# Patient Record
Sex: Male | Born: 1955 | Race: Black or African American | Hispanic: No | Marital: Single | Smoking: Former smoker
Health system: Southern US, Community
[De-identification: ages and names within clinical notes are randomized; demographics above are authoritative.]

## PROBLEM LIST (undated history)

## (undated) DIAGNOSIS — M549 Dorsalgia, unspecified: Secondary | ICD-10-CM

## (undated) DIAGNOSIS — E119 Type 2 diabetes mellitus without complications: Secondary | ICD-10-CM

## (undated) DIAGNOSIS — J189 Pneumonia, unspecified organism: Secondary | ICD-10-CM

## (undated) DIAGNOSIS — Z860100 Personal history of colon polyps, unspecified: Secondary | ICD-10-CM

## (undated) DIAGNOSIS — F319 Bipolar disorder, unspecified: Secondary | ICD-10-CM

## (undated) DIAGNOSIS — K759 Inflammatory liver disease, unspecified: Secondary | ICD-10-CM

## (undated) DIAGNOSIS — E1140 Type 2 diabetes mellitus with diabetic neuropathy, unspecified: Secondary | ICD-10-CM

## (undated) DIAGNOSIS — F329 Major depressive disorder, single episode, unspecified: Secondary | ICD-10-CM

## (undated) DIAGNOSIS — I739 Peripheral vascular disease, unspecified: Secondary | ICD-10-CM

## (undated) DIAGNOSIS — M6282 Rhabdomyolysis: Secondary | ICD-10-CM

## (undated) DIAGNOSIS — R51 Headache: Secondary | ICD-10-CM

## (undated) DIAGNOSIS — M4800 Spinal stenosis, site unspecified: Secondary | ICD-10-CM

## (undated) DIAGNOSIS — N400 Enlarged prostate without lower urinary tract symptoms: Secondary | ICD-10-CM

## (undated) DIAGNOSIS — G8929 Other chronic pain: Secondary | ICD-10-CM

## (undated) DIAGNOSIS — F209 Schizophrenia, unspecified: Secondary | ICD-10-CM

## (undated) DIAGNOSIS — I82409 Acute embolism and thrombosis of unspecified deep veins of unspecified lower extremity: Secondary | ICD-10-CM

## (undated) DIAGNOSIS — F32A Depression, unspecified: Secondary | ICD-10-CM

## (undated) DIAGNOSIS — F419 Anxiety disorder, unspecified: Secondary | ICD-10-CM

## (undated) DIAGNOSIS — R519 Headache, unspecified: Secondary | ICD-10-CM

## (undated) HISTORY — DX: Type 2 diabetes mellitus without complications: E11.9

## (undated) HISTORY — PX: HERNIA REPAIR: SHX51

## (undated) HISTORY — PX: OTHER SURGICAL HISTORY: SHX169

## (undated) HISTORY — PX: COLONOSCOPY: SHX174

## (undated) HISTORY — PX: CARPAL TUNNEL RELEASE: SHX101

## (undated) HISTORY — PX: SHOULDER SURGERY: SHX246

## (undated) HISTORY — DX: Peripheral vascular disease, unspecified: I73.9

## (undated) HISTORY — DX: Acute embolism and thrombosis of unspecified deep veins of unspecified lower extremity: I82.409

## (undated) HISTORY — PX: APPENDECTOMY: SHX54

---

## 2000-02-26 ENCOUNTER — Inpatient Hospital Stay (HOSPITAL_COMMUNITY): Admission: EM | Admit: 2000-02-26 | Discharge: 2000-03-02 | Payer: Self-pay | Admitting: *Deleted

## 2000-05-27 ENCOUNTER — Encounter: Payer: Self-pay | Admitting: Emergency Medicine

## 2000-05-27 ENCOUNTER — Emergency Department (HOSPITAL_COMMUNITY): Admission: EM | Admit: 2000-05-27 | Discharge: 2000-05-27 | Payer: Self-pay | Admitting: Emergency Medicine

## 2000-05-28 ENCOUNTER — Encounter (INDEPENDENT_AMBULATORY_CARE_PROVIDER_SITE_OTHER): Payer: Self-pay | Admitting: *Deleted

## 2000-05-28 ENCOUNTER — Inpatient Hospital Stay (HOSPITAL_COMMUNITY): Admission: EM | Admit: 2000-05-28 | Discharge: 2000-06-04 | Payer: Self-pay | Admitting: Emergency Medicine

## 2000-06-01 ENCOUNTER — Encounter: Payer: Self-pay | Admitting: General Surgery

## 2000-06-02 ENCOUNTER — Encounter: Payer: Self-pay | Admitting: Surgery

## 2005-01-14 ENCOUNTER — Emergency Department (HOSPITAL_COMMUNITY): Admission: EM | Admit: 2005-01-14 | Discharge: 2005-01-14 | Payer: Self-pay | Admitting: Emergency Medicine

## 2005-02-03 ENCOUNTER — Ambulatory Visit: Payer: Self-pay | Admitting: Psychiatry

## 2005-02-03 ENCOUNTER — Inpatient Hospital Stay (HOSPITAL_COMMUNITY): Admission: AD | Admit: 2005-02-03 | Discharge: 2005-02-08 | Payer: Self-pay | Admitting: Psychiatry

## 2005-02-05 ENCOUNTER — Encounter: Payer: Self-pay | Admitting: Psychiatry

## 2005-02-07 ENCOUNTER — Emergency Department (HOSPITAL_COMMUNITY): Admission: EM | Admit: 2005-02-07 | Discharge: 2005-02-07 | Payer: Self-pay | Admitting: Emergency Medicine

## 2005-06-29 ENCOUNTER — Emergency Department (HOSPITAL_COMMUNITY): Admission: EM | Admit: 2005-06-29 | Discharge: 2005-06-29 | Payer: Self-pay | Admitting: Emergency Medicine

## 2005-12-26 ENCOUNTER — Ambulatory Visit: Payer: Self-pay | Admitting: Psychiatry

## 2005-12-26 ENCOUNTER — Inpatient Hospital Stay (HOSPITAL_COMMUNITY): Admission: RE | Admit: 2005-12-26 | Discharge: 2005-12-30 | Payer: Self-pay | Admitting: Psychiatry

## 2006-04-29 ENCOUNTER — Emergency Department (HOSPITAL_COMMUNITY): Admission: EM | Admit: 2006-04-29 | Discharge: 2006-04-29 | Payer: Self-pay | Admitting: Emergency Medicine

## 2006-07-22 ENCOUNTER — Ambulatory Visit: Payer: Self-pay | Admitting: *Deleted

## 2006-07-22 ENCOUNTER — Inpatient Hospital Stay (HOSPITAL_COMMUNITY): Admission: AD | Admit: 2006-07-22 | Discharge: 2006-07-26 | Payer: Self-pay | Admitting: Psychiatry

## 2006-09-21 ENCOUNTER — Emergency Department (HOSPITAL_COMMUNITY): Admission: EM | Admit: 2006-09-21 | Discharge: 2006-09-21 | Payer: Self-pay | Admitting: Emergency Medicine

## 2006-12-02 ENCOUNTER — Emergency Department (HOSPITAL_COMMUNITY): Admission: EM | Admit: 2006-12-02 | Discharge: 2006-12-02 | Payer: Self-pay | Admitting: Family Medicine

## 2006-12-07 ENCOUNTER — Emergency Department (HOSPITAL_COMMUNITY): Admission: EM | Admit: 2006-12-07 | Discharge: 2006-12-07 | Payer: Self-pay | Admitting: Family Medicine

## 2007-04-14 ENCOUNTER — Emergency Department (HOSPITAL_COMMUNITY): Admission: EM | Admit: 2007-04-14 | Discharge: 2007-04-14 | Payer: Self-pay | Admitting: Family Medicine

## 2007-07-19 ENCOUNTER — Emergency Department (HOSPITAL_COMMUNITY): Admission: EM | Admit: 2007-07-19 | Discharge: 2007-07-19 | Payer: Self-pay | Admitting: Emergency Medicine

## 2007-08-02 ENCOUNTER — Ambulatory Visit (HOSPITAL_COMMUNITY): Admission: RE | Admit: 2007-08-02 | Discharge: 2007-08-02 | Payer: Self-pay | Admitting: Family Medicine

## 2007-08-08 ENCOUNTER — Encounter: Admission: RE | Admit: 2007-08-08 | Discharge: 2007-09-05 | Payer: Self-pay | Admitting: Family Medicine

## 2007-08-08 ENCOUNTER — Encounter: Admission: RE | Admit: 2007-08-08 | Discharge: 2007-11-06 | Payer: Self-pay | Admitting: Family Medicine

## 2007-08-23 ENCOUNTER — Ambulatory Visit (HOSPITAL_COMMUNITY): Admission: RE | Admit: 2007-08-23 | Discharge: 2007-08-23 | Payer: Self-pay | Admitting: Family Medicine

## 2007-09-08 ENCOUNTER — Ambulatory Visit: Payer: Self-pay | Admitting: Psychiatry

## 2007-09-08 ENCOUNTER — Inpatient Hospital Stay (HOSPITAL_COMMUNITY): Admission: AD | Admit: 2007-09-08 | Discharge: 2007-09-13 | Payer: Self-pay | Admitting: Psychiatry

## 2007-10-05 ENCOUNTER — Encounter (INDEPENDENT_AMBULATORY_CARE_PROVIDER_SITE_OTHER): Payer: Self-pay | Admitting: Orthopedic Surgery

## 2007-10-05 ENCOUNTER — Inpatient Hospital Stay (HOSPITAL_COMMUNITY): Admission: RE | Admit: 2007-10-05 | Discharge: 2007-10-13 | Payer: Self-pay | Admitting: Orthopedic Surgery

## 2007-10-12 ENCOUNTER — Encounter (INDEPENDENT_AMBULATORY_CARE_PROVIDER_SITE_OTHER): Payer: Self-pay | Admitting: *Deleted

## 2007-10-12 ENCOUNTER — Ambulatory Visit: Payer: Self-pay | Admitting: Cardiology

## 2007-10-12 ENCOUNTER — Ambulatory Visit: Payer: Self-pay | Admitting: Vascular Surgery

## 2007-10-14 ENCOUNTER — Inpatient Hospital Stay (HOSPITAL_COMMUNITY): Admission: EM | Admit: 2007-10-14 | Discharge: 2007-10-20 | Payer: Self-pay | Admitting: Emergency Medicine

## 2007-10-15 ENCOUNTER — Ambulatory Visit: Payer: Self-pay | Admitting: Internal Medicine

## 2007-11-03 ENCOUNTER — Encounter: Payer: Self-pay | Admitting: Infectious Diseases

## 2007-11-08 ENCOUNTER — Ambulatory Visit: Payer: Self-pay | Admitting: Infectious Diseases

## 2007-11-08 DIAGNOSIS — L02419 Cutaneous abscess of limb, unspecified: Secondary | ICD-10-CM

## 2007-11-08 DIAGNOSIS — L03119 Cellulitis of unspecified part of limb: Secondary | ICD-10-CM | POA: Insufficient documentation

## 2007-11-08 LAB — CONVERTED CEMR LAB
AST: 36 units/L (ref 0–37)
Albumin: 4.3 g/dL (ref 3.5–5.2)
Basophils Relative: 1 % (ref 0–1)
CRP: 0.2 mg/dL (ref <0.6)
Calcium: 9.6 mg/dL (ref 8.4–10.5)
Eosinophils Absolute: 0.3 10*3/uL (ref 0.2–0.7)
MCHC: 32.9 g/dL (ref 30.0–36.0)
MCV: 93 fL (ref 78.0–100.0)
Neutrophils Relative %: 43 % (ref 43–77)
Platelets: 189 10*3/uL (ref 150–400)
Sed Rate: 25 mm/hr — ABNORMAL HIGH (ref 0–16)
Sodium: 142 meq/L (ref 135–145)
Total Bilirubin: 0.4 mg/dL (ref 0.3–1.2)
WBC: 7 10*3/uL (ref 4.0–10.5)

## 2007-11-12 ENCOUNTER — Ambulatory Visit (HOSPITAL_COMMUNITY): Admission: RE | Admit: 2007-11-12 | Discharge: 2007-11-12 | Payer: Self-pay | Admitting: Infectious Diseases

## 2007-11-14 ENCOUNTER — Telehealth: Payer: Self-pay

## 2007-11-15 ENCOUNTER — Inpatient Hospital Stay (HOSPITAL_COMMUNITY): Admission: EM | Admit: 2007-11-15 | Discharge: 2007-11-23 | Payer: Self-pay | Admitting: Emergency Medicine

## 2007-11-23 ENCOUNTER — Encounter: Payer: Self-pay | Admitting: Infectious Diseases

## 2007-12-01 HISTORY — PX: CERVICAL SPINE SURGERY: SHX589

## 2007-12-13 ENCOUNTER — Ambulatory Visit: Payer: Self-pay | Admitting: Infectious Diseases

## 2007-12-13 LAB — CONVERTED CEMR LAB
Basophils Relative: 1 % (ref 0–1)
CRP: 0.2 mg/dL (ref <0.6)
Hemoglobin: 14.3 g/dL (ref 13.0–17.0)
Lymphocytes Relative: 45 % (ref 12–46)
MCHC: 33.5 g/dL (ref 30.0–36.0)
Monocytes Absolute: 0.7 10*3/uL (ref 0.1–1.0)
Monocytes Relative: 10 % (ref 3–12)
Neutro Abs: 2.7 10*3/uL (ref 1.7–7.7)
Neutrophils Relative %: 39 % — ABNORMAL LOW (ref 43–77)
RBC: 4.69 M/uL (ref 4.22–5.81)
WBC: 7 10*3/uL (ref 4.0–10.5)

## 2008-01-18 ENCOUNTER — Ambulatory Visit: Payer: Self-pay | Admitting: Infectious Diseases

## 2008-02-20 ENCOUNTER — Telehealth: Payer: Self-pay | Admitting: Infectious Diseases

## 2008-02-21 ENCOUNTER — Emergency Department (HOSPITAL_COMMUNITY): Admission: EM | Admit: 2008-02-21 | Discharge: 2008-02-21 | Payer: Self-pay | Admitting: Emergency Medicine

## 2008-03-21 ENCOUNTER — Telehealth: Payer: Self-pay | Admitting: Infectious Diseases

## 2008-03-28 ENCOUNTER — Ambulatory Visit (HOSPITAL_COMMUNITY): Admission: RE | Admit: 2008-03-28 | Discharge: 2008-03-28 | Payer: Self-pay | Admitting: Infectious Diseases

## 2008-03-29 ENCOUNTER — Telehealth: Payer: Self-pay | Admitting: Infectious Diseases

## 2008-04-12 ENCOUNTER — Encounter: Payer: Self-pay | Admitting: Infectious Diseases

## 2009-03-16 ENCOUNTER — Ambulatory Visit (HOSPITAL_COMMUNITY): Admission: RE | Admit: 2009-03-16 | Discharge: 2009-03-16 | Payer: Self-pay | Admitting: Family Medicine

## 2009-04-15 ENCOUNTER — Inpatient Hospital Stay (HOSPITAL_COMMUNITY): Admission: RE | Admit: 2009-04-15 | Discharge: 2009-04-16 | Payer: Self-pay | Admitting: Neurosurgery

## 2009-05-27 ENCOUNTER — Emergency Department (HOSPITAL_COMMUNITY): Admission: EM | Admit: 2009-05-27 | Discharge: 2009-05-27 | Payer: Self-pay | Admitting: Family Medicine

## 2009-05-30 ENCOUNTER — Other Ambulatory Visit: Payer: Self-pay | Admitting: Emergency Medicine

## 2009-05-31 ENCOUNTER — Inpatient Hospital Stay (HOSPITAL_COMMUNITY): Admission: AD | Admit: 2009-05-31 | Discharge: 2009-06-07 | Payer: Self-pay | Admitting: Psychiatry

## 2009-05-31 ENCOUNTER — Ambulatory Visit: Payer: Self-pay | Admitting: Psychiatry

## 2009-08-19 ENCOUNTER — Encounter: Admission: RE | Admit: 2009-08-19 | Discharge: 2009-08-19 | Payer: Self-pay | Admitting: Orthopedic Surgery

## 2009-09-18 ENCOUNTER — Inpatient Hospital Stay (HOSPITAL_COMMUNITY): Admission: RE | Admit: 2009-09-18 | Discharge: 2009-10-01 | Payer: Self-pay | Admitting: Orthopedic Surgery

## 2009-09-26 ENCOUNTER — Encounter: Payer: Self-pay | Admitting: Orthopedic Surgery

## 2010-04-21 ENCOUNTER — Emergency Department (HOSPITAL_COMMUNITY): Admission: EM | Admit: 2010-04-21 | Discharge: 2010-04-21 | Payer: Self-pay | Admitting: Emergency Medicine

## 2010-12-21 ENCOUNTER — Encounter: Payer: Self-pay | Admitting: Infectious Diseases

## 2010-12-21 ENCOUNTER — Encounter: Payer: Self-pay | Admitting: Psychiatry

## 2011-02-16 LAB — POCT CARDIAC MARKERS
CKMB, poc: <1 ng/mL — ABNORMAL LOW (ref 1.0–8.0)
Myoglobin, poc: 50.5 ng/mL (ref 12–200)
Troponin i, poc: <0.05 ng/mL (ref 0.00–0.09)

## 2011-02-16 LAB — POCT I-STAT, CHEM 8
BUN: 11 mg/dL (ref 6–23)
Creatinine, Ser: 1 mg/dL (ref 0.4–1.5)
Glucose, Bld: 113 mg/dL — ABNORMAL HIGH (ref 70–99)
Potassium: 4.2 mEq/L (ref 3.5–5.1)
Sodium: 139 mEq/L (ref 135–145)

## 2011-02-16 LAB — DIFFERENTIAL
Lymphocytes Relative: 49 % — ABNORMAL HIGH (ref 12–46)
Lymphs Abs: 3.1 10*3/uL (ref 0.7–4.0)
Monocytes Relative: 8 % (ref 3–12)
Neutrophils Relative %: 38 % — ABNORMAL LOW (ref 43–77)

## 2011-02-16 LAB — CBC
HCT: 39.6 % (ref 39.0–52.0)
MCV: 98.4 fL (ref 78.0–100.0)
Platelets: 171 10*3/uL (ref 150–400)
RBC: 4.02 MIL/uL — ABNORMAL LOW (ref 4.22–5.81)
WBC: 6.2 10*3/uL (ref 4.0–10.5)

## 2011-02-24 ENCOUNTER — Other Ambulatory Visit: Payer: Self-pay | Admitting: Orthopedic Surgery

## 2011-02-24 DIAGNOSIS — M545 Low back pain: Secondary | ICD-10-CM

## 2011-02-27 ENCOUNTER — Ambulatory Visit
Admission: RE | Admit: 2011-02-27 | Discharge: 2011-02-27 | Disposition: A | Discharge status: Home / Self Care | Payer: Medicare Other | Source: Ambulatory Visit | Attending: Orthopedic Surgery | Admitting: Orthopedic Surgery

## 2011-02-27 DIAGNOSIS — M545 Low back pain: Secondary | ICD-10-CM

## 2011-03-04 LAB — CBC
Hemoglobin: 9.6 g/dL — ABNORMAL LOW (ref 13.0–17.0)
RBC: 2.78 MIL/uL — ABNORMAL LOW (ref 4.22–5.81)
WBC: 7.6 10*3/uL (ref 4.0–10.5)

## 2011-03-05 LAB — URINALYSIS, ROUTINE W REFLEX MICROSCOPIC
Glucose, UA: NEGATIVE mg/dL
Ketones, ur: NEGATIVE mg/dL
Nitrite: NEGATIVE
Specific Gravity, Urine: 1.02 (ref 1.005–1.030)
pH: 5.5 (ref 5.0–8.0)

## 2011-03-05 LAB — CBC
HCT: 30.2 % — ABNORMAL LOW (ref 39.0–52.0)
HCT: 30.7 % — ABNORMAL LOW (ref 39.0–52.0)
Hemoglobin: 10.4 g/dL — ABNORMAL LOW (ref 13.0–17.0)
Hemoglobin: 10.7 g/dL — ABNORMAL LOW (ref 13.0–17.0)
Hemoglobin: 11.3 g/dL — ABNORMAL LOW (ref 13.0–17.0)
MCHC: 34.4 g/dL (ref 30.0–36.0)
MCHC: 34.5 g/dL (ref 30.0–36.0)
MCHC: 35.1 g/dL (ref 30.0–36.0)
MCV: 100 fL (ref 78.0–100.0)
MCV: 102.5 fL — ABNORMAL HIGH (ref 78.0–100.0)
MCV: 102.7 fL — ABNORMAL HIGH (ref 78.0–100.0)
Platelets: 146 10*3/uL — ABNORMAL LOW (ref 150–400)
Platelets: 181 10*3/uL (ref 150–400)
RBC: 3.07 MIL/uL — ABNORMAL LOW (ref 4.22–5.81)
RBC: 3.13 MIL/uL — ABNORMAL LOW (ref 4.22–5.81)
RBC: 3.36 MIL/uL — ABNORMAL LOW (ref 4.22–5.81)
RBC: 4.02 MIL/uL — ABNORMAL LOW (ref 4.22–5.81)
RDW: 13 % (ref 11.5–15.5)
WBC: 10.8 10*3/uL — ABNORMAL HIGH (ref 4.0–10.5)
WBC: 6 10*3/uL (ref 4.0–10.5)
WBC: 6.7 10*3/uL (ref 4.0–10.5)
WBC: 8.5 10*3/uL (ref 4.0–10.5)

## 2011-03-05 LAB — COMPREHENSIVE METABOLIC PANEL
ALT: 87 U/L — ABNORMAL HIGH (ref 0–53)
AST: 43 U/L — ABNORMAL HIGH (ref 0–37)
AST: 83 U/L — ABNORMAL HIGH (ref 0–37)
Albumin: 3.6 g/dL (ref 3.5–5.2)
BUN: 7 mg/dL (ref 6–23)
CO2: 28 mEq/L (ref 19–32)
CO2: 32 mEq/L (ref 19–32)
Calcium: 9.3 mg/dL (ref 8.4–10.5)
Chloride: 104 mEq/L (ref 96–112)
Chloride: 99 mEq/L (ref 96–112)
Creatinine, Ser: 0.84 mg/dL (ref 0.4–1.5)
GFR calc Af Amer: >60 mL/min (ref >60)
GFR calc Af Amer: >60 mL/min (ref >60)
GFR calc non Af Amer: >60 mL/min (ref >60)
GFR calc non Af Amer: >60 mL/min (ref >60)
Glucose, Bld: 118 mg/dL — ABNORMAL HIGH (ref 70–99)
Sodium: 139 mEq/L (ref 135–145)
Total Bilirubin: 0.7 mg/dL (ref 0.3–1.2)

## 2011-03-05 LAB — BASIC METABOLIC PANEL
BUN: 3 mg/dL — ABNORMAL LOW (ref 6–23)
CO2: 30 mEq/L (ref 19–32)
Calcium: 8 mg/dL — ABNORMAL LOW (ref 8.4–10.5)
Chloride: 104 mEq/L (ref 96–112)
Creatinine, Ser: 0.76 mg/dL (ref 0.4–1.5)
Creatinine, Ser: 0.8 mg/dL (ref 0.4–1.5)
GFR calc Af Amer: >60 mL/min (ref >60)
GFR calc Af Amer: >60 mL/min (ref >60)
GFR calc non Af Amer: >60 mL/min (ref >60)
Sodium: 139 mEq/L (ref 135–145)

## 2011-03-05 LAB — WOUND CULTURE

## 2011-03-05 LAB — DIFFERENTIAL
Basophils Absolute: 0 10*3/uL (ref 0.0–0.1)
Eosinophils Absolute: 0.2 10*3/uL (ref 0.0–0.7)
Eosinophils Relative: 3 % (ref 0–5)
Eosinophils Relative: 3 % (ref 0–5)
Lymphocytes Relative: 31 % (ref 12–46)
Lymphs Abs: 2.3 10*3/uL (ref 0.7–4.0)
Monocytes Absolute: 0.5 10*3/uL (ref 0.1–1.0)
Neutrophils Relative %: 49 % (ref 43–77)

## 2011-03-05 LAB — PROTIME-INR
Prothrombin Time: 12.6 seconds (ref 11.6–15.2)
Prothrombin Time: 13.9 seconds (ref 11.6–15.2)

## 2011-03-05 LAB — URINALYSIS, MICROSCOPIC ONLY
Glucose, UA: NEGATIVE mg/dL
Hgb urine dipstick: NEGATIVE
Ketones, ur: 15 mg/dL — AB
Leukocytes, UA: NEGATIVE
Protein, ur: NEGATIVE mg/dL

## 2011-03-05 LAB — TYPE AND SCREEN
ABO/RH(D): B POS
Antibody Screen: NEGATIVE

## 2011-03-05 LAB — ANAEROBIC CULTURE

## 2011-03-05 LAB — VITAMIN D 25 HYDROXY (VIT D DEFICIENCY, FRACTURES): Vit D, 25-Hydroxy: 21 ng/mL — ABNORMAL LOW (ref 30–89)

## 2011-03-08 LAB — RAPID URINE DRUG SCREEN, HOSP PERFORMED
Amphetamines: NOT DETECTED
Barbiturates: NOT DETECTED
Benzodiazepines: POSITIVE — AB
Opiates: POSITIVE — AB
Tetrahydrocannabinol: NOT DETECTED

## 2011-03-08 LAB — BASIC METABOLIC PANEL
CO2: 30 mEq/L (ref 19–32)
Calcium: 9.1 mg/dL (ref 8.4–10.5)
Creatinine, Ser: 0.82 mg/dL (ref 0.4–1.5)
GFR calc non Af Amer: >60 mL/min (ref >60)
Glucose, Bld: 119 mg/dL — ABNORMAL HIGH (ref 70–99)

## 2011-03-08 LAB — GLUCOSE, CAPILLARY
Glucose-Capillary: 121 mg/dL — ABNORMAL HIGH (ref 70–99)
Glucose-Capillary: 123 mg/dL — ABNORMAL HIGH (ref 70–99)
Glucose-Capillary: 155 mg/dL — ABNORMAL HIGH (ref 70–99)
Glucose-Capillary: 182 mg/dL — ABNORMAL HIGH (ref 70–99)
Glucose-Capillary: 194 mg/dL — ABNORMAL HIGH (ref 70–99)
Glucose-Capillary: 197 mg/dL — ABNORMAL HIGH (ref 70–99)

## 2011-03-08 LAB — CBC
MCHC: 34.4 g/dL (ref 30.0–36.0)
Platelets: 202 10*3/uL (ref 150–400)
RDW: 11.9 % (ref 11.5–15.5)

## 2011-03-08 LAB — DIFFERENTIAL
Basophils Absolute: 0 10*3/uL (ref 0.0–0.1)
Basophils Relative: 1 % (ref 0–1)
Lymphocytes Relative: 48 % — ABNORMAL HIGH (ref 12–46)
Neutro Abs: 2.1 10*3/uL (ref 1.7–7.7)
Neutrophils Relative %: 35 % — ABNORMAL LOW (ref 43–77)

## 2011-03-10 LAB — COMPREHENSIVE METABOLIC PANEL
ALT: 70 U/L — ABNORMAL HIGH (ref 0–53)
Alkaline Phosphatase: 92 U/L (ref 39–117)
CO2: 27 mEq/L (ref 19–32)
Chloride: 105 mEq/L (ref 96–112)
GFR calc non Af Amer: >60 mL/min (ref >60)
Glucose, Bld: 103 mg/dL — ABNORMAL HIGH (ref 70–99)
Potassium: 3.8 mEq/L (ref 3.5–5.1)
Sodium: 140 mEq/L (ref 135–145)
Total Bilirubin: 0.8 mg/dL (ref 0.3–1.2)

## 2011-03-10 LAB — CBC
Hemoglobin: 14.7 g/dL (ref 13.0–17.0)
MCV: 100.1 fL — ABNORMAL HIGH (ref 78.0–100.0)
RBC: 4.17 MIL/uL — ABNORMAL LOW (ref 4.22–5.81)
WBC: 6.3 10*3/uL (ref 4.0–10.5)

## 2011-04-14 NOTE — Op Note (Signed)
NAMEDUC, CROCKET NO.:  [id number]   MEDICAL RECORD NO.:  [id number]          PATIENT TYPE:  INP   LOCATION:  3537                         FACILITY:  [facility]   PHYSICIAN:  Cristi Loron, M.D.DATE OF BIRTH:  1956/07/04   DATE OF PROCEDURE:  04/15/2009  DATE OF DISCHARGE:  04/16/2009                               OPERATIVE REPORT   BRIEF HISTORY:  The patient is a 55 year old black male who suffered  from neck, right shoulder, and arm pain consistent with a right C5-C6  radiculopathy.  He failed medical management and worked up with a  cervical MRI which demonstrated, he had herniated disk/spondylolisthesis  at C4-5, and C5-6.  I discussed various treatment options with the  patient including surgery.  He has weighed the risks, benefits, and  alternatives of the surgery and decided to proceed with the C4-5 and C5-  6 anterior cervical diskectomy and fusion with plating.   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   POSTOPERATIVE DIAGNOSES:  C4-5 and C5-6 spondylosis,s stenosis, cervical  radiculopathy/ bilateral cervicalgia.   PROCEDURE:  C4-5 and C5-6 extensive anterior cervical  discectomy/decompression; C4-5 and C5-6 anterior interbody arthrodesis  with local morcellized autograft bone and Actifuse bone graft extender;  insertion of C4-5 and C5-6 interbody prosthesis (Novel PEEK interbody  prosthesis); C4, C6 anterior cervical plating with Codman SLIM-LOC  titanium plate and screws.   SURGEON:  Cristi Loron, MD.   ASSISTANT:  Stefani Dama, MD.   ANESTHESIA:  General endotracheal.   ESTIMATED BLOOD LOSS:  100 mL.   SPECIMENS:  None.   DRAINS:  None.   COMPLICATIONS:  None.   DESCRIPTION OF PROCEDURE:  The patient was brought to the operating room  by the Anesthesia team.  General endotracheal anesthesia was induced.  The patient  remained in supine position.  A roll  was placed under his  shoulder placing his neck in slight extension.  The anterior cervical  region was then prepared with Betadine scrub and Betadine solution, and  sterile drapes were applied.  I then injected the area to be incised  with Marcaine with epinephrine solution.  I  used a scalpel to make a  transverse incision in the patient's left anterior neck.  I used the  Metzenbaum scissors to divide the platysma muscle and then to dissect  medial to sternocleidomastoid muscle, jugular vein, and carotid artery.  I carefully dissected down towards the anterior cervical spine,  identifying the esophagus and retracting medially.  I then used a  Kittner swabs to clear the soft tissue from the anterior cervical spine  and inserted a bent spinal needle into the exposed intervertebral disk  space.  We obtained intraoperative radiograph to confirm our location.   We then used electrocautery to detach the medial border of the longus  colli muscle bilaterally from C4-5, C5-6 intervertebral disk spaces.  We  then inserted the Caspar self-retaining retractor underneath the longus  colli muscle bilaterally at C4-5, C5-6 to provide exposure.  We began  decompression at C4-5.  We incised  the C4-5 intervertebral disk with a  15-blade scalpel.  We performed a partial intervertebral diskectomy with  pituitary forceps.  We then inserted distraction screws at C4, C5, and  distracted the interspace.  We then used the high-speed drill to  decorticate the vertebral endplates at C4-5 through drill away the  remainder of C4-5 intervertebral disk drill away some posterior  spondylosis and to thin out the posterior longitudinal ligament.  We  incised the thinned out ligament with arachnoid knife and removed it  with Kerrison punch undercutting the vertebral endplates and  decompressing the thecal sac.  We then performed right foraminotomies  about the bilateral C5 nerve root, completing the decompression at  this  level.   We then repeated this procedure in an analogous fashion at C5-6  decompressing at the and C5-6 thecal sac and bilateral C6 nerve roots.   Now, we completed the decompression.  We now turned our attention to the  arthrodesis.  We used trial spacers and determined to use a 6-mm medium  PEEK interbody prosthesis.  We prefilled these prosthesis with a  combination of local morselized autograft bone and Actifuse bone graft  extender.  We then inserted the prostheses distracted interspaces at C4-  5 and C5-6.  There was a good snug fit of the prosthesis at both levels.   We now turned our attention to the anterior spinal instrumentation.  We  used a high-speed drill to remove some better spondylosis from the  vertebral endplates C4-5, C5-6, so that the plate will lay down flat.  We selected an appropriate length of Codman SLIM-LOC anterior cervical  plate, we  laid it along the anterior aspect of the vertebral bodies  from C4-5.  We then drilled two 14-mm holes at C4, C5, and C6.  e  secured the plate to their vertebral bodies by placing two14-mm self-  tapping screws at C4-5, C5-6.  We then obtained an intraoperative  radiograph, which demonstrated good position of the upper plate screws  and interbody prosthesis. We then secured the screws and plate by  locking the cam.   We then obtained hemostasis using bipolar electrocautery.  We irrigated  the wound out with bacitracin solution.  We removed the retractors.  We  then inspected the esophagus for any damage; there was none apparent.  We then reapproximate the patient's platysma muscle with interrupted 3-0  Vicryl suture, subcutaneous tissue with interrupted 3-0 Vicryl suture,  and the skin with Steri-Strips and Benzoin.  The wound was then coated  with bacitracin ointment and sterile dressing was applied.  The drapes  were removed and the patient was subsequently extubated by the  anesthesia team and transported to  postanesthesia care unit in stable  condition.  All sponge, instrument, and needle counts were correct at  the end of the case.      Cristi Loron, M.D.  Electronically Signed     JDJ/MEDQ  D:  04/15/2009  T:  04/16/2009  Job:  [id number]

## 2011-04-14 NOTE — Op Note (Signed)
NAMEHILARIO, [name] NO.:  [id number]   MEDICAL RECORD NO.:  [id number]          PATIENT TYPE:  INP   LOCATION:  5020                         FACILITY:  [facility]   PHYSICIAN:  Nelda Severe, MD      DATE OF BIRTH:  08-09-1956   DATE OF PROCEDURE:  11/15/2007  DATE OF DISCHARGE:                               OPERATIVE REPORT   SURGEON:  Nelda Severe, M.D.   ASSISTANT:  OR staff.   PREOPERATIVE DIAGNOSIS:  Possible wound abscess, right posterior iliac  crest graft wound.   POSTOPERATIVE DIAGNOSIS:  Wound seroma, right posterior iliac crest  wound - infection unlikely.   The patient was placed under general endotracheal anesthesia.  No  antibiotics were given until intraoperative cultures were taken.  The  patient was positioned prone on the [place] frame.  Care was taken in  positioning the upper extremity so as to avoid hyperabduction and  flexion of the shoulders and so as to avoid hyperflexion of the elbows.  The upper extremity was padded with foam from axilla to hands  bilaterally.  The thighs, knees, shins, and ankles were supported on  pillows.  The previous bone graft harvest site incision was marked with  a skin marker.  The skin was prepped with Betadine.  Drapes were applied  in square fashion and secured with Ioban.   The previous skin incision was excised elliptically into the  subcutaneous tissue.  Dissection was carried into the subcutaneous layer  until the posterior iliac crest was identified.  I then with a finger  created a defect at the attachment of the gluteal fascia to the iliac  crest, and serous looking fluid escaped.  About 5-7 mL of this fluid was  sucked up with 10 mL syringe and is being submitted for a white cell  count.   We further detached the gluteal fascia and suctioned out the contents of  the wound which looked very innocuous indeed.  There was a small amount  of fibrinous exudate.  The bony surface was curetted.  A small  amount of  tissue was submitted for histology, probably not quick section.  We also  sent culture swabs, aerobic and anaerobic, from the depths of the wound  as well as a small amount of tissue was placed in two other culture  viles for aerobic and anaerobic culture.  Subsequent to taking the  cultures, the patient was started on vancomycin and Zosyn per the  request of Dr. Orvan Falconer, infectious disease consultant seeing the  patient.   We then irrigated 3 liters of normal saline using pulsatile lavage  through the wound.  A 15-gauge Blake drain was placed subfascially and  brought out through the skin proximally and secured with a 2-0 nylon  suture in basket weave fashion.  The gluteal fascia was then reapposed  to the iliac crest using two figure-of-eight #1 Vicryl sutures.  The  subcutaneous tissue was closed using a combination of interrupted and  continuous 2-0 Vicryl sutures.  The skin was closed with staples.  A  bacitracin ointment dressing was then  applied and secured with tape.  There were no  intraoperative complications.  The sponge and needle counts were  correct.  Blood loss was estimated at about 100 mL.   The patient was awakened and brought to the recovery room where he is  awake and moving all extremities.      Nelda Severe, MD  Electronically Signed     MT/MEDQ  D:  11/17/2007  T:  11/18/2007  Job:  [id number]

## 2011-04-14 NOTE — Consult Note (Signed)
NAMEJERIKO, [name] NO.:  [id number]   MEDICAL RECORD NO.:  [id number]          PATIENT TYPE:  INP   LOCATION:  5028                         FACILITY:  [facility]   PHYSICIAN:  Richard Sell, MD DATE OF BIRTH:  [birth year]   DATE OF CONSULTATION:  10/14/2007  DATE OF DISCHARGE:                                 CONSULTATION   REFERRING PHYSICIAN:  Nelda Severe, MD   REASON FOR CONSULTATION:  Thigh abscesses post lumbar fusion procedure  on October 05, 2007.   HISTORY OF PRESENT ILLNESS:  This is a pleasant 55 year old African  American male with a history of medication induced diabetes as well as  bipolar disorder who underwent posterior lumbar fusion with laminectomy  and iliac crest bone graft by Dr. Nelda Owen on October 05, 2007.  He  apparently did relatively well postoperatively but did have fevers and a  mild leucocytosis which was attributed to a possible pneumonia and  treated with vancomycin and moxifloxacin and then discharged on p.o.  moxifloxacin.  He was just discharged yesterday but reports that after  he went home, he started to have increasing pain in his right thigh.  Got to the point where this morning he could not get out of bed due to  the pain and weakness.  He also reports fevers last night, although he  did not take his temperatures.  He denies any shaking, chills or night  sweats.  He denies any chest pain, shortness of breath or cough.  Prior  to surgery, he says he was in his usual state of health and was able to  walk relatively well.  His initial need for this surgery was due to a  fall in August 2008.   Patient was seen in the emergency room and had a CT of his thigh which  showed some abscesses.  He then had an MRI which showed multiple small  abscesses.  He was not given any antibiotics since admission but was  discharged yesterday on moxifloxacin, which he presumably was still  taking.  He denies ever having any other  infection such as this.   PAST MEDICAL HISTORY:  1. Drug induced diabetes, which is relatively well controlled with the      most recent hemoglobin A1c of 6.5.  2. Bipolar.  3. Status post appendectomy.  4. Status post surgical fixation of his right shoulder following a      football injury many years ago.   SOCIAL HISTORY:  Patient lives with is wife.  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  He has two children.   FAMILY HISTORY:  Noncontributory.   MEDICATION AS AN OUTPATIENT:  He was discharged yesterday on Norco,  Robaxin, Avelox, Glucotrol, Lasix.   ALLERGIES:  THE PATIENT IS ALLERGIC TO PENICILLIN, SAYS HE GETS A RASH  FROM IT, BUT DOES NOT HAVE ANAPHYLAXIS.   PHYSICAL EXAMINATION:  VITAL SIGNS:  Patient is afebrile at this point  with a temperature of 97.8.  reviewing his data from his last  hospitalization, he was last febrile on November  11 at 100.2.  Prior to  that, he had temperatures up to 101.9 on November 7, 8 and 9.  Currently  his blood pressure is 111/63, heart rate of 103 to 95, respirations of  20, saturating 98% on room air.  GENERAL:  He is a well appearing African American gentleman in no acute  distress.  He does seem to be resting on his left side so as to not put  pressure on his right hip.  HEENT:  His pupils are equal, round and reactive to light and  accommodation.  Extraocular movements are intact.  Sclerae are  anicteric.  Oropharynx clear.  There is no thrush noted.  NECK:  Supple.  HEART:  Regular.  LUNGS:  Clear to auscultation bilaterally.  ABDOMEN:  Soft, nontender, nondistended, no hepatosplenomegaly.  EXTREMITIES:  He does have some induration and swelling over his right  lateral thigh.  There is some warmth but no spreading erythema.  It is  tender to deep palpation and tender with range of motion.  He has on his  right buttock approximately a 5-cm incision which is draining some  serosanguineous slightly yellowish fluid, which was  swabbed.  He has a  well approximated, well healing incision in his midline back.  He as 1+  edema on his right lower extremity.  MUSCULOSKELETAL:  His joints are without effusion, warmth or tenderness.   DATA:  Patient had a white count of 10.8, hemoglobin 9.8, platelets 423.  His A1c is 6.5.  His BUN is 7.  Creatinine 0.82.  LFTs are relatively  within normal limits with a slight increase in his AST and ALT.  Blood  culture done November 7 and November 8 are negative during culture.  November 5 are negative.   He had imaging done of his right thigh with a CT scan done in the  emergency room last night that revealed an abscess in the right thigh.  This was approximately 2.5 x 1.3 x 5.9 cm in the right gluteus medius  muscle.  There was no evidence of a DVT on this.  He then had an MRI  done, the report of which is not available but which apparently showed  multiple small abscesses tracking along the thigh.   ASSESSMENT:  This is a pleasant 55 year old gentleman with a past  medical history of drug induced diabetes from Seroquel as well as  bipolar disorder who has surgery November 5 on his lumbar spine with a  harvest from his right iliac crest for bone grafting.  He did relatively  well postoperatively, although he did have high fevers to 101.9 and was  treated with vancomycin and Avelox.  He was discharged yesterday on  Avelox and now presents with right thigh pain and his found to have  multiple small abscesses.  There is also some drainage from his right  iliac crest bone harvest site.  I think this is most likely a contiguous  infection, although the MRI does not necessarily confirm that there may  be some tracking along fascial planes.  I have swabbed his incision site  and sent it for culture.  He is going now for VIR ultrasound guided  aspirate of one of the abscesses.   My recommendations would be:  1. Send aspirate for anaerobic and aerobic culture.  2. Start him  empirically on vancomycin and imipenem as he has a      penicillin allergy which is a rash.  Based on followup of the  culture, we can narrow antibiotics.  3. He will likely need repeat imaging at some point after a period of      antibiotics in order to demonstrate resolution of these abscesses.  4. If the patient is febrile, I would recommend blood cultures of him      as well as UA and urine culture and chest x-ray, although he is not      febrile at this time.   Thank you for the consult.  We would be glad to follow along with you.  He will be seen by Dr. Orvan Falconer over the weekend.      Richard Sell, MD  Electronically Signed     DPF/MEDQ  D:  10/14/2007  T:  10/15/2007  Job:  [id number]

## 2011-04-14 NOTE — Consult Note (Signed)
NAMEJENNINGS, [name] NO.:  [id number]   MEDICAL RECORD NO.:  [id number]          PATIENT TYPE:  INP   LOCATION:  5020                         FACILITY:  [facility]   PHYSICIAN:  Jamison Neighbor, M.D.  DATE OF BIRTH:  08-Oct-1956   DATE OF CONSULTATION:  11/20/2007  DATE OF DISCHARGE:                                 CONSULTATION   REASON FOR CONSULTATION:  Bladder outlet symptoms.   HISTORY:  This 55 year old black male was admitted to the hospital on  November 15, 2007, for the treatment of a wound abscess involving a  right posterior iliac crest graft wound.  The patient was taken to the  operating room where it turned out that this was most likely a seroma.  The patient has been in the hospital receiving wound care including  antibiotic therapy.  The patient notes that he has been having some  problems with frequency of urination and requested urologic  consultation.   The patient notes that recently he has developed some problems with  initiation of urination.  He says he has a hard time getting his urinary  stream to start.  He does have some hesitancy as well as some double  voiding.  He has decrease in the force of his stream and some dribbling.  There is nocturia.  There is no dysuria, hematuria, or documented  infections.   The patient's past urologic history is unremarkable.  He has not had  previous urologic evaluation.  He says prior to this point he really did  not have a lot of urologic problems.  He feels that this did come on him  somewhat quickly, but says that he certainly does agree that it is  possible that this may have been building for a little bit longer than  he realized.   PAST MEDICAL HISTORY:  Remarkable for bipolar disorder, diabetes, and  hepatitis C.   MEDICATIONS:  1. Depakote ER 500 mg.  2. Glipizide XL 10 mg.  3. Dilaudid.  4. Norco for postoperative pain.  5. Zosyn.  6. He also has received Vancomycin as recommendation per  infectious      disease.   SOCIAL HISTORY:  Pertinent for tobacco use.   FAMILY HISTORY:   REVIEW OF SYSTEMS:  Noncontributory.   PHYSICAL EXAMINATION:  GENERAL:  He is a well-developed, well-nourished,  male in no acute distress.  He is alert and oriented x3.  HEENT:  Normocephalic and atraumatic.  Cranial nerves II-XII grossly  intact.  LUNGS:  Respirations are unlabored.  ABDOMEN:  Soft and nontender with no palpable masses, rebound, or  guarding.  He has a small umbilical hernia which is a little bit tender  and I did recommend he consider seeing general surgery to have that  fixed.  GENITOURINARY:  Normal testicles bilaterally with no hydrocele,  somatocele, varicocele, hernia, or adenopathy.  Penis is free of any  lesions.  Meatus is normal.  EXTREMITIES:  No cyanosis, clubbing, or edema.  NEUROLOGY:  Appears to be grossly intact.  The patient does have an  abscessed area on the  right hip that is healing and is currently  dressed.  I did not take the dressing down.  RECTAL:  2+ prostate without evidence of active prostatitis.   I am going to start him on Flomax 0.4 mg p.o. q.h.s.  I told him it will  take a week or two for this to start to work.  He will need evaluation  with cystoscopy and urodynamics in my office and we can see him back in  follow-up about two weeks post discharge.      Jamison Neighbor, M.D.  Electronically Signed     RJE/MEDQ  D:  11/20/2007  T:  11/21/2007  Job:  [id number]

## 2011-04-14 NOTE — Op Note (Signed)
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   PROCEDURES:  1. Transforaminal lumbar interbody fusion (L4-5)  2. Posterolateral fusion L4-5.  3. Pedicle screw instrumentation L4-5.  4. Insertion of interbody cage L4-5.  5. Harvest posterior iliac crest graft right side.   SURGEON:  Nelda Severe, MD.   ASSISTANT:  Lianne Cure, P.A.C.   DESCRIPTION OF PROCEDURE:  Patient was placed under general endotracheal  anesthesia.  Foley catheter was inserted in the bladder.  Electrodes for  neurologic monitoring were attached to the upper and lower extremities  on the scalp.  Patient was positioned prone on a Jackson frame.  Care  was taken to avoid hyperabduction of the shoulders and hyperflexion of  the shoulders as well as hyperflexion of the elbows.  The upper  extremities were padded with foam from axillae to hands.  The hips were  relatively extended and the thighs, knee, shins and feet supported on  pillows.  Hair was clipped from the lumbar area.  The lumbar area was  prepped with DuraPrep and draped in rectangular fashion.  The drapes  were secured with Ioban.  Time out was performed to positively identify  the patient and verify the procedure as outlined above.  Midline  incision was made into the dermis.  The subcutaneous tissue and  paraspinal fascia was injected with a mixture of 25% Marcaine plain,  with 1% lidocaine with epinephrine.  Dissection was carried down with  cutting current onto the spinous processes.   Paraspinal muscles were  mobilized bilaterally.  Cross table lateral radiograph was taken with a  Kocher on the L4 spinous process.  This conformed to our impression of  the level at which we were.  Exposure was laterally to the tips of the  transverse process at L4 and L5.   Pedicle holes were created bilaterally at L4 and L5 as follows:  A piece  of the base of the superior articular process was removed with a Leksell  rongeur, the pedicle was perforated with an awl, a hole was made through  the pedicle into the vertebral body with a 3.5 mm drill bit, the hole  was carefully probed with a ball-tip probe to make sure that it was  circumferentially intact, the hole was sounded for depth and the depth  recorded, the hole was injected with Floseal and a radiopaque marker  placed.  Subsequently, we ordered a radiograph and then turned our  attention to harvesting a bone graft in the right posterior iliac crest.   An incision was made just lateral to the right posterior iliac crest.  It was carried down to the gluteal fascia.  The gluteal fascia was  detached from the outer table of the ileum and the muscle elevated off  the outer table of the ileum.  A Taylor retractor was placed.  A  moderate quantity of bone graft was harvested using acetabular reamer as  well as gouges.  Raw bleeding bone was then smeared with Gelfoam to  control bleeding.  The gluteal fascia was then reattached to the lumbar  fascia and iliac crest using three #1 Vicryl sutures in figure-of-eight  fashion.   By this time, the radiograph had been performed and the image was  displayed.  It showed satisfactory position of the radiopaque markers.  We then placed the screws bilaterally at L4 and L5.  Screws were all  stimulated and distal EMG activity recorded, in every instance at a safe  level, meaning that there is unlikely contact between a screw thread and  nerve root.   Prior to placing the screws on either side,  the transverse processes  were decorticated and cancellous packed posterolaterally.   Subsequent to the screw placement, we then performed a right-sided  superior and inferior facetectomy at L4-5 and removed ligamentum flavum  to expose the dura and descending L5 nerve root.  The exiting L4 nerve  root was protected with a cottonoid patty and subsequently a D'Errico  retractor and the proximal portion of the L5 nerve root retracted  medially with a Love nerve root retractor.  This is being done on the  right side.  The disk was fenestrated and then a series of disk scrapers  inserted to loosen up and deliver degenerate nucleus.  This was  delivered with pituitary rongeurs.  Subsequently we put a 12 mm  intradiskal distractor in place and attached provisionally a working  rod between L4 and 5 to hold the interspace distracted.  Subsequently,  it was further distracted using an external distractor between the  spinous processes of L4 and 5.   Thorough curettage of the disk space was carried out.  When I felt the  disk space had been adequately prepared, we packed cancellous bone graft  anteriorly.  We then loaded a TLIF implant, 12 mm in thickness, with  bone graft and inserted it without difficulty transversely in the disk  space.  Distraction was then let off.   I then prepared the lamina at L4 and L5 on the left side for bone  grafting and resected the articular surfaces of the L4-5 facet joint on  the left side.  Bone graft was then packed into this area including  locally harvested graft from the right-sided laminectomy and  facetectomy.  Bone graft that was left over was then packed  posterolaterally on the right side at L4-5.   Cross table lateral radiographs showed satisfactory position of  implants.  The construct was compressed on either side and the set  screws tightened and eventually torqued.  We checked to make sure that  the rod was protruding satisfactorily beyond the  coupling at each level,  that there was adequate purchase of the rod.   We then placed a 15-gauge Blake drain subfascially and brought it out  through the skin to the right side.  This Blake drain was secured with a  2-0 nylon suture in basket-weave fashion.  The lumbar fascia was then  closed using continuous #1 Vicryl suture.  We then placed a one inch  Hemovac drain from the central lumbar wound,  out through the bone graft  harvest wound to the right side through the skin proximally.  This drain  was also secured with a 2-0 nylon suture in basket-weave fashion.  The  subcutaneous layer of both wounds was closed using interrupted inverted  2-0 Vicryl suture.  The skin of both wounds was closed using continuous  subcuticular 3-0 undyed Vicryl suture.  The skin edges were then  reinforced with Steri-Strips.  A nonadherent dressing was then applied  to both wounds.   The patient was then transferred to his bed and awakened and transferred  to the recovery room.   In the recovery room, he was able to actively dorsiflex and plantar flex  both feet and ankles.   There were no intraoperative complications.  Sponge and needle counts  were correct.  Blood loss estimated at 250 mL.   Not mentioned above, is that toward the end of the procedure we  attempted to inject intrathecally at L3-4 and at L4-5 preservative-free  morphine, but at neither level was I able to get efflux of spinal fluid,  so the injection was not performed and the medication was wasted and I  believe this was recorded by the nurses.      Nelda Severe, MD  Electronically Signed     MT/MEDQ  D:  10/05/2007  T:  10/05/2007  Job:  [id number]

## 2011-04-14 NOTE — H&P (Signed)
NAMERICKI, CLACK NO.:  [id number]   MEDICAL RECORD NO.:  [id number]          PATIENT TYPE:  IPS   LOCATION:  0407                          [facility]ACILITY[facility]  [facility]   PHYSICIAN:  Anselm Jungling, MD  DATE OF BIRTH:  [birth year]   DATE OF ADMISSION:  09/08/2007  DATE OF DISCHARGE:                       PSYCHIATRIC ADMISSION ASSESSMENT   IDENTIFYING INFORMATION:  This is a 55 year old African-American male  who is single.  This is a voluntary admission.   HISTORY OF PRESENT ILLNESS:  This patient was referred by [facility] where he presented complaining of some increase in  auditory hallucinations over the course of about one week, having some  command hallucinations along with some increasing irritability and  worsening of mood.  His sleep was decreased to 3-4 hours at a time which  is unlike his usual six hours per night.  e had also been experiencing  some paranoia, guarding, beginning to feel suspicious about people.  Two  days prior to admission, he had met with his psychiatrist, Dr. Hortencia Pilar,  who he reports was discontinuing his Abilify and going to taper down his  Depakote in favor of using Seroquel as a sole medication therapy.  He  also reports some increased difficulties since July with some chronic  low back pain after stepping into a hole and wrenching his right knee  and lower back.  Today, he is alert, oriented, pleasant and cooperative  and having some low, almost inaudible, hallucinations today with no  commands.   PAST PSYCHIATRIC HISTORY:  The patient is followed at [facility] by Dr. Homero Fellers and [facility].  He has a history  of schizoaffective disorder, bipolar-type, and history of cocaine  dependence and he reports now abstinent from cocaine less than a year  but cannot specify the number of months.  This is his third admission to  [facility].  Prior admissions here under  the  service of Dr. Aleatha Borer February 03, 2005 to February 08, 2005.  He also  has a history of prior admissions to [facility].  Does have a  history of mania in the distant past.  Reports he has not had a manic  episode in more than two years at this point.  He endorses drinking some  alcohol, last drank one beer two weeks ago.  Medications in the past  include Abilify, Depakote, Benadryl, Lithobid, Risperdal and Cogentin in  managing his schizoaffective disorder.   SOCIAL HISTORY:  Single African-American male in no distress.  Currently  living alone in a house that his mother left him.  He is satisfied with  his living situation.  He is alone but does have a girlfriend in a  stable relationship.  He has Medicaid and Medicare as resources to pay  for care.  He is unemployed and on disability.   PAST MEDICAL HISTORY:  The patient is newly transferred on the Medicaid  program to the services of Dr. Fleet Contras at the [facility] here in  [place].  The patient has not  yet seen him.  Medical problems include  a history of hepatitis C with elevated liver function tests and low back  pain with some radiation to his posterior thighs and down the right leg  following an injury in July of 2008.  Past medical history includes an  episode of kidney stones in the past, some chronic back pain.   MEDICATIONS:  Depakote ER 500 mg in the morning, 1000 mg at h.s.,  Abilify dose unclear and Seroquel 200 mg tablet, 1/2 daily and 1-2 at  bedtime to start with Abilify being discontinued.   ALLERGIES:  PENICILLIN.   REVIEW OF SYSTEMS:  This is a well-nourished, generally healthy-  appearing gentleman who is in a wheelchair this morning complaining of  some dizziness after receiving a 200 mg dose of Seroquel this morning.  CONSTITUTION:  No fever, no chills.  Reports his weight is stable.  Appetite is good.  Sleep is changed as previously noted.  ENDOCRINE:  The patient reports no changes  in skin or hair.  No problems with  temperature intolerance.  RESPIRATORY:  No shortness of breath.  No  wheeze.  No post nocturnal dyspnea and no shortness of breath on  exertion.  GI:  No acid reflux.  No dyspepsia.  Bowels are regular  without laxatives.  He has had some episodes of intermittent loss of  stool control since the back injury and his orthopedist is aware of  this.  GENITOURINARY:  No nocturia.  Has had some loss of urinary  continence with aggravation of the back pain.  This is intermittent.  MUSCULOSKELETAL:  No body aches.  NEURO:  No blackouts.  No difficulty  with balance.  Denies any muscle tension.  No akathisia.  No symptoms of  EPS.   PHYSICAL EXAMINATION:  VITAL SIGNS:  Height 6 feet 1 inch tall, weight  203 pounds, temperature 98.2, pulse 80, respirations 18, blood pressure  146/76.  O2 sat at 97%.  He is a smoker, smoking about one pack per day.  GENERAL:  Well-nourished, well-developed with fair complexion.  HEAD:  Normocephalic and atraumatic.  He is wearing a full beard.  Hygiene is good.  Dress is appropriate.  HEENT:  No thyromegaly.  No lymphadenopathy.  NECK:  Supple.  No carotid bruits heard.  CHEST:  Clear to auscultation.  BREASTS:  Exam deferred.  CARDIOVASCULAR:  S1-S2 is heard.  No clicks, murmurs, gallops or extra  sounds, synchronous with radial pulse.  ABDOMEN:  Soft, nontender, nondistended.  Bowel sounds in all four  quadrants.  PELVIC:  Deferred.  GENITOURINARY:  Deferred.  EXTREMITIES:  Pulses are 2+.  No edema.  SKIN:  Intact.  NEUROLOGIC:  Cranial nerves 2-12 are intact.  Palate rise is  symmetrical.  Motor and sensory are intact.  Romberg is deferred and no  focal findings.   LABORATORY DATA:  CBC revealed WBC 6.8, hemoglobin 13.7, hematocrit  40.9, platelets 163,000.  Chemistries all within normal limits.  BUN 13,  creatinine 1.44, random glucose 153.  Liver enzymes are elevated with  SGOT 94, SGPT 137, alkaline phosphatase 74  and total bilirubin is 0.8,  magnesium 2.2.  Urinalysis within normal limits.  Urine drug screen  pending.  His TSH is 2.224 and valproate 40.5.   MENTAL STATUS EXAM:  Fully alert male, pleasant and cooperative, good  eye contact.  Articulate speech, deliberate.  Registration is good.  Answers are thought out.  Mood is euthymic.  Thought process logical,  coherent.  Does not appear to be internally stimulated today although he  does complain of hearing some low hallucinations and having some optical  illusions, looking at the carpeting.  Denies suicidal thoughts today.  Cognition is completely preserved.  Comprehension and speech expression  are intact.  Memory is intact.  Impulse control and judgment are within  normal limits.   DIAGNOSES:  AXIS I:  Schizoaffective disorder, bipolar-type.  Cocaine  abuse by history.  AXIS II:  Deferred.  AXIS III:  Hepatitis C and low back pain.  AXIS IV:  Deferred.  AXIS V:  Current 30; past year 30.   PLAN:  To voluntarily admit the patient.  We have put him in our  intensive care unit because of his recent hallucinations.  Our goal is  to alleviate his suicidal thought and control his hallucinations and  psychotic symptoms.  We will continue with Dr. Hortencia Pilar with a plan of  decreasing his Depakote to 1000 mg p.o. q.h.s. and have started him on  200 mg of Seroquel at night and the 100 mg q.6h. p.r.n. during the day.  His urine drug screen is pending.  He is going to contact Dr. Eula Listen, his orthopedist, about his  pain control medications for his low back pain.  He has had no more  episodes at this point of urinary or bowel incontinence but we will  monitor him.   ESTIMATED LENGTH OF STAY:  Five days.      Margaret A. Lorin Picket, N.P.      Anselm Jungling, MD  Electronically Signed    MAS/MEDQ  D:  09/09/2007  T:  09/09/2007  Job:  [phone]

## 2011-04-14 NOTE — Discharge Summary (Signed)
NAMENAMEER, SUMMER NO.:  [id number]   MEDICAL RECORD NO.:  [id number]          PATIENT TYPE:  INP   LOCATION:  5020                         FACILITY:  [facility]   PHYSICIAN:  Nelda Severe, MD      DATE OF BIRTH:  08/23/1956   DATE OF ADMISSION:  11/15/2007  DATE OF DISCHARGE:  11/23/2007                               DISCHARGE SUMMARY   FINAL DIAGNOSIS:  Resolved infection right posterior iliac crest bone  graft harvest.   DISCHARGE INSTRUCTIONS:  Continue to avoid bending and lifting, may  shower, medication as instructed.   DISCHARGE MEDICATIONS:  1. Depakote 500 mg daily.  2. Glucotrol 10 mg daily.  3. Robaxin 500 mg one to two q.6h. p.r.n.  4. Hydrocodone/acetaminophen (10/325) one q.4h. p.r.n., maximum six      per day.  5. Doxycycline 100 mg p.o. b.i.d. for 3 weeks.  6. Flomax 0.8 mg q.h.s. x1 month.   FOLLOWUP APPOINTMENTS:  1. Dr. Logan Bores, urology, 2 weeks.  2. Infectious disease clinic 2 weeks.  3. Dr. Nelda Severe 5-7 days.   COURSE IN HOSPITAL:  The patient was admitted into the hospital because  of a fluid collection in a posterior iliac crest wound harvest site  associated with severe pain.  He was seen in consultation by the  infectious disease service.  He had a workup including MRI scan which  showed the above-mentioned collection.  His CRP was 0.3 and his  erythrocyte sedimentation rate was 25.  He was taken to the operating  room on November 17, 2007, where a very bland-looking collection of  fluid was drained.  The wound was irrigated and debrided as necessary.  Cultures were subsequently negative, but apparently were not plated out  for many hours after they were gathered.  Wound fluid which was  aspirated clotted before it was sent to the lab.  The wound was closed  over suction drainage which was discontinued after about 3 days.  He  developed obstructive urinary symptoms and was seen in consultation by  Dr. Logan Bores, urologist.  He  was placed on Flomax and Urecholine.  He is to  see Dr. Logan Bores in the office in approximately 2 weeks.   At the time of discharge his incision is healing well.  Staples are in  situ.  There is no swelling, drainage, erythema.  He is ambulatory.  His  pain level was better.   He will be seen by me in the next 5-7 days in the office.  Other  followup appointments are as indicated above.  He will continue to  observe his no bending/lifting rules for his lumbar fusion which in and  of itself has not been problematic.      Nelda Severe, MD  Electronically Signed     MT/MEDQ  D:  11/23/2007  T:  11/23/2007  Job:  [id number]

## 2011-04-14 NOTE — Discharge Summary (Signed)
[name], [name] NO.:  [id number]   MEDICAL RECORD NO.:  [id number]          PATIENT TYPE:  IPS   LOCATION:  0407                          [facility]ACILITY:  [facility]   PHYSICIAN:  Jasmine Pang, M.D. DATE OF BIRTH:  05/02/1956   DATE OF ADMISSION:  05/31/2009  DATE OF DISCHARGE:  06/07/2009                               DISCHARGE SUMMARY   IDENTIFICATION:  This is a 55 year old divorced African American male  who was admitted on a voluntary basis on May 31, 2009.   HISTORY OF PRESENT ILLNESS:  The patient presented to the emergency  department.  He stated he had been hearing voices since Friday.  He was  upset upon assessment as he had been in the ER awaiting ACT team  evaluation since 9 a.m.  He states that the voices have been getting  worse during the assessment.  He could barely hear above the voices he  says.  He stated that he was fearful, they will command in nature as  they have in the past and someone might get hurt.  [name] was with  [place] back in October 2008, he was treated at this time for this same sort  of situation.  He was having increasing irritability, feeling paranoid  with auditory hallucinations.  He was stabilized and discharged at that  time on Depakote ER 1000 mg h.s. with and Seroquel 200 mg.  He has been  a long-term client of the [facility] and is distressed to Dr.  Geraldo Docker retirement.  For further admission information, see  psychiatric admission assessment.  Initially, an axis I diagnosis of  schizoaffective disorder was given, and on axis III, he has active  bronchitis.  He is still under treatment.   PHYSICAL FINDINGS:  There were no acute physical or medical problems  noted.   ADMISSION LABORATORY:  UDS was positive for opiates and benzodiazepines.   HOSPITAL COURSE:  Upon admission, the patient was started on ibuprofen  800 mg p.o. t.i.d., Tylenol with Codeine Elixir 3 mL/10 g q.4 h. p.r.n.,  erythromycin 250 mg p.o.  q.i.d., Percocet 08/324 mg p.o. 1 q.4 h p.r.n.  He was also started on trazodone 100 mg p.o. q.h.s., and he was given a  cervical neck brace due to his disc repair in his neck.  On June 01, 2009, Tylenol with Codeine was discontinued since the patient was on  Percocet.  He was also started on Robitussin DM 10 mL q.6 hours, p.r.n.  cough, and benzonatate 100 mg p.o. t.i.d. p.r.n. cough.  In individual  sessions, the patient was initially complaining of voices that are  almost louder than the counselors.  He stated that at this point the  voices usually become command type, and he was concerned that someone  will get hurt if the voices get worse.  On June 01, 2009, he was started  on Depakote ER 500 mg p.o. b.i.d. and Seroquel 200 mg p.o. q.h.s.  Also,  the trazodone was increased to 150 mg p.o. q.h.s.  As his  hospitalization progressed, the patient was depressed  and anxious on  and off.  He continued to have auditory hallucinations, but they are  decreased.  He was hearing background mumbo jumbo.  He also stated he  was paranoid of others at times, he was isolative and was reserved.  He  stayed in his room.  There was a positive thought.  On June 05, 1009, he  was less depressed, less anxious.  He discussed the conflict with the  peer the night before and was upset about this.  He continued be quiet  reserved and isolative, but began to talk about wanting to go home soon.  On June 07, 2009, mental status had improved markedly from admission  status.  Sleep was good, appetite was good.  Mood was less depressed,  less anxious.  Affect was consistent with mood.  There was no suicidal  or homicidal ideation.  No thoughts of self-injurious behavior.  No  auditory or visual hallucinations.  No paranoia or delusions.  Thoughts  were logical and goal-directed.  Thought content, no predominant theme.  Cognitive was grossly intact.  Insight good and judgment good.  Impulse  control good.  It was felt  the patient was safe for discharge today.   HOSPITAL COURSE:  Axis I:  Schizoaffective disorder, depressed mood.  Axis II:  None.  Axis III:  Recent bronchitis.  Axis IV:  Moderate (mostly related to primary support issues.  Also,  burden of psychiatric illness).  Axis V:  Global assessment of functioning was 50 upon discharge.  GAF  was 35 upon admission.  GAF highest past year was 60 was 55-60.   DISCHARGE PLAN:  There was no specific activity level or dietary  restrictions.   POSTHOSPITAL CARE PLANS:  The patient will go to [facility] treatment on July 11, 2009, at 2 o'clock.   DISCHARGE MEDICATIONS:  The patient is to finish his erythromycin  prescription, and was given a prescription for this.  Other discharge  medications include;  1. Depakote ER 500 mg twice a day.  2. Seroquel 200 mg at bedtime.  3. Trazodone 150 mg at bedtime.  4. He is also to resume Percocet as directed by his family physician.  5. Celebrex as directed.  6. Ibuprofen as directed.      Jasmine Pang, M.D.  Electronically Signed     BHS/MEDQ  D:  06/07/2009  T:  06/08/2009  Job:  [id number]

## 2011-04-14 NOTE — Discharge Summary (Signed)
NAMEDEYTON, ELLENBECKER NO.:  [id number]   MEDICAL RECORD NO.:  [id number]          PATIENT TYPE:  INP   LOCATION:  5028                         FACILITY:  [facility]   PHYSICIAN:  Nelda Severe, MD      DATE OF BIRTH:  Aug 13, 1956   DATE OF ADMISSION:  10/14/2007  DATE OF DISCHARGE:                               DISCHARGE SUMMARY   REASON FOR ADMISSION:  Thigh pain, decreased mobility.   OTHER ADMITTING DIAGNOSIS:  Status post lumbar fusion six days prior to  this admission.  Lumbar fusion was done on October 05, 2007.   HISTORY:  This gentleman came into the emergency room on October 14, 2007 secondary to decreased mobility, pain and swelling in his right hip  and thigh.  He had done well with his posterior lumbar fusion, was  discharged home on October 13, 2007.  He had been on vancomycin and  moxifloxacin while in the hospital and discharged home on p.o.  moxifloxacin.  A CT scan was done in the emergency room and showed  abscesses in the thigh.  To better delineate these, we had an MRI with  contrast showing multiple small abscesses not tracking towards the  posterior surgery sites or the posterior hip bone graft site.  He denies  having any other infections recently.  Once admitted, we did have an  infectious disease consultation by Dr. Stann Mainland. Sampson Goon; the  consultation was done on October 14, 2007.  Recommendations were to  send for exploration by radiology under ultrasound guidance and to get  anaerobic and aerobic cultures to better delineate the source of the  abscesses and started him empirically on vancomycin and imipenem; HE  DOES HAVE A PENICILLIN ALLERGY, and then we will follow and change based  on cultures and sensitivities.  We also had primary care physician  hospitalist follow secondary to onset of diabetes to help control blood  sugar levels while he is here and make adjustments to his medications as  needed.  We also placed him on a PCA  for pain control.  On October 14, 2007, the MRI was reviewed by Dr. Nelda Severe and he is in agreement  with consulting infectious disease, Dr. Sampson Goon, on plan.  During  this stay, the cultures were negative.  They did do a hip swab from the  small drainage from the posterior hip graft site; showed Staphylococcus,  but abscess cultures were negative for growth during his stay.  He was  continued on the IV antibiotics for five days more from October 15, 2007.  Patient did have several episodes of infiltration of the IV, had  to be restarted five times total.  On October 19, 2007, patient's IV  infiltrated at 10:30 at night.  They called Lianne Cure, PA-C, on  call and it was discussed that the IV can stay out at this point.  We  are going to send him home in the morning on October 20, 2007 on p.o.  antibiotics.  During his stay, his white count was 10.2.  He was  afebrile.  Vital signs were stable.  Blood sugar levels fluctuated.  Medications were changed.  We were advised to start him on metformin as  well as the Glucotrol.  His thigh swelling decreased significantly.  The  compartments were soft distally.  There was no edema.  His calves were  nontender to palpation.  He is ambulating with a straight cane with  physical therapy guidance while he is here.   FINAL DIAGNOSES:  1. Right thigh abscesses, organisms not revealed and no growth from      culture and sensitivity.  He is afebrile.  White count is normal.      His CRP is 45 and his ESR is 90 as of October 15 2007.  2. Lumbar fusion L4-L5 posterior with iliac crest bone graft secondary      to lumbar spondylosis.   PLAN:  We are going to discharge him home with a straight cane, want him  to walk for exercise.  We are going to place him on p.o. antibiotics to  include levofloxacin; he is going to take that once a day, 750 mg for 14  days; clindamycin 450 mg once every six hours for 14 days.  We are also  sending  him home on metformin 750 p.o. two times a day with meals and  Glucotrol 10 mg two times a day with meals, and we want him to follow up  with infectious disease in one week, we want him to follow up with Dr.  Nelda Severe in two weeks, and follow up with his primary care to  discuss his diabetes and follow up with his psychiatrist to discuss his  bipolar medications.  He is going to continue on all his regular  medicines that he was taking upon discharge to include Seroquel and  Depakote.  We also gave him Norco 10/325 one to two q.4 for pain control  and Robaxin 500 mg one to two q.6 p.r.n. for muscle spasms.      Lianne Cure, P.A.      Nelda Severe, MD  Electronically Signed    MC/MEDQ  D:  10/20/2007  T:  10/20/2007  Job:  [phone] [id number]

## 2011-04-14 NOTE — Consult Note (Signed)
[name], ALSOP NO.:  [id number]   MEDICAL RECORD NO.:  [id number]          PATIENT TYPE:  INP   LOCATION:  5041                         FACILITY:  [facility]   PHYSICIAN:  Herbie Saxon, MDDATE OF BIRTH:  07-08-1956   DATE OF CONSULTATION:  DATE OF DISCHARGE:                                 CONSULTATION   REASON FOR CONSULTATION:  Medical management of diabetes mellitus.   PRESENTING COMPLAINTS:  Excessive thirst, excessive urination, weight  gain x2 months, fever 1 day duration.   HISTORY OF PRESENT ILLNESS:  This is a 55 year old African-American male  who is status post lumbar laminectomy and was noticed to be having  elevated blood sugar ranging between 180-190.  Patient also complains of  longstanding polyuria, polydipsia, weight gain.  He denies any blurring  of vision or numbness in the extremities.  He was initially diagnosed  with diabetes about 9 years ago, followed by a doctor in [place].  He used Glucotrol for a couple of months but stopped on his volition.  He has not been on insulin and has not been checking his glucose at  home.  Patient denies any chest pain, shortness of breath, denies any  diarrhea.  Currently, he is constipated.  He has not moved his bowel in  2 days.  He is status post surgery.  He denies any hematochezia or  melena stool.  He denies any palpitations, dyspnea on exertion,  shortness of breath or cough, no headache, fever.  Patient also  complains of malaise and weakness.   PAST MEDICAL HISTORY:  Bipolar disorder, right leg DVT more than 10  years ago, diabetes 9 years.   PAST SURGICAL HISTORY:  Recent laminectomy, appendectomy.   FAMILY HISTORY:  Noncontributory.   SOCIAL HISTORY:  He is single.  He has 2 children, a boy and a girl.   MEDICATIONS:  1. Seroquel 200 mg daily.  2. Trazodone 1 tablet q.h.s.   ALLERGIES:  PENICILLIN.   REVIEW OF SYSTEMS:  Twelve systems reviewed positive as above.   PHYSICAL EXAMINATION:  GENERAL:  He is a middle-aged man in no acute  distress.  VITAL SIGNS:  Temperature 101, pulse 116, respiratory rate 20, blood  pressure 130/80.  SPO:  Not jaundiced.  No cyanosis or clubbing.  HEENT:  Atraumatic, normocephalic.  Mucous membranes moist.  CHEST:  Clinically clear.  CARDIOVASCULAR:  Heart sounds 1 and 2 tachycardiac.  ABDOMEN:  Distended, not tender.  Bowel sounds are hypoactive.  No  organomegaly palpable.  NEUROLOGIC:  Oriented x3.  EXTREMITIES:  Peripheral pulses present.  No pedal edema.   LABORATORY DATA:  The available labs show a sodium is 133, potassium  3.9, chloride 97, bicarbonate 29, glucose 180, BUN 5, creatinine 1.0.  WBC 12.1, hematocrit 32, platelet count 128.  INR is 1.0.   Lumbar spine x-ray shows status post placement of screws and spacers at  L4-L5.  Chest x-ray September 29, 2007 shows scar in left lower lobe.  No  acute disease.  No nodules.  EKG of September 29, 2007 shows normal sinus  rhythm at 82 per minute.   ASSESSMENT:  Uncontrolled diabetes, currently underlying wound  urosepsis, sinus tachycardia, anemia, hyponatremia.   RECOMMENDATIONS:  Counseled patient on the need to adhere to an 1,800  coronary ADA diet plus 200 sliding scale insulin protocol a.c. and h.s.  Lantus insulin 20 units subcu q.h.s.  Started on Glucotrol 2.5 mg daily.  Recheck his lipid profile.  Blood culture.  His chest x-ray should be  repeated.  Start patient on Avelox 100 mg p.o. daily.  Continue with his  previous home medications.  Check the basic metabolic panel in the  morning.   Thanks for this consult.  Will follow with you.      Herbie Saxon, MD  Electronically Signed     MIO/MEDQ  D:  10/07/2007  T:  10/08/2007  Job:  [id number]

## 2011-04-14 NOTE — H&P (Signed)
[name], [name] NO.:  [id number]   MEDICAL RECORD NO.:  [id number]            PATIENT TYPE:   LOCATION:                                 FACILITY:   PHYSICIAN:  Lianne Cure, P.A.  DATE OF BIRTH:  17-Oct-1956   DATE OF ADMISSION:  DATE OF DISCHARGE:                              HISTORY & PHYSICAL   HISTORY OF PRESENT ILLNESS:  He is a 55 year old gentleman that comes in  with a history of back pain.  He fell back in August when he was walking  across the bridge.  The water had washed out the base of the bridge.  He  dipped down about 2 feet and fell backwards and hurt his back.  He did  not have problems with his back prior to this fall.  He goes from the  lower lumbar to the posterior aspect of both legs, staying mostly above  the knees.   PAST MEDICAL HISTORY:  Includes bipolar.   MEDICATIONS:  He is on Depakote daily and Abilify daily.   ALLERGIES:  HE HAS AN ALLERGY TO PENICILLIN.   PAST SURGICAL HISTORY:  He has a surgical history of right shoulder  surgery, hernia repair and appendectomy and a surgery for blood clots.   SOCIAL HISTORY:  He does not drink.  He smokes about a 1/3 of a pack a  day of cigarettes.  Mother is age 74 and healthy.  He is single.  He is  unable to work.   REVIEW OF SYSTEMS:  On review of systems, he reports no weight loss, no  weight gain.  He has seen a primary care recently.  No fever, no chills.  No night sweats.  No chest pain.  No shortness of breath.  No wheezing.  No chronic cough.  No abdominal pain, nausea, vomiting or diarrhea.  No  opening of the skin.  No easily bruising.  He does have sleep  disturbance.  No bowel or bladder incontinence. No headaches.  No  tremors, no changes in vision or speech.  His Oswestry disability index  is 72%.  He has difficulty with sitting for more than an hour.  He can  walk about 100 yards or less.  He at times uses a cane straight cane for  ambulating.  He gets about 6 hours  or less of sleep at night.   PHYSICAL EXAMINATION:  VITAL SIGNS:  On physical examination today,  temperature is 98.7, pulse 100, respirations 18, blood pressure 148/85.  GENERAL:  He is normocephalic in appearance and a very pleasant  gentleman.  HEENT:  Pupils are equal, round, reactive to light.  CHEST:  Clear to auscultation.  No wheezes noted.  NECK:  Supple.  He has decreased neck extension and about 75% flexion  and rotation right and left.  HEART:  Regular rate and rhythm.  No murmur noted.  ABDOMEN:  Soft, nontender to palpation.  Positive bowel sounds.  EXTREMITIES:  Sensation to light touch of bilateral lower extremities is  intact and equal.  Knee jerks and ankle jerks are 1  out of 4 and  symmetric.  He has normal gait.  He can heel walk and toe walk.  Straight leg raises about 70 degrees bilaterally with increased lumbar  pain.  He has a negative femoral nerve stretch.  Trunk range of motion  70 degrees forward increases lumbar pain with arising and extension  about 20 degrees it increases lumbar pain as well.  Strength is 5 out of  5.  No weaknesses noted on lower extremities.  Sensation of upper  extremities is intact and equal.  Biceps, brachial radialis and triceps  reflexes are 1 out of 4 and symmetric.  He has full range of motion of  upper extremities and no weaknesses noted.   X-rays were reviewed.  Show degenerative disk disease at L4-5.  MRI  reveals central disk herniation L4-5.   PLAN:  Posterior lumbar fusion with laminectomy and iliac crest bone  graft by Dr. Nelda Severe at L4-5.      Lianne Cure, P.A.     MC/MEDQ  D:  09/28/2007  T:  09/29/2007  Job:  [id number]

## 2011-04-14 NOTE — H&P (Signed)
[name] NO.:  [id number]   MEDICAL RECORD NO.:  [id number]          PATIENT TYPE:  IPS   LOCATION:  0407                          FACILITY:  [facility]   PHYSICIAN:  Richard Jungling, MD  DATE OF BIRTH:  04-Sep-1956   DATE OF ADMISSION:  06/01/2009  DATE OF DISCHARGE:                       PSYCHIATRIC ADMISSION ASSESSMENT   HISTORY:  This is a voluntary admission to the services of Dr. Geralyn Flash.  This is a 55 year old divorced African American male.  He  presented to the emergency department stating that he had been hearing  voices since Friday.  He was upset upon assessment as he had been in the  ER awaiting ACT Team evaluation since 9 a.m.  He states that the voices  have been getting worse during the assessment, that he could barely hear  above the voices.  He stated that he was fearful they will become  command in nature as they have in the past and someone might get hurt.   PAST PSYCHIATRIC HISTORY:  [name] was last with [place] back in October  2008.  He was treated at that time for the same sort of situation.  He  was having increasing irritability, feeling paranoid and auditory  hallucinations.  He was stabilized and discharged at that time on  Depakote ER 1,000 at h.s. and Seroquel 200.  He has been a long-term  client of the [facility] and is distressed at Dr. Geraldo Docker  retirement.   SOCIAL HISTORY:  He went to the 12th grade.  He has been married and  divorced once.  He has a 39 year old son.  His daughter is now 74.  He  is disabled because of his schizoaffective disorder.  He lives alone in  a house and receives SSDI.   FAMILY HISTORY:  He denies.   ALCOHOL/DRUG HISTORY:  He denies.  His UDS was positive for opiates and  benzodiazepines.  One report stated that he was being admitted for  detox, but in fact he was being treated for pain and bronchitis.  He  does smoke cigarettes.  He smokes 1/2 to 1 pack per day.   CURRENT  MEDICAL [name]:  [facility].   MEDICAL PROBLEMS:  He reports that he has a diagnosis of hepatitis C,  bronchitis, diabetes.   MEDICATIONS:  He reported that his drugs are at [facility].  1. He takes Celebrex 20 mg b.i.d.  2. Ibuprofen 800 mg t.i.d.  3. Tylenol with codeine elixir 5-10 mL p.o. q.4 h. p.r.n.  4. Erythromycin 250 mg p.o. q.i.d.  5. Depakote ER 500 mg p.o. b.i.d.  6. Trazodone 150 mg at h.s.  7. Percocet 10/325 one p.o. q.4 h. p.r.n.   ALLERGIES:  PENICILLINS.   PHYSICAL EXAMINATION:  GENERAL:  As already stated, his UDS was positive  for opiates and benzodiazepines.  His alcohol was less than 5.  VITAL SIGNS:  Temperature 97.3 to 98.1, pulse ranged from 70-88,  respirations 18-22, blood pressure 120/77 to 132/75.   MENTAL STATUS EXAM:  He is unusually well groomed, dressed and appears  to  be adequately nourished.  His speech was not pressured.  It was  normal rate, rhythm and tone.  His mood is somewhat irritable.  He has  not had his Depakote since he was admitted.  Thought processes are  clear, rationale and goal oriented.  He reports suicidal ideation on and  off.  He states he hears a noise like 70 people in a restaurant that he  cannot hear.  He does not appear to be responding to internal stimuli.  He does not have any plan to hurt himself.  He has stated that he needs  to stay here for 4-5 days.  Concentration and memory are intact.  Judgment and insight are intact.  Intelligence is average.   DIAGNOSES:  AXIS I:  Schizoaffective disorder.  AXIS II:  Deferred.  AXIS III:  He has active bronchitis.  He is still under treatment.  AXIS IV:  They said he has moderate stressors mostly related to primary  support issues.  AXIS V:  35.   PLAN:  The plan is to adjust his medications.  Toward that end, we will  restart some Seroquel that was beneficial to him in the past.  He has  well established care in the community as well as place to return to.  He can  probably be discharged in 3-5 days.      [name], P.A.-C.      Richard Jungling, MD  Electronically Signed    MD/MEDQ  D:  06/01/2009  T:  06/01/2009  Job:  [id number]

## 2011-04-14 NOTE — Discharge Summary (Signed)
[name], [name] NO.:  [id number]   MEDICAL RECORD NO.:  [id number]          PATIENT TYPE:  INP   LOCATION:  5041                         FACILITY:  [facility]   PHYSICIAN:  Nelda Severe, MD      DATE OF BIRTH:  1956/10/01   DATE OF ADMISSION:  10/05/2007  DATE OF DISCHARGE:  10/13/2007                               DISCHARGE SUMMARY   REASON FOR ADMISSION:  Lumbar pain status post accident.  He fell  crossing a bridge.  Had back pain that has become chronic.   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   HOSPITAL COURSE:  [name] was admitted on October 05, 2007 for  surgery by Dr. Nelda Severe.  A transforaminal lumbar interbody fusion  at L4-5, posterolateral fusion L4-5, pedicle screws and interbody cage  with harvest of iliac crest graft on the right.  Postoperatively he was  admitted to [facility] room [phone].  Postoperative day 1 the patient was  stable.  Hemoglobin 11.3, white count was 12.1, glucose was 191,  urinalysis was ordered postoperatively secondary to discoloration of  urine output during surgery.  Drains were placed and intact.  Output  total 130 mL.  Temperature maximum was 101.2.  In the morning it was  100.3.  Dressing was clean and dry.  Lower extremities were  neurovascularly motor intact.  Calves were soft and nontender.  Postoperative day 2 temperature max was 101.6, vital signs were stable,  hemoglobin was 10.9, white count was 12.9 and glucose was 180.  At this  point we decided to call a medical consult for work up of diabetes.  He  had not yet passed flatus.  He has probable small ileus.  Physical  therapy started working with the patient.  The dressing was clean and  dry.  The calves were soft and nontender.  Consult was by Dr. Jonna Munro, work up for diabetes, who started him on insulin, ordered a  hemoglobin A1c and the medical doctor also ordered a chest x-ray and  blood cultures x3  secondary to 2 days of fever.  Started on the insulin  protocol a.c. and h.s. and started on Glucotrol 2.5 mg daily and ordered  metabolic panel to be done in the morning.  Postoperative day 3 the  patient was up with therapy, walking with a rolling walker.  We did  order a rolling walker and a 3 in 1 toilet for home use.  His glucose  was ranging 213 to 239.  Temperature on 10/08/2007 was 101.7 and pulse  was 129.  Chest x-ray reported as bilateral atelectasis versus  pneumonia.  The patient was started on vancomycin IV antibiotics once  daily as well as Rocephin.  Orthopedically no new complaints.  His  hemoglobin was 10.6, white count 12.9.  He was febrile in the morning  100.6.  Still no flatus was passed.  Postoperative day 4 the patient was  feeling better.  He complains of some right hip pain over the greater  trochanter and pain with motion at the hip flexor  and a chest x-ray was  reordered showing atelectasis versus pneumonia still.  Continued with IV  antibiotics per medical doctor.  Continued on the Lantus sliding scale.  Dulcolax suppository was given.  Blood cultures are negative at this  point, still pending final.  A urinalysis was reordered.  This is  negative for UTI.  Chest is clear to auscultation.  Postoperative day 5  on October 10, 2007 the patient is walking with a walker independently  in the room.  His hemoglobin is 8.8, white count is 9.2, glucose 211.  Vital signs were stable.  Temperature max was 101.8.  Currently at 98.7.  Blood cultures negative.  Urine cultures negative.  Chest x-ray  continued to show some atelectasis versus pneumonia.  Calves are soft  and nontender.  Active range of motion of the lower extremities is  intact.  Still complains of slight discomfort right hip.  Once he starts  to walk he states his hip feels better.  On November 11, medical doctor  is researching pulse of 126.  He ordered an EKG 12-lead, another chest x-  ray was ordered.   Cultures are still negative.  He also ordered Doppler  studies secondary to swelling that was noted at 12:40 p.m. in bilateral  lower extremities.  The IV antibiotics were changed to p.o. antibiotics,  Avelox 400 mg once daily.  Ileus improved.  Advance diet.  The 12-lead  EKG was normal.  Chest x-ray appears to be clearing showing improvement  in decreased atelectasis.  Doppler study was negative for lower  extremity DVT.  Today, October 13, 2007, the medical doctor has given  [place] clearance to discharge him home.  He did have some drainage from the  hip incision, serosanguineous.  A dressing was placed over the hip last  night.  I removed the dressing today.  No active drainage is present.  No erythema or warmth.  The incision appears to be closed.  I did place  a clean, dry Mepilex dressing over the hip area.  The central lumbar  incisions are completely dry and clean.  Calves are soft and nontender.  There is no apparent edema today.  No fever.  Temperature is 98.7.  White count 8.7, hemoglobin 9.1, glucose 167.  Chest is clear to  auscultation.  Abdomen:  Positive bowel sounds, soft and nontender.   PLAN:  We are going to discharge [name] home.  He has a rolling  walker.  Advised him walking for exercise.  No bending, stooping,  squatting or lifting.  I gave him a 3 in 1 for home use.  Told him he  may shower when he gets home and then place a clean, dry dressing over  the hip wound until there is no drainage whatsoever.  We are going to  have him followup in our office in 2 weeks.  He has an appointment with  the medical doctor.  In the meantime I wrote out prescriptions that were  advised per the medical doctor here for Avelox 400 mg p.o. daily x4 more  days, Lasix 40 mg p.o. daily x2 more days and Glucotrol 10 mg once daily  a count of 30 with 3 refills until he gets with his medical doctor for  management of his diabetes.  He is going to continue his regular  medicines,  Depakote and Seroquel.  I gave him prescriptions for Norco  10/325 mg count of 120 with a refill as well as Robaxin 500 mg  1 to 2  every 6 hours p.r.n. for muscle spasms count of 60 with refill.  We are  going to have him follow up in 2 weeks in the office.  If he has trouble  or concerns prior to this he can call at any time.  I did tell him that  if his hip gets worse he is to call us.   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   DISPOSITION:  Stable.      Lianne Cure, P.A.      Nelda Severe, MD  Electronically Signed    MC/MEDQ  D:  10/13/2007  T:  10/13/2007  Job:  [id number]

## 2011-04-17 NOTE — Discharge Summary (Signed)
[name], [name] NO.:  [id number]   MEDICAL RECORD NO.:  [id number]          PATIENT TYPE:  IPS   LOCATION:  0400                          FACILITY:  [facility]   PHYSICIAN:  Geoffery Lyons, M.D.      DATE OF BIRTH:  December 16, 1955   DATE OF ADMISSION:  12/26/2005  DATE OF DISCHARGE:  12/30/2005                                 DISCHARGE SUMMARY   CHIEF COMPLAINT AND PRESENT ILLNESS:  This was the first admission to [facility] for this 55 year old divorced male voluntarily  admitted.  Admitted increased mood swings, violent behavior for several  days, decreased sleep, racing thoughts, being edgy, irritable.  He was  brought to the emergency room due to increased mood swings and homicidal  ideation.  He was positive for cocaine upon admission.  He was endorsing  paranoia and agitation.  Endorsed compliance with medication but still not  feeling calm.   PAST PSYCHIATRIC HISTORY:  First time at [facility].  Three times at  [facility] in 2006.  Being seen at the [facility] by Dr. Hortencia Pilar and  Harriett Sine __________.   ALCOHOL/DRUG HISTORY:  Cocaine, claims one time in 1-1/2 years.   MEDICAL HISTORY:  HCV.   MEDICATIONS:  Abilify and Depakote.   PHYSICAL EXAMINATION:  Performed and failed to show any acute findings.   LABORATORY DATA:  Blood chemistry with sodium 135, potassium 4.0, glucose  120.  Hemoglobin 18.0.  Drug screen positive for cocaine.   MENTAL STATUS EXAM:  Alert, cooperative male.  Anxious affect.  Disheveled  but somewhat restless.  Speech clear, normal rate, tempo and production.  Mood anxiety.  Affect anxiety, fidgety, restless.  Thought processes  coherent.  No evidence of delusions.  No active suicidal or homicidal  ideation.  No hallucinations.  Cognition was well-preserved.   ADMISSION DIAGNOSES:  AXIS I:  Rule out schizoaffective disorder.  Cocaine  abuse.  AXIS II:  No diagnosis.  AXIS III:  HCV.  AXIS IV:   Moderate.  AXIS V:  GAF upon admission 32; highest GAF in the last year 65.   HOSPITAL COURSE:  He was admitted.  He was started in individual and group  psychotherapy.  His Abilify and his Depakote were adjusted.  On December 28, 2005, he was endorsing that the voices have been decreasing.  Sleep was his  main problem.  Endorsed that the Abilify was agreeing with him.  Continued  to endorse difficulty with sleep, tired in the morning.  The voices were not  still present.  We addressed the insomnia with trazodone, up to 200 mg per  day.  By December 30, 2005, he had slept better.  He was more restful.  Felt  that the medications were working for him.  Endorsing no active  hallucinations.  Mood was euthymic.  He was willing and motivated to pursue  further outpatient treatment and continue to use his medications.  As he was  in full contact with reality with no suicidal or homicidal ideation, no  active psychosis, we discharged to outpatient  follow-up.   DISCHARGE DIAGNOSES:  AXIS I:  Schizoaffective disorder.  Cocaine abuse.  AXIS II:  No diagnosis.  AXIS III:  HCV.  AXIS IV:  Moderate.  AXIS V:  GAF upon discharge 50.   DISCHARGE MEDICATIONS:  1.  Depakote ER 500 mg, 1 in the morning and 2 at night.  2.  Abilify 10 mg, 2 in the morning.  3.  Trazodone 100 mg, 2 at night.  4.  Ambien 10 mg, 1 at night for sleep.   FOLLOW UP:  Dr. Lang Snow at [facility].      Geoffery Lyons, M.D.  Electronically Signed     IL/MEDQ  D:  01/11/2006  T:  01/11/2006  Job:  [id number]
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
## 2011-04-17 NOTE — Discharge Summary (Signed)
[place]  Patient:    [name], [name]                       MRN: [id number] Adm. Date:  [id number] Disc. Date: [id number] Attending:  Glenna Fellows Tappan                           Discharge Summary  DISCHARGE DIAGNOSIS:  Probable cecal diverticulitis.  OPERATIONS AND PROCEDURES:  Laparoscopic appendectomy on May 29, 2000.  HISTORY OF PRESENT ILLNESS:  Mr. Tubby is a 55 year old black male who three to four days ago developed a gradual onset of midabdominal pain which soon localized to the right lower quadrant and became constant and more severe.  He has had nausea without vomiting.  No GU symptoms.  He has no history of any chronic GI symptoms or abdominal pain.  PAST MEDICAL HISTORY:  He has had right shoulder surgery and right inguinal hernia repair.  He has a history of a "blood clot" in his leg and was on Coumadin briefly.  CURRENT MEDICATIONS:  None.  ALLERGIES:  He is allergic to PENICILLIN.  PHYSICAL EXAMINATION:  The temperature was 100.2 degrees, pulse 100, respirations 18, and blood pressure 123/64.  ABDOMEN:  Pertinent findings were limited to the abdomen which revealed localized localized tenderness in the right lower quadrant with guarding.  LABORATORY DATA:  The urinalysis was clear.  The white count was 10.5 and hemoglobin 13.8.  Three-way abdominal x-ray was negative.  HOSPITAL COURSE:  The patient was felt to be clinically highly suspicious for appendicitis and laparoscopic appendectomy was recommended and accepted.  At the time of laparoscopy, his appendix was grossly normal, but there was a localized area in the wall to the cecum that was slightly bulging and reddened and thickened with inflammatory changes consistent with a cecal diverticulitis.  He underwent appendectomy.  Postoperatively he was treated with IV antibiotics for presumed cecal diverticulitis.  He had some ileus over the first couple of days  with some distention.  He did have moderate right lower quadrant tenderness.  He was treated with IV Cipro and Flagyl.  By June 01, 2000, he was having some flatus.  A follow-up CBC showed normal white count.  Pathology revealed mild periappendicitis.  Abdominal x-rays were obtained, which revealed ileus.  On June 02, 2000, there was some persistent right lower quadrant tenderness noted and CT scan of the abdomen and pelvis was obtained, which was negative.  By June 03, 2000, he was passing flatus and was hungry and his diet was advanced.  On June 04, 2000, he was much improved. The abdomen at this time was essentially nontender.  His antibiotics were discontinued.  DISPOSITION:  He was discharged home.  FOLLOW-UP:  Follow-up is to be in my office in one to two weeks. DD:  06/23/00 TD:  06/25/00 Job: [id number]

## 2011-04-17 NOTE — Op Note (Signed)
[place]  Patient:    [name], [name]                       MRN: [id number] Proc. Date: 05/29/00 Adm. Date:  [id number] Attending:  Glenna Fellows Tappan                           Operative Report  PREOPERATIVE DIAGNOSIS:  Acute appendicitis.  POSTOPERATIVE DIAGNOSIS:  Inflamed cecum.  SURGICAL PROCEDURE:  Laparoscopic appendectomy.  SURGEON:  Lorne Skeens. Hoxworth, M.D.  ANESTHESIA:  General.  BRIEF HISTORY:  Donatello Kleve is a 55 year old, black male who presents with a 3-4 day history of gradually worsening right lower quadrant pain and nausea. Examination has revealed point tenderness with guarding and local peritoneal signs in the right lower quadrant.  Laparoscopic appendectomy for probable acute appendicitis has been recommended and accepted.  INFORMED CONSENT:  The nature of the procedure, its indications, the risks of bleeding, infection, and possible negative appendectomy were discussed and understood preoperatively.  He is now brought to the operating room for this procedure.  DESCRIPTION OF OPERATION:  The patient was brought to the operating room and placed in the supine position on the operating table.  General endotracheal anesthesia was induced.  The abdomen was sterilely prepped and draped.  The ______ were place.  Broad spectrum antibiotics had been given intravenously. The abdomen was sterilely prepped and draped.  Local anesthesia was used to infiltrate the trocar sites.  A 1 cm incision was made at the umbilicus, and dissection was carried down the midline fascia, which was sharply incised for 1 cm, and the peritoneum was entered under direct vision.  An Hasson trocar was placed through a mattress suture of #0 Vicryl.  Under direct vision, a 5 mm trocar was placed in the right upper quadrant, and a 12 mm trocar in the left lower quadrant.  The cecum and appendix were exposed.  The appendix was lying free in the  anterior position.  There was a small amount of cloudy fluid in the right gutter.  The appendix grossly did not appear inflamed, although it might have been slightly thickened.  There were, however, some inflammatory changes involving the wall of the cecum, and the cecum seemed moderately thickened.  There were erythematous changes in the wall, which were limited to the cecum.  The remainder of the colon, the terminal ileum, and the remainder of the small bowel appeared entirely normal.  I proceeded with the appendectomy.  The base of the appendix was not involved in this process.  The appendix was elevated, and the mesoappendix was dissected free from the appendix at its base.  The appendix was divided with a firing of the 3.5 mm EndoGIA stapler, and the mesoappendix was then divided with two firings of the vascular stapler.  There was no bleeding, and the staple lines appeared intact.  The appendix was placed in an EndoCatch bag and removed through the umbilicus.  The right lower quadrant was irrigated and inspected for hemostasis, which was complete.  Trocars were removed under direct vision. All CO2 was evacuated from the peritoneal cavity.  The purse-string suture was secured at the umbilicus.  A fascial suture was placed in the left lower quadrant incision.  The skin incisions were closed with interrupted subcuticular 4-0 Vicryl and Steri-Strips.  Sponge, needle, and instrument counts were correct.  Dry, sterile dressings were applied, and the  patient was taken to the recovery room in good condition. DD:  05/29/00 TD:  05/29/00 Job: [id number]

## 2011-04-17 NOTE — Discharge Summary (Signed)
[name], CHERUBINI NO.:  [id number]   MEDICAL RECORD NO.:  [id number]          PATIENT TYPE:  IPS   LOCATION:  0407                          FACILITY:  [facility]   PHYSICIAN:  Anselm Jungling, MD  DATE OF BIRTH:  1956-08-23   DATE OF ADMISSION:  09/08/2007  DATE OF DISCHARGE:  09/13/2007                               DISCHARGE SUMMARY   IDENTIFYING DATA:   REASON FOR ADMISSION:  This was an inpatient psychiatric admission for  Waylan, a 55 year old single African American male, a patient of Dr.  Hortencia Pilar at [facility].  He was admitted due to  increasing irritability, worsening sleep problems, auditory  hallucinations, and feeling paranoid.  His most recent medication  regimen had included Depakote, and Abilify.  He had been started on  Seroquel two days prior to admission.  Please refer to the admission  note for further details pertaining to the symptoms, circumstances, and  history that led to his hospitalization.  He was given an initial Axis I  diagnosis of schizoaffective disorder NOS.   MEDICAL AND LABORATORY:  The patient was medically and physically  assessed by the psychiatric nurse practitioner.  He claimed having a  pinched nerve in the bottom of my spine.  But otherwise was in good  health.  He was medically and physically assessed by the psychiatric  nurse practitioner.  There were no acute medical issues.   HOSPITAL COURSE:  The patient was admitted to the adult inpatient  psychiatric service.  He presented as a well-nourished, well-developed  male who was alert, fully oriented, pleasant and cooperative.  Although  he complained of the above-referenced symptoms, he did not appear to be  psychotic outwardly, and made no delusional statements.  His thought  processes and speech appeared to be rational.  He denied suicidal  ideation.  He verbalized a strong desire for help.   The patient was continued on a regimen of Depakote and  Seroquel.  He was  not really amenable to continuing Abilify.  He did well on this  medication regimen.  He was intermittently quite irritable with staff  during the first 2 days of his hospital stay, but became quite pleasant  as his stay progressed, and even apologetic about his previous  outbursts.   On the sixth hospital day the patient appeared appropriate for discharge  and he agreed to the following aftercare plan.   AFTERCARE:  The patient was to follow-up at the [name] with an  appointment with their psychiatrist on September 15, 2007.   DISCHARGE MEDICATIONS:  Depakote ER 1000 mg q.h.s., and Seroquel 200 mg  q.h.s.   DISCHARGE DIAGNOSES:  AXIS I: Schizoaffective disorder, not otherwise  specified.  AXIS II: Deferred.  AXIS III: No acute or chronic illnesses.  AXIS IV: Stressors severe.  AXIS V: Global Assessment of Functioning on discharge 55.      Anselm Jungling, MD  Electronically Signed     SPB/MEDQ  D:  09/16/2007  T:  09/18/2007  Job:  [id number]

## 2011-04-17 NOTE — Discharge Summary (Signed)
[name], [name] NO.:  [id number]   MEDICAL RECORD NO.:  [id number]          PATIENT TYPE:  IPS   LOCATION:  0404                          FACILITY:  [facility]   PHYSICIAN:  Jasmine Pang, M.D. DATE OF BIRTH:  06-Jul-1956   DATE OF ADMISSION:  07/22/2006  DATE OF DISCHARGE:  07/26/2006                                 DISCHARGE SUMMARY   IDENTIFYING INFORMATION:  The patient is a 55 year old single African-  American male who was admitted on a voluntary basis on July 22, 2006.   HISTORY OF PRESENT ILLNESS:  The patient has had a history of psychosis,  positive auditory hallucinations.  He hears mumbling and has also heard  voices that there was something under his skin.  He tried to cut something  out of his left knee.  He has had no sleep in the past five days.  He stated  I will not provide a piss sample.  He states medicines are not controlling  his anger.  He has been threatening and holding a knife to his friend.  He  states he has been very difficult for his family to live with.   PAST PSYCHIATRIC HISTORY:  This is the first [facility] admission for this patient.  The patient sees Dr. Hortencia Pilar at [facility].  For  further admission information, see psychiatric admission assessment.   PHYSICAL EXAMINATION:  The patient was a well-nourished, well-developed,  African-American male who had no acute medical problems.  He had a healed  scar on his left knee.   LABORATORY DATA:  He refused lab drawing.   HOSPITAL COURSE:  Upon admission, the patient was begun on Ambien 10 mg p.o.  q.h.s. p.r.n., trazodone 100 mg p.o. q.h.s., Depakote ER 500 mg p.o. b.i.d.,  Risperdal M-Tab 0.5 mg p.o. q.6h. p.r.n., Ativan 2 p.o. or IM q.6h. p.r.n.  anxiety or agitation.  On July 23, 2006, Depakote ER was increased to 500  mg q.a.m. and 1000 mg q.h.s., Risperdal 1 mg at 2100.  P.r.n. Risperdal was  continued.  He was also given Geodon 20 mg IM x1 to be  used only for  emergency sedation.  The patient tolerated these medications well with no  significant side effects.  There is a Depakote level pending but had not  returned at the time of this discharge.   The patient stated that he was here after he got confused on his Depakote  dose and forgot to get a level.  His mood worsened and got agitated.  He  threatened to kill others, was aggressive.  He had auditory hallucinations  with commands to kill himself.  He had lost a lot of sleep lately.  On  July 24, 2006 and July 25, 2006, the patient had improved markedly.  He  was pleasant, relaxed, well-organized.  He denied suicidal or homicidal  ideation.  He felt the Depakote had helped.  On July 26, 2006, the  patient's mental status had improved.  Mood was less depressed and anxious  and irritable.  Affect wider range.  No suicidal  or homicidal ideation.  No  auditory or visual hallucinations.  No delusions or paranoia.  Thoughts were  logical and goal directed.  Thought content with no predominant theme.  Cognitive was grossly within normal limits.   DISCHARGE DIAGNOSES:  AXIS I:  Schizoaffective disorder, bipolar-type.  AXIS II:  None.  AXIS III:  Hepatitis C.  AXIS IV:  Severe (psychosocial problems, primary support group, medical  problems).  AXIS V:  GAF upon discharge 45; GAF upon admission 30; GAF highest past year  60.   ACTIVITY/DIET:  There were no specific activity level or dietary  restrictions.   DISCHARGE MEDICATIONS:  1. Depakote ER 500 mg q.a.m. and 1000 mg q.h.s.  2. Risperdal 1 mg at 9 p.m.  3. Trazodone 100 mg p.o. q.h.s. at bedtime as needed.   POST-HOSPITAL CARE PLANS:  The patient will return to the [facility] to see Dr. Lang Snow on July 29, 2006 at 10 a.m.      Jasmine Pang, M.D.  Electronically Signed     BHS/MEDQ  D:  07/26/2006  T:  07/26/2006  Job:  [id number]

## 2011-05-31 HISTORY — PX: BACK SURGERY: SHX140

## 2011-06-01 ENCOUNTER — Encounter (HOSPITAL_COMMUNITY)
Admission: RE | Admit: 2011-06-01 | Discharge: 2011-06-01 | Disposition: A | Discharge status: Home / Self Care | Payer: Medicare Other | Source: Ambulatory Visit | Attending: Orthopedic Surgery | Admitting: Orthopedic Surgery

## 2011-06-01 ENCOUNTER — Other Ambulatory Visit (HOSPITAL_COMMUNITY): Payer: Self-pay | Admitting: Orthopedic Surgery

## 2011-06-01 ENCOUNTER — Ambulatory Visit (HOSPITAL_COMMUNITY)
Admission: RE | Admit: 2011-06-01 | Discharge: 2011-06-01 | Disposition: A | Discharge status: Home / Self Care | Payer: Medicare Other | Source: Ambulatory Visit | Attending: Orthopedic Surgery | Admitting: Orthopedic Surgery

## 2011-06-01 DIAGNOSIS — Z01810 Encounter for preprocedural cardiovascular examination: Secondary | ICD-10-CM | POA: Insufficient documentation

## 2011-06-01 DIAGNOSIS — Z01818 Encounter for other preprocedural examination: Secondary | ICD-10-CM | POA: Insufficient documentation

## 2011-06-01 DIAGNOSIS — Z01812 Encounter for preprocedural laboratory examination: Secondary | ICD-10-CM | POA: Insufficient documentation

## 2011-06-01 DIAGNOSIS — I517 Cardiomegaly: Secondary | ICD-10-CM | POA: Insufficient documentation

## 2011-06-01 LAB — URINALYSIS, ROUTINE W REFLEX MICROSCOPIC
Bilirubin Urine: NEGATIVE
Glucose, UA: NEGATIVE mg/dL
Ketones, ur: NEGATIVE mg/dL
Leukocytes, UA: NEGATIVE
Nitrite: NEGATIVE
Protein, ur: NEGATIVE mg/dL

## 2011-06-01 LAB — COMPREHENSIVE METABOLIC PANEL
AST: 46 U/L — ABNORMAL HIGH (ref 0–37)
BUN: 13 mg/dL (ref 6–23)
CO2: 29 mEq/L (ref 19–32)
Calcium: 9.3 mg/dL (ref 8.4–10.5)
Creatinine, Ser: 1.07 mg/dL (ref 0.50–1.35)
GFR calc non Af Amer: >60 mL/min (ref >60)
Total Bilirubin: 0.4 mg/dL (ref 0.3–1.2)

## 2011-06-01 LAB — SURGICAL PCR SCREEN
MRSA, PCR: NEGATIVE
Staphylococcus aureus: NEGATIVE

## 2011-06-01 LAB — DIFFERENTIAL
Basophils Relative: 1 % (ref 0–1)
Eosinophils Absolute: 0.4 10*3/uL (ref 0.0–0.7)
Eosinophils Relative: 5 % (ref 0–5)
Neutrophils Relative %: 35 % — ABNORMAL LOW (ref 43–77)

## 2011-06-01 LAB — CBC
Platelets: 179 10*3/uL (ref 150–400)
RBC: 4.29 MIL/uL (ref 4.22–5.81)
RDW: 11.4 % — ABNORMAL LOW (ref 11.5–15.5)
WBC: 7.4 10*3/uL (ref 4.0–10.5)

## 2011-06-01 LAB — APTT: aPTT: 33 seconds (ref 24–37)

## 2011-06-01 LAB — PROTIME-INR: Prothrombin Time: 13.5 seconds (ref 11.6–15.2)

## 2011-06-09 ENCOUNTER — Inpatient Hospital Stay (HOSPITAL_COMMUNITY)
Admission: RE | Admit: 2011-06-09 | Discharge: 2011-06-13 | DRG: 497 | Disposition: A | Discharge status: Home / Self Care | Payer: Medicare Other | Source: Ambulatory Visit | Attending: Orthopedic Surgery | Admitting: Orthopedic Surgery

## 2011-06-09 ENCOUNTER — Inpatient Hospital Stay (HOSPITAL_COMMUNITY): Payer: Medicare Other

## 2011-06-09 DIAGNOSIS — F209 Schizophrenia, unspecified: Secondary | ICD-10-CM | POA: Diagnosis present

## 2011-06-09 DIAGNOSIS — Z981 Arthrodesis status: Secondary | ICD-10-CM

## 2011-06-09 DIAGNOSIS — T8489XA Other specified complication of internal orthopedic prosthetic devices, implants and grafts, initial encounter: Principal | ICD-10-CM | POA: Diagnosis present

## 2011-06-09 DIAGNOSIS — Z880 Allergy status to penicillin: Secondary | ICD-10-CM

## 2011-06-09 DIAGNOSIS — J4489 Other specified chronic obstructive pulmonary disease: Secondary | ICD-10-CM | POA: Diagnosis present

## 2011-06-09 DIAGNOSIS — F17200 Nicotine dependence, unspecified, uncomplicated: Secondary | ICD-10-CM | POA: Diagnosis present

## 2011-06-09 DIAGNOSIS — F319 Bipolar disorder, unspecified: Secondary | ICD-10-CM | POA: Diagnosis present

## 2011-06-09 DIAGNOSIS — Z01810 Encounter for preprocedural cardiovascular examination: Secondary | ICD-10-CM

## 2011-06-09 DIAGNOSIS — K219 Gastro-esophageal reflux disease without esophagitis: Secondary | ICD-10-CM | POA: Diagnosis present

## 2011-06-09 DIAGNOSIS — Z01812 Encounter for preprocedural laboratory examination: Secondary | ICD-10-CM

## 2011-06-09 DIAGNOSIS — J449 Chronic obstructive pulmonary disease, unspecified: Secondary | ICD-10-CM | POA: Diagnosis present

## 2011-06-09 DIAGNOSIS — Y92009 Unspecified place in unspecified non-institutional (private) residence as the place of occurrence of the external cause: Secondary | ICD-10-CM

## 2011-06-09 DIAGNOSIS — Y831 Surgical operation with implant of artificial internal device as the cause of abnormal reaction of the patient, or of later complication, without mention of misadventure at the time of the procedure: Secondary | ICD-10-CM | POA: Diagnosis present

## 2011-06-09 LAB — TYPE AND SCREEN: ABO/RH(D): B POS

## 2011-07-02 NOTE — Op Note (Signed)
NAMEZAVIAN, SLOWEY NO.:  [id number]  MEDICAL RECORD NO.:  [id number]  LOCATION:  5030                         FACILITY:  [facility]  PHYSICIAN:  Nelda Severe, MD      DATE OF BIRTH:  1956-02-26  DATE OF PROCEDURE:  06/09/2011 DATE OF DISCHARGE:                              OPERATIVE REPORT   SURGEON:  Nelda Severe, MD  ASSISTANT:  OR staff.  PREOPERATIVE DIAGNOSIS:  Status post L3-L5 fusion with retained pedicle screws and rods.  POSTOPERATIVE DIAGNOSIS:  Status post L3-L5 fusion with retained pedicle screws and rods.  OPERATIVE PROCEDURE:  Removal of pedicle screws and rods from the lumbar spine.  INDICATIONS FOR SURGERY:  Pain relieved by peri-implant local anesthetic injection.  OPERATIVE PROCEDURE:  The patient was placed under general endotracheal anesthesia.  Vancomycin was infused intravenously.  A Foley catheter was placed in the bladder.  Sequential compression devices were placed on both lower extremities.  He was positioned prone on a Jackson frame. Care was taken to position the upper extremities so as to avoid hyperflexion and abduction of the shoulders and so as to avoid hyperflexion of the elbows.  The upper extremities were padded with foam from hands to axilla.  Thighs, knees, shins, and ankles were supported on pillows.  The thoracolumbar area was prepped with DuraPrep and draped in rectangular fashion.  The drapes were secured with Ioban.  A time-out was then held during which the usual parameters were discussed/confirmed.  A cross-table lateral radiograph was taken with an 18-gauge spinal needle placed in the proximal and midportion of the incision and when the image returned, it appeared that this was at the level of the middle screw, that is the L4 screw.  The incision was centered on that level. A portion of the previous scar was excised in elliptical fashion after injecting subcutaneously with 1% lidocaine with  epinephrine.  Dissection was carried down in the midline and taking great care to not dissect deeply enough to penetrate the dura in the portion of the spine where laminectomy had been produced.  We then mobilized paraspinal muscles and scar bilaterally to reveal the screws on both sides which were exposed from L3 proximally to L5 distally.  The screws were [place] spine screws and we had the appropriate instrumentation for their removal.  The facet screws were removed and the rods removed.  We then backed out each screw separately and they appeared to be tight.  Each hole was injected with FloSeal.  We then irrigated the wound thoroughly with antibiotic solution.  A slab of Gelfoam was placed on either side over the area where the screws had been implanted to try to prevent further bleeding into the wound.  A 15- gauge Blake drain was placed subfascially and brought through the skin to the right side where it was secured with a 2-0 nylon suture.  The paraspinal muscle scar was then reapposed using #1 Vicryl in interrupted fashion, simple and vertical mattress sutures.  The subcutaneous layer was closed using inverted Vicryl sutures.  The skin was closed using simple 2-0 nylon sutures in interrupted fashion.  Nonadherent dressing with antibiotic ointment was applied and  secured with OpSite.  The patient was then transferred to his bed, awakened, and brought to the recovery room where he is now at the time of dictation.  There were no intraoperative complications.  The sponge and needle counts were correct.  The explanted implants were counted and there were 6 facet screws, 6 pedicle screws, and 2 rods which accounted for all of the material which had been implanted.  In the recovery room, the patient is awake and in satisfactory condition.  Blood loss was estimated at approximately 200 mL.     Nelda Severe, MD     MT/MEDQ  D:  06/09/2011  T:  06/09/2011  Job:   [id number]  Electronically Signed by Nelda Severe MD on 07/02/2011 02:52:22 PM

## 2011-07-02 NOTE — Discharge Summary (Signed)
  [name], [name] NO.:  [id number]  MEDICAL RECORD NO.:  [id number]  LOCATION:  5030                         FACILITY:  [facility]  PHYSICIAN:  Nelda Severe, MD      DATE OF BIRTH:  1956/10/30  DATE OF ADMISSION:  06/09/2011 DATE OF DISCHARGE:  06/13/2011                              DISCHARGE SUMMARY   FINAL DIAGNOSIS:  Status post L3-L5 fusion with retained pedicle screws and rods.  This man was admitted for removal of segmental fixation of lumbar spine, deemed to be contributing to his ongoing back pain.  The day of admission he was taken to the operating room where the segmental fixation, 6 screws and 2 rods were removed without event. Postoperatively, he had no complications, although he had to be retained in the hospital for the fourth postop day for adequate control of pain. He ambulated without difficulty.  At the present time, his wound is dry without evidence of inflammation etc.  His pain control is adequate, on oral analgesics.  He will be followed in the office in approximately 6 days' time for inspection of his wound and possible removal of sutures.  He is being given a prescription for Norco 10 one to two q.4 h. p.r.n. maximum 8 per day, 100 tablets and Flexeril 75 mg, 1 tablet every 4 hours p.r.n. for muscle spasm.  CONDITION ON DISCHARGE:  Wound stable, ambulatory.  Followup - as above.  In addition to the prescriptions for Flexeril and Norco he will continue on his Seroquel and trazodone with which he is chronically medicated and will acquire over-the-counter 25 mg Benadryl capsules in order to help with itching associated with his medications.     Nelda Severe, MD     MT/MEDQ  D:  06/13/2011  T:  06/13/2011  Job:  [id number]  Electronically Signed by Nelda Severe MD on 07/02/2011 02:52:19 PM

## 2011-08-24 LAB — POCT I-STAT, CHEM 8
Creatinine, Ser: 1
HCT: 42
Hemoglobin: 14.3
Potassium: 3.7
Sodium: 139
TCO2: 27

## 2011-08-24 LAB — CBC
HCT: 40.2
MCHC: 35
MCV: 98.6
Platelets: 200
RDW: 12.7
WBC: 7.3

## 2011-08-24 LAB — DIFFERENTIAL
Basophils Absolute: 0.1
Basophils Relative: 1
Eosinophils Absolute: 0.2
Eosinophils Relative: 3
Lymphs Abs: 3.2
Neutrophils Relative %: 43

## 2011-09-02 ENCOUNTER — Other Ambulatory Visit: Payer: Self-pay | Admitting: Orthopedic Surgery

## 2011-09-02 DIAGNOSIS — M545 Low back pain, unspecified: Secondary | ICD-10-CM

## 2011-09-02 DIAGNOSIS — M79604 Pain in right leg: Secondary | ICD-10-CM

## 2011-09-04 LAB — URINALYSIS, ROUTINE W REFLEX MICROSCOPIC
Bilirubin Urine: NEGATIVE
Nitrite: NEGATIVE
Specific Gravity, Urine: 1.021
pH: 5.5

## 2011-09-04 LAB — CULTURE, BLOOD (ROUTINE X 2): Culture: NO GROWTH

## 2011-09-04 LAB — BODY FLUID CELL COUNT WITH DIFFERENTIAL
Lymphs, Fluid: 23
Neutrophil Count, Fluid: 73 — ABNORMAL HIGH

## 2011-09-04 LAB — COMPREHENSIVE METABOLIC PANEL
Albumin: 3.8
CO2: 25
Calcium: 9.7
GFR calc Af Amer: >60
Glucose, Bld: 113 — ABNORMAL HIGH
Potassium: 3.7
Sodium: 140
Total Bilirubin: 0.9
Total Protein: 8.3

## 2011-09-04 LAB — CBC
Hemoglobin: 12.6 — ABNORMAL LOW
Hemoglobin: 14.1
MCHC: 34.6
RBC: 4.03 — ABNORMAL LOW
RBC: 4.51

## 2011-09-04 LAB — CULTURE, ROUTINE-ABSCESS

## 2011-09-04 LAB — DIFFERENTIAL
Basophils Absolute: 0.1
Lymphocytes Relative: 48 — ABNORMAL HIGH
Monocytes Absolute: 0.5
Neutro Abs: 2.5
Neutrophils Relative %: 39 — ABNORMAL LOW

## 2011-09-04 LAB — BASIC METABOLIC PANEL
Calcium: 9.4
GFR calc Af Amer: >60
GFR calc non Af Amer: >60
Sodium: 141

## 2011-09-04 LAB — ANAEROBIC CULTURE

## 2011-09-04 LAB — C-REACTIVE PROTEIN: CRP: 0.3 — ABNORMAL LOW (ref <0.6)

## 2011-09-04 LAB — SEDIMENTATION RATE: Sed Rate: 25 — ABNORMAL HIGH

## 2011-09-04 LAB — VANCOMYCIN, TROUGH: Vancomycin Tr: 13

## 2011-09-04 LAB — PSA: PSA: 0.45

## 2011-09-08 LAB — BASIC METABOLIC PANEL
BUN: 6
BUN: 7
BUN: 9
BUN: 9
CO2: 28
CO2: 28
CO2: 28
CO2: 29
CO2: 30
CO2: 31
Calcium: 8.9
Calcium: 8.4
Calcium: 8.5
Calcium: 8.7
Calcium: 8.8
Chloride: 100
Chloride: 95 — ABNORMAL LOW
Chloride: 95 — ABNORMAL LOW
Chloride: 97
Chloride: 98
Creatinine, Ser: 0.83
Creatinine, Ser: 0.82
Creatinine, Ser: 0.9
Creatinine, Ser: 1
GFR calc Af Amer: >60
GFR calc Af Amer: >60
GFR calc Af Amer: >60
GFR calc Af Amer: >60
GFR calc non Af Amer: >60
GFR calc non Af Amer: >60
GFR calc non Af Amer: >60
GFR calc non Af Amer: >60
Glucose, Bld: 173 — ABNORMAL HIGH
Glucose, Bld: 180 — ABNORMAL HIGH
Glucose, Bld: 180 — ABNORMAL HIGH
Glucose, Bld: 191 — ABNORMAL HIGH
Potassium: 3.9
Potassium: 3.1 — ABNORMAL LOW
Potassium: 3.5
Potassium: 4
Sodium: 133 — ABNORMAL LOW
Sodium: 134 — ABNORMAL LOW
Sodium: 135
Sodium: 136
Sodium: 138

## 2011-09-08 LAB — CULTURE, ROUTINE-ABSCESS: Culture: NO GROWTH

## 2011-09-08 LAB — COMPREHENSIVE METABOLIC PANEL
ALT: 37
AST: 40 — ABNORMAL HIGH
Albumin: 2.3 — ABNORMAL LOW
Albumin: 2.3 — ABNORMAL LOW
Alkaline Phosphatase: 45
Alkaline Phosphatase: 46
BUN: 7
BUN: 7
CO2: 27
Calcium: 8.7
Chloride: 103
Chloride: 104
Creatinine, Ser: 0.87
GFR calc Af Amer: >60
GFR calc non Af Amer: >60
GFR calc non Af Amer: >60
Glucose, Bld: 160 — ABNORMAL HIGH
Glucose, Bld: 183 — ABNORMAL HIGH
Potassium: 4.3
Potassium: 4.3
Sodium: 139
Total Bilirubin: 0.5
Total Bilirubin: 0.6
Total Protein: 6.3
Total Protein: 6.4

## 2011-09-08 LAB — CBC
HCT: 25.7 — ABNORMAL LOW
HCT: 25.8 — ABNORMAL LOW
HCT: 26.2 — ABNORMAL LOW
HCT: 26.9 — ABNORMAL LOW
HCT: 29.3 — ABNORMAL LOW
HCT: 30.3 — ABNORMAL LOW
Hemoglobin: 9.8 — ABNORMAL LOW
Hemoglobin: 10.6 — ABNORMAL LOW
Hemoglobin: 11.3 — ABNORMAL LOW
Hemoglobin: 9.8 — ABNORMAL LOW
MCHC: 33
MCHC: 33.4
MCHC: 34.6
MCHC: 34.6
MCHC: 34.9
MCHC: 35
MCV: 93.6
MCV: 93.1
MCV: 93.6
MCV: 93.7
MCV: 94.6
MCV: 96
MCV: 96.2
MCV: 96.6
Platelets: 503 — ABNORMAL HIGH
Platelets: 120 — ABNORMAL LOW
Platelets: 129 — ABNORMAL LOW
Platelets: 165
Platelets: 188
Platelets: 305
Platelets: 423 — ABNORMAL HIGH
RBC: 2.68 — ABNORMAL LOW
RBC: 2.73 — ABNORMAL LOW
RBC: 2.76 — ABNORMAL LOW
RBC: 3.1 — ABNORMAL LOW
RBC: 3.39 — ABNORMAL LOW
RDW: 13.8
RDW: 14.6
RDW: 11.4 — ABNORMAL LOW
RDW: 11.6
RDW: 11.7
RDW: 13.6
RDW: 14.1
WBC: 8.2
WBC: 10.4
WBC: 10.8 — ABNORMAL HIGH
WBC: 8.7
WBC: 9.1
WBC: 9.2

## 2011-09-08 LAB — GRAM STAIN

## 2011-09-08 LAB — PROTIME-INR
INR: 1
Prothrombin Time: 13.7

## 2011-09-08 LAB — CULTURE, BLOOD (ROUTINE X 2)
Culture: NO GROWTH
Culture: NO GROWTH
Culture: NO GROWTH
Culture: NO GROWTH

## 2011-09-08 LAB — URINE CULTURE
Colony Count: NO GROWTH
Culture: NO GROWTH

## 2011-09-08 LAB — DIFFERENTIAL
Basophils Absolute: 0.2 — ABNORMAL HIGH
Basophils Absolute: 0.1
Basophils Relative: 2 — ABNORMAL HIGH
Basophils Relative: 1
Eosinophils Absolute: 0.3
Eosinophils Absolute: 0.3
Eosinophils Relative: 3
Eosinophils Relative: 3
Lymphocytes Relative: 30
Lymphs Abs: 3.2
Monocytes Absolute: 1.2 — ABNORMAL HIGH
Monocytes Absolute: 1.4 — ABNORMAL HIGH
Monocytes Relative: 13 — ABNORMAL HIGH
Neutro Abs: 5.3
Neutro Abs: 5.8
Neutrophils Relative %: 54

## 2011-09-08 LAB — ANAEROBIC CULTURE

## 2011-09-08 LAB — BASIC METABOLIC PANEL WITH GFR
BUN: 4 — ABNORMAL LOW
Chloride: 102
Creatinine, Ser: 0.79
Glucose, Bld: 202 — ABNORMAL HIGH
Potassium: 4.2

## 2011-09-08 LAB — URINALYSIS, ROUTINE W REFLEX MICROSCOPIC
Bilirubin Urine: NEGATIVE
Bilirubin Urine: NEGATIVE
Glucose, UA: NEGATIVE
Hgb urine dipstick: NEGATIVE
Ketones, ur: 15 — AB
Protein, ur: NEGATIVE
Protein, ur: NEGATIVE
Urobilinogen, UA: 0.2
Urobilinogen, UA: 1

## 2011-09-08 LAB — WOUND CULTURE

## 2011-09-08 LAB — APTT: aPTT: 32

## 2011-09-08 LAB — B-NATRIURETIC PEPTIDE (CONVERTED LAB): Pro B Natriuretic peptide (BNP): <30

## 2011-09-08 LAB — CROSSMATCH: ABO/RH(D): B POS

## 2011-09-08 LAB — SEDIMENTATION RATE: Sed Rate: 90 — ABNORMAL HIGH

## 2011-09-08 LAB — C-REACTIVE PROTEIN: CRP: 4.5 — ABNORMAL HIGH (ref <0.6)

## 2011-09-09 LAB — COMPREHENSIVE METABOLIC PANEL
ALT: 71 — ABNORMAL HIGH
AST: 47 — ABNORMAL HIGH
Alkaline Phosphatase: 72
CO2: 26
Calcium: 9.6
Chloride: 103
GFR calc Af Amer: >60
GFR calc non Af Amer: >60
Glucose, Bld: 137 — ABNORMAL HIGH
Potassium: 3.9
Sodium: 136

## 2011-09-09 LAB — CBC
Hemoglobin: 14.3
MCHC: 34.6
RBC: 4.35
WBC: 9.7

## 2011-09-09 LAB — DIFFERENTIAL
Basophils Relative: 1
Eosinophils Absolute: 0.5
Eosinophils Relative: 5
Lymphs Abs: 4 — ABNORMAL HIGH
Neutrophils Relative %: 44

## 2011-09-09 LAB — PROTIME-INR: Prothrombin Time: 13

## 2011-09-09 LAB — TYPE AND SCREEN: Antibody Screen: NEGATIVE

## 2011-09-10 ENCOUNTER — Ambulatory Visit
Admission: RE | Admit: 2011-09-10 | Discharge: 2011-09-10 | Disposition: A | Discharge status: Home / Self Care | Payer: Medicare Other | Source: Ambulatory Visit | Attending: Orthopedic Surgery | Admitting: Orthopedic Surgery

## 2011-09-10 DIAGNOSIS — M545 Low back pain: Secondary | ICD-10-CM

## 2011-09-10 DIAGNOSIS — M79604 Pain in right leg: Secondary | ICD-10-CM

## 2011-09-10 LAB — COMPREHENSIVE METABOLIC PANEL
ALT: 137 — ABNORMAL HIGH
AST: 94 — ABNORMAL HIGH
CO2: 25
Chloride: 101
GFR calc Af Amer: >60
GFR calc non Af Amer: 52 — ABNORMAL LOW
Glucose, Bld: 153 — ABNORMAL HIGH
Sodium: 135
Total Bilirubin: 0.8

## 2011-09-10 LAB — CBC
Hemoglobin: 13.7
MCV: 96.1
RBC: 4.26
WBC: 6.8

## 2011-09-10 LAB — DRUGS OF ABUSE SCREEN W/O ALC, ROUTINE URINE
Barbiturate Quant, Ur: NEGATIVE
Benzodiazepines.: NEGATIVE
Cocaine Metabolites: NEGATIVE
Methadone: NEGATIVE
Phencyclidine (PCP): NEGATIVE

## 2011-09-10 LAB — URINALYSIS, ROUTINE W REFLEX MICROSCOPIC
Bilirubin Urine: NEGATIVE
Glucose, UA: NEGATIVE
Hgb urine dipstick: NEGATIVE
Ketones, ur: NEGATIVE
Protein, ur: NEGATIVE

## 2011-09-10 MED ORDER — DIAZEPAM 5 MG PO TABS
10.0000 mg | ORAL_TABLET | Freq: Once | ORAL | Status: AC
  Administered 2011-09-10: 10 mg via ORAL

## 2011-09-10 MED ORDER — OXYCODONE-ACETAMINOPHEN 5-325 MG PO TABS
2.0000 | ORAL_TABLET | Freq: Once | ORAL | Status: AC
  Administered 2011-09-10: 2 via ORAL

## 2011-09-10 MED ORDER — IOHEXOL 180 MG/ML  SOLN
15.0000 mL | Freq: Once | INTRAMUSCULAR | Status: AC | PRN
  Administered 2011-09-10: 15 mL via INTRATHECAL

## 2011-10-16 ENCOUNTER — Encounter (HOSPITAL_COMMUNITY): Payer: Self-pay

## 2011-10-16 ENCOUNTER — Encounter (HOSPITAL_COMMUNITY)
Admission: RE | Admit: 2011-10-16 | Discharge: 2011-10-16 | Disposition: A | Discharge status: Home / Self Care | Payer: Medicare Other | Source: Ambulatory Visit | Attending: Orthopedic Surgery | Admitting: Orthopedic Surgery

## 2011-10-16 HISTORY — DX: Other chronic pain: G89.29

## 2011-10-16 HISTORY — DX: Personal history of colonic polyps: Z86.010

## 2011-10-16 HISTORY — DX: Dorsalgia, unspecified: M54.9

## 2011-10-16 HISTORY — DX: Pneumonia, unspecified organism: J18.9

## 2011-10-16 HISTORY — DX: Personal history of colon polyps, unspecified: Z86.0100

## 2011-10-16 LAB — TYPE AND SCREEN
ABO/RH(D): B POS
Antibody Screen: NEGATIVE

## 2011-10-16 LAB — COMPREHENSIVE METABOLIC PANEL
ALT: 69 U/L — ABNORMAL HIGH (ref 0–53)
AST: 40 U/L — ABNORMAL HIGH (ref 0–37)
CO2: 28 mEq/L (ref 19–32)
Calcium: 9.2 mg/dL (ref 8.4–10.5)
Creatinine, Ser: 0.81 mg/dL (ref 0.50–1.35)
GFR calc Af Amer: >90 mL/min (ref >90)
GFR calc non Af Amer: >90 mL/min (ref >90)
Sodium: 136 mEq/L (ref 135–145)
Total Protein: 7.7 g/dL (ref 6.0–8.3)

## 2011-10-16 LAB — DIFFERENTIAL
Basophils Absolute: 0.1 10*3/uL (ref 0.0–0.1)
Eosinophils Relative: 4 % (ref 0–5)
Lymphocytes Relative: 46 % (ref 12–46)
Lymphs Abs: 3.5 10*3/uL (ref 0.7–4.0)
Neutro Abs: 3.2 10*3/uL (ref 1.7–7.7)

## 2011-10-16 LAB — URINALYSIS, ROUTINE W REFLEX MICROSCOPIC
Leukocytes, UA: NEGATIVE
Nitrite: NEGATIVE
Protein, ur: NEGATIVE mg/dL
Specific Gravity, Urine: 1.011 (ref 1.005–1.030)
Urobilinogen, UA: 1 mg/dL (ref 0.0–1.0)

## 2011-10-16 LAB — CBC
MCH: 33.6 pg (ref 26.0–34.0)
MCHC: 36.1 g/dL — ABNORMAL HIGH (ref 30.0–36.0)
MCV: 93 fL (ref 78.0–100.0)
Platelets: 161 10*3/uL (ref 150–400)

## 2011-10-16 LAB — PROTIME-INR: Prothrombin Time: 13.9 seconds (ref 11.6–15.2)

## 2011-10-16 NOTE — Pre-Procedure Instructions (Signed)
20 [name]  10/16/2011   Your procedure is scheduled ZO:XWRU,EAV 20 @ 0730 Report to [facility] at 0530 AM.  Call this number if you have problems the morning of surgery: [phone]   Remember:   Do not eat food:After Midnight.  Do not drink clear liquids: 4 Hours before arrival.May have soda,tea,black coffee,apple/grape juice,broth,or water until 1:30am  Take these medicines the morning of surgery with A SIP OF WATER:    Do not wear jewelry, make-up or nail polish.  Do not wear lotions, powders, or perfumes. You may wear deodorant.  Do not shave 48 hours prior to surgery.  Do not bring valuables to the hospital.  Contacts, dentures or bridgework may not be worn into surgery.  Leave suitcase in the car. After surgery it may be brought to your room.  For patients admitted to the hospital, checkout time is 11:00 AM the day of discharge.   Patients discharged the day of surgery will not be allowed to drive home.  Name and phone number of your driver:   Special Instructions: CHG Shower Use Special Wash: 1/2 bottle night before surgery and 1/2 bottle morning of surgery.   Please read over the following fact sheets that you were given: Pain Booklet, Coughing and Deep Breathing, Blood Transfusion Information, MRSA Information and Surgical Site Infection Prevention

## 2011-10-16 NOTE — H&P (Signed)
[name] PATIENT: [name] DATE OF BIRTH: Apr 14, 1956 DATE: 10/16/2011 10:00 AM  VISIT TYPE: Pre Op Visit DATE OF INJURY:    Patient was seen in the PA clinic today.   History of Present Illness: This 55 year old  male presents with: 1.  lumbar spine pain  Patient comes into the office for a H&P for L2-3 &L3-4 PSF on 10-20-11. Past Surgical History: Procedure/Surgery Side Year  shoulder    Appendectomy    Cholecystectomy    Hernia repair  2009  Lumbar fusion with ins  2008  removal of hardware  2012    Allergies: Reviewed, no changes. Allergen/Ingredient Brand Reaction  PENICILLINS    HYDROCODONE  Itching   Social History   The patient is right-handed.    MARITAL STATUS / FAMILY / SOCIAL SUPPORT: Currently S.    TOBACCO: Smoking status: Current every day smoker.  ALCOHOL: There is no history of alcohol use.   Review of Systems System Negative Positive    Constitutional Fatigue, fever and night sweats.       HEENT Vision loss.       Respiratory Cough and dyspnea.       Cardiovascular Chest pain, cyanosis and irregular heartbeat/palpitations.       Gastrointestinal Constipation, diarrhea and vomiting.       Genitourinary Dysuria and hematuria.       Metabolic / Endocrine Cold intolerance and heat intolerance.       Neuro / Psychiatric Difficulty walking, dizziness and headache.  Anxiety and depression.       Dermatologic Rash.   Itching skin.      Musculoskeletal Except as noted in hpi and chief complaint.       Hematology Bruising and easy bleeding.       Immunology Environmental allergies and food allergies.       Vital Signs:  Time BP mm/Hg Pulse/min Resp/min Temp F Ht ft Ht in Wt lb BMI kg/m2 BSA m2  10:02 AM 110/75 102   6.0 1.00 209.00 27.57    Measured By: Time   10:02 AM Smiley Houseman   Physical Exam General General Appearance: age appropriate Lower Extremity  Right Left Hip Flex: 5/5 5/5 Knee  Ext: 5/5 5/5 Dorsiflex: 5/5 5/5 Plantarflex: 5/5 5/5 EHL: 5/5 5/5 Sensory Sensation was tested at L1-S1 and all findings are normal. Tests  Right Left SLR (seated): positive negative Femoral Stretch: positive negative Constitutional:   Patient appears well nourished, well developed. No apparent distress. Eyes:   Pupils are equal and reactive to light.  Conjunctiva and lids are normal. Respiratory:   Lungs clear to auscultation bilaterally.  No wheezes, rails or rhonchi. Cardiovascular:   Regular rate and rhythm.  No murmurs and no extra sounds.  No edema or varicosities noted. Abdomen:   Soft and non tender, normal bowel sounds. Lumbar Spine Evaluation Palpation: Max tenderness: L2-3, L3-4 Psychiatric:   Alert and orientated to time, place and person.  Normal mood and affect.  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 Davonna Belling.  The L3-4 level is not fused, which had not been my perception prior tothe implant removal.  The L2-3 level is spondylotic.  The L5-S1 facets are not terribly arthritic.  The posterior element hypertrophy mentioned by Dr. Benard Rink in his report is the result of boney fusion involving the L5  lamina.   At this point I think, especially in view of his L3 consistent symptoms that we have no choice but to offer him another operation to repair the pseudoarthrosis.  He wants to go forward and we will estend the fusion proximally to L2.  He should be booked for revision laminectomy/L2-3 and L4-5 fusion. He will be admitted to [facility] for 3-4 days for post-op care.  No bending, stoopping, or lifting.  Walk for exercise.  He will be placed on Norco 10mg  1-2 q4h PRN and Robaxin 500mg  1-2 q6, and he will continue his regular home medications.  He will f/u in the office 4  weeks.    Medications prescribed or renewed this visit: Brand Dose Sig Desc  NORCO 5 mg-325 mg take 1 tablet by ORAL route  every 12 hours as needed for pain, max 2/day    Provider:  Mosetta Pigeon PA-C Encounter submitted for review by Mosetta Pigeon PA-C on 10/16/2011 10:17 AM. .  Document generated by:  Willa Rough. [place], PA-C  10/16/2011 [address]    [place], [place] [address]             Phone: [phone]     Fax: [phone]

## 2011-10-19 MED ORDER — VANCOMYCIN HCL IN DEXTROSE 1-5 GM/200ML-% IV SOLN
1000.0000 mg | Freq: Two times a day (BID) | INTRAVENOUS | Status: DC
  Administered 2011-10-20: 1000 mg via INTRAVENOUS
  Filled 2011-10-19: qty 200

## 2011-10-20 ENCOUNTER — Encounter (HOSPITAL_COMMUNITY): Payer: Self-pay | Admitting: Anesthesiology

## 2011-10-20 ENCOUNTER — Inpatient Hospital Stay (HOSPITAL_COMMUNITY): Payer: Medicare Other

## 2011-10-20 ENCOUNTER — Encounter (HOSPITAL_COMMUNITY)
Admission: RE | Disposition: A | Discharge status: Home / Self Care | Payer: Self-pay | Source: Ambulatory Visit | Attending: Orthopedic Surgery

## 2011-10-20 ENCOUNTER — Inpatient Hospital Stay (HOSPITAL_COMMUNITY): Payer: Medicare Other | Admitting: Anesthesiology

## 2011-10-20 ENCOUNTER — Inpatient Hospital Stay (HOSPITAL_COMMUNITY)
Admission: RE | Admit: 2011-10-20 | Discharge: 2011-10-24 | DRG: 460 | Disposition: A | Discharge status: Home / Self Care | Payer: Medicare Other | Source: Ambulatory Visit | Attending: Orthopedic Surgery | Admitting: Orthopedic Surgery

## 2011-10-20 ENCOUNTER — Encounter (HOSPITAL_COMMUNITY): Payer: Self-pay | Admitting: *Deleted

## 2011-10-20 ENCOUNTER — Encounter (HOSPITAL_COMMUNITY): Payer: Self-pay

## 2011-10-20 DIAGNOSIS — Y831 Surgical operation with implant of artificial internal device as the cause of abnormal reaction of the patient, or of later complication, without mention of misadventure at the time of the procedure: Secondary | ICD-10-CM | POA: Diagnosis present

## 2011-10-20 DIAGNOSIS — L270 Generalized skin eruption due to drugs and medicaments taken internally: Secondary | ICD-10-CM | POA: Diagnosis not present

## 2011-10-20 DIAGNOSIS — M47817 Spondylosis without myelopathy or radiculopathy, lumbosacral region: Secondary | ICD-10-CM | POA: Diagnosis present

## 2011-10-20 DIAGNOSIS — Y92009 Unspecified place in unspecified non-institutional (private) residence as the place of occurrence of the external cause: Secondary | ICD-10-CM

## 2011-10-20 DIAGNOSIS — T40605A Adverse effect of unspecified narcotics, initial encounter: Secondary | ICD-10-CM | POA: Diagnosis present

## 2011-10-20 DIAGNOSIS — Z880 Allergy status to penicillin: Secondary | ICD-10-CM

## 2011-10-20 DIAGNOSIS — Y9210 Unspecified residential institution as the place of occurrence of the external cause: Secondary | ICD-10-CM | POA: Diagnosis present

## 2011-10-20 DIAGNOSIS — K5900 Constipation, unspecified: Secondary | ICD-10-CM | POA: Diagnosis not present

## 2011-10-20 DIAGNOSIS — F17200 Nicotine dependence, unspecified, uncomplicated: Secondary | ICD-10-CM | POA: Diagnosis present

## 2011-10-20 DIAGNOSIS — F319 Bipolar disorder, unspecified: Secondary | ICD-10-CM | POA: Diagnosis present

## 2011-10-20 DIAGNOSIS — Z01812 Encounter for preprocedural laboratory examination: Secondary | ICD-10-CM

## 2011-10-20 DIAGNOSIS — L2989 Other pruritus: Secondary | ICD-10-CM | POA: Diagnosis not present

## 2011-10-20 DIAGNOSIS — L298 Other pruritus: Secondary | ICD-10-CM | POA: Diagnosis not present

## 2011-10-20 DIAGNOSIS — T84498A Other mechanical complication of other internal orthopedic devices, implants and grafts, initial encounter: Principal | ICD-10-CM | POA: Diagnosis present

## 2011-10-20 SURGERY — POSTERIOR LUMBAR FUSION 2 LEVEL
Anesthesia: General | Site: Spine Lumbar | Wound class: Clean

## 2011-10-20 MED ORDER — PROPOFOL 10 MG/ML IV EMUL
INTRAVENOUS | Status: DC | PRN
  Administered 2011-10-20: 130 mg via INTRAVENOUS

## 2011-10-20 MED ORDER — HYDROMORPHONE HCL PF 1 MG/ML IJ SOLN
0.2500 mg | INTRAMUSCULAR | Status: DC | PRN
  Administered 2011-10-20 (×2): 0.5 mg via INTRAVENOUS

## 2011-10-20 MED ORDER — GELATIN ABSORBABLE 100 EX MISC
CUTANEOUS | Status: DC | PRN
  Administered 2011-10-20: 09:00:00 via TOPICAL

## 2011-10-20 MED ORDER — VANCOMYCIN HCL 1000 MG IV SOLR
2000.0000 mg | INTRAVENOUS | Status: DC
  Filled 2011-10-20: qty 2000

## 2011-10-20 MED ORDER — POTASSIUM CHLORIDE IN NACL 20-0.9 MEQ/L-% IV SOLN
INTRAVENOUS | Status: DC
  Filled 2011-10-20 (×5): qty 1000

## 2011-10-20 MED ORDER — METHOCARBAMOL 500 MG PO TABS
500.0000 mg | ORAL_TABLET | Freq: Four times a day (QID) | ORAL | Status: DC | PRN
  Filled 2011-10-20: qty 1

## 2011-10-20 MED ORDER — NALOXONE HCL 0.4 MG/ML IJ SOLN: 0.4000 mg | INTRAMUSCULAR | Status: DC | PRN

## 2011-10-20 MED ORDER — SODIUM CHLORIDE 0.9 % IV SOLN
INTRAVENOUS | Status: DC | PRN
  Administered 2011-10-20: 11:00:00 via INTRAVENOUS

## 2011-10-20 MED ORDER — MIDAZOLAM HCL 5 MG/5ML IJ SOLN
INTRAMUSCULAR | Status: DC | PRN
  Administered 2011-10-20 (×2): 2 mg via INTRAVENOUS

## 2011-10-20 MED ORDER — BUPIVACAINE LIPOSOME 1.3 % IJ SUSP
INTRAMUSCULAR | Status: DC | PRN
  Administered 2011-10-20: 20 mL

## 2011-10-20 MED ORDER — VANCOMYCIN HCL IN DEXTROSE 1-5 GM/200ML-% IV SOLN
1000.0000 mg | Freq: Once | INTRAVENOUS | Status: AC
  Administered 2011-10-20: 1000 mg via INTRAVENOUS
  Filled 2011-10-20: qty 200

## 2011-10-20 MED ORDER — HYDROCODONE-ACETAMINOPHEN 10-325 MG PO TABS
1.0000 | ORAL_TABLET | ORAL | Status: DC | PRN
  Filled 2011-10-20: qty 2

## 2011-10-20 MED ORDER — ONDANSETRON HCL 4 MG/2ML IJ SOLN: 4.0000 mg | Freq: Once | INTRAMUSCULAR | Status: DC | PRN

## 2011-10-20 MED ORDER — MEPERIDINE HCL 25 MG/ML IJ SOLN: 6.2500 mg | INTRAMUSCULAR | Status: DC | PRN

## 2011-10-20 MED ORDER — HYDROMORPHONE 0.3 MG/ML IV SOLN
INTRAVENOUS | Status: DC
  Administered 2011-10-20: 7.5 mg via INTRAVENOUS
  Administered 2011-10-20: 1.8 mg via INTRAVENOUS
  Administered 2011-10-21: 0.6 mg via INTRAVENOUS
  Filled 2011-10-20: qty 25

## 2011-10-20 MED ORDER — HEMOSTATIC AGENTS (NO CHARGE) OPTIME
TOPICAL | Status: DC | PRN
  Administered 2011-10-20 (×2): 1 via TOPICAL

## 2011-10-20 MED ORDER — BUPIVACAINE HCL (PF) 0.25 % IJ SOLN
INTRAMUSCULAR | Status: DC | PRN
  Administered 2011-10-20: 10 mL

## 2011-10-20 MED ORDER — METOCLOPRAMIDE HCL 10 MG PO TABS: 5.0000 mg | ORAL_TABLET | Freq: Three times a day (TID) | ORAL | Status: DC | PRN

## 2011-10-20 MED ORDER — QUETIAPINE FUMARATE 200 MG PO TABS
400.0000 mg | ORAL_TABLET | Freq: Every day | ORAL | Status: DC
  Administered 2011-10-20 – 2011-10-21 (×2): 400 mg via ORAL
  Administered 2011-10-22 – 2011-10-23 (×2): 600 mg via ORAL
  Filled 2011-10-20 (×5): qty 3
  Filled 2011-10-20: qty 2
  Filled 2011-10-20: qty 3

## 2011-10-20 MED ORDER — VANCOMYCIN HCL 1000 MG IV SOLR
INTRAVENOUS | Status: DC | PRN
  Administered 2011-10-20: 1000 mg via TOPICAL

## 2011-10-20 MED ORDER — NEOSTIGMINE METHYLSULFATE 1 MG/ML IJ SOLN
INTRAMUSCULAR | Status: DC | PRN
  Administered 2011-10-20: 3 mg via INTRAVENOUS

## 2011-10-20 MED ORDER — TRAZODONE HCL 150 MG PO TABS
300.0000 mg | ORAL_TABLET | Freq: Every day | ORAL | Status: DC
  Administered 2011-10-20: 300 mg via ORAL
  Administered 2011-10-21: 450 mg via ORAL
  Administered 2011-10-22: 300 mg via ORAL
  Administered 2011-10-23: 450 mg via ORAL
  Filled 2011-10-20 (×6): qty 3

## 2011-10-20 MED ORDER — ONDANSETRON HCL 4 MG/2ML IJ SOLN
4.0000 mg | Freq: Four times a day (QID) | INTRAMUSCULAR | Status: DC | PRN
Start: 2011-10-20 — End: 2011-10-20

## 2011-10-20 MED ORDER — DIPHENHYDRAMINE HCL 12.5 MG/5ML PO ELIX
12.5000 mg | ORAL_SOLUTION | ORAL | Status: DC | PRN
  Filled 2011-10-20: qty 10

## 2011-10-20 MED ORDER — SODIUM CHLORIDE 0.9 % IV SOLN: INTRAVENOUS | Status: DC

## 2011-10-20 MED ORDER — ONDANSETRON HCL 4 MG/2ML IJ SOLN: 4.0000 mg | Freq: Four times a day (QID) | INTRAMUSCULAR | Status: DC | PRN

## 2011-10-20 MED ORDER — INFLUENZA VIRUS VACC SPLIT PF IM SUSP
0.5000 mL | INTRAMUSCULAR | Status: DC
  Filled 2011-10-20: qty 0.5

## 2011-10-20 MED ORDER — LIDOCAINE-EPINEPHRINE (PF) 1 %-1:200000 IJ SOLN
INTRAMUSCULAR | Status: DC | PRN
  Administered 2011-10-20: 10 mL

## 2011-10-20 MED ORDER — PROPOFOL 10 MG/ML IV EMUL
INTRAVENOUS | Status: DC | PRN
  Administered 2011-10-20: 75 ug/kg/min via INTRAVENOUS

## 2011-10-20 MED ORDER — ONDANSETRON HCL 4 MG PO TABS: 4.0000 mg | ORAL_TABLET | Freq: Four times a day (QID) | ORAL | Status: DC | PRN

## 2011-10-20 MED ORDER — SODIUM CHLORIDE 0.9 % IJ SOLN: 9.0000 mL | INTRAMUSCULAR | Status: DC | PRN

## 2011-10-20 MED ORDER — ROCURONIUM BROMIDE 100 MG/10ML IV SOLN
INTRAVENOUS | Status: DC | PRN
  Administered 2011-10-20: 50 mg via INTRAVENOUS
  Administered 2011-10-20: 30 mg via INTRAVENOUS

## 2011-10-20 MED ORDER — FENTANYL CITRATE 0.05 MG/ML IJ SOLN
INTRAMUSCULAR | Status: DC | PRN
  Administered 2011-10-20 (×2): 50 ug via INTRAVENOUS
  Administered 2011-10-20: 100 ug via INTRAVENOUS
  Administered 2011-10-20: 150 ug via INTRAVENOUS
  Administered 2011-10-20 (×3): 100 ug via INTRAVENOUS

## 2011-10-20 MED ORDER — BACITRACIN-NEOMYCIN-POLYMYXIN 400-5-5000 EX OINT
TOPICAL_OINTMENT | CUTANEOUS | Status: DC | PRN
  Administered 2011-10-20: 1 via TOPICAL

## 2011-10-20 MED ORDER — SODIUM CHLORIDE 0.9 % IR SOLN
Status: DC | PRN
  Administered 2011-10-20: 1000 mL

## 2011-10-20 MED ORDER — SODIUM CHLORIDE 0.9 % IR SOLN
Status: DC | PRN
  Administered 2011-10-20: 09:00:00

## 2011-10-20 MED ORDER — DIPHENHYDRAMINE HCL 50 MG/ML IJ SOLN
12.5000 mg | Freq: Four times a day (QID) | INTRAMUSCULAR | Status: DC | PRN
  Administered 2011-10-20: 12.5 mg via INTRAVENOUS
  Administered 2011-10-21: 25 mg via INTRAVENOUS
  Filled 2011-10-20 (×3): qty 1

## 2011-10-20 MED ORDER — BUPIVACAINE LIPOSOME 1.3 % IJ SUSP
20.0000 mL | INTRAMUSCULAR | Status: DC
  Filled 2011-10-20: qty 20

## 2011-10-20 MED ORDER — SENNOSIDES-DOCUSATE SODIUM 8.6-50 MG PO TABS
1.0000 | ORAL_TABLET | Freq: Every evening | ORAL | Status: DC | PRN
  Administered 2011-10-23: 1 via ORAL
  Filled 2011-10-20: qty 1

## 2011-10-20 MED ORDER — LACTATED RINGERS IV SOLN
INTRAVENOUS | Status: DC | PRN
  Administered 2011-10-20 (×3): via INTRAVENOUS

## 2011-10-20 MED ORDER — SODIUM CHLORIDE 0.9 % IV SOLN
10.0000 mg | INTRAVENOUS | Status: DC | PRN
  Administered 2011-10-20: 25 ug/min via INTRAVENOUS

## 2011-10-20 MED ORDER — GLYCOPYRROLATE 0.2 MG/ML IJ SOLN
INTRAMUSCULAR | Status: DC | PRN
  Administered 2011-10-20: .4 mg via INTRAVENOUS

## 2011-10-20 MED ORDER — DEXTROSE 5 % IV SOLN
500.0000 mg | Freq: Four times a day (QID) | INTRAVENOUS | Status: DC | PRN
  Administered 2011-10-20: 500 mg via INTRAVENOUS
  Filled 2011-10-20: qty 5

## 2011-10-20 MED ORDER — ONDANSETRON HCL 4 MG/2ML IJ SOLN
INTRAMUSCULAR | Status: DC | PRN
  Administered 2011-10-20 (×2): 4 mg via INTRAVENOUS

## 2011-10-20 MED ORDER — SODIUM CHLORIDE 0.9 % IJ SOLN
INTRAMUSCULAR | Status: DC | PRN
  Administered 2011-10-20: 20 mL via INTRAVENOUS

## 2011-10-20 MED ORDER — PHENYLEPHRINE HCL 10 MG/ML IJ SOLN: INTRAMUSCULAR | Status: DC | PRN

## 2011-10-20 MED ORDER — METOCLOPRAMIDE HCL 5 MG/ML IJ SOLN
5.0000 mg | Freq: Three times a day (TID) | INTRAMUSCULAR | Status: DC | PRN
  Filled 2011-10-20: qty 2

## 2011-10-20 MED ORDER — DROPERIDOL 2.5 MG/ML IJ SOLN
INTRAMUSCULAR | Status: DC | PRN
  Administered 2011-10-20: 0.625 mg via INTRAVENOUS

## 2011-10-20 MED ORDER — DIPHENHYDRAMINE HCL 12.5 MG/5ML PO ELIX
12.5000 mg | ORAL_SOLUTION | Freq: Four times a day (QID) | ORAL | Status: DC | PRN
  Filled 2011-10-20: qty 5

## 2011-10-20 SURGICAL SUPPLY — 86 items
APL SKNCLS STERI-STRIP NONHPOA (GAUZE/BANDAGES/DRESSINGS)
BAG DECANTER FOR FLEXI CONT (MISCELLANEOUS) ×2 IMPLANT
BENZOIN TINCTURE PRP APPL 2/3 (GAUZE/BANDAGES/DRESSINGS) ×2 IMPLANT
BLADE SURG ROTATE 9660 (MISCELLANEOUS) IMPLANT
BUR MATCHSTICK NEURO 3.0 LAGG (BURR) ×2 IMPLANT
CARTRIDGE OIL MAESTRO DRILL (MISCELLANEOUS) ×1 IMPLANT
CLOTH BEACON ORANGE TIMEOUT ST (SAFETY) ×2 IMPLANT
CORDS BIPOLAR (ELECTRODE) ×2 IMPLANT
COVER SURGICAL LIGHT HANDLE (MISCELLANEOUS) ×2 IMPLANT
DIFFUSER DRILL AIR PNEUMATIC (MISCELLANEOUS) ×2 IMPLANT
DRAIN CHANNEL 15F RND FF W/TCR (WOUND CARE) ×2 IMPLANT
DRAIN TLS ROUND 10FR (DRAIN) IMPLANT
DRAPE C-ARM 42X72 X-RAY (DRAPES) ×1 IMPLANT
DRAPE PROXIMA HALF (DRAPES) ×8 IMPLANT
DRAPE SURG 17X23 STRL (DRAPES) ×6 IMPLANT
DRAPE TABLE COVER HEAVY DUTY (DRAPES) ×1 IMPLANT
DRSG OPSITE 11X17.75 LRG (GAUZE/BANDAGES/DRESSINGS) ×1 IMPLANT
DRSG OPSITE 6X11 MED (GAUZE/BANDAGES/DRESSINGS) ×2 IMPLANT
DRSG PAD ABDOMINAL 8X10 ST (GAUZE/BANDAGES/DRESSINGS) ×1 IMPLANT
DURAPREP 26ML APPLICATOR (WOUND CARE) ×2 IMPLANT
ELECT BLADE 4.0 EZ CLEAN MEGAD (MISCELLANEOUS) ×2
ELECT CAUTERY BLADE 6.4 (BLADE) ×2 IMPLANT
ELECT REM PT RETURN 9FT ADLT (ELECTROSURGICAL) ×2
ELECTRODE BLDE 4.0 EZ CLN MEGD (MISCELLANEOUS) ×1 IMPLANT
ELECTRODE REM PT RTRN 9FT ADLT (ELECTROSURGICAL) ×1 IMPLANT
EVACUATOR 1/8 PVC DRAIN (DRAIN) IMPLANT
EVACUATOR SILICONE 100CC (DRAIN) ×2 IMPLANT
FILTER STRAW FLUID ASPIR (MISCELLANEOUS) IMPLANT
GAUZE SPONGE 4X4 16PLY XRAY LF (GAUZE/BANDAGES/DRESSINGS) ×4 IMPLANT
GLOVE BIOGEL PI IND STRL 7.5 (GLOVE) ×1 IMPLANT
GLOVE BIOGEL PI IND STRL 9 (GLOVE) ×1 IMPLANT
GLOVE BIOGEL PI INDICATOR 7.5 (GLOVE) ×3
GLOVE BIOGEL PI INDICATOR 9 (GLOVE) ×2
GLOVE SS BIOGEL STRL SZ 6.5 (GLOVE) IMPLANT
GLOVE SS BIOGEL STRL SZ 7 (GLOVE) ×1 IMPLANT
GLOVE SS BIOGEL STRL SZ 8.5 (GLOVE) ×1 IMPLANT
GLOVE SUPERSENSE BIOGEL SZ 6.5 (GLOVE) ×3
GLOVE SUPERSENSE BIOGEL SZ 7 (GLOVE) ×3
GLOVE SUPERSENSE BIOGEL SZ 8.5 (GLOVE) ×3
GOWN PREVENTION PLUS XLARGE (GOWN DISPOSABLE) ×3 IMPLANT
GOWN STRL NON-REIN LRG LVL3 (GOWN DISPOSABLE) ×7 IMPLANT
IV CATH 14GX2 1/4 (CATHETERS) IMPLANT
KIT BASIN OR (CUSTOM PROCEDURE TRAY) ×2 IMPLANT
KIT INFUSE MEDIUM (Orthopedic Implant) ×1 IMPLANT
KIT POSITION SURG JACKSON T1 (MISCELLANEOUS) ×2 IMPLANT
KIT ROOM TURNOVER OR (KITS) ×2 IMPLANT
NDL 18GX1X1/2 (RX/OR ONLY) (NEEDLE) IMPLANT
NDL SPNL 22GX3.5 QUINCKE BK (NEEDLE) ×1 IMPLANT
NEEDLE 18GX1X1/2 (RX/OR ONLY) (NEEDLE) ×2 IMPLANT
NEEDLE 27GAX1X1/2 (NEEDLE) ×2 IMPLANT
NEEDLE SPNL 22GX3.5 QUINCKE BK (NEEDLE) ×2 IMPLANT
NS IRRIG 1000ML POUR BTL (IV SOLUTION) ×2 IMPLANT
OIL CARTRIDGE MAESTRO DRILL (MISCELLANEOUS) ×2
PACK LAMINECTOMY ORTHO (CUSTOM PROCEDURE TRAY) ×2 IMPLANT
PACK UNIVERSAL I (CUSTOM PROCEDURE TRAY) ×2 IMPLANT
PAD ARMBOARD 7.5X6 YLW CONV (MISCELLANEOUS) ×2 IMPLANT
PATTIES SURGICAL .5 X1 (DISPOSABLE) ×2 IMPLANT
PATTIES SURGICAL .75X.75 (GAUZE/BANDAGES/DRESSINGS) IMPLANT
PATTIES SURGICAL 1X1 (DISPOSABLE) IMPLANT
SPONGE GAUZE 4X4 12PLY (GAUZE/BANDAGES/DRESSINGS) ×2 IMPLANT
SPONGE NEURO XRAY DETECT 1X3 (DISPOSABLE) IMPLANT
SPONGE SURGIFOAM ABS GEL 100 (HEMOSTASIS) ×1 IMPLANT
STRIP CLOSURE SKIN 1/2X4 (GAUZE/BANDAGES/DRESSINGS) ×2 IMPLANT
SURGIFLO TRUKIT (HEMOSTASIS) ×4 IMPLANT
SUT ETHILON 2 0 FS 18 (SUTURE) ×4 IMPLANT
SUT VIC AB 0 CT1 18XCR BRD 8 (SUTURE) IMPLANT
SUT VIC AB 0 CT1 8-18 (SUTURE) ×4
SUT VIC AB 1 CT1 18XCR BRD 8 (SUTURE) ×5 IMPLANT
SUT VIC AB 1 CT1 8-18 (SUTURE) ×10
SUT VIC AB 1 CTX 18 (SUTURE) ×2 IMPLANT
SUT VIC AB 1 CTX 36 (SUTURE) ×6
SUT VIC AB 1 CTX36XBRD ANBCTR (SUTURE) ×3 IMPLANT
SUT VIC AB 2-0 CT1 18 (SUTURE) ×2 IMPLANT
SUT VIC AB 2-0 CT1 27 (SUTURE) ×4
SUT VIC AB 2-0 CT1 TAPERPNT 27 (SUTURE) ×2 IMPLANT
SUT VIC AB 3-0 X1 27 (SUTURE) ×4 IMPLANT
SYR 3ML LL SCALE MARK (SYRINGE) IMPLANT
SYR BULB IRRIGATION 50ML (SYRINGE) ×2 IMPLANT
SYR CONTROL 10ML LL (SYRINGE) ×2 IMPLANT
SYRINGE 10CC LL (SYRINGE) ×2 IMPLANT
SYSTEM CHEST DRAIN TLS 7FR (DRAIN) IMPLANT
TOWEL OR 17X24 6PK STRL BLUE (TOWEL DISPOSABLE) ×2 IMPLANT
TOWEL OR 17X26 10 PK STRL BLUE (TOWEL DISPOSABLE) ×2 IMPLANT
TRAY FOLEY CATH 14FR (SET/KITS/TRAYS/PACK) ×2 IMPLANT
TUBE SUCT ARGYLE STRL (TUBING) ×2 IMPLANT
WATER STERILE IRR 1000ML POUR (IV SOLUTION) ×4 IMPLANT

## 2011-10-20 NOTE — Anesthesia Postprocedure Evaluation (Signed)
  Anesthesia Post-op Note  Patient: [name]  Procedure(s) Performed:  POSTERIOR LUMBAR FUSION 2 LEVEL - L2-3/L3-4 Posterior Spinal Fusion With K2M Screws, Rods,and Iliac Crest Bone Graft   Patient Location: PACU  Anesthesia Type: General  Level of Consciousness: awake  Airway and Oxygen Therapy: Patient connected to nasal cannula oxygen  Post-op Pain: none  Post-op Assessment: Post-op Vital signs reviewed  Post-op Vital Signs: stable  Complications: No apparent anesthesia complications

## 2011-10-20 NOTE — Anesthesia Procedure Notes (Addendum)
Procedure Name: Intubation Date/Time: 10/20/2011 7:49 AM Performed by: Wray Kearns A Pre-anesthesia Checklist: Patient identified, Timeout performed, Emergency Drugs available, Suction available and Patient being monitored Patient Re-evaluated:Patient Re-evaluated prior to inductionOxygen Delivery Method: Circle System Utilized Preoxygenation: Pre-oxygenation with 100% oxygen Intubation Type: IV induction Ventilation: Mask ventilation without difficulty Laryngoscope Size: Miller and 3 Grade View: Grade II Tube type: Oral Tube size: 7.5 mm Number of attempts: 1 Airway Equipment and Method: stylet Placement Confirmation: ETT inserted through vocal cords under direct vision,  positive ETCO2,  CO2 detector and breath sounds checked- equal and bilateral Secured at: 23 cm Tube secured with: Tape Dental Injury: Teeth and Oropharynx as per pre-operative assessment    Performed by: Elisabeth Most

## 2011-10-20 NOTE — Progress Notes (Signed)
Seen in PAC, conscious, moves both feet to command.

## 2011-10-20 NOTE — Anesthesia Preprocedure Evaluation (Signed)
Anesthesia Evaluation    Reviewed: Allergy & Precautions, H&P , Patient's Chart, lab work & pertinent test results  Airway       Dental   Pulmonary          Cardiovascular     Neuro/Psych    GI/Hepatic   Endo/Other    Renal/GU      Musculoskeletal   Abdominal   Peds  Hematology   Anesthesia Other Findings   Reproductive/Obstetrics                           Anesthesia Physical Anesthesia Plan  ASA: II  Anesthesia Plan: General   Post-op Pain Management:    Induction:   Airway Management Planned:   Additional Equipment:   Intra-op Plan:   Post-operative Plan:   Informed Consent: I have reviewed the patients History and Physical, chart, labs and discussed the procedure including the risks, benefits and alternatives for the proposed anesthesia with the patient or authorized representative who has indicated his/her understanding and acceptance.     Plan Discussed with: CRNA and Surgeon  Anesthesia Plan Comments:         Anesthesia Quick Evaluation

## 2011-10-20 NOTE — Plan of Care (Signed)
Problem: Consults Goal: Diagnosis - Spinal Surgery Thoraco/Lumbar Spine Fusion  Problem: Phase I Progression Outcomes Goal: PT/OT consults requested Outcome: Completed/Met Date Met:  10/20/11 PT ordered

## 2011-10-20 NOTE — Interval H&P Note (Signed)
History and Physical Interval Note:   10/20/2011   7:28 AM   [name]  has presented today for surgery, with the diagnosis of Arthrodesis and Spondylosis   The various methods of treatment have been discussed with the patient and family. After consideration of risks, benefits and other options for treatment, the patient has consented to  Procedure(s): POSTERIOR LUMBAR FUSION 2 LEVEL as a surgical intervention .  The patients' history has been reviewed, patient examined, no change in status, stable for surgery.  I have reviewed the patients' chart and labs.  Questions were answered to the patient's satisfaction.     Charlsie Quest  MD

## 2011-10-20 NOTE — Preoperative (Signed)
Beta Blockers   Reason not to administer Beta Blockers:Not Applicable 

## 2011-10-20 NOTE — Transfer of Care (Signed)
Immediate Anesthesia Transfer of Care Note  Patient: [name]  Procedure(s) Performed:  POSTERIOR LUMBAR FUSION 2 LEVEL - L2-3/L3-4 Posterior Spinal Fusion With K2M Screws, Rods,and Iliac Crest Bone Graft   Patient Location: PACU  Anesthesia Type: General  Level of Consciousness: sedated and lethargic  Airway & Oxygen Therapy: Patient Spontanous Breathing and Patient connected to face mask oxygen  Post-op Assessment: Report given to PACU RN, Post -op Vital signs reviewed and stable and Patient moving all extremities  Post vital signs: Reviewed and stable  Complications: No apparent anesthesia complications

## 2011-10-20 NOTE — Op Note (Signed)
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  OPERATIVE PROCEDURE:  Repair of pseudoarthrosis at L3-4 (L3-4 posterior fusion), L2-3 posterior fusion; re-insertion pedicle screws bilaterally at L5, L3, L4, insertion of pedicle screws at L2 bilaterally; placement of locally harvested autogenous graft and infuse from L2 to L5.  OPERATIVE NOTE:  The patient was placed under general endotracheal anesthesia.  A Foley catheter was placed in the bladder.  Vancomycin was administered intravenously.  Sequential compression devices were placed on both lower extremities.  He was placed prone on a radiolucent Jackson frame.  Care was taken to position the upper extremities so as to avoid hyperflexion and abduction of shoulders so as to avoid hyperflexion of the elbows.  The thighs, knees, shins and ankles were supported on pillows.  Prior to positioning, the neurological function monitoring tech had attached the electrodes to both upper and lower extremities and scalp.  The previously made midline incision was marked with a skin marker and vertical extension marked in the skin which just to be the midline over the upper lumbar spine.  The lumbar area was prepped using DuraPrep and draped in a rectangular fashion.  The drapes secured with Ioban.  A time-out was held, at which point the usual parameters were discussed/confirmed.  The skin was  scored in elliptical fashion around the previous midline scar and this skin scored vertically in line with the incision.  The subdermal/subcutaneous tissue was injected with a mixture of 0.25% plain Marcaine and 1% lidocaine with epinephrine.  Using cutting current, a scar was reexcised in elliptical fashion and the incision extended about 4 cm proximally.  Dissection was carried down onto the spinous processes of the mid and proximal lumbar spine using cutting current.  Paraspinal muscles were mobilized bilaterally to the transverse processes of L2 and the fusion mass beginning posterolaterally at L3.  By this point, we had actually taken a cross-table lateral radiograph of the needle placed, which marked approximately the L4-5 disk level.  We then exposed the previously operated on area bilaterally down to the L5 pedicles.  We then arranged to have a C-arm fluoroscopy unit brought into the room and it was draped.  We then were able to identify in the AP projection the pedicle holes at L5, L4 and L3, from which previously pedicle screws had been removed.  Having identified the L5 hole on the left side, the hole was tapped in a 7.5-mm screw of appropriate length placed. Similarly we identified using AP fluoroscopy the L4 and L3 pedicles on the left.  We then placed pedicle screws in those holes.  We  then placed a pedicle screw at L2 using the usual landmarks, junction of the superior articular process and transverse process to identify the starting point and placed a 6 x 6.5 mm pedicle screw of appropriate length, based on depth measurement.  In each instance, the holes were palpated circumferentially with a ball-tip probe to make sure there were no breaches.  The position of all screws was then checked with a AP and lateral fluoroscopic guidance.  We then essentially did exactly the same thing on the patient's right side, again checking the position of the screws of AP and  lateral position with fluoroscopy.  Therefore, in summary, we had placed/three inserted pedicle screws bilaterally at L5, L4 and L3, and placed new pedicle screws at L2 bilaterally.  All the screws were stimulated and distal EMG activity was not observed in any below the safe threshold, meaning the likelihood of direct contact between screw and any nerve root was highly unlikely.  At this point, we prepared the fusion bed by resecting the articular surfaces of L2-3 bilaterally and by decorticating the lamina with the combination of osteotomes and high-speed bur.  The fusion mass particularly on the right side at L4 was identified and decorticated and residual bone from the L3 lamina was decorticated.  We had been harvesting bone throughout the procedure and this was morselized by the surgical scrub tech.  We had asked for a medium package of infuse and the collagen fiber sponge had been soaked with the solution for greater than 15 or 20 minutes.  We then placed a sponge over the area to be fused at L2 to L4, packed bone graft with superficial wet sponge and placed another sponge superficial to the bone graft.  This was done on both sides.  We then placed strips of dry Gelfoam over the graft/infuse in order to prevent it from migrating into the wound.  We then used 2 precontoured titanium rods and attached them to the screws on either side.  All couplings were torqued.  Cross-table, lateral, and AP radiographs were taken with the portable x-ray machine and showed satisfactory position of all implants.  Prior to closing, I checked to make sure that on the right side, where a slightly shorter rod had been used that there was a rod extending proximally and distally, and there was.  As noted, all couplings had been torqued at this point.  We then placed a 15-gauge Blake drain subfascially and brought out through the skin to the right side and was secured with a 2-0 nylon suture.  1 g of  vancomycin powder was dusted into the wound.  We then closed the paraspinal muscle and fascia using interrupted number 1 Vicryl sutures.  Because of some scarring between the fascia and the subcutaneous tissue and in fact the subcutaneous tissue was very thin, I undermined about 1 cm on either side of the incision to allow for advancement of the skin underlying subcutaneous tissue.  The subcutaneous layer was then closed using interrupted inverted 0 Vicryl sutures.  The skin was then closed using interrupted vertical mattress sutures of 2-0 nylon.  Prior to closing the subcutaneous layer, one 8-inch Hemovac drain was placed subcutaneously and brought out through the skin to the right side and again secured with a 2-0 nylon suture.  An nonadherent dressing was then applied.  The patient was returned to his bed and moved to recovery room.  He has not yet been examined there.  Sponge and needle counts were  correct.  There were no intraoperative complications.  The blood loss was estimated at 450 mL and return of Cell Saver blood was about 200 mL.     Nelda Severe, MD     MT/MEDQ  D:  10/20/2011  T:  10/20/2011  Job:  [id number]

## 2011-10-20 NOTE — Brief Op Note (Signed)
10/20/2011  12:15 PM  PATIENT:  [name]  55 y.o. 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 Collins, PA-C  ANESTHESIA:   general  EBL:  Total I/O In: 2990 [I.V.:2800; Blood:190] Out: 725 [Urine:225; Blood:500]  BLOOD ADMINISTERED:200 CC CELLSAVER  DRAINS: Harrison Mons) Blake drain(s) in the    LOCAL MEDICATIONS USED:  OTHER Exprel  SPECIMEN:  No Specimen  DISPOSITION OF SPECIMEN:  N/A  COUNTS:  YES  TOURNIQUET:  * No tourniquets in log *  DICTATION: .Other Dictation: Dictation Number [id number]  PLAN OF CARE: Admit to inpatient   PATIENT DISPOSITION:  PACU - hemodynamically stable.   Delay start of Pharmacological VTE agent (>24hrs) due to surgical blood loss or risk of bleeding:  {YES/NO/NOT APPLICABLE:20182

## 2011-10-21 ENCOUNTER — Encounter (HOSPITAL_COMMUNITY): Payer: Self-pay | Admitting: General Practice

## 2011-10-21 LAB — CBC
HCT: 32.6 % — ABNORMAL LOW (ref 39.0–52.0)
Hemoglobin: 11.3 g/dL — ABNORMAL LOW (ref 13.0–17.0)
MCH: 32.2 pg (ref 26.0–34.0)
MCHC: 34.7 g/dL (ref 30.0–36.0)
RBC: 3.51 MIL/uL — ABNORMAL LOW (ref 4.22–5.81)

## 2011-10-21 LAB — BASIC METABOLIC PANEL
BUN: 12 mg/dL (ref 6–23)
CO2: 29 mEq/L (ref 19–32)
Chloride: 100 mEq/L (ref 96–112)
Glucose, Bld: 182 mg/dL — ABNORMAL HIGH (ref 70–99)
Potassium: 4 mEq/L (ref 3.5–5.1)
Sodium: 137 mEq/L (ref 135–145)

## 2011-10-21 MED ORDER — GABAPENTIN 600 MG PO TABS
600.0000 mg | ORAL_TABLET | Freq: Three times a day (TID) | ORAL | Status: DC
  Administered 2011-10-21 – 2011-10-24 (×11): 600 mg via ORAL
  Filled 2011-10-21 (×13): qty 1

## 2011-10-21 MED ORDER — INFLUENZA VIRUS VACC SPLIT PF IM SUSP
0.5000 mL | Freq: Once | INTRAMUSCULAR | Status: AC
  Administered 2011-10-21: 0.5 mL via INTRAMUSCULAR
  Filled 2011-10-21: qty 0.5

## 2011-10-21 MED ORDER — HYDROMORPHONE 0.3 MG/ML IV SOLN
INTRAVENOUS | Status: DC
  Administered 2011-10-21: 0.9 mg via INTRAVENOUS

## 2011-10-21 MED ORDER — MORPHINE SULFATE 4 MG/ML IJ SOLN: 4.0000 mg | INTRAMUSCULAR | Status: DC | PRN

## 2011-10-21 MED ORDER — SODIUM CHLORIDE 0.9 % IJ SOLN: 9.0000 mL | INTRAMUSCULAR | Status: DC | PRN

## 2011-10-21 MED ORDER — NALOXONE HCL 0.4 MG/ML IJ SOLN: 0.4000 mg | INTRAMUSCULAR | Status: DC | PRN

## 2011-10-21 MED ORDER — DIPHENHYDRAMINE HCL 50 MG/ML IJ SOLN
25.0000 mg | Freq: Four times a day (QID) | INTRAMUSCULAR | Status: DC | PRN
  Administered 2011-10-21: 25 mg via INTRAVENOUS
  Filled 2011-10-21: qty 1

## 2011-10-21 NOTE — Progress Notes (Signed)
Physical Therapy Evaluation Patient Details Name: [name] MRN: [id number] DOB: 1955/12/26 Today's Date: 10/21/2011  Problem List:  Patient Active Problem List  Diagnoses  . CELLULITIS AND ABSCESS OF LEG EXCEPT FOOT    Past Medical History:  Past Medical History  Diagnosis Date  . Pneumonia     hx of-about 17yrs ago  . Chronic back pain   . History of colon polyps   . Mental disorder     bipolar   Past Surgical History:  Past Surgical History  Procedure Date  . Shoulder surgery 70's    right  . Appendectomy     early 61's  . Blood clot in groin early 80's  . Back surgery 05/2011    x 5  . Hernia repair     early 2000's  . Carpal tunnel release     left  . Cervical spine surgery 2009  . Colonoscopy     PT Assessment/Plan/Recommendation PT Assessment Clinical Impression Statement: Pt presents with a medical diagnosis of L3-5 fusion along with the impairments/deficits listed below. Pt will benefit from skilled PT in the acute care setting in order to maximize functional mobility for a safe d/c home. PT Recommendation/Assessment: Patient will need skilled PT in the acute care venue PT Problem List: Decreased activity tolerance;Decreased balance;Decreased mobility;Decreased knowledge of use of DME;Decreased knowledge of precautions;Pain Barriers to Discharge: Decreased caregiver support Barriers to Discharge Comments: Pt has hired [employer] fund[employer] PT Therapy Diagnosis : Difficulty walking;Acute pain PT Plan PT Frequency: Min 5X/week PT Treatment/Interventions: DME instruction;Gait training;Stair training;Functional mobility training;Therapeutic exercise;Balance training;Patient/family education PT Recommendation Follow Up Recommendations: Home health PT Equipment Recommended: Rolling walker with 5" wheels PT Goals  Acute Rehab PT Goals PT Goal Formulation: With patient Time For Goal Achievement: 7 days Pt will go Supine/Side to Sit: with modified independence PT Goal:  Supine/Side to Sit - Progress: Progressing toward goal Pt will go Sit to Supine/Side: with modified independence PT Goal: Sit to Supine/Side - Progress: Progressing toward goal Pt will Transfer Sit to Stand/Stand to Sit: with modified independence PT Transfer Goal: Sit to Stand/Stand to Sit - Progress: Progressing toward goal Pt will Transfer Bed to Chair/Chair to Bed: with supervision PT Transfer Goal: Bed to Chair/Chair to Bed - Progress: Progressing toward goal Pt will Ambulate: >150 feet;with supervision;with least restrictive assistive device PT Goal: Ambulate - Progress: Progressing toward goal Pt will Go Up / Down Stairs: 6-9 stairs;with rail(s);with supervision PT Goal: Up/Down Stairs - Progress: Other (comment) (unassessed today)  PT Evaluation Precautions/Restrictions    Prior Functioning  Home Living Lives With: Alone Receives Help From: Other (Comment) (Nurse(hired)) Type of Home: Apartment Home Layout: One level Home Access: Stairs to enter Entrance Stairs-Rails: Can reach both Entrance Stairs-Number of Steps: 6 Bathroom Shower/Tub: Walk-in Contractor: Standard Bathroom Accessibility: Yes How Accessible: Accessible via walker Prior Function Level of Independence: Independent with basic ADLs;Independent with transfers;Independent with gait (used walking stick) Able to Take Stairs?: Yes Driving: No Vocation: On disability Cognition Cognition Arousal/Alertness: Awake/alert Overall Cognitive Status: Appears within functional limits for tasks assessed Orientation Level: Oriented X4 Sensation/Coordination Sensation Light Touch: Appears Intact Extremity Assessment RLE Assessment RLE Assessment: Within Functional Limits LLE Assessment LLE Assessment: Within Functional Limits Mobility (including Balance) Bed Mobility Bed Mobility: Yes Rolling Right: 4: Min assist Rolling Right Details (indicate cue type and reason): VC for sequencing to  maintain back precautions. Minimal assist with trunk support Right Sidelying to Sit: 4: Min assist Right Sidelying to Sit  Details (indicate cue type and reason): VC for sequencing with back precautions Transfers Transfers: Yes Sit to Stand: 4: Min assist Sit to Stand Details (indicate cue type and reason): VC for hand placement and sequencing Stand to Sit: 4: Min assist Stand to Sit Details: VC for hand placement Ambulation/Gait Ambulation/Gait: Yes Ambulation/Gait Assistance: 4: Min assist;5: Supervision (Supervision-Minguard Assist) Ambulation/Gait Assistance Details (indicate cue type and reason): VC for gait sequencing.  Ambulation Distance (Feet): 100 Feet Assistive device: Rolling walker Gait Pattern: Within Functional Limits Gait velocity: Decreased gait speed Stairs: No    Exercise    End of Session PT - End of Session Equipment Utilized During Treatment: Gait belt Activity Tolerance: Patient tolerated treatment well Patient left: in chair;with call bell in reach Nurse Communication: Mobility status for transfers;Mobility status for ambulation General Behavior During Session: [facility] for tasks performed Cognition: [facility] for tasks performed  Milana Kidney 10/21/2011, 9:57 AM  10/21/2011 Milana Kidney DPT PAGER: [phone] OFFICE: [phone]

## 2011-10-21 NOTE — Progress Notes (Signed)
Patient ID: [name], male   DOB: January 18, 1956, 55 y.o.   MRN: [id number] Vital signs stable, afebrile, Blood work results noted.  Major complaint is of itching for which benadryl was ordered last night.  This helps a little.  He says he's tolerated Dilaudid well in the past.  OE: lying in bed, drain being emptied by nursing staff - serosanguinous in appearance  Assessment: Itching probably secondary to Dilaudid  Plan: Discontinue Dilaudid PCA, Continue Benadryl, Continue leg squeezers

## 2011-10-22 LAB — CBC
MCHC: 34.4 g/dL (ref 30.0–36.0)
Platelets: 127 10*3/uL — ABNORMAL LOW (ref 150–400)
RDW: 11.6 % (ref 11.5–15.5)
WBC: 11.1 10*3/uL — ABNORMAL HIGH (ref 4.0–10.5)

## 2011-10-22 LAB — BASIC METABOLIC PANEL
Calcium: 8.8 mg/dL (ref 8.4–10.5)
Chloride: 100 mEq/L (ref 96–112)
Creatinine, Ser: 0.76 mg/dL (ref 0.50–1.35)
GFR calc Af Amer: >90 mL/min (ref >90)
GFR calc non Af Amer: >90 mL/min (ref >90)

## 2011-10-22 MED ORDER — OXYCODONE-ACETAMINOPHEN 5-325 MG PO TABS
1.0000 | ORAL_TABLET | ORAL | Status: DC | PRN
  Filled 2011-10-22 (×2): qty 1

## 2011-10-22 MED ORDER — OXYCODONE HCL 5 MG PO TABS
5.0000 mg | ORAL_TABLET | ORAL | Status: DC | PRN
  Filled 2011-10-22 (×2): qty 1

## 2011-10-22 NOTE — Progress Notes (Signed)
Physical Therapy Treatment Patient Details Name: [name] MRN: [id number] DOB: May 16, 1956 Today's Date: 10/22/2011  PT Assessment/Plan  PT - Assessment/Plan Comments on Treatment Session: Pt with excellent progress but remains unable to recall >2/3 precautions despite education throughout tx. Pt ambulating well and encouraged to begin attempting bed mobility without rail and to walk in halls with nursing throughout the day. PT Plan: Discharge plan remains appropriate Equipment Recommended: None recommended by PT PT Goals  Acute Rehab PT Goals Pt will go Supine/Side to Sit: with HOB 0 degrees;with modified independence (without rail) PT Goal: Supine/Side to Sit - Progress: Progressing toward goal PT Transfer Goal: Sit to Stand/Stand to Sit - Progress: Progressing toward goal PT Goal: Ambulate - Progress: Met PT Goal: Up/Down Stairs - Progress: Met  PT Treatment Precautions/Restrictions  Precautions Precautions: Back Required Braces or Orthoses: No Restrictions Weight Bearing Restrictions: No Mobility (including Balance) Bed Mobility Rolling Right: 5: Supervision;With rail Rolling Right Details (indicate cue type and reason): cueing for sequence, unable to complete without rail despite cues Right Sidelying to Sit: 5: Supervision Right Sidelying to Sit Details (indicate cue type and reason): cueing for sequence and precautions Transfers Transfers: Yes Sit to Stand: 5: Supervision;From bed Sit to Stand Details (indicate cue type and reason): cueing for hand placement Stand to Sit: 6: Modified independent (Device/Increase time);To chair/3-in-1 Ambulation/Gait Ambulation/Gait Assistance: 6: Modified independent (Device/Increase time) Ambulation/Gait Assistance Details (indicate cue type and reason): cueing for posture Ambulation Distance (Feet): 300 Feet Assistive device: None Gait Pattern: Decreased stride length Stairs: Yes Stairs Assistance: 6: Modified independent  (Device/Increase time) Stair Management Technique: One rail Right Number of Stairs: 6  Height of Stairs: 8     Exercise    End of Session PT - End of Session Activity Tolerance: Patient tolerated treatment well Patient left: in chair;with call bell in reach Nurse Communication: Mobility status for ambulation;Mobility status for transfers General Behavior During Session: [facility] for tasks performed Cognition: [facility] for tasks performed  Delorse Lek 10/22/2011, 10:00 AM  Toney Sang, PT [phone]

## 2011-10-22 NOTE — Progress Notes (Signed)
Patient ID: [name], male   DOB: September 18, 1956, 55 y.o.   MRN: [id number] Vital Signs stable, Afebrile,  WBC trending down compared to yesterday, Has been up with PT He refused Norco because he thought it was causing itching.  Coleen Mahar, PA-C discontinued Norco and ordered Oxycodone IR which according to his nurse, he has been refusing.  He is taking only Neurontin.  Has only had one episode of itching today.  Drains functioning; 150 cc Blake and 75 cc hemovac  O/E:  Dressing (recently replaced) is dry.  He has good bil lower ext. Strength, no ankle/foot swelling bil.  Ass't: Stable  Plan:  Retain drains until tomorrow.  No change in analgesia.  [place]ock IV

## 2011-10-23 LAB — BASIC METABOLIC PANEL
Chloride: 99 mEq/L (ref 96–112)
GFR calc Af Amer: >90 mL/min (ref >90)
GFR calc non Af Amer: >90 mL/min (ref >90)
Potassium: 3.8 mEq/L (ref 3.5–5.1)
Sodium: 135 mEq/L (ref 135–145)

## 2011-10-23 LAB — CBC
HCT: 30.2 % — ABNORMAL LOW (ref 39.0–52.0)
Hemoglobin: 10.4 g/dL — ABNORMAL LOW (ref 13.0–17.0)
WBC: 9.6 10*3/uL (ref 4.0–10.5)

## 2011-10-23 MED ORDER — BISACODYL 10 MG RE SUPP
10.0000 mg | Freq: Every day | RECTAL | Status: DC | PRN
  Administered 2011-10-23: 10 mg via RECTAL
  Filled 2011-10-23: qty 1

## 2011-10-23 NOTE — Progress Notes (Signed)
Physical Therapy Treatment Patient Details Name: ITAMAR MCGOWAN MRN: [id number] DOB: 1955/12/03 Today's Date: 10/23/2011  PT Assessment/Plan  PT - Assessment/Plan Comments on Treatment Session: Progressing very well. Unable to recall precautions. Pt given  PT Plan: All goals met and education completed, patient dischaged from PT services PT Frequency: Min 5X/week Follow Up Recommendations: Home health PT Equipment Recommended: None recommended by PT PT Goals  Acute Rehab PT Goals PT Goal: Supine/Side to Sit - Progress: Met PT Goal: Sit to Supine/Side - Progress: Other (comment) (not tested) PT Transfer Goal: Sit to Stand/Stand to Sit - Progress: Met PT Transfer Goal: Bed to Chair/Chair to Bed - Progress: Met PT Goal: Ambulate - Progress: Met PT Goal: Up/Down Stairs - Progress: Met  PT Treatment Precautions/Restrictions  Precautions Precautions: Back Precaution Booklet Issued: Yes (comment) Precaution Comments: Reviewed all precautions with patient as he was unable to recall Required Braces or Orthoses: No Restrictions Weight Bearing Restrictions: No Mobility (including Balance) Bed Mobility Bed Mobility: Yes Rolling Right: 6: Modified independent (Device/Increase time) Right Sidelying to Sit: 6: Modified independent (Device/Increase time) Transfers Transfers: Yes Sit to Stand: 6: Modified independent (Device/Increase time) Stand to Sit: 6: Modified independent (Device/Increase time) Ambulation/Gait Ambulation/Gait: Yes Ambulation/Gait Assistance: 5: Supervision Ambulation/Gait Assistance Details (indicate cue type and reason): S for safety Ambulation Distance (Feet): 210 Feet Assistive device: None Gait Pattern: Antalgic;Within Functional Limits Stairs: Yes Stairs Assistance: 6: Modified independent (Device/Increase time) Stair Management Technique: One rail Right Number of Stairs: 10  Wheelchair Mobility Wheelchair Mobility: No    Exercise    End of Session PT  - End of Session Equipment Utilized During Treatment: Gait belt Activity Tolerance: Patient tolerated treatment well Patient left: in chair;with call bell in reach Nurse Communication: Mobility status for transfers;Mobility status for ambulation General Behavior During Session: [facility] for tasks performed Cognition: [facility] for tasks performed  Minie Roadcap, Adline Potter 10/23/2011, 1:26 PM 10/23/2011 Fredrich Birks PTA [phone] pager [phone] office

## 2011-10-23 NOTE — Progress Notes (Signed)
Seen and agree with PTA note Toney Sang, PT [phone]

## 2011-10-23 NOTE — Progress Notes (Signed)
Patient continues constipated; given suppository and Senokot. Hypo BS and distended abdomen

## 2011-10-23 NOTE — Progress Notes (Signed)
Vital signs stable, afebrile      C/O: constipation, no BM since surgery.  He is taking no analgesics because he wants to avoid the itching he experienced post-op   O/E:  Dressing dry, drains have been removed, moves lower extremities well bilaterallyl    Assessment: constipated, otherwise stable    Plan: Meds for constipation, probable discharde tomorrow.    [name],S Kursten Kruk

## 2011-10-24 MED ORDER — GABAPENTIN 600 MG PO TABS: 600.0000 mg | ORAL_TABLET | Freq: Three times a day (TID) | ORAL | Status: DC

## 2011-10-24 NOTE — Discharge Summary (Signed)
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  The patient was taken to the operating room on the day of admission for management of painful pseudoarthrosis and spondylotic changes at L2-3, the level above is previous fusion.  L2-L4 fusion was accomplished at that time using BMP and local graft.  Postoperatively, there were no serious complications, although he had acute severe itching, the source of itch which we never really discovered.  However, he essentially went off all medications except his usual medication he takes for his bipolar disorder plus Neurontin.  The itching subsided.  The drains removed towards the end of the second postoperative day.  The incision was dry and has a fresh dressing on it.  He was prescribed a gabapentin for discharge as well as his usual medicines.  He has an appointment for return to the office in 4 weeks' time.  He will avoid lifting and bending.  He may shower at any time.  He may discontinue the dressing at any time.  CONDITION ON DISCHARGE:  Stable, ambulatory, satisfactory pain control.     Nelda Severe, MD     MT/MEDQ  D:  10/24/2011  T:  10/24/2011  Job:  [id number]

## 2011-10-24 NOTE — Discharge Summary (Signed)
Dictation on: 10/24/2011  1:42 PM by: Charlsie Quest [[id number]]    Final Dx: L3-4 pseudoarthrosis, Lumbar spondylosis  Dictation # [id number]

## 2011-10-24 NOTE — Plan of Care (Signed)
Problem: Phase II Progression Outcomes Goal: Verbalizes of donning/doffing brace Outcome: Not Applicable Date Met:  10/24/11 Not applicable, no brace ordered

## 2011-10-24 NOTE — Progress Notes (Signed)
Vital signs stable, afebrile      C/O:  No real complaints, wants to stay till Monday 26 Nov.  He has had a BM and pain control is satisfactory   O/E: comfortable in bed    Assessment:Stable.  I have explained to him that there is no medical basis for prolonging his hospital stay     Plan:Discharge, followin office in approx 4 weeks.    Allena Pietila,S Lavante Toso

## 2011-10-26 MED FILL — Heparin Sodium (Porcine) Inj 1000 Unit/ML: INTRAMUSCULAR | Qty: 30 | Status: AC

## 2011-10-26 MED FILL — Sodium Chloride Irrigation Soln 0.9%: Qty: 3000 | Status: AC

## 2011-10-26 MED FILL — Sodium Chloride IV Soln 0.9%: INTRAVENOUS | Qty: 1000 | Status: AC

## 2011-11-25 NOTE — H&P (Signed)
[name] PATIENT: [name] DATE OF BIRTH: 10-10-1956 DATE: 11/20/2011 9:45 AM  VISIT TYPE: Post Op Visit DATE OF INJURY:    Patient was seen in the PA clinic today.   History of Present Illness: This 55 year old  male presents with: 1.  lumbar spine   The patient comes into the office for Post Op visit. PT had surgery 11.20.2012.  The problem is improving.  His pain occurs intermittently.  Location of pain is lower back.  Patient rates pain as 3 out of 10.  There is no radiation of pain.  The patient describes the pain as dull.  Symptoms are aggravated by standing.  Symptoms are relieved by walking. The patient takes Norco for pain. The patient states he needs refills for pain reliever and muscle relaxers. Past Medical History: Reviewed, no change. Last documented: 10/16/2011.   Allergies: Reviewed, no changes. Allergen/Ingredient Brand Reaction  PENICILLINS    HYDROCODONE  Itching   Family History: Reviewed, no changes.  Last detailed document: 10/16/2011.  Social History  Reviewed, no changes.  Last detailed document date: 10/16/2011.   Review of Systems System Negative Positive    Constitutional Fatigue, fever and night sweats.       HEENT Vision loss.       Respiratory Cough and dyspnea.       Cardiovascular Chest pain, cyanosis and irregular heartbeat/palpitations.       Gastrointestinal Constipation, diarrhea and vomiting.       Genitourinary Dysuria and hematuria.       Metabolic / Endocrine Cold intolerance and heat intolerance.       Neuro / Psychiatric Difficulty walking, dizziness and headache.  Anxiety and depression.       Dermatologic Rash.       Musculoskeletal Except as noted in hpi and chief complaint.       Hematology Bruising and easy bleeding.       Immunology Environmental allergies and food allergies.       Vital Signs:  Time BP mm/Hg Pulse/min Resp/min Temp F Ht ft Ht in Wt lb BMI kg/m2 BSA m2  10:01 AM 108/71 102   6.0 1.00  202.00 26.65    Measured By: Time   10:01 AM Richardo Hanks   Surgery is scheduled with  at [facility]. The surgeon will be assisted by . This surgery is inpatient.  Diagnostics: Study Result/Report  X-RAY L-SPINE 2 VIEW   standing AP/lat Set screws from upper rod/screw couplings have loosened and disengaged.   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  this will need to be fixed, to ameliiorate the risk of pseudoarthrosis.  I have explained this to him.  We will try to get this don e next week. SMTTMD  Orders Diagnostic Procedures: Assessment Procedure Proc Code  724.5 X-RAY L-SPINE 2 VIEW   standing AP/lat 40981     Medications prescribed or renewed this visit: Brand Dose Sig Desc  NORCO 5 mg-325 mg take 1 tablet by ORAL route  every 8 hours as needed for pain    Provider:  Mosetta Pigeon PA-C Encounter submitted for review by Mosetta Pigeon PA-C on 11/25/2011 4:59 PM. .  Document generated by:  Willa Rough. [place], PA-C  11/25/2011 [address]    [place], [place] [address]  Phone: [phone]     Fax: [phone]

## 2011-11-26 ENCOUNTER — Inpatient Hospital Stay (HOSPITAL_COMMUNITY): Payer: Medicare Other | Admitting: Anesthesiology

## 2011-11-26 ENCOUNTER — Encounter (HOSPITAL_COMMUNITY): Payer: Self-pay | Admitting: Anesthesiology

## 2011-11-26 ENCOUNTER — Inpatient Hospital Stay (HOSPITAL_COMMUNITY)
Admission: RE | Admit: 2011-11-26 | Discharge: 2011-11-30 | DRG: 909 | Disposition: A | Discharge status: Home / Self Care | Payer: Medicare Other | Source: Ambulatory Visit | Attending: Orthopedic Surgery | Admitting: Orthopedic Surgery

## 2011-11-26 ENCOUNTER — Inpatient Hospital Stay (HOSPITAL_COMMUNITY): Payer: Medicare Other

## 2011-11-26 ENCOUNTER — Encounter (HOSPITAL_COMMUNITY): Payer: Self-pay | Admitting: *Deleted

## 2011-11-26 ENCOUNTER — Encounter (HOSPITAL_COMMUNITY)
Admission: RE | Disposition: A | Discharge status: Home / Self Care | Payer: Self-pay | Source: Ambulatory Visit | Attending: Orthopedic Surgery

## 2011-11-26 DIAGNOSIS — Z981 Arthrodesis status: Secondary | ICD-10-CM

## 2011-11-26 DIAGNOSIS — Z880 Allergy status to penicillin: Secondary | ICD-10-CM

## 2011-11-26 DIAGNOSIS — Z79899 Other long term (current) drug therapy: Secondary | ICD-10-CM

## 2011-11-26 DIAGNOSIS — L02419 Cutaneous abscess of limb, unspecified: Secondary | ICD-10-CM

## 2011-11-26 DIAGNOSIS — G8929 Other chronic pain: Secondary | ICD-10-CM | POA: Diagnosis present

## 2011-11-26 DIAGNOSIS — L03119 Cellulitis of unspecified part of limb: Secondary | ICD-10-CM

## 2011-11-26 DIAGNOSIS — Y831 Surgical operation with implant of artificial internal device as the cause of abnormal reaction of the patient, or of later complication, without mention of misadventure at the time of the procedure: Secondary | ICD-10-CM | POA: Diagnosis present

## 2011-11-26 DIAGNOSIS — Z860100 Personal history of colon polyps, unspecified: Secondary | ICD-10-CM

## 2011-11-26 DIAGNOSIS — T85698A Other mechanical complication of other specified internal prosthetic devices, implants and grafts, initial encounter: Principal | ICD-10-CM | POA: Diagnosis present

## 2011-11-26 DIAGNOSIS — F319 Bipolar disorder, unspecified: Secondary | ICD-10-CM | POA: Diagnosis present

## 2011-11-26 DIAGNOSIS — Y92009 Unspecified place in unspecified non-institutional (private) residence as the place of occurrence of the external cause: Secondary | ICD-10-CM

## 2011-11-26 LAB — COMPREHENSIVE METABOLIC PANEL
Albumin: 3.6 g/dL (ref 3.5–5.2)
Alkaline Phosphatase: 120 U/L — ABNORMAL HIGH (ref 39–117)
BUN: 7 mg/dL (ref 6–23)
Calcium: 9.7 mg/dL (ref 8.4–10.5)
Creatinine, Ser: 0.73 mg/dL (ref 0.50–1.35)
GFR calc Af Amer: >90 mL/min (ref >90)
Glucose, Bld: 182 mg/dL — ABNORMAL HIGH (ref 70–99)
Potassium: 4.2 mEq/L (ref 3.5–5.1)
Total Protein: 7.8 g/dL (ref 6.0–8.3)

## 2011-11-26 LAB — DIFFERENTIAL
Basophils Relative: 1 % (ref 0–1)
Eosinophils Absolute: 0.3 10*3/uL (ref 0.0–0.7)
Eosinophils Relative: 5 % (ref 0–5)
Lymphs Abs: 2.4 10*3/uL (ref 0.7–4.0)
Monocytes Absolute: 0.7 10*3/uL (ref 0.1–1.0)
Monocytes Relative: 11 % (ref 3–12)
Neutrophils Relative %: 44 % (ref 43–77)

## 2011-11-26 LAB — URINALYSIS, ROUTINE W REFLEX MICROSCOPIC
Bilirubin Urine: NEGATIVE
Hgb urine dipstick: NEGATIVE
Nitrite: NEGATIVE
Protein, ur: NEGATIVE mg/dL
Specific Gravity, Urine: 1.013 (ref 1.005–1.030)
Urobilinogen, UA: 1 mg/dL (ref 0.0–1.0)

## 2011-11-26 LAB — CBC
HCT: 38.2 % — ABNORMAL LOW (ref 39.0–52.0)
HCT: 33.5 % — ABNORMAL LOW (ref 39.0–52.0)
Hemoglobin: 13.1 g/dL (ref 13.0–17.0)
Hemoglobin: 11.5 g/dL — ABNORMAL LOW (ref 13.0–17.0)
MCH: 31.1 pg (ref 26.0–34.0)
MCHC: 34.3 g/dL (ref 30.0–36.0)
MCV: 90.7 fL (ref 78.0–100.0)
RBC: 4.21 MIL/uL — ABNORMAL LOW (ref 4.22–5.81)
RBC: 3.7 MIL/uL — ABNORMAL LOW (ref 4.22–5.81)
RDW: 12 % (ref 11.5–15.5)
WBC: 11.5 10*3/uL — ABNORMAL HIGH (ref 4.0–10.5)

## 2011-11-26 LAB — TYPE AND SCREEN: ABO/RH(D): B POS

## 2011-11-26 LAB — SURGICAL PCR SCREEN: Staphylococcus aureus: NEGATIVE

## 2011-11-26 LAB — PROTIME-INR
INR: 1.03 (ref 0.00–1.49)
Prothrombin Time: 13.7 seconds (ref 11.6–15.2)

## 2011-11-26 SURGERY — POSTERIOR LUMBAR FUSION 1 WITH HARDWARE REMOVAL
Anesthesia: General | Site: Back | Wound class: Clean

## 2011-11-26 MED ORDER — PHENYLEPHRINE HCL 10 MG/ML IJ SOLN
10.0000 mg | INTRAVENOUS | Status: DC | PRN
  Administered 2011-11-26: 40 ug/min via INTRAVENOUS

## 2011-11-26 MED ORDER — VANCOMYCIN HCL POWD
Status: DC | PRN
  Administered 2011-11-26: 1000 mg

## 2011-11-26 MED ORDER — BUPIVACAINE HCL (PF) 0.25 % IJ SOLN
INTRAMUSCULAR | Status: DC | PRN
  Administered 2011-11-26: 12 mL

## 2011-11-26 MED ORDER — ONDANSETRON HCL 4 MG PO TABS: 4.0000 mg | ORAL_TABLET | Freq: Four times a day (QID) | ORAL | Status: DC | PRN

## 2011-11-26 MED ORDER — SODIUM CHLORIDE 0.9 % IV SOLN
INTRAVENOUS | Status: DC | PRN
  Administered 2011-11-26: 13:00:00 via INTRAVENOUS

## 2011-11-26 MED ORDER — LACTATED RINGERS IV SOLN
INTRAVENOUS | Status: DC
  Administered 2011-11-26: 12:00:00 via INTRAVENOUS

## 2011-11-26 MED ORDER — FENTANYL CITRATE 0.05 MG/ML IJ SOLN
INTRAMUSCULAR | Status: DC | PRN
  Administered 2011-11-26: 50 ug via INTRAVENOUS
  Administered 2011-11-26 (×2): 100 ug via INTRAVENOUS

## 2011-11-26 MED ORDER — GABAPENTIN 600 MG PO TABS
600.0000 mg | ORAL_TABLET | Freq: Three times a day (TID) | ORAL | Status: DC
  Administered 2011-11-26 – 2011-11-30 (×12): 600 mg via ORAL
  Filled 2011-11-26 (×14): qty 1

## 2011-11-26 MED ORDER — GABAPENTIN 600 MG PO TABS
600.0000 mg | ORAL_TABLET | ORAL | Status: AC
  Filled 2011-11-26: qty 1

## 2011-11-26 MED ORDER — MUPIROCIN 2 % EX OINT
TOPICAL_OINTMENT | CUTANEOUS | Status: AC
  Filled 2011-11-26: qty 22

## 2011-11-26 MED ORDER — SURGIFOAM 100 EX MISC
CUTANEOUS | Status: DC | PRN
  Administered 2011-11-26: 14:00:00 via TOPICAL

## 2011-11-26 MED ORDER — LACTATED RINGERS IV SOLN
INTRAVENOUS | Status: DC | PRN
  Administered 2011-11-26 (×3): via INTRAVENOUS

## 2011-11-26 MED ORDER — METHOCARBAMOL 100 MG/ML IJ SOLN
500.0000 mg | Freq: Four times a day (QID) | INTRAVENOUS | Status: DC | PRN
  Administered 2011-11-26: 500 mg via INTRAVENOUS
  Filled 2011-11-26: qty 5

## 2011-11-26 MED ORDER — NEOSTIGMINE METHYLSULFATE 1 MG/ML IJ SOLN
INTRAMUSCULAR | Status: DC | PRN
  Administered 2011-11-26: 4 mg via INTRAVENOUS

## 2011-11-26 MED ORDER — ONDANSETRON HCL 4 MG/2ML IJ SOLN: 4.0000 mg | Freq: Four times a day (QID) | INTRAMUSCULAR | Status: DC | PRN

## 2011-11-26 MED ORDER — OXYCODONE HCL 5 MG PO TABS
5.0000 mg | ORAL_TABLET | ORAL | Status: DC | PRN
  Administered 2011-11-26 – 2011-11-27 (×2): 10 mg via ORAL
  Administered 2011-11-28 – 2011-11-30 (×8): 5 mg via ORAL
  Filled 2011-11-26 (×2): qty 1
  Filled 2011-11-26: qty 2
  Filled 2011-11-26 (×5): qty 1
  Filled 2011-11-26: qty 2
  Filled 2011-11-26: qty 1

## 2011-11-26 MED ORDER — TRAZODONE HCL 150 MG PO TABS
300.0000 mg | ORAL_TABLET | Freq: Every day | ORAL | Status: DC
  Administered 2011-11-26 – 2011-11-29 (×4): 450 mg via ORAL
  Filled 2011-11-26 (×5): qty 3

## 2011-11-26 MED ORDER — VANCOMYCIN HCL IN DEXTROSE 1-5 GM/200ML-% IV SOLN
1000.0000 mg | Freq: Two times a day (BID) | INTRAVENOUS | Status: AC
  Administered 2011-11-26: 1000 mg via INTRAVENOUS
  Filled 2011-11-26: qty 200

## 2011-11-26 MED ORDER — ONDANSETRON HCL 4 MG/2ML IJ SOLN
INTRAMUSCULAR | Status: DC | PRN
  Administered 2011-11-26: 4 mg via INTRAVENOUS

## 2011-11-26 MED ORDER — DIPHENHYDRAMINE HCL 12.5 MG/5ML PO ELIX
12.5000 mg | ORAL_SOLUTION | ORAL | Status: DC | PRN
  Filled 2011-11-26: qty 10

## 2011-11-26 MED ORDER — SODIUM CHLORIDE 0.9 % IR SOLN
Status: DC | PRN
  Administered 2011-11-26: 14:00:00

## 2011-11-26 MED ORDER — HYDROMORPHONE HCL PF 1 MG/ML IJ SOLN: 0.2500 mg | INTRAMUSCULAR | Status: DC | PRN

## 2011-11-26 MED ORDER — METHOCARBAMOL 500 MG PO TABS
500.0000 mg | ORAL_TABLET | Freq: Four times a day (QID) | ORAL | Status: DC | PRN
  Administered 2011-11-26 – 2011-11-29 (×5): 500 mg via ORAL
  Filled 2011-11-26 (×6): qty 1

## 2011-11-26 MED ORDER — LORAZEPAM 2 MG/ML IJ SOLN: 1.0000 mg | Freq: Once | INTRAMUSCULAR | Status: DC | PRN

## 2011-11-26 MED ORDER — ROCURONIUM BROMIDE 100 MG/10ML IV SOLN
INTRAVENOUS | Status: DC | PRN
  Administered 2011-11-26: 50 mg via INTRAVENOUS

## 2011-11-26 MED ORDER — BUPIVACAINE LIPOSOME 1.3 % IJ SUSP
INTRAMUSCULAR | Status: DC | PRN
  Administered 2011-11-26: 20 mL

## 2011-11-26 MED ORDER — METOCLOPRAMIDE HCL 10 MG PO TABS: 5.0000 mg | ORAL_TABLET | Freq: Three times a day (TID) | ORAL | Status: DC | PRN

## 2011-11-26 MED ORDER — QUETIAPINE FUMARATE 400 MG PO TABS
400.0000 mg | ORAL_TABLET | Freq: Every day | ORAL | Status: DC
  Administered 2011-11-26: 600 mg via ORAL
  Administered 2011-11-27: 400 mg via ORAL
  Administered 2011-11-28 – 2011-11-29 (×2): 600 mg via ORAL
  Filled 2011-11-26 (×6): qty 2

## 2011-11-26 MED ORDER — FENTANYL CITRATE 0.05 MG/ML IJ SOLN
INTRAMUSCULAR | Status: AC
  Filled 2011-11-26: qty 2

## 2011-11-26 MED ORDER — PROPOFOL 10 MG/ML IV EMUL
INTRAVENOUS | Status: DC | PRN
  Administered 2011-11-26: 200 mg via INTRAVENOUS
  Administered 2011-11-26: 50 mg via INTRAVENOUS

## 2011-11-26 MED ORDER — FENTANYL CITRATE 0.05 MG/ML IJ SOLN: 50.0000 ug | INTRAMUSCULAR | Status: DC | PRN

## 2011-11-26 MED ORDER — GLYCOPYRROLATE 0.2 MG/ML IJ SOLN
INTRAMUSCULAR | Status: DC | PRN
  Administered 2011-11-26: .6 mg via INTRAVENOUS

## 2011-11-26 MED ORDER — FENTANYL CITRATE 0.05 MG/ML IJ SOLN
25.0000 ug | INTRAMUSCULAR | Status: DC | PRN
  Administered 2011-11-26 (×3): 50 ug via INTRAVENOUS

## 2011-11-26 MED ORDER — MIDAZOLAM HCL 2 MG/2ML IJ SOLN: 1.0000 mg | INTRAMUSCULAR | Status: DC | PRN

## 2011-11-26 MED ORDER — HETASTARCH-ELECTROLYTES 6 % IV SOLN
INTRAVENOUS | Status: DC | PRN
  Administered 2011-11-26: 15:00:00 via INTRAVENOUS

## 2011-11-26 MED ORDER — MIDAZOLAM HCL 5 MG/5ML IJ SOLN
INTRAMUSCULAR | Status: DC | PRN
  Administered 2011-11-26 (×2): 1 mg via INTRAVENOUS

## 2011-11-26 MED ORDER — VANCOMYCIN HCL 1000 MG IV SOLR
1000.0000 mg | INTRAVENOUS | Status: AC
  Filled 2011-11-26: qty 1000

## 2011-11-26 MED ORDER — PROMETHAZINE HCL 25 MG/ML IJ SOLN: 6.2500 mg | INTRAMUSCULAR | Status: DC | PRN

## 2011-11-26 MED ORDER — LIDOCAINE-EPINEPHRINE (PF) 1 %-1:200000 IJ SOLN
INTRAMUSCULAR | Status: DC | PRN
  Administered 2011-11-26: 12 mL

## 2011-11-26 MED ORDER — BUPIVACAINE LIPOSOME 1.3 % IJ SUSP
20.0000 mL | Freq: Once | INTRAMUSCULAR | Status: DC
  Filled 2011-11-26: qty 20

## 2011-11-26 MED ORDER — SODIUM CHLORIDE 0.9 % IV SOLN: INTRAVENOUS | Status: DC

## 2011-11-26 MED ORDER — LACTATED RINGERS IV SOLN
INTRAVENOUS | Status: DC | PRN
  Administered 2011-11-26: 15:00:00 via INTRAVENOUS

## 2011-11-26 MED ORDER — METOCLOPRAMIDE HCL 5 MG/ML IJ SOLN
5.0000 mg | Freq: Three times a day (TID) | INTRAMUSCULAR | Status: DC | PRN
  Filled 2011-11-26: qty 2

## 2011-11-26 MED ORDER — VANCOMYCIN HCL IN DEXTROSE 1-5 GM/200ML-% IV SOLN
1000.0000 mg | INTRAVENOUS | Status: AC
  Administered 2011-11-26: 1000 mg via INTRAVENOUS
  Filled 2011-11-26: qty 200

## 2011-11-26 SURGICAL SUPPLY — 83 items
ADH SKN CLS APL DERMABOND .7 (GAUZE/BANDAGES/DRESSINGS) ×1
BAG DECANTER FOR FLEXI CONT (MISCELLANEOUS) ×2 IMPLANT
BLADE SURG 15 STRL LF DISP TIS (BLADE) IMPLANT
BLADE SURG 15 STRL SS (BLADE)
BLADE SURG ROTATE 9660 (MISCELLANEOUS) IMPLANT
BUR MATCHSTICK NEURO 3.0 LAGG (BURR) ×2 IMPLANT
CARTRIDGE OIL MAESTRO DRILL (MISCELLANEOUS) ×1 IMPLANT
CLOTH BEACON ORANGE TIMEOUT ST (SAFETY) ×2 IMPLANT
CONT SPECI 4OZ STER CLIK (MISCELLANEOUS) ×1 IMPLANT
CORDS BIPOLAR (ELECTRODE) ×2 IMPLANT
COVER SURGICAL LIGHT HANDLE (MISCELLANEOUS) ×1 IMPLANT
DECANTER SPIKE VIAL GLASS SM (MISCELLANEOUS) ×4 IMPLANT
DERMABOND ADVANCED (GAUZE/BANDAGES/DRESSINGS) ×1
DERMABOND ADVANCED .7 DNX12 (GAUZE/BANDAGES/DRESSINGS) ×1 IMPLANT
DIFFUSER DRILL AIR PNEUMATIC (MISCELLANEOUS) ×2 IMPLANT
DRAIN CHANNEL 15F RND FF W/TCR (WOUND CARE) ×1 IMPLANT
DRAIN TLS ROUND 10FR (DRAIN) IMPLANT
DRAPE PROXIMA HALF (DRAPES) ×4 IMPLANT
DRAPE SURG 17X23 STRL (DRAPES) ×7 IMPLANT
DRAPE TABLE COVER HEAVY DUTY (DRAPES) ×1 IMPLANT
DRSG MEPILEX BORDER 4X4 (GAUZE/BANDAGES/DRESSINGS) ×2 IMPLANT
DRSG MEPILEX BORDER 4X8 (GAUZE/BANDAGES/DRESSINGS) ×2 IMPLANT
DRSG OPSITE 11X17.75 LRG (GAUZE/BANDAGES/DRESSINGS) ×2 IMPLANT
DURAPREP 26ML APPLICATOR (WOUND CARE) ×2 IMPLANT
ELECT BLADE 4.0 EZ CLEAN MEGAD (MISCELLANEOUS) ×2
ELECT CAUTERY BLADE 6.4 (BLADE) ×2 IMPLANT
ELECT REM PT RETURN 9FT ADLT (ELECTROSURGICAL) ×2
ELECTRODE BLDE 4.0 EZ CLN MEGD (MISCELLANEOUS) ×1 IMPLANT
ELECTRODE REM PT RTRN 9FT ADLT (ELECTROSURGICAL) ×1 IMPLANT
EVACUATOR 1/8 PVC DRAIN (DRAIN) ×1 IMPLANT
EVACUATOR SILICONE 100CC (DRAIN) ×1 IMPLANT
GAUZE SPONGE 4X4 16PLY XRAY LF (GAUZE/BANDAGES/DRESSINGS) ×6 IMPLANT
GLOVE BIOGEL PI IND STRL 7.5 (GLOVE) ×1 IMPLANT
GLOVE BIOGEL PI IND STRL 9 (GLOVE) ×1 IMPLANT
GLOVE BIOGEL PI INDICATOR 7.5 (GLOVE) ×1
GLOVE BIOGEL PI INDICATOR 9 (GLOVE) ×1
GLOVE SS BIOGEL STRL SZ 7 (GLOVE) ×1 IMPLANT
GLOVE SS BIOGEL STRL SZ 8.5 (GLOVE) ×1 IMPLANT
GLOVE SUPERSENSE BIOGEL SZ 7 (GLOVE) ×1
GLOVE SUPERSENSE BIOGEL SZ 8.5 (GLOVE) ×1
GOWN PREVENTION PLUS XLARGE (GOWN DISPOSABLE) ×2 IMPLANT
GOWN STRL NON-REIN LRG LVL3 (GOWN DISPOSABLE) ×4 IMPLANT
KIT BASIN OR (CUSTOM PROCEDURE TRAY) ×2 IMPLANT
KIT POSITION SURG JACKSON T1 (MISCELLANEOUS) ×2 IMPLANT
KIT ROOM TURNOVER OR (KITS) ×2 IMPLANT
MANIFOLD NEPTUNE II (INSTRUMENTS) ×2 IMPLANT
NDL HYPO 25X1 1.5 SAFETY (NEEDLE) ×1 IMPLANT
NDL SPNL 18GX3.5 QUINCKE PK (NEEDLE) ×1 IMPLANT
NDL SPNL 22GX3.5 QUINCKE BK (NEEDLE) ×1 IMPLANT
NEEDLE HYPO 25X1 1.5 SAFETY (NEEDLE) ×2 IMPLANT
NEEDLE SPNL 18GX3.5 QUINCKE PK (NEEDLE) ×4 IMPLANT
NEEDLE SPNL 22GX3.5 QUINCKE BK (NEEDLE) ×2 IMPLANT
NS IRRIG 1000ML POUR BTL (IV SOLUTION) ×2 IMPLANT
OIL CARTRIDGE MAESTRO DRILL (MISCELLANEOUS) ×2
PACK LAMINECTOMY ORTHO (CUSTOM PROCEDURE TRAY) ×2 IMPLANT
PACK UNIVERSAL I (CUSTOM PROCEDURE TRAY) ×2 IMPLANT
PAD ARMBOARD 7.5X6 YLW CONV (MISCELLANEOUS) ×4 IMPLANT
PATTIES SURGICAL .5 X1 (DISPOSABLE) ×2 IMPLANT
PATTIES SURGICAL .75X.75 (GAUZE/BANDAGES/DRESSINGS) IMPLANT
PATTIES SURGICAL 1X1 (DISPOSABLE) ×4 IMPLANT
SPONGE GAUZE 4X4 12PLY (GAUZE/BANDAGES/DRESSINGS) ×1 IMPLANT
SPONGE NEURO XRAY DETECT 1X3 (DISPOSABLE) IMPLANT
SPONGE SURGIFOAM ABS GEL 100 (HEMOSTASIS) IMPLANT
SURGIFLO TRUKIT (HEMOSTASIS) ×4 IMPLANT
SUT ETHILON 2 0 FS 18 (SUTURE) ×5 IMPLANT
SUT VIC AB 1 CT1 18XCR BRD 8 (SUTURE) ×2 IMPLANT
SUT VIC AB 1 CT1 8-18 (SUTURE) ×4
SUT VIC AB 1 CTX 18 (SUTURE) ×2 IMPLANT
SUT VIC AB 1 CTX 36 (SUTURE)
SUT VIC AB 1 CTX36XBRD ANBCTR (SUTURE) ×3 IMPLANT
SUT VIC AB 2-0 CT1 18 (SUTURE) ×2 IMPLANT
SUT VIC AB 2-0 CT1 27 (SUTURE) ×4
SUT VIC AB 2-0 CT1 TAPERPNT 27 (SUTURE) ×2 IMPLANT
SUT VIC AB 3-0 X1 27 (SUTURE) ×4 IMPLANT
SYR BULB IRRIGATION 50ML (SYRINGE) ×2 IMPLANT
SYR CONTROL 10ML LL (SYRINGE) ×2 IMPLANT
SYRINGE 10CC LL (SYRINGE) ×2 IMPLANT
SYSTEM CHEST DRAIN TLS 7FR (DRAIN) IMPLANT
TOWEL OR 17X24 6PK STRL BLUE (TOWEL DISPOSABLE) ×2 IMPLANT
TOWEL OR 17X26 10 PK STRL BLUE (TOWEL DISPOSABLE) ×2 IMPLANT
TRAY FOLEY CATH 14FR (SET/KITS/TRAYS/PACK) ×2 IMPLANT
TUBE SUCT ARGYLE STRL (TUBING) ×2 IMPLANT
WATER STERILE IRR 1000ML POUR (IV SOLUTION) ×8 IMPLANT

## 2011-11-26 NOTE — Preoperative (Signed)
Beta Blockers   Reason not to administer Beta Blockers:Not Applicable 

## 2011-11-26 NOTE — Anesthesia Preprocedure Evaluation (Addendum)
Anesthesia Evaluation  Patient identified by MRN, date of birth, ID band Patient awake    Reviewed: Allergy & Precautions, H&P , NPO status , Patient's Chart, lab work & pertinent test results, reviewed documented beta blocker date and time   Airway Mallampati: II TM Distance: >3 FB Neck ROM: Full    Dental  (+) Partial Upper and Partial Lower   Pulmonary    Pulmonary exam normal       Cardiovascular     Neuro/Psych    GI/Hepatic   Endo/Other    Renal/GU      Musculoskeletal   Abdominal   Peds  Hematology   Anesthesia Other Findings Full beard  Reproductive/Obstetrics                         Anesthesia Physical Anesthesia Plan  ASA: II  Anesthesia Plan: General   Post-op Pain Management:    Induction: Intravenous  Airway Management Planned: Oral ETT  Additional Equipment:   Intra-op Plan:   Post-operative Plan: Extubation in OR  Informed Consent: I have reviewed the patients History and Physical, chart, labs and discussed the procedure including the risks, benefits and alternatives for the proposed anesthesia with the patient or authorized representative who has indicated his/her understanding and acceptance.     Plan Discussed with: CRNA and Surgeon  Anesthesia Plan Comments:         Anesthesia Quick Evaluation

## 2011-11-26 NOTE — Transfer of Care (Signed)
Immediate Anesthesia Transfer of Care Note  Patient: [name]  Procedure(s) Performed:  POSTERIOR LUMBAR FUSION 1 WITH HARDWARE REMOVAL - REVISION OF SPINAL FIXATION OF THE LUMBAR SPINE   Patient Location: PACU  Anesthesia Type: General  Level of Consciousness: awake, alert  and patient cooperative  Airway & Oxygen Therapy: Patient Spontanous Breathing and Patient connected to nasal cannula oxygen  Post-op Assessment: Report given to PACU RN, Post -op Vital signs reviewed and stable and Patient moving all extremities  Post vital signs: Reviewed and stable  Complications: No apparent anesthesia complications

## 2011-11-26 NOTE — Brief Op Note (Signed)
11/26/2011  4:19 PM  PATIENT:  [name]  55 y.o. male  PRE-OPERATIVE DIAGNOSIS:  STATUS POST ARTHRODESIS; failed fixation   POST-OPERATIVE DIAGNOSIS:  Same  PROCEDURE:  POSTERIOR LUMBAR FUSION 1 WITH HARDWARE Revision  SURGEON:  Zaia Carre,S Tashanti Dalporto  PHYSICIAN ASSISTANT: E.M. Collins, PA-C  ANESTHESIA:   General  EXAM IN RECOVERY ROOM: Good lower extremity strength, awake/ alert

## 2011-11-26 NOTE — H&P (Signed)
Examined, no change from H&P.

## 2011-11-26 NOTE — Anesthesia Postprocedure Evaluation (Signed)
  Anesthesia Post-op Note  Patient: [name]  Procedure(s) Performed:  POSTERIOR LUMBAR FUSION 1 WITH HARDWARE REMOVAL - REVISION OF SPINAL FIXATION OF THE LUMBAR SPINE   Patient Location: PACU  Anesthesia Type: General  Level of Consciousness: awake, alert  and oriented  Airway and Oxygen Therapy: Patient Spontanous Breathing  Post-op Pain: mild  Post-op Assessment: Post-op Vital signs reviewed, Patient's Cardiovascular Status Stable, Respiratory Function Stable, Patent Airway, No signs of Nausea or vomiting and Pain level controlled  Post-op Vital Signs: stable  Complications: No apparent anesthesia complications

## 2011-11-27 LAB — BASIC METABOLIC PANEL
BUN: 6 mg/dL (ref 6–23)
CO2: 30 mEq/L (ref 19–32)
Chloride: 102 mEq/L (ref 96–112)
GFR calc Af Amer: >90 mL/min (ref >90)
Potassium: 3.5 mEq/L (ref 3.5–5.1)

## 2011-11-27 MED FILL — Mupirocin Oint 2%: CUTANEOUS | Qty: 22 | Status: AC

## 2011-11-27 NOTE — Op Note (Signed)
[name], [name] NO.:  [id number]  MEDICAL RECORD NO.:  [id number]  LOCATION:  5005                         FACILITY:  [facility]  PHYSICIAN:  Nelda Severe, MD      DATE OF BIRTH:  1956/07/15  DATE OF PROCEDURE:  11/26/2011 DATE OF DISCHARGE:                              OPERATIVE REPORT   SURGEON:  Nelda Severe, M.D.  ASSISTANT:  Lianne Cure, PA-C.  PREOPERATIVE DIAGNOSIS:  Failed lumbar spine fixation, status post lumbar fusion (loosening of proximal set screws at pedicle screw/rod junction).  POSTOPERATIVE DIAGNOSIS:  Failed lumbar spine fixation, status post lumbar fusion (loosening of proximal set screws at pedicle screw/rod junction).  OPERATIVE PROCEDURE:  Revision of pedicle screws and rods from L2 to L5 fusion (removed K2M Denali instrumentation, insert Everest fixation).  CLINICAL NOTE:  This patient was recently operated on and had repair of pseudoarthrosis at L3-4 and extension of fusion to L2.  On routine postoperative visit within the last few days, postoperative x-rays were taken in the office which revealed that the set screws at the upper two couplings, that is proximal junction of right and left-side at L2, had come loose and I felt that this should be revised to try to avoid a pseudoarthrosis in the future.  In view of the fact that the set screws had loosened having bean torqued, I decided to revise the entire system to different implant system with a different design and set screws.  OPERATIVE NOTE:  The patient was placed under general endotracheal anesthesia.  Sequential compression devices were placed on both lower extremities.  A Foley catheter was placed in the bladder.  He was given intravenous prophylactic antibiotics.  He was positioned prone on a Jackson frame.  Care was taken to position the upper extremities so as to avoid hyperflexion and abduction of the shoulders so as to avoid hyperflexion of the elbows.  The  upper extremities were padded with foam from axilla to hands.  Thighs, knees, shins, and ankles were supported on pillows. The thoracolumbar area was scrubbed, dried and then painted with DuraPrep.  The field was draped and the drapes secured with Ioban.  A time-out was held during which the usual parameters were discussed/confirmed.  The previous incision was re-incised throughout its length through the epidermis.  I then injected the subcutaneous tissue with a mixture of 0.25% plain Marcaine, 1% lidocaine with epinephrine.  Dissection was carried down to the thoracolumbar fascia, which was incised in line with the incision and spread apart.  There was a seroma in the submuscular layer.  The instrumentation was easily identifiable on both sides.  The 2 loose set screws were removed.  The rods were then decoupled and all the pedicle screws backed out.  At L2, the 6.5 mm diameter pedicle screws seemed to come out fairly easily, not grossly loose, but certainly not terribly tight.  Therefore I decided to size up to 7.5 mm for the re-insertion screws.  I did this after checking the CT scan cuts at L2 and the L2 pedicles were certainly more than large enough to accommodate a 7.5-mm diameter screws.  We placed a 7.5-mm diameter Everest screws  at each level and at L2, we tapped the preexisting pedicle holes to 7.5 mm.  A 45-mm length screws were used throughout, except at L4 where 50-mm screws were used.  Two appropriate length titanium rods were contoured and attached, and the set screws provisionally tightened.  A cross-table lateral radiograph was taken, showing satisfactory position of all implants.  Every set screw was then doubly torqued.  Care was taken to inspect the ends of both rods, proximally and distally to make sure there was a rod overhanging the coupling and there was.  We then washed the wound out with antibiotic solution, as had been done on multiple occasions during the  procedure.  We then placed a 15-gauge Blake drain subfascially and brought out through the skin to the right side where were secured with a 2-0 nylon suture.  The thoracolumbar fascia was then closed using multiple #1 Vicryl sutures in interrupted fashion.  The subcutaneous layer was closed using multiple 0 and #1 Vicryl sutures.  The skin tissues were extremely leathery and nonpliable.  The skin was then closed using multiple interrupted #2-0 nylon vertical mattress sutures.  Nonadherent dressing was applied and secured.  Blood loss estimated was less than 150 mL.  There were no intraoperative complications.  Sponge and needle counts were correct.  At the time of dictation,  the patient has not returned to the recovery room, so no examination is reported here.     Nelda Severe, MD     MT/MEDQ  D:  11/26/2011  T:  11/27/2011  Job:  [id number]

## 2011-11-27 NOTE — Progress Notes (Signed)
Physical Therapy Evaluation Patient Details Name: [name] MRN: [id number] DOB: 1956-08-09 Today's Date: 11/27/2011  Problem List:  Patient Active Problem List  Diagnoses  . CELLULITIS AND ABSCESS OF LEG EXCEPT FOOT    Past Medical History:  Past Medical History  Diagnosis Date  . Pneumonia     hx of-about 78yrs ago  . Chronic back pain   . History of colon polyps   . Mental disorder     bipolar   Past Surgical History:  Past Surgical History  Procedure Date  . Shoulder surgery 70's    right  . Appendectomy     early 1's  . Blood clot in groin early 80's  . Back surgery 05/2011    x 5  . Hernia repair     early 2000's  . Carpal tunnel release     left  . Cervical spine surgery 2009  . Colonoscopy     PT Assessment/Plan/Recommendation PT Assessment Clinical Impression Statement: Pt presents with a medical diagnosis of L2-4 fusion hardware revision along with the following impairments/deficits and therapy diagnosis listed below. Pt will benefit from skilled PT in the acute care setting in order to maximize functional mobility for a safe d/c home            . PT Recommendation/Assessment: Patient will need skilled PT in the acute care venue PT Problem List: Decreased activity tolerance;Decreased mobility;Decreased knowledge of use of DME;Decreased knowledge of precautions;Pain Barriers to Discharge: Decreased caregiver support PT Therapy Diagnosis : Difficulty walking;Acute pain PT Plan PT Frequency: Min 5X/week PT Treatment/Interventions: DME instruction;Gait training;Stair training;Functional mobility training;Therapeutic activities;Therapeutic exercise;Patient/family education PT Recommendation Follow Up Recommendations: Home health PT;24 hour supervision/assistance Equipment Recommended: Rolling walker with 5" wheels PT Goals  Acute Rehab PT Goals PT Goal Formulation: With patient Time For Goal Achievement: 7 days Pt will go Supine/Side to Sit: with  modified independence PT Goal: Supine/Side to Sit - Progress: Progressing toward goal Pt will go Sit to Supine/Side: with modified independence PT Goal: Sit to Supine/Side - Progress: Progressing toward goal Pt will go Sit to Stand: with modified independence PT Goal: Sit to Stand - Progress: Progressing toward goal Pt will go Stand to Sit: with modified independence PT Goal: Stand to Sit - Progress: Progressing toward goal Pt will Transfer Bed to Chair/Chair to Bed: with modified independence PT Transfer Goal: Bed to Chair/Chair to Bed - Progress: Progressing toward goal Pt will Ambulate: >150 feet;with modified independence;with least restrictive assistive device PT Goal: Ambulate - Progress: Progressing toward goal Pt will Go Up / Down Stairs: 6-9 stairs;with rail(s);with supervision PT Goal: Up/Down Stairs - Progress: Not met  PT Evaluation Precautions/Restrictions  Restrictions Weight Bearing Restrictions: No Prior Functioning  Home Living Lives With: Alone Receives Help From: Family Type of Home: Apartment Home Layout: One level Home Access: Stairs to enter Entrance Stairs-Rails: Can reach both Entrance Stairs-Number of Steps: 8 Bathroom Shower/Tub: Engineer, manufacturing systems: Standard Bathroom Accessibility: Yes How Accessible: Accessible via walker Home Adaptive Equipment: None Prior Function Level of Independence: Independent with gait;Independent with basic ADLs;Needs assistance with tranfers (nurse comes in and assists with getting up) Able to Take Stairs?: Yes Driving: No Vocation: On disability Cognition Cognition Arousal/Alertness: Lethargic Overall Cognitive Status: Appears within functional limits for tasks assessed Orientation Level: Oriented X4 Sensation/Coordination Sensation Light Touch: Appears Intact Extremity Assessment RLE Assessment RLE Assessment: Within Functional Limits LLE Assessment LLE Assessment: Within Functional Limits Mobility  (including Balance) Bed Mobility Bed Mobility: Yes  Rolling Right: 5: Supervision Rolling Right Details (indicate cue type and reason): VC for hand placement Right Sidelying to Sit: 3: Mod assist;HOB elevated (comment degrees);With rails (30) Right Sidelying to Sit Details (indicate cue type and reason): VC for hand placement and sequencing. Assist with trunk control into sitting. Sitting - Scoot to Edge of Bed: 4: Min assist;With rail Sitting - Scoot to [place] of Bed Details (indicate cue type and reason): VC for sequencing to maintain back precautions Transfers Transfers: Yes Sit to Stand: 4: Min assist;With upper extremity assist;From bed Sit to Stand Details (indicate cue type and reason): VC for hand placement and back precautions Stand to Sit: 4: Min assist;With upper extremity assist;To chair/3-in-1 Stand to Sit Details: VC for hand placement. Pt with controlled descent.  Ambulation/Gait Ambulation/Gait: Yes Ambulation/Gait Assistance: 4: Min assist Ambulation/Gait Assistance Details (indicate cue type and reason): VC for sequencing to maintain back precautions while turning and technique with RW Ambulation Distance (Feet): 30 Feet Assistive device: Rolling walker Gait Pattern: Step-to pattern;Decreased hip/knee flexion - right;Decreased hip/knee flexion - left Gait velocity: Decreased gait speed Stairs: No    Exercise  General Exercises - Lower Extremity Hip Flexion/Marching: Standing;10 reps;AROM;Strengthening;Both End of Session PT - End of Session Equipment Utilized During Treatment: Gait belt Activity Tolerance: Patient tolerated treatment well;Patient limited by pain (pt complained of uncomfortable hip during ambulation) Patient left: in chair;with call bell in reach Nurse Communication: Mobility status for transfers;Mobility status for ambulation General Behavior During Session: [facility] for tasks performed Cognition: [facility] for tasks performed  Milana Kidney 11/27/2011, 1:47 PM  11/27/2011 Milana Kidney DPT PAGER: [phone] OFFICE: [phone]

## 2011-11-27 NOTE — Progress Notes (Signed)
Inpatient Diabetes Program Recommendations  AACE/ADA: New Consensus Statement on Inpatient Glycemic Control (2009)  Target Ranges:  Prepandial:   less than 140 mg/dL      Peak postprandial:   less than 180 mg/dL (1-2 hours)      Critically ill patients:  140 - 180 mg/dL   Reason for Visit: Elevated glucose: 182, 195 mg/dL  Inpatient Diabetes Program Recommendations Correction (SSI): Add Novolog Correction HgbA1C: Check HgbA1C to assess glycemic control

## 2011-11-27 NOTE — Progress Notes (Signed)
Patient ID: [name], male   DOB: [birth year], 55 y.o.   MRN: [id number] Procedure(s) (LRB): POSTERIOR LUMBAR FUSION 1 WITH HARDWARE REMOVAL (N/A) 1 Day Post-Op   Subjective: Patient reports pain as moderate.    Objective:   VITALS:  @VITALSIGNS @  Neurologically intact ABD soft Dorsiflexion/Plantar flexion intact   LABS Lab Results  Component Value Date   HGB 11.5* 11/26/2011   HGB 13.1 11/26/2011   HGB 10.4* 10/23/2011   Lab Results  Component Value Date   WBC 11.5* 11/26/2011   Lab Results  Component Value Date   INR 1.03 11/26/2011   Lab Results  Component Value Date   NA 137 11/27/2011   K 3.5 11/27/2011   CL 102 11/27/2011   CO2 30 11/27/2011   BUN 6 11/27/2011   CREATININE 0.79 11/27/2011   GLUCOSE 195* 11/27/2011     Assessment/Plan: Active Problems:  * No active hospital problems. *    Advance diet   Shaunte Weissinger MAUREEN 11/27/2011, 7:23 PM

## 2011-11-27 NOTE — Progress Notes (Deleted)
Physical Therapy Evaluation Patient Details Name: [name] MRN: [id number] DOB: 01/22/1956 Today's Date: 11/27/2011  Problem List:  Patient Active Problem List  Diagnoses  . CELLULITIS AND ABSCESS OF LEG EXCEPT FOOT    Past Medical History:  Past Medical History  Diagnosis Date  . Pneumonia     hx of-about 91yrs ago  . Chronic back pain   . History of colon polyps   . Mental disorder     bipolar   Past Surgical History:  Past Surgical History  Procedure Date  . Shoulder surgery 70's    right  . Appendectomy     early 86's  . Blood clot in groin early 80's  . Back surgery 05/2011    x 5  . Hernia repair     early 2000's  . Carpal tunnel release     left  . Cervical spine surgery 2009  . Colonoscopy     PT Assessment/Plan/Recommendation   PT Goals     PT Evaluation Precautions/Restrictions    Prior Functioning      Cognition   Sensation/Coordination   Extremity Assessment   Mobility (including Balance)      Exercise    End of Session    Milana Kidney 11/27/2011, 1:46 PM

## 2011-11-28 LAB — BASIC METABOLIC PANEL
CO2: 28 mEq/L (ref 19–32)
Chloride: 103 mEq/L (ref 96–112)
Creatinine, Ser: 0.81 mg/dL (ref 0.50–1.35)
Sodium: 138 mEq/L (ref 135–145)

## 2011-11-28 MED ORDER — SENNA 8.6 MG PO TABS
1.0000 | ORAL_TABLET | Freq: Every day | ORAL | Status: DC
  Administered 2011-11-28: 8.6 mg via ORAL
  Filled 2011-11-28 (×3): qty 1

## 2011-11-28 MED ORDER — BISACODYL 10 MG RE SUPP
10.0000 mg | Freq: Every day | RECTAL | Status: DC | PRN
  Administered 2011-11-29: 10 mg via RECTAL
  Filled 2011-11-28: qty 1

## 2011-11-28 MED ORDER — DOCUSATE SODIUM 100 MG PO CAPS
100.0000 mg | ORAL_CAPSULE | Freq: Two times a day (BID) | ORAL | Status: DC
  Administered 2011-11-28 – 2011-11-30 (×4): 100 mg via ORAL
  Filled 2011-11-28 (×6): qty 1

## 2011-11-28 NOTE — Progress Notes (Signed)
Physical Therapy Treatment Patient Details Name: [name] MRN: [id number] DOB: Jul 30, 1956 Today's Date: 11/28/2011  PT Assessment/Plan  PT - Assessment/Plan Comments on Treatment Session: pt progressing well with ambulation today. attempt stairs next session PT Plan: Discharge plan remains appropriate PT Frequency: Min 5X/week Follow Up Recommendations: Home health PT;24 hour supervision/assistance Equipment Recommended: Rolling walker with 5" wheels PT Goals  Acute Rehab PT Goals PT Goal: Supine/Side to Sit - Progress: Met PT Goal: Sit to Stand - Progress: Progressing toward goal PT Goal: Stand to Sit - Progress: Progressing toward goal PT Transfer Goal: Bed to Chair/Chair to Bed - Progress: Progressing toward goal PT Goal: Ambulate - Progress: Progressing toward goal  PT Treatment Precautions/Restrictions  Precautions Precautions: Back Precaution Comments: Pt reeducated on 3/3 back precautions with patient Restrictions Weight Bearing Restrictions: No Mobility (including Balance) Bed Mobility Rolling Right: 6: Modified independent (Device/Increase time) Sitting - Scoot to Edge of Bed: 6: Modified independent (Device/Increase time) Transfers Sit to Stand: 5: Supervision;With armrests;From bed Sit to Stand Details (indicate cue type and reason): Cues for safe hand placement and technique Stand to Sit: 5: Supervision;With armrests;To chair/3-in-1 Stand to Sit Details: Cues for safe hand placement and technique Ambulation/Gait Ambulation/Gait Assistance: 4: Min assist (MinGuard A) Ambulation/Gait Assistance Details (indicate cue type and reason): Cues for safety of RW and cues for posture Ambulation Distance (Feet): 260 Feet Assistive device: Rolling walker Gait Pattern: Step-through pattern;Trunk flexed    Exercise    End of Session PT - End of Session Equipment Utilized During Treatment: Gait belt Activity Tolerance: Patient tolerated treatment well Patient left:  in chair General Behavior During Session: [facility] for tasks performed Cognition: [facility] for tasks performed  , Adline Potter 11/28/2011, 1:05 PM 11/28/2011 Fredrich Birks PTA [phone] pager [phone] office

## 2011-11-28 NOTE — Progress Notes (Signed)
  Patient Details:  [name] Nov 28, 1956 [id number]  VS's stable, Febrile - 38.3 C, Drains remain in situ, passing flatus, no BM yet , has apparently not been given Rx for constipation  O/E: appears comfortable,  L.E. Strength good bilaterally, Abdomen bloated, but not tender.    Ass't: satisfactory progress, constipated  Plan: Discontinue drains, Rx constipation.  [name],[name] 11/28/2011, 8:19 AM

## 2011-11-29 LAB — BASIC METABOLIC PANEL
Calcium: 8.8 mg/dL (ref 8.4–10.5)
GFR calc Af Amer: >90 mL/min (ref >90)
GFR calc non Af Amer: >90 mL/min (ref >90)
Sodium: 136 mEq/L (ref 135–145)

## 2011-11-29 NOTE — Progress Notes (Signed)
Patient ID: [name], male   DOB: 10-19-1956, 55 y.o.   MRN: [id number] Procedure(s) (LRB): POSTERIOR LUMBAR FUSION 1 WITH HARDWARE REMOVAL (N/A) 3 Days Post-Op   Subjective: Patient reports pain as moderate.  Still draining from incision after drain was d/c.  Objective:   VITALS:  @VITALSIGNS @  Neurovascular intact Sensation intact distally Dorsiflexion/Plantar flexion intact   LABS Lab Results  Component Value Date   HGB 11.5* 11/26/2011   HGB 13.1 11/26/2011   HGB 10.4* 10/23/2011   Lab Results  Component Value Date   WBC 11.5* 11/26/2011   Lab Results  Component Value Date   INR 1.03 11/26/2011   Lab Results  Component Value Date   NA 136 11/29/2011   K 3.4* 11/29/2011   CL 102 11/29/2011   CO2 28 11/29/2011   BUN 5* 11/29/2011   CREATININE 0.78 11/29/2011   GLUCOSE 225* 11/29/2011     Assessment/Plan: Active Problems:  * No active hospital problems. *  Wound drainage frank blood on dressing. Clean dry dressing applied today.  Advance diet Up with therapy D/C IV fluids Plan for discharge tomorrow as long as drainage is scant.   COLLINS,EMMA MAUREEN 11/29/2011, 11:43 AM

## 2011-11-30 MED ORDER — OXYCODONE HCL 5 MG PO TABS: 5.0000 mg | ORAL_TABLET | ORAL | Status: AC | PRN

## 2011-11-30 MED ORDER — METHOCARBAMOL 500 MG PO TABS: 500.0000 mg | ORAL_TABLET | Freq: Four times a day (QID) | ORAL | Status: AC | PRN

## 2011-11-30 NOTE — Progress Notes (Signed)
Case Manager ordered 3in1 thru [facility] for patient, Patient requested that it be delivered to his home, relayed this to the DME liasion.

## 2011-11-30 NOTE — Progress Notes (Signed)
Physical Therapy Treatment Patient Details Name: [name] MRN: [id number] DOB: [birth year] Today's Date: 11/30/2011  PT Assessment/Plan  PT - Assessment/Plan Comments on Treatment Session: Pt progressing well today. Still requiring some cues to follow precautions with mobility PT Plan: Discharge plan remains appropriate PT Frequency: Min 5X/week Follow Up Recommendations: Home health PT;24 hour supervision/assistance Equipment Recommended: Rolling walker with 5" wheels PT Goals  Acute Rehab PT Goals PT Goal: Sit to Stand - Progress: Met PT Goal: Stand to Sit - Progress: Met PT Transfer Goal: Bed to Chair/Chair to Bed - Progress: Met PT Goal: Up/Down Stairs - Progress: Met  PT Treatment Precautions/Restrictions  Precautions Precautions: Back Precaution Comments: Pt able to recall 2/3 back precautoins. Cues for no twisting Restrictions Weight Bearing Restrictions: No Mobility (including Balance) Transfers Sit to Stand: 7: Independent Stand to Sit: 7: Independent Ambulation/Gait Ambulation/Gait Assistance: 6: Modified independent (Device/Increase time) Ambulation Distance (Feet): 350 Feet Assistive device: None Gait Pattern: Within Functional Limits Gait velocity: slightly decreased Stairs: Yes Stairs Assistance: 5: Supervision Stairs Assistance Details (indicate cue type and reason): Cues for safety Stair Management Technique: No rails Number of Stairs: 6     Exercise    End of Session PT - End of Session Activity Tolerance: Patient tolerated treatment well Patient left: in bed;with call bell in reach General Behavior During Session: [facility] for tasks performed Cognition: [facility] for tasks performed  Fredrich Birks 11/30/2011, 12:15 PM  11/30/2011 Fredrich Birks PTA [phone] pager [phone] office

## 2011-11-30 NOTE — Discharge Summary (Signed)
Physician Discharge Summary  Patient ID: [name] MRN: [id number] DOB/AGE: 1956/07/14 55 y.o.  Admit date: 11/26/2011 Discharge date: 11/30/2011  Admission Diagnoses:  Active Problems:  * No active hospital problems. *  Hardware failure.  Discharge Diagnoses:  Same  Past Medical History  Diagnosis Date  . Pneumonia     hx of-about 38yrs ago  . Chronic back pain   . History of colon polyps   . Mental disorder     bipolar    Surgeries: Procedure(s): POSTERIOR LUMBAR FUSION 1 WITH HARDWARE REMOVAL on 11/26/2011   Discharged Condition: Improved  Hospital Course: BRITTEN SEYFRIED is an 55 y.o. male who was admitted 11/26/2011 with a diagnosis of <principal problem not specified> and went to the operating room on 11/26/2011 and underwent the above named procedures.    He were given perioperative antibiotics:  Anti-infectives     Start     Dose/Rate Route Frequency Ordered Stop   11/26/11 2100   vancomycin (VANCOCIN) IVPB 1000 mg/200 mL premix        1,000 mg 200 mL/hr over 60 Minutes Intravenous Every 12 hours 11/26/11 1748 11/26/11 2157   11/26/11 1423   Vancomycin HCl POWD  Status:  Discontinued          As needed 11/26/11 1424 11/26/11 1549   11/26/11 1422   polymyxin B 500,000 Units, bacitracin 50,000 Units in sodium chloride irrigation 0.9 % 500 mL irrigation  Status:  Discontinued          As needed 11/26/11 1423 11/26/11 1549   11/26/11 1400   vancomycin (VANCOCIN) powder 1,000 mg        1,000 mg Other To Surgery 11/26/11 1351 11/27/11 1400   11/26/11 1000   vancomycin (VANCOCIN) IVPB 1000 mg/200 mL premix        1,000 mg 200 mL/hr over 60 Minutes Intravenous 60 min pre-op 11/26/11 0954 11/26/11 1325        .  He  were given sequential compression devices, early ambulation, and mechanical prophylaxis for DVT prophylaxis.  They benefited maximally from their hospital stay and there were no complications.    Recent vital signs:  Filed Vitals:   11/30/11 0655  BP: 118/68  Pulse: 101  Temp: 98.6 F (37 C)  Resp: 18    Recent laboratory studies:  Lab Results  Component Value Date   HGB 11.5* 11/26/2011   HGB 13.1 11/26/2011   HGB 10.4* 10/23/2011   Lab Results  Component Value Date   WBC 11.5* 11/26/2011   PLT 169 11/26/2011   Lab Results  Component Value Date   INR 1.03 11/26/2011   Lab Results  Component Value Date   NA 136 11/29/2011   K 3.4* 11/29/2011   CL 102 11/29/2011   CO2 28 11/29/2011   BUN 5* 11/29/2011   CREATININE 0.78 11/29/2011   GLUCOSE 225* 11/29/2011    Discharge Medications:   Current Discharge Medication List    START taking these medications   Details  methocarbamol (ROBAXIN) 500 MG tablet Take 1 tablet (500 mg total) by mouth every 6 (six) hours as needed. Qty: 120 tablet, Refills: 0    oxyCODONE (OXY IR/ROXICODONE) 5 MG immediate release tablet Take 1 tablet (5 mg total) by mouth every 4 (four) hours as needed. Qty: 120 tablet, Refills: 0      CONTINUE these medications which have NOT CHANGED   Details  gabapentin (NEURONTIN) 600 MG tablet Take 600 mg by mouth 3 (three)  times daily.      QUEtiapine (SEROQUEL) 200 MG tablet Take 400-600 mg by mouth at bedtime.     traZODone (DESYREL) 150 MG tablet Take 300-450 mg by mouth at bedtime.          Diagnostic Studies: Dg Lumbar Spine 2-3 Views  11/26/2011  *RADIOLOGY REPORT*  Clinical Data: 55 year old male undergoing lumbar surgery.  LUMBAR SPINE - 2-3 VIEW  Comparison: 10/20/2011 and earlier.  Findings: Normal lumbar segmentation depicted on comparison.  Same numbering system is used.  2 intraoperative portable cross-table lateral views of the lumbar spine.  Film #1 at 1355 hours.  To posterior needles are in place.  The more cephalad needle is directed at the L2 vertebral body.  The more caudal needle is directed at the L5 vertebral body.  L2-L5 transpedicular hardware re-identified.  Film #2 1450 hours.  L2-L5 transpedicular  hardware still in place, the cortical screws have been revised. Interbody implant at L4-L5 appears unchanged.  IMPRESSION: L2 through L5 lumbar fusion hardware revision depicted.  Original Report Authenticated By: Harley Hallmark, M.D.    Disposition: Home or Self Care  Discharge Orders    Future Orders Please Complete By Expires   Diet - low sodium heart healthy      Call MD / Call 911      Comments:   If you experience chest pain or shortness of breath, CALL 911 and be transported to the hospital emergency room.  If you develope a fever above 101 F, pus (white drainage) or increased drainage or redness at the wound, or calf pain, call your surgeon's office.   Constipation Prevention      Comments:   Drink plenty of fluids.  Prune juice may be helpful.  You may use a stool softener, such as Colace (over the counter) 100 mg twice a day.  Use MiraLax (over the counter) for constipation as needed.   Increase activity slowly as tolerated      Weight Bearing as taught in Physical Therapy      Comments:   Use a walker or crutches as instructed.         SignedMosetta Pigeon 11/30/2011, 9:20 AM

## 2011-11-30 NOTE — Progress Notes (Signed)
D/C instructions reviewed with patient. RX x2 given. BSC to be delivered to the place he is staying for a few days. - He could not wait for it to be delivered to the room before discharge. All questions answered. Pt given daily meds prior to discharge. Pt d/c'ed via wheelchair in stable condition

## 2011-12-01 MED FILL — Vancomycin HCl For IV Soln 1 GM (Base Equivalent): INTRAVENOUS | Qty: 1000 | Status: AC

## 2012-03-25 ENCOUNTER — Other Ambulatory Visit: Payer: Self-pay | Admitting: Internal Medicine

## 2012-03-25 DIAGNOSIS — R945 Abnormal results of liver function studies: Secondary | ICD-10-CM

## 2012-03-29 ENCOUNTER — Inpatient Hospital Stay: Admission: RE | Admit: 2012-03-29 | Payer: Medicare Other | Source: Ambulatory Visit

## 2012-04-04 ENCOUNTER — Other Ambulatory Visit: Payer: Medicare Other

## 2012-04-06 ENCOUNTER — Other Ambulatory Visit: Payer: Medicare Other

## 2012-04-08 ENCOUNTER — Ambulatory Visit
Admission: RE | Admit: 2012-04-08 | Discharge: 2012-04-08 | Disposition: A | Discharge status: Home / Self Care | Payer: Medicare Other | Source: Ambulatory Visit | Attending: Internal Medicine | Admitting: Internal Medicine

## 2012-04-08 DIAGNOSIS — R945 Abnormal results of liver function studies: Secondary | ICD-10-CM

## 2012-07-14 ENCOUNTER — Ambulatory Visit: Payer: Medicare Other | Admitting: Gastroenterology

## 2012-08-31 ENCOUNTER — Other Ambulatory Visit: Payer: Self-pay | Admitting: Orthopedic Surgery

## 2012-08-31 DIAGNOSIS — M545 Low back pain: Secondary | ICD-10-CM

## 2012-09-02 ENCOUNTER — Ambulatory Visit
Admission: RE | Admit: 2012-09-02 | Discharge: 2012-09-02 | Disposition: A | Discharge status: Home / Self Care | Payer: Medicare Other | Source: Ambulatory Visit | Attending: Orthopedic Surgery | Admitting: Orthopedic Surgery

## 2012-09-02 DIAGNOSIS — M545 Low back pain: Secondary | ICD-10-CM

## 2013-11-13 ENCOUNTER — Encounter: Payer: Self-pay | Admitting: Anesthesiology

## 2013-11-14 ENCOUNTER — Other Ambulatory Visit: Payer: Self-pay | Admitting: Specialist

## 2013-11-14 DIAGNOSIS — M549 Dorsalgia, unspecified: Secondary | ICD-10-CM

## 2013-11-30 ENCOUNTER — Encounter: Payer: Self-pay | Admitting: Anesthesiology

## 2014-01-18 ENCOUNTER — Other Ambulatory Visit: Payer: Self-pay | Admitting: Internal Medicine

## 2014-01-18 DIAGNOSIS — C220 Liver cell carcinoma: Secondary | ICD-10-CM

## 2014-02-12 ENCOUNTER — Other Ambulatory Visit: Payer: Self-pay | Admitting: *Deleted

## 2014-02-12 DIAGNOSIS — L97909 Non-pressure chronic ulcer of unspecified part of unspecified lower leg with unspecified severity: Secondary | ICD-10-CM

## 2014-02-19 ENCOUNTER — Encounter: Payer: Self-pay | Admitting: Vascular Surgery

## 2014-02-19 ENCOUNTER — Encounter (HOSPITAL_COMMUNITY): Payer: Medicare Other

## 2014-02-19 ENCOUNTER — Encounter: Payer: Medicare Other | Admitting: Surgery

## 2014-02-20 ENCOUNTER — Encounter: Payer: Self-pay | Admitting: Vascular Surgery

## 2014-02-20 ENCOUNTER — Ambulatory Visit (INDEPENDENT_AMBULATORY_CARE_PROVIDER_SITE_OTHER): Payer: Medicare Other | Admitting: Vascular Surgery

## 2014-02-20 ENCOUNTER — Ambulatory Visit (HOSPITAL_COMMUNITY)
Admission: RE | Admit: 2014-02-20 | Discharge: 2014-02-20 | Disposition: A | Discharge status: Home / Self Care | Payer: Medicare Other | Source: Ambulatory Visit | Attending: Vascular Surgery | Admitting: Vascular Surgery

## 2014-02-20 VITALS — BP 132/73 | HR 79 | Temp 98.3°F | Ht 73.0 in | Wt 219.0 lb

## 2014-02-20 DIAGNOSIS — I739 Peripheral vascular disease, unspecified: Secondary | ICD-10-CM | POA: Insufficient documentation

## 2014-02-20 DIAGNOSIS — L97909 Non-pressure chronic ulcer of unspecified part of unspecified lower leg with unspecified severity: Secondary | ICD-10-CM | POA: Insufficient documentation

## 2014-02-20 NOTE — Progress Notes (Signed)
Patient name: [name] MRN: [id number] DOB: 22-Nov-1956 Sex: male   Referred by: Barkley Bruns  Reason for referral:  Chief Complaint  Patient presents with  . New Evaluation    slow healing wound L 2nd and 3rd toes    HISTORY OF PRESENT ILLNESS: Presents today for evaluation of slow healing of his toes. Does report some pain in both feet. Walks without difficulty. No claudication type symptoms  Past Medical History  Diagnosis Date  . Pneumonia     hx of-about 29yrs ago  . Chronic back pain   . History of colon polyps   . Mental disorder     bipolar  . Diabetes mellitus without complication   . DVT (deep venous thrombosis)   . Peripheral vascular disease     Past Surgical History  Procedure Laterality Date  . Shoulder surgery  70's    right  . Appendectomy      Jasenia Weilbacher 74's  . Blood clot in groin  Ailish Prospero 80's  . Back surgery  05/2011    x 5  . Hernia repair      Dillan Candela 2000's  . Carpal tunnel release      left  . Cervical spine surgery  2009  . Colonoscopy      History   Social History  . Marital Status: Single    Spouse Name: N/A    Number of Children: N/A  . Years of Education: N/A   Occupational History  . Not on file.   Social History Main Topics  . Smoking status: Current Every Day Smoker -- 1.00 packs/day for 40 years    Types: Cigarettes  . Smokeless tobacco: Never Used     Comment: pt states that he is ready to quit smoking but thinks he will need patches advised him to speak with his PCP which he is seaing this Thursday 3/26  . Alcohol Use: No  . Drug Use: No  . Sexual Activity: Yes   Other Topics Concern  . Not on file   Social History Narrative  . No narrative on file    Family History  Problem Relation Age of Onset  . Anesthesia problems Neg Hx   . Hypotension Neg Hx   . Malignant hyperthermia Neg Hx   . Pseudochol deficiency Neg Hx   . Heart attack Father     Allergies as of 02/20/2014 - Review Complete 02/20/2014  Allergen  Reaction Noted  . Penicillins Hives and Rash 12/13/2007    Current Outpatient Prescriptions on File Prior to Visit  Medication Sig Dispense Refill  . QUEtiapine (SEROQUEL) 200 MG tablet Take 400-600 mg by mouth at bedtime.       . traZODone (DESYREL) 150 MG tablet Take 100 mg by mouth at bedtime.       . gabapentin (NEURONTIN) 600 MG tablet Take 100 mg by mouth 3 (three) times daily.        No current facility-administered medications on file prior to visit.     REVIEW OF SYSTEMS:  Positives indicated with an "X"  CARDIOVASCULAR:  [ ]  chest pain   [ ]  chest pressure   [ ]  palpitations   [ ]  orthopnea   [ ]  dyspnea on exertion   [ ]  claudication   [ ]  rest pain   x[x ] DVT   [ ]  phlebitis PULMONARY:   [ ]  productive cough   [ ]  asthma   [ ]  wheezing NEUROLOGIC:   [  x] weakness  [x ] paresthesias  [ ]  aphasia  [ ]  amaurosis  [ ]  dizziness HEMATOLOGIC:   [ ]  bleeding problems   [ ]  clotting disorders MUSCULOSKELETAL:  [ ]  joint pain   [ ]  joint swelling GASTROINTESTINAL: [ ]   blood in stool  [ ]   hematemesis GENITOURINARY:  [ ]   dysuria  [ ]   hematuria PSYCHIATRIC:  [x ] history of major depression INTEGUMENTARY:  [ ]  rashes  [x ] ulcers CONSTITUTIONAL:  [ ]  fever   [ ]  chills  PHYSICAL EXAMINATION:  General: The patient is a well-nourished male, in no acute distress. Vital signs are BP 132/73  Pulse 79  Temp(Src) 98.3 F (36.8 C) (Oral)  Ht 6\' 1"  (1.854 m)  Wt 219 lb (99.338 kg)  BMI 28.90 kg/m2  SpO2 98% Pulmonary: There is a good air exchange   Musculoskeletal: There are no major deformities.  There is no significant extremity pain. Neurologic: No focal weakness or paresthesias are detected, Skin: Scaling on both feet. Absent nail on the second toe on the left. Thickening of the nail beds otherwise. Psychiatric: The patient has normal affect. Cardiovascular: 2+ radial and 2+ posterior tibial pulses   [facility] Vascular Lab Studies:  Ordered and Independently Reviewed  normal ankle arm index bilaterally. Normal triphasic waveforms.   Impression and Plan:  No evidence of lower extremity arterial insufficiency. He should have the ability to heal this without difficulty. No contraindication to treatment is required to his nail beds. We'll see [place] on an as-needed basis    Osten Janek [facility] Office: [phone]

## 2014-03-05 ENCOUNTER — Ambulatory Visit
Admission: RE | Admit: 2014-03-05 | Discharge: 2014-03-05 | Disposition: A | Discharge status: Home / Self Care | Payer: Medicare Other | Source: Ambulatory Visit | Attending: Internal Medicine | Admitting: Internal Medicine

## 2014-03-05 DIAGNOSIS — C220 Liver cell carcinoma: Secondary | ICD-10-CM

## 2014-03-22 ENCOUNTER — Other Ambulatory Visit (HOSPITAL_COMMUNITY): Payer: Self-pay | Admitting: Nurse Practitioner

## 2014-03-22 DIAGNOSIS — B182 Chronic viral hepatitis C: Secondary | ICD-10-CM

## 2014-03-23 ENCOUNTER — Other Ambulatory Visit: Payer: Self-pay | Admitting: Radiology

## 2014-03-26 ENCOUNTER — Encounter (HOSPITAL_COMMUNITY): Payer: Self-pay | Admitting: Pharmacy Technician

## 2014-03-28 ENCOUNTER — Other Ambulatory Visit: Payer: Self-pay | Admitting: Radiology

## 2014-03-30 ENCOUNTER — Ambulatory Visit (HOSPITAL_COMMUNITY)
Admission: RE | Admit: 2014-03-30 | Discharge: 2014-03-30 | Disposition: A | Discharge status: Home / Self Care | Payer: Medicare Other | Source: Ambulatory Visit | Attending: Nurse Practitioner | Admitting: Nurse Practitioner

## 2014-03-30 ENCOUNTER — Encounter (HOSPITAL_COMMUNITY): Payer: Self-pay

## 2014-03-30 DIAGNOSIS — B1920 Unspecified viral hepatitis C without hepatic coma: Secondary | ICD-10-CM | POA: Insufficient documentation

## 2014-03-30 DIAGNOSIS — B182 Chronic viral hepatitis C: Secondary | ICD-10-CM

## 2014-03-30 DIAGNOSIS — E119 Type 2 diabetes mellitus without complications: Secondary | ICD-10-CM | POA: Insufficient documentation

## 2014-03-30 LAB — CBC
HEMATOCRIT: 37.4 % — AB (ref 39.0–52.0)
Hemoglobin: 13.3 g/dL (ref 13.0–17.0)
MCH: 32.5 pg (ref 26.0–34.0)
MCHC: 35.6 g/dL (ref 30.0–36.0)
MCV: 91.4 fL (ref 78.0–100.0)
Platelets: 128 10*3/uL — ABNORMAL LOW (ref 150–400)
RBC: 4.09 MIL/uL — AB (ref 4.22–5.81)
RDW: 11.2 % — ABNORMAL LOW (ref 11.5–15.5)
WBC: 6.2 10*3/uL (ref 4.0–10.5)

## 2014-03-30 LAB — PROTIME-INR
INR: 0.97 (ref 0.00–1.49)
PROTHROMBIN TIME: 12.7 s (ref 11.6–15.2)

## 2014-03-30 LAB — GLUCOSE, CAPILLARY: GLUCOSE-CAPILLARY: 348 mg/dL — AB (ref 70–99)

## 2014-03-30 LAB — APTT: aPTT: 29 seconds (ref 24–37)

## 2014-03-30 MED ORDER — HYDROCODONE-ACETAMINOPHEN 5-325 MG PO TABS: 1.0000 | ORAL_TABLET | ORAL | Status: DC | PRN

## 2014-03-30 MED ORDER — SODIUM CHLORIDE 0.9 % IV SOLN: Freq: Once | INTRAVENOUS | Status: DC

## 2014-03-30 MED ORDER — FENTANYL CITRATE 0.05 MG/ML IJ SOLN
INTRAMUSCULAR | Status: AC | PRN
  Administered 2014-03-30 (×2): 50 ug via INTRAVENOUS

## 2014-03-30 MED ORDER — MIDAZOLAM HCL 2 MG/2ML IJ SOLN
INTRAMUSCULAR | Status: AC | PRN
  Administered 2014-03-30 (×2): 2 mg via INTRAVENOUS

## 2014-03-30 MED ORDER — MIDAZOLAM HCL 2 MG/2ML IJ SOLN
INTRAMUSCULAR | Status: AC
  Filled 2014-03-30: qty 4

## 2014-03-30 MED ORDER — FENTANYL CITRATE 0.05 MG/ML IJ SOLN
INTRAMUSCULAR | Status: AC
  Filled 2014-03-30: qty 4

## 2014-03-30 NOTE — Discharge Instructions (Signed)
Liver Biopsy  Care After  These instructions give you information on caring for yourself after your procedure. Your doctor may also give you more specific instructions. Call your doctor if you have any problems or questions after your procedure.  HOME CARE  · Watch for bleeding at your biopsy site.  · No heavy lifting, pushing, or pulling for 48 hours (2 days).  · No exercise, jogging, or sex for 48 hours (2 days).  · Do not drive or use heavy machinery for 24 hours (1 day).  · Go back to your usual diet and medicines as told by your doctor.  · Do not take the bandage off until the next morning.  · Only take medicine as told by your doctor.  · Do not shower or bathe until the next day.  GET HELP RIGHT AWAY IF:  · You have shortness of breath or trouble breathing.  · You have pain or cramping in your belly (abdomen).  · You feel sick to your stomach (nauseous) or throw up (vomit).  · Bleeding does not stop from the place where the needle was put in. Press on the place that is bleeding until you are checked in the Emergency Room.  · Yellowish white fluid (pus) is coming from the place where the needle was put in.  · You have any unusual pain that will not stop.  · You have puffiness (swelling) or redness at the place where the needle was put in, or if the place is very sore or hot when you touch it.  · You have a fever of more than 102° F (38.9° C) for 2 or more days.  · You have black, smelly poops (bowel movements).  If you go to the Emergency Room, tell the nurse that you had a liver biopsy. Take this paper with you and show it to the nurse. Keep your follow-up appointment.  MAKE SURE YOU:  · Understand these instructions.  · Will watch your condition.  · Will get help right away if you are not doing well or get worse.  Document Released: 08/25/2008 Document Revised: 02/08/2012 Document Reviewed: 08/25/2008  Exit[employer]® Patient Information ©2014 [employer].

## 2014-03-30 NOTE — Procedures (Signed)
Interventional Radiology Procedure Note  Procedure: Random core liver biopsy with US guidance Complications: None Recommendations: - Bedrest x 3 hrs  Signed,  Criselda Peaches, MD [facility] Rad[facility]

## 2014-03-30 NOTE — Progress Notes (Signed)
Kevin,PA notified of CBG and no orders at present

## 2014-03-30 NOTE — Sedation Documentation (Signed)
Sats 92-95% R AIR

## 2014-03-30 NOTE — H&P (Signed)
Chief Complaint: "I'm here for a liver biopsy" Referring 17, NP HPI: [name] is an 57 y.o. male with hepatitis C. He is here for staging random liver biopsy. PMHx and meds reviewed.  Past Medical History:  Past Medical History  Diagnosis Date  . Pneumonia     hx of-about 96yrs ago  . Chronic back pain   . History of colon polyps   . Mental disorder     bipolar  . Diabetes mellitus without complication   . DVT (deep venous thrombosis)   . Peripheral vascular disease     Past Surgical History:  Past Surgical History  Procedure Laterality Date  . Shoulder surgery  70's    right  . Appendectomy      early 74's  . Blood clot in groin  early 80's  . Back surgery  05/2011    x 5  . Hernia repair      early 2000's  . Carpal tunnel release      left  . Cervical spine surgery  2009  . Colonoscopy      Family History:  Family History  Problem Relation Age of Onset  . Anesthesia problems Neg Hx   . Hypotension Neg Hx   . Malignant hyperthermia Neg Hx   . Pseudochol deficiency Neg Hx   . Heart attack Father     Social History:  reports that he has been smoking Cigarettes.  He has a 40 pack-year smoking history. He has never used smokeless tobacco. He reports that he does not drink alcohol or use illicit drugs.  Allergies:  Allergies  Allergen Reactions  . Penicillins Hives and Rash    REACTION: HIVES, SEVERE ITCHING    Medications:   Medication List    ASK your doctor about these medications       benztropine 1 MG tablet  Commonly known as:  COGENTIN  Take 1.5 mg by mouth daily.     citalopram 40 MG tablet  Commonly known as:  CELEXA  Take 40 mg by mouth daily.     divalproex 250 MG DR tablet  Commonly known as:  DEPAKOTE  Take 250-500 mg by mouth 2 (two) times daily. Take 1 tablet in the morning and 2 tablets at bedtime     gabapentin 100 MG capsule  Commonly known as:  NEURONTIN  Take 100 mg by mouth 3 (three) times daily.     metFORMIN 500 MG tablet  Commonly known as:  GLUCOPHAGE  Take 500 mg by mouth 2 (two) times daily with a meal.     methocarbamol 500 MG tablet  Commonly known as:  ROBAXIN  Take 500 mg by mouth 2 (two) times daily as needed for muscle spasms.     mirtazapine 30 MG tablet  Commonly known as:  REMERON  Take 30 mg by mouth at bedtime.     naproxen 500 MG tablet  Commonly known as:  NAPROSYN  Take 500 mg by mouth 2 (two) times daily with a meal.     risperidone 4 MG tablet  Commonly known as:  RISPERDAL  Take 4 mg by mouth at bedtime.     risperiDONE 1 MG tablet  Commonly known as:  RISPERDAL  Take 1 mg by mouth daily.     traZODone 100 MG tablet  Commonly known as:  DESYREL  Take 150 mg by mouth at bedtime.        Please HPI for pertinent positives, otherwise complete 10 system  ROS negative.  Physical Exam: BP 116/66  Pulse 86  Temp(Src) 98.3 F (36.8 C) (Oral)  Resp 20  Ht 6\' 1"  (1.854 m)  Wt 214 lb (97.07 kg)  BMI 28.24 kg/m2  SpO2 97% Body mass index is 28.24 kg/(m^2).   General Appearance:  Alert, cooperative, no distress, appears stated age  Head:  Normocephalic, without obvious abnormality, atraumatic  ENT: Unremarkable  Neck: Supple, symmetrical, trachea midline  Lungs:   Clear to auscultation bilaterally, no w/r/r  Chest Wall:  No tenderness or deformity  Heart:  Regular rate and rhythm, S1, S2 normal, no murmur, rub or gallop.  Abdomen:   Soft, non-tender, non distended.  Extremities: Extremities normal, atraumatic, no cyanosis or edema  Pulses: 2+ and symmetric  Neurologic: Normal affect, no gross deficits.   Results for orders placed during the hospital encounter of 03/30/14 (from the past 48 hour(s))  GLUCOSE, CAPILLARY     Status: Abnormal   Collection Time    03/30/14  9:08 AM      Result Value Ref Range   Glucose-Capillary 348 (*) 70 - 99 mg/dL   No results found.  Assessment/Plan Hepatitis C For US guided liver biopsy Discussed  procedure, risks, complications, use of sedation. Labs pending Consent signed  Ascencion Dike PA-C 03/30/2014, 9:53 AM

## 2014-06-13 ENCOUNTER — Emergency Department (HOSPITAL_COMMUNITY)
Admission: EM | Admit: 2014-06-13 | Discharge: 2014-06-14 | Disposition: A | Discharge status: Other Acute Inpatient Hospital | Payer: Medicare Other | Attending: Emergency Medicine | Admitting: Emergency Medicine

## 2014-06-13 ENCOUNTER — Encounter (HOSPITAL_COMMUNITY): Payer: Self-pay | Admitting: Emergency Medicine

## 2014-06-13 DIAGNOSIS — F319 Bipolar disorder, unspecified: Secondary | ICD-10-CM | POA: Diagnosis not present

## 2014-06-13 DIAGNOSIS — Z79899 Other long term (current) drug therapy: Secondary | ICD-10-CM | POA: Diagnosis not present

## 2014-06-13 DIAGNOSIS — F332 Major depressive disorder, recurrent severe without psychotic features: Secondary | ICD-10-CM | POA: Diagnosis not present

## 2014-06-13 DIAGNOSIS — E119 Type 2 diabetes mellitus without complications: Secondary | ICD-10-CM | POA: Insufficient documentation

## 2014-06-13 DIAGNOSIS — Z8679 Personal history of other diseases of the circulatory system: Secondary | ICD-10-CM | POA: Diagnosis not present

## 2014-06-13 DIAGNOSIS — R45851 Suicidal ideations: Secondary | ICD-10-CM

## 2014-06-13 DIAGNOSIS — G8929 Other chronic pain: Secondary | ICD-10-CM | POA: Diagnosis not present

## 2014-06-13 DIAGNOSIS — Z8701 Personal history of pneumonia (recurrent): Secondary | ICD-10-CM | POA: Insufficient documentation

## 2014-06-13 DIAGNOSIS — Z860100 Personal history of colon polyps, unspecified: Secondary | ICD-10-CM | POA: Insufficient documentation

## 2014-06-13 DIAGNOSIS — F17200 Nicotine dependence, unspecified, uncomplicated: Secondary | ICD-10-CM | POA: Diagnosis not present

## 2014-06-13 DIAGNOSIS — R45850 Homicidal ideations: Secondary | ICD-10-CM | POA: Insufficient documentation

## 2014-06-13 DIAGNOSIS — Z880 Allergy status to penicillin: Secondary | ICD-10-CM | POA: Insufficient documentation

## 2014-06-13 DIAGNOSIS — Z791 Long term (current) use of non-steroidal anti-inflammatories (NSAID): Secondary | ICD-10-CM | POA: Insufficient documentation

## 2014-06-13 DIAGNOSIS — Z86718 Personal history of other venous thrombosis and embolism: Secondary | ICD-10-CM | POA: Insufficient documentation

## 2014-06-13 LAB — CBC
HEMATOCRIT: 38.4 % — AB (ref 39.0–52.0)
HEMOGLOBIN: 13.5 g/dL (ref 13.0–17.0)
MCH: 32.9 pg (ref 26.0–34.0)
MCHC: 35.2 g/dL (ref 30.0–36.0)
MCV: 93.7 fL (ref 78.0–100.0)
Platelets: 196 10*3/uL (ref 150–400)
RBC: 4.1 MIL/uL — ABNORMAL LOW (ref 4.22–5.81)
RDW: 11.8 % (ref 11.5–15.5)
WBC: 8.6 10*3/uL (ref 4.0–10.5)

## 2014-06-13 LAB — COMPREHENSIVE METABOLIC PANEL
ALK PHOS: 84 U/L (ref 39–117)
ALT: 9 U/L (ref 0–53)
ANION GAP: 13 (ref 5–15)
AST: 12 U/L (ref 0–37)
Albumin: 3.8 g/dL (ref 3.5–5.2)
BUN: 7 mg/dL (ref 6–23)
CHLORIDE: 102 meq/L (ref 96–112)
CO2: 24 mEq/L (ref 19–32)
CREATININE: 0.79 mg/dL (ref 0.50–1.35)
Calcium: 10.1 mg/dL (ref 8.4–10.5)
GFR calc non Af Amer: >90 mL/min (ref >90)
Glucose, Bld: 109 mg/dL — ABNORMAL HIGH (ref 70–99)
Potassium: 4.1 mEq/L (ref 3.7–5.3)
Sodium: 139 mEq/L (ref 137–147)
TOTAL PROTEIN: 8.1 g/dL (ref 6.0–8.3)
Total Bilirubin: 0.5 mg/dL (ref 0.3–1.2)

## 2014-06-13 LAB — RAPID URINE DRUG SCREEN, HOSP PERFORMED
Amphetamines: NOT DETECTED
BARBITURATES: NOT DETECTED
Benzodiazepines: NOT DETECTED
Cocaine: NOT DETECTED
Opiates: NOT DETECTED
TETRAHYDROCANNABINOL: NOT DETECTED

## 2014-06-13 LAB — VALPROIC ACID LEVEL: Valproic Acid Lvl: 32.4 ug/mL — ABNORMAL LOW (ref 50.0–100.0)

## 2014-06-13 LAB — ACETAMINOPHEN LEVEL

## 2014-06-13 LAB — ETHANOL

## 2014-06-13 LAB — AMMONIA: Ammonia: 46 umol/L (ref 11–60)

## 2014-06-13 LAB — SALICYLATE LEVEL: Salicylate Lvl: <2 mg/dL — ABNORMAL LOW (ref 2.8–20.0)

## 2014-06-13 MED ORDER — TRAZODONE HCL 50 MG PO TABS
150.0000 mg | ORAL_TABLET | Freq: Every day | ORAL | Status: DC
  Administered 2014-06-13: 150 mg via ORAL
  Filled 2014-06-13 (×2): qty 1

## 2014-06-13 MED ORDER — MIRTAZAPINE 30 MG PO TABS
30.0000 mg | ORAL_TABLET | Freq: Every day | ORAL | Status: DC
  Administered 2014-06-13: 30 mg via ORAL
  Filled 2014-06-13: qty 1

## 2014-06-13 MED ORDER — ONDANSETRON HCL 4 MG PO TABS: 4.0000 mg | ORAL_TABLET | Freq: Three times a day (TID) | ORAL | Status: DC | PRN

## 2014-06-13 MED ORDER — RISPERIDONE 2 MG PO TABS
4.0000 mg | ORAL_TABLET | Freq: Every day | ORAL | Status: DC
  Administered 2014-06-13: 4 mg via ORAL
  Filled 2014-06-13: qty 2

## 2014-06-13 MED ORDER — DIVALPROEX SODIUM 250 MG PO DR TAB: 250.0000 mg | DELAYED_RELEASE_TABLET | ORAL | Status: DC

## 2014-06-13 MED ORDER — DIVALPROEX SODIUM 500 MG PO DR TAB
500.0000 mg | DELAYED_RELEASE_TABLET | Freq: Every day | ORAL | Status: DC
  Administered 2014-06-13: 500 mg via ORAL
  Filled 2014-06-13: qty 1

## 2014-06-13 MED ORDER — RISPERIDONE 1 MG PO TABS
1.0000 mg | ORAL_TABLET | Freq: Every day | ORAL | Status: DC
  Administered 2014-06-13: 1 mg via ORAL
  Filled 2014-06-13: qty 1

## 2014-06-13 MED ORDER — NAPROXEN 500 MG PO TABS
500.0000 mg | ORAL_TABLET | Freq: Two times a day (BID) | ORAL | Status: DC
  Administered 2014-06-13 – 2014-06-14 (×2): 500 mg via ORAL
  Filled 2014-06-13 (×2): qty 1

## 2014-06-13 MED ORDER — GABAPENTIN 100 MG PO CAPS
100.0000 mg | ORAL_CAPSULE | Freq: Three times a day (TID) | ORAL | Status: DC
  Administered 2014-06-13 (×2): 100 mg via ORAL
  Filled 2014-06-13 (×8): qty 1

## 2014-06-13 MED ORDER — METHOCARBAMOL 500 MG PO TABS: 500.0000 mg | ORAL_TABLET | Freq: Two times a day (BID) | ORAL | Status: DC | PRN

## 2014-06-13 MED ORDER — METFORMIN HCL 500 MG PO TABS
500.0000 mg | ORAL_TABLET | Freq: Two times a day (BID) | ORAL | Status: DC
  Administered 2014-06-13 – 2014-06-14 (×2): 500 mg via ORAL
  Filled 2014-06-13 (×6): qty 1

## 2014-06-13 MED ORDER — NICOTINE 21 MG/24HR TD PT24
21.0000 mg | MEDICATED_PATCH | Freq: Every day | TRANSDERMAL | Status: DC
  Administered 2014-06-13: 21 mg via TRANSDERMAL
  Filled 2014-06-13: qty 1

## 2014-06-13 MED ORDER — DIVALPROEX SODIUM 250 MG PO DR TAB: 250.0000 mg | DELAYED_RELEASE_TABLET | Freq: Two times a day (BID) | ORAL | Status: DC

## 2014-06-13 MED ORDER — BENZTROPINE MESYLATE 1 MG PO TABS
1.5000 mg | ORAL_TABLET | Freq: Every day | ORAL | Status: DC
  Administered 2014-06-13: 1.5 mg via ORAL
  Filled 2014-06-13: qty 2

## 2014-06-13 MED ORDER — ACETAMINOPHEN 325 MG PO TABS: 650.0000 mg | ORAL_TABLET | ORAL | Status: DC | PRN

## 2014-06-13 MED ORDER — CITALOPRAM HYDROBROMIDE 40 MG PO TABS
40.0000 mg | ORAL_TABLET | Freq: Every day | ORAL | Status: DC
  Filled 2014-06-13: qty 1

## 2014-06-13 NOTE — BH Assessment (Signed)
Assessment Note  [name] is an 58 y.o. male who says that over the past 4 days he has not been able to sleep and has become depressed, suicidal and homicidal. He has had no triggers and says he has been taking his psych meds prescribed by [facility] as directed.  He says he is having thoughts of shooting himself and shooting "normal people" who "don't have to deal with this type of problem".  He does not have a gun, but says he can easily get one.  Pt says he lives by himself in an apartment and does not work because he is on disability. He is followed OP by [facility], and has had multiple past admissions to [facility], [facility], etc, with the most recent being about a year ago to [facility].  Pt uses a cane for multiple back operations, but says he is independent on stairs with a handrail and with his ADLs.  Pt denies SA, auditory hallucinations, abut says he has been wondering if he is having a few visual hallucinations since yesterday.  He says he is sleeping 2 hrs/night and has lost his appetite and has lost 7 lbs.  Pt was oriented to all but time/day, was calm and cooperative during assessment, and says he needs help.     Shuvon Rankin, NP, recommends IP treatment and says pt would be appropriate at [facility] if a bed is available.  Axis I: Bipolar, Depressed Axis II: Deferred Axis III:  Past Medical History  Diagnosis Date  . Pneumonia     hx of-about 36yrs ago  . Chronic back pain   . History of colon polyps   . Mental disorder     bipolar  . Diabetes mellitus without complication   . DVT (deep venous thrombosis)   . Peripheral vascular disease    Axis IV: None known Axis V: 31-40 impairment in reality testing  Past Medical History:  Past Medical History  Diagnosis Date  . Pneumonia     hx of-about 58yrs ago  . Chronic back pain   . History of colon polyps   . Mental disorder     bipolar  . Diabetes mellitus without complication   . DVT (deep venous thrombosis)   . Peripheral vascular  disease     Past Surgical History  Procedure Laterality Date  . Shoulder surgery  70's    right  . Appendectomy      early 48's  . Blood clot in groin  early 80's  . Back surgery  05/2011    x 5  . Hernia repair      early 2000's  . Carpal tunnel release      left  . Cervical spine surgery  2009  . Colonoscopy      Family History:  Family History  Problem Relation Age of Onset  . Anesthesia problems Neg Hx   . Hypotension Neg Hx   . Malignant hyperthermia Neg Hx   . Pseudochol deficiency Neg Hx   . Heart attack Father     Social History:  reports that he has been smoking Cigarettes.  He has a 40 pack-year smoking history. He has never used smokeless tobacco. He reports that he does not drink alcohol or use illicit drugs.  Additional Social History:  Alcohol / Drug Use Pain Medications: denies Prescriptions: denies Over the Counter: denies History of alcohol / drug use?:  (denies) Longest period of sobriety (when/how long): denies Negative Consequences of Use:  (denies)  CIWA: CIWA-Ar BP: 99/85 mmHg Pulse Rate: 89 COWS:    Allergies:  Allergies  Allergen Reactions  . Penicillins Hives and Rash    REACTION: HIVES, SEVERE ITCHING    Home Medications:  (Not in a hospital admission)  OB/GYN Status:  No LMP for male patient.  General Assessment Data Location of Assessment: [facility] Is this a Tele or Face-to-Face Assessment?: Face-to-Face Is this an Initial Assessment or a Re-assessment for this encounter?: Initial Assessment Living Arrangements: Alone Can pt return to current living arrangement?: Yes Admission Status: Voluntary Is patient capable of signing voluntary admission?: Yes Transfer from: Home Referral Source: Self/Family/Friend     [place] Living Arrangements: Alone Name of Psychiatrist: [facility] Sessions Name of Therapist: [facility]  Education Status Is patient currently in school?: No  Risk to self Suicidal Ideation:  Yes-Currently Present Suicidal Intent: Yes-Currently Present Is patient at risk for suicide?: Yes Suicidal Plan?: Yes-Currently Present Specify Current Suicidal Plan: shoot himself Access to Means: No (but says he can easily get a gun) What has been your use of drugs/alcohol within the last 12 months?:  (denies) Previous Attempts/Gestures: Yes How many times?: 3 Other Self Harm Risks:  (none known) Triggers for Past Attempts:  (depression) Intentional Self Injurious Behavior: None Recent stressful life event(s):  (none) Persecutory voices/beliefs?: No Depression: Yes Depression Symptoms: Despondent;Insomnia;Isolating;Fatigue;Loss of interest in usual pleasures;Feeling worthless/self pity;Feeling angry/irritable Substance abuse history and/or treatment for substance abuse?:  (denies) Suicide prevention information given to non-admitted patients: Not applicable  Risk to Others Homicidal Ideation: Yes-Currently Present Thoughts of Harm to Others: Yes-Currently Present Comment - Thoughts of Harm to Others:  (wants to hurt "normal people" who don't have to deal with pr) Current Homicidal Intent: Yes-Currently Present Current Homicidal Plan:  (shoot people) Access to Homicidal Means: No (but said he can get a gun) History of harm to others?: Yes Assessment of Violence: In distant past Violent Behavior Description:  (fighting) Does patient have access to weapons?: Yes (Comment) (can get a gun easily) Criminal Charges Pending?: No Does patient have a court date: No  Psychosis Hallucinations: Visual (wonders if he may be seeing things that are not there) Delusions: None noted  Mental Status Report Appear/Hygiene: Disheveled Eye Contact: Good Motor Activity: Restlessness Speech: Logical/coherent Level of Consciousness: Alert Mood: Depressed;Sad Affect: Sad;Depressed Anxiety Level: None Thought Processes: Coherent;Relevant Judgement: Unimpaired Orientation:  Person;Place;Situation Obsessive Compulsive Thoughts/Behaviors: None  Cognitive Functioning Concentration: Decreased Memory: Recent Intact;Remote Intact IQ: Average Insight: Fair Impulse Control: Good Appetite: Poor Weight Loss: 7 Weight Gain: 0 Sleep: Decreased Total Hours of Sleep: 2 Vegetative Symptoms: None  ADLScreening [facility] Assessment Services) Patient's cognitive ability adequate to safely complete daily activities?: Yes  Prior Inpatient Therapy Prior Inpatient Therapy: Yes Prior Therapy Dates:  (last admission was a year ago; multiple admissions) Prior Therapy Facilty/Provider(s):  ([facility], [facility], [facility]. ) Reason for Treatment:  (mood disorder)  Prior Outpatient Therapy Prior Outpatient Therapy: Yes Prior Therapy Dates: ongoing Prior Therapy Facilty/Provider(s): [facility] Reason for Treatment: mood disorder  ADL Screening (condition at time of admission) Patient's cognitive ability adequate to safely complete daily activities?: Yes Is the patient deaf or have difficulty hearing?: No Does the patient have difficulty seeing, even when wearing glasses/contacts?: No Does the patient have difficulty concentrating, remembering, or making decisions?: No Does the patient have difficulty dressing or bathing?: No Does the patient have difficulty walking or climbing stairs?:  (pt uses a cane, but can go up/down stairs with a handrail)  Home Assistive Devices/Equipment Home Assistive Devices/Equipment: Cane (specify quad or straight) (uses cane for back problems)    Abuse/Neglect Assessment (Assessment to be complete while patient is alone) Physical Abuse: Denies Verbal Abuse: Denies Sexual Abuse: Denies Exploitation of patient/patient's resources: Denies Self-Neglect: Denies Values / Beliefs Cultural Requests During Hospitalization: None Spiritual Requests During Hospitalization: None        Additional Information 1:1 In Past 12 Months?: No CIRT Risk:  No Elopement Risk: No Does patient have medical clearance?: Yes     Disposition:  Disposition Initial Assessment Completed for this Encounter: Yes Disposition of Patient: Inpatient treatment program Type of inpatient treatment program: Adult  On Site Evaluation by:   Reviewed with Physician:    Sheliah Hatch 06/13/2014 2:37 PM

## 2014-06-13 NOTE — ED Notes (Signed)
Patient belongings relocated to locker #30.

## 2014-06-13 NOTE — ED Provider Notes (Signed)
CSN: [id number]     Arrival date & time 06/13/14  1120 History  This chart was scribed for non-physician practitioner, Cherylann Parr, PA-C, working with Dorie Rank, MD by Ladene Artist, ED Scribe. This patient was seen in room WTR4/WLPT4 and the patient's care was started at 12:01 PM.   Chief Complaint  Patient presents with  . Suicidal   The history is provided by the patient. No language interpreter was used.   HPI Comments: [name] is a 58 y.o. male, with a h/o depression, who presents to the Emergency Department complaining of suicidal ideas and gradually worsening depression onset 4 days ago. Pt also reports difficulty falling asleep and staying asleep at night; which he believes triggered SI. He also reports increase in worrying about things when they do not go as planned. Pt plans to shoot himself. Pt denies access to a gun but states that he can get one. Pt reports suicide attempt approximately 2 years ago by pill overdose. He denies HI at this time but has reported HI over the past 4 days. Pt states that his plan was to "beat them". He has not seen a therapist in a while. Pt lives alone. He states that he has taken his medications as prescribed. Pt smokes 1 ppd; denies alcohol use or illicit drug use.  Allergy: PCN PCP: Jeanie Cooks  Past Medical History  Diagnosis Date  . Pneumonia     hx of-about 46yrs ago  . Chronic back pain   . History of colon polyps   . Mental disorder     bipolar  . Diabetes mellitus without complication   . DVT (deep venous thrombosis)   . Peripheral vascular disease    Past Surgical History  Procedure Laterality Date  . Shoulder surgery  70's    right  . Appendectomy      early 1's  . Blood clot in groin  early 80's  . Back surgery  05/2011    x 5  . Hernia repair      early 2000's  . Carpal tunnel release      left  . Cervical spine surgery  2009  . Colonoscopy     Family History  Problem Relation Age of Onset  . Anesthesia  problems Neg Hx   . Hypotension Neg Hx   . Malignant hyperthermia Neg Hx   . Pseudochol deficiency Neg Hx   . Heart attack Father    History  Substance Use Topics  . Smoking status: Current Every Day Smoker -- 1.00 packs/day for 40 years    Types: Cigarettes  . Smokeless tobacco: Never Used     Comment: pt states that he is ready to quit smoking but thinks he will need patches advised him to speak with his PCP which he is seaing this Thursday 3/26  . Alcohol Use: No    Review of Systems  Cardiovascular: Negative for chest pain.  Gastrointestinal: Negative for abdominal pain.  Psychiatric/Behavioral: Positive for suicidal ideas and dysphoric mood.  All other systems reviewed and are negative.  Allergies  Penicillins  Home Medications   Prior to Admission medications   Medication Sig Start Date End Date Taking? Authorizing Provider  benztropine (COGENTIN) 1 MG tablet Take 1.5 mg by mouth daily with breakfast.    Yes Historical Provider, MD  citalopram (CELEXA) 40 MG tablet Take 40 mg by mouth daily with breakfast.    Yes Historical Provider, MD  divalproex (DEPAKOTE) 250 MG DR tablet Take  250-500 mg by mouth 2 (two) times daily. Takes 250mg  in the morning and 500mg  in the evening   Yes Historical Provider, MD  gabapentin (NEURONTIN) 100 MG capsule Take 100 mg by mouth 3 (three) times daily.   Yes Historical Provider, MD  metFORMIN (GLUCOPHAGE) 500 MG tablet Take 500 mg by mouth 2 (two) times daily with a meal.   Yes Historical Provider, MD  methocarbamol (ROBAXIN) 500 MG tablet Take 500 mg by mouth 2 (two) times daily as needed for muscle spasms.   Yes Historical Provider, MD  mirtazapine (REMERON) 30 MG tablet Take 30 mg by mouth at bedtime.   Yes Historical Provider, MD  naproxen (NAPROSYN) 500 MG tablet Take 500 mg by mouth 2 (two) times daily with a meal.   Yes Historical Provider, MD  risperiDONE (RISPERDAL) 1 MG tablet Take 1 mg by mouth daily with breakfast.    Yes Historical  Provider, MD  risperidone (RISPERDAL) 4 MG tablet Take 4 mg by mouth at bedtime.    Yes Historical Provider, MD  sitaGLIPtin (JANUVIA) 100 MG tablet Take 100 mg by mouth daily with breakfast.   Yes Historical Provider, MD  traZODone (DESYREL) 100 MG tablet Take 150 mg by mouth at bedtime.   Yes Historical Provider, MD   Triage Vitals: BP 99/85  Pulse 89  Temp(Src) 98.8 F (37.1 C) (Oral)  Resp 16  SpO2 97% Physical Exam  Nursing note and vitals reviewed. Constitutional: He is oriented to person, place, and time. He appears well-developed and well-nourished. No distress.  HENT:  Head: Normocephalic and atraumatic.  Mouth/Throat: No oropharyngeal exudate.  Eyes: Conjunctivae and EOM are normal. Pupils are equal, round, and reactive to light. No scleral icterus.  Neck: Normal range of motion. Neck supple. No JVD present. No thyromegaly present.  Cardiovascular: Normal rate, regular rhythm, normal heart sounds and intact distal pulses.  Exam reveals no gallop and no friction rub.   No murmur heard. Pulmonary/Chest: Effort normal. No respiratory distress. He has no wheezes. He has no rales. He exhibits no tenderness.  Musculoskeletal: Normal range of motion.  Lymphadenopathy:    He has no cervical adenopathy.  Neurological: He is alert and oriented to person, place, and time. No cranial nerve deficit. Coordination normal.  Skin: Skin is warm and dry. He is not diaphoretic.  Psychiatric: His speech is normal. Judgment normal. He is slowed and withdrawn. Thought content is not paranoid and not delusional. Cognition and memory are not impaired. He exhibits a depressed mood. He expresses homicidal and suicidal ideation. He expresses suicidal plans and homicidal plans.   ED Course  Procedures (including critical care time) DIAGNOSTIC STUDIES: Oxygen Saturation is 97% on RA, normal by my interpretation.    COORDINATION OF CARE: 12:11 PM-Discussed treatment plan with pt at bedside and pt  agreed to plan.   Labs Review Labs Reviewed  CBC - Abnormal; Notable for the following:    RBC 4.10 (*)    HCT 38.4 (*)    All other components within normal limits  COMPREHENSIVE METABOLIC PANEL - Abnormal; Notable for the following:    Glucose, Bld 109 (*)    All other components within normal limits  SALICYLATE LEVEL - Abnormal; Notable for the following:    Salicylate Lvl <6.9 (*)    All other components within normal limits  VALPROIC ACID LEVEL - Abnormal; Notable for the following:    Valproic Acid Lvl 32.4 (*)    All other components within normal limits  ACETAMINOPHEN LEVEL  ETHANOL  URINE RAPID DRUG SCREEN (HOSP PERFORMED)  AMMONIA   Imaging Review No results found.   EKG Interpretation None      MDM   Final diagnoses:  Major depressive disorder, recurrent, severe without psychotic features  Suicidal ideations  Homicidal ideations   Patient is a 58 y.o. Male with a history of depression and suicidal attempts.  Basic labs have been drawn here and the Psych ED order sets have been used at this time.  Labs are currently pending.  Consult to TTS has been placed.  Would appreciate their input in further treatment and care at this time.    I personally performed the services described in this documentation, which was scribed in my presence. The recorded information has been reviewed and is accurate.      Cherylann Parr, PA-C 06/13/14 2138

## 2014-06-13 NOTE — ED Notes (Signed)
Per sitter pt. Given meal and ate 100%.

## 2014-06-13 NOTE — ED Notes (Signed)
Pt states having SI, states would shoot self w/ gun, doesn't have one but could get access to one, pt having HI thoughts also, states "anyone I can get my hands on".

## 2014-06-13 NOTE — ED Notes (Signed)
Given ice cream as requested.

## 2014-06-13 NOTE — ED Notes (Signed)
Pt made aware of need for urine specimen states he can not void at this time

## 2014-06-13 NOTE — ED Notes (Signed)
Per Caryl Pina from [place]: Pt accepted at [facility] by Dr. Abbey Chatters. Pt can go to [facility] after 8am on 06/14/14.  Report can be called at [phone] when the pt is on his way there.

## 2014-06-13 NOTE — ED Notes (Signed)
Pt has white tennis shoes socks blue pants white shirt black cricket flip phone

## 2014-06-13 NOTE — ED Notes (Signed)
Pt has 2 bags of belongings placed in locker 29

## 2014-06-13 NOTE — ED Notes (Signed)
Bed: [facility] Expected date:  Expected time:  Means of arrival:  Comments: TCU 30

## 2014-06-14 ENCOUNTER — Inpatient Hospital Stay: Payer: Self-pay | Admitting: Psychiatry

## 2014-06-14 DIAGNOSIS — R45851 Suicidal ideations: Secondary | ICD-10-CM | POA: Diagnosis not present

## 2014-06-14 NOTE — ED Notes (Addendum)
Attempted to call report.  No one answered.  [phone]

## 2014-06-14 NOTE — ED Provider Notes (Signed)
Medical screening examination/treatment/procedure(s) were performed by non-physician practitioner and as supervising physician I was immediately available for consultation/collaboration.   Dorie Rank, MD 06/14/14 [phone]

## 2014-06-14 NOTE — [facility] Counselor (Signed)
Writer called Larkin Ina at [facility] and let him know pt had been accepted to [facility] and will be going there instead. Pt signed [place] voluntary consent which Probation officer faxed to [employer] at [facility]. Acey Lav RN will contact [facility] to arrange transportation.  Arnold Long, [place] Assessment Counselor

## 2014-06-14 NOTE — ED Provider Notes (Signed)
  Physical Exam  BP 145/81  Pulse 93  Temp(Src) 97.4 F (36.3 C) (Oral)  Resp 18  SpO2 93%  Physical Exam  ED Course  Procedures  Patient accepted at [facility] under Dr. Bary Leriche. Stable for transfer.     Wandra Arthurs, MD 06/14/14 [phone]

## 2014-06-14 NOTE — [facility] Counselor (Signed)
Per [facility]), pt accepted by [facility] for tx; #[id number]. Pls call report to [phone]. Pt can be transported after 8am.

## 2014-06-14 NOTE — ED Notes (Signed)
Received call from [facility] at [facility] stating pt was going to be accepted there and that she would call back later with a bed number.  She is asking that pt be transported after 8am.

## 2014-06-14 NOTE — ED Notes (Signed)
Pt asleep.  Sitter at bedside.  NAD.  Rise and fall of chest noted.

## 2014-06-14 NOTE — ED Notes (Signed)
[facility] arrived to transport patient.  I attempted to get patient's medications for him before he left but pharmacy did not refill pt's meds this morning and it would have caused a delay in patient transport.  Pt ambulated to computer to sign from hallway to nurses station without the assistance of a cane.

## 2014-06-14 NOTE — ED Notes (Signed)
Report given to Janet, RN

## 2014-06-15 LAB — LIPID PANEL
Cholesterol: 177 mg/dL (ref 0–200)
HDL Cholesterol: 25 mg/dL — ABNORMAL LOW (ref 40–60)
LDL CHOLESTEROL, CALC: 115 mg/dL — AB (ref 0–100)
Triglycerides: 184 mg/dL (ref 0–200)
VLDL CHOLESTEROL, CALC: 37 mg/dL (ref 5–40)

## 2014-06-15 LAB — HEMOGLOBIN A1C: Hemoglobin A1C: 6.5 % — ABNORMAL HIGH (ref 4.2–6.3)

## 2014-06-15 LAB — VALPROIC ACID LEVEL: Valproic Acid: 58 ug/mL

## 2014-06-15 NOTE — Progress Notes (Signed)
Pt has ACTT services with [facility].  [address], [place]

## 2014-06-29 ENCOUNTER — Encounter (HOSPITAL_COMMUNITY): Payer: Self-pay | Admitting: Emergency Medicine

## 2014-06-29 ENCOUNTER — Emergency Department (HOSPITAL_COMMUNITY)
Admission: EM | Admit: 2014-06-29 | Discharge: 2014-06-30 | Disposition: A | Discharge status: Other Acute Inpatient Hospital | Payer: Medicare Other | Attending: Emergency Medicine | Admitting: Emergency Medicine

## 2014-06-29 DIAGNOSIS — R45850 Homicidal ideations: Secondary | ICD-10-CM | POA: Diagnosis not present

## 2014-06-29 DIAGNOSIS — Z79899 Other long term (current) drug therapy: Secondary | ICD-10-CM | POA: Diagnosis not present

## 2014-06-29 DIAGNOSIS — Z8701 Personal history of pneumonia (recurrent): Secondary | ICD-10-CM | POA: Diagnosis not present

## 2014-06-29 DIAGNOSIS — F314 Bipolar disorder, current episode depressed, severe, without psychotic features: Secondary | ICD-10-CM | POA: Diagnosis present

## 2014-06-29 DIAGNOSIS — G8929 Other chronic pain: Secondary | ICD-10-CM | POA: Insufficient documentation

## 2014-06-29 DIAGNOSIS — F319 Bipolar disorder, unspecified: Secondary | ICD-10-CM | POA: Insufficient documentation

## 2014-06-29 DIAGNOSIS — Z860100 Personal history of colon polyps, unspecified: Secondary | ICD-10-CM | POA: Insufficient documentation

## 2014-06-29 DIAGNOSIS — E119 Type 2 diabetes mellitus without complications: Secondary | ICD-10-CM | POA: Insufficient documentation

## 2014-06-29 DIAGNOSIS — Z880 Allergy status to penicillin: Secondary | ICD-10-CM | POA: Insufficient documentation

## 2014-06-29 DIAGNOSIS — F17200 Nicotine dependence, unspecified, uncomplicated: Secondary | ICD-10-CM | POA: Diagnosis not present

## 2014-06-29 DIAGNOSIS — Z86718 Personal history of other venous thrombosis and embolism: Secondary | ICD-10-CM | POA: Diagnosis not present

## 2014-06-29 DIAGNOSIS — R45851 Suicidal ideations: Secondary | ICD-10-CM | POA: Diagnosis not present

## 2014-06-29 LAB — ETHANOL

## 2014-06-29 LAB — RAPID URINE DRUG SCREEN, HOSP PERFORMED
AMPHETAMINES: NOT DETECTED
BARBITURATES: NOT DETECTED
Benzodiazepines: NOT DETECTED
COCAINE: NOT DETECTED
Opiates: NOT DETECTED
Tetrahydrocannabinol: NOT DETECTED

## 2014-06-29 LAB — CBC WITH DIFFERENTIAL/PLATELET
BASOS ABS: 0.1 10*3/uL (ref 0.0–0.1)
Basophils Relative: 1 % (ref 0–1)
EOS ABS: 0.2 10*3/uL (ref 0.0–0.7)
EOS PCT: 3 % (ref 0–5)
HCT: 35.6 % — ABNORMAL LOW (ref 39.0–52.0)
Hemoglobin: 12.6 g/dL — ABNORMAL LOW (ref 13.0–17.0)
LYMPHS ABS: 3.9 10*3/uL (ref 0.7–4.0)
LYMPHS PCT: 44 % (ref 12–46)
MCH: 33.2 pg (ref 26.0–34.0)
MCHC: 35.4 g/dL (ref 30.0–36.0)
MCV: 93.7 fL (ref 78.0–100.0)
Monocytes Absolute: 0.8 10*3/uL (ref 0.1–1.0)
Monocytes Relative: 9 % (ref 3–12)
NEUTROS PCT: 43 % (ref 43–77)
Neutro Abs: 3.7 10*3/uL (ref 1.7–7.7)
Platelets: 195 10*3/uL (ref 150–400)
RBC: 3.8 MIL/uL — AB (ref 4.22–5.81)
RDW: 11.7 % (ref 11.5–15.5)
WBC: 8.6 10*3/uL (ref 4.0–10.5)

## 2014-06-29 LAB — COMPREHENSIVE METABOLIC PANEL
ALK PHOS: 69 U/L (ref 39–117)
ALT: 10 U/L (ref 0–53)
AST: 16 U/L (ref 0–37)
Albumin: 3.9 g/dL (ref 3.5–5.2)
Anion gap: 13 (ref 5–15)
BUN: 7 mg/dL (ref 6–23)
CALCIUM: 10 mg/dL (ref 8.4–10.5)
CO2: 25 mEq/L (ref 19–32)
Chloride: 104 mEq/L (ref 96–112)
Creatinine, Ser: 0.78 mg/dL (ref 0.50–1.35)
GFR calc Af Amer: >90 mL/min (ref >90)
GFR calc non Af Amer: >90 mL/min (ref >90)
GLUCOSE: 211 mg/dL — AB (ref 70–99)
POTASSIUM: 4.1 meq/L (ref 3.7–5.3)
Sodium: 142 mEq/L (ref 137–147)
TOTAL PROTEIN: 8.2 g/dL (ref 6.0–8.3)
Total Bilirubin: 0.3 mg/dL (ref 0.3–1.2)

## 2014-06-29 MED ORDER — ACETAMINOPHEN 325 MG PO TABS: 650.0000 mg | ORAL_TABLET | ORAL | Status: DC | PRN

## 2014-06-29 MED ORDER — NICOTINE 21 MG/24HR TD PT24
21.0000 mg | MEDICATED_PATCH | Freq: Every day | TRANSDERMAL | Status: DC
  Administered 2014-06-29 – 2014-06-30 (×2): 21 mg via TRANSDERMAL
  Filled 2014-06-29 (×2): qty 1

## 2014-06-29 MED ORDER — METHOCARBAMOL 500 MG PO TABS: 500.0000 mg | ORAL_TABLET | Freq: Two times a day (BID) | ORAL | Status: DC | PRN

## 2014-06-29 MED ORDER — RISPERIDONE 2 MG PO TABS
4.0000 mg | ORAL_TABLET | Freq: Every day | ORAL | Status: DC
  Administered 2014-06-29: 4 mg via ORAL
  Filled 2014-06-29: qty 2

## 2014-06-29 MED ORDER — DIVALPROEX SODIUM 250 MG PO DR TAB: 250.0000 mg | DELAYED_RELEASE_TABLET | Freq: Two times a day (BID) | ORAL | Status: DC

## 2014-06-29 MED ORDER — ZOLPIDEM TARTRATE 5 MG PO TABS: 5.0000 mg | ORAL_TABLET | Freq: Every evening | ORAL | Status: DC | PRN

## 2014-06-29 MED ORDER — BENZTROPINE MESYLATE 1 MG PO TABS
1.5000 mg | ORAL_TABLET | Freq: Every day | ORAL | Status: DC
  Administered 2014-06-30: 1.5 mg via ORAL
  Filled 2014-06-29: qty 2

## 2014-06-29 MED ORDER — IBUPROFEN 200 MG PO TABS: 600.0000 mg | ORAL_TABLET | Freq: Three times a day (TID) | ORAL | Status: DC | PRN

## 2014-06-29 MED ORDER — GABAPENTIN 100 MG PO CAPS
100.0000 mg | ORAL_CAPSULE | Freq: Three times a day (TID) | ORAL | Status: DC
  Administered 2014-06-29 – 2014-06-30 (×3): 100 mg via ORAL
  Filled 2014-06-29 (×3): qty 1

## 2014-06-29 MED ORDER — MIRTAZAPINE 30 MG PO TABS
30.0000 mg | ORAL_TABLET | Freq: Every day | ORAL | Status: DC
  Administered 2014-06-29: 30 mg via ORAL
  Filled 2014-06-29: qty 1

## 2014-06-29 MED ORDER — DIVALPROEX SODIUM 250 MG PO DR TAB
250.0000 mg | DELAYED_RELEASE_TABLET | Freq: Every day | ORAL | Status: DC
  Administered 2014-06-30: 250 mg via ORAL
  Filled 2014-06-29: qty 1

## 2014-06-29 MED ORDER — ALUM & MAG HYDROXIDE-SIMETH 200-200-20 MG/5ML PO SUSP: 30.0000 mL | ORAL | Status: DC | PRN

## 2014-06-29 MED ORDER — ONDANSETRON HCL 4 MG PO TABS: 4.0000 mg | ORAL_TABLET | Freq: Three times a day (TID) | ORAL | Status: DC | PRN

## 2014-06-29 MED ORDER — RISPERIDONE 1 MG PO TABS
1.0000 mg | ORAL_TABLET | Freq: Every day | ORAL | Status: DC
  Administered 2014-06-30: 1 mg via ORAL
  Filled 2014-06-29: qty 1

## 2014-06-29 MED ORDER — TRAZODONE HCL 50 MG PO TABS
150.0000 mg | ORAL_TABLET | Freq: Every day | ORAL | Status: DC
  Administered 2014-06-29: 150 mg via ORAL
  Filled 2014-06-29 (×4): qty 1

## 2014-06-29 MED ORDER — METFORMIN HCL 500 MG PO TABS
500.0000 mg | ORAL_TABLET | Freq: Two times a day (BID) | ORAL | Status: DC
  Administered 2014-06-30 (×2): 500 mg via ORAL
  Filled 2014-06-29 (×3): qty 1

## 2014-06-29 MED ORDER — LINAGLIPTIN 5 MG PO TABS
5.0000 mg | ORAL_TABLET | Freq: Every day | ORAL | Status: DC
  Administered 2014-06-30: 5 mg via ORAL
  Filled 2014-06-29: qty 1

## 2014-06-29 MED ORDER — NAPROXEN 500 MG PO TABS
500.0000 mg | ORAL_TABLET | Freq: Two times a day (BID) | ORAL | Status: DC
  Administered 2014-06-30 (×2): 500 mg via ORAL
  Filled 2014-06-29 (×2): qty 1

## 2014-06-29 MED ORDER — DIVALPROEX SODIUM 500 MG PO DR TAB
500.0000 mg | DELAYED_RELEASE_TABLET | Freq: Every day | ORAL | Status: DC
  Administered 2014-06-29: 500 mg via ORAL
  Filled 2014-06-29: qty 1

## 2014-06-29 MED ORDER — CITALOPRAM HYDROBROMIDE 40 MG PO TABS
40.0000 mg | ORAL_TABLET | Freq: Every day | ORAL | Status: DC
  Administered 2014-06-30: 40 mg via ORAL
  Filled 2014-06-29 (×2): qty 1

## 2014-06-29 NOTE — ED Notes (Signed)
Patient changed into wine colored scrubs and wanded by security 
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
## 2014-06-29 NOTE — ED Provider Notes (Signed)
Medical screening examination/treatment/procedure(s) were performed by non-physician practitioner and as supervising physician I was immediately available for consultation/collaboration.   EKG Interpretation None        Orpah Greek, MD 06/29/14 2055

## 2014-06-29 NOTE — ED Notes (Signed)
Belongings searched by security (clothes, keys, tennis shoes, and glasses)

## 2014-06-29 NOTE — ED Provider Notes (Signed)
CSN: [id number]     Arrival date & time 06/29/14  1712 History   First MD Initiated Contact with Patient 06/29/14 1716     Chief Complaint  Patient presents with  . Suicidal  . Homicidal     (Consider location/radiation/quality/duration/timing/severity/associated sxs/prior Treatment) HPI  Patient to the ED with complaints of suicidal and homicidal ideation. He was discharged from [facility] within the past two weeks for suicidal ideation. He feels like he was discharged to early because he reports continuing to be suicidal and discharge, now he is homicidal. He does not want to discuss his homicidal plans but as far as suicidal ideation, he wants to  "shoot himself ".  He can not recall any specific situation to exacerbate his SI/HI.  He denies hallucinations, delusions, drug or alcohol use. He denies having any medical complaints. He says that he has been compliant with his medications.  Past Medical History  Diagnosis Date  . Pneumonia     hx of-about 69yrs ago  . Chronic back pain   . History of colon polyps   . Mental disorder     bipolar  . Diabetes mellitus without complication   . DVT (deep venous thrombosis)   . Peripheral vascular disease    Past Surgical History  Procedure Laterality Date  . Shoulder surgery  70's    right  . Appendectomy      early 82's  . Blood clot in groin  early 80's  . Back surgery  05/2011    x 5  . Hernia repair      early 2000's  . Carpal tunnel release      left  . Cervical spine surgery  2009  . Colonoscopy     Family History  Problem Relation Age of Onset  . Anesthesia problems Neg Hx   . Hypotension Neg Hx   . Malignant hyperthermia Neg Hx   . Pseudochol deficiency Neg Hx   . Heart attack Father    History  Substance Use Topics  . Smoking status: Current Every Day Smoker -- 1.00 packs/day for 40 years    Types: Cigarettes  . Smokeless tobacco: Never Used     Comment: pt states that he is ready to quit smoking but thinks he  will need patches advised him to speak with his PCP which he is seaing this Thursday 3/26  . Alcohol Use: No    Review of Systems   Review of Systems  Gen: no weight loss, fevers, chills, night sweats  Eyes: no discharge or drainage, no occular pain or visual changes  Nose: no epistaxis or rhinorrhea  Mouth: no dental pain, no sore throat  Neck: no neck pain  Lungs:No wheezing or hemoptysis No coughing CV:  No palpitations, dependent edema or orthopnea. No chest pain Abd: no diarrhea. No nausea or vomiting, No abdominal pain  GU: no dysuria or gross hematuria  MSK:  No muscle weakness, No  pain Neuro: no headache, no focal neurologic deficits  Skin: no rash , no wounds Psyche: + SI/HI    Allergies  Penicillins  Home Medications   Prior to Admission medications   Medication Sig Start Date End Date Taking? Authorizing Provider  benztropine (COGENTIN) 1 MG tablet Take 1.5 mg by mouth daily with breakfast.    Yes Historical Provider, MD  citalopram (CELEXA) 40 MG tablet Take 40 mg by mouth daily with breakfast.    Yes Historical Provider, MD  divalproex (DEPAKOTE) 250 MG DR tablet Take  250-500 mg by mouth 2 (two) times daily. Takes 250mg  in the morning and 500mg  in the evening   Yes Historical Provider, MD  gabapentin (NEURONTIN) 100 MG capsule Take 100 mg by mouth 3 (three) times daily.   Yes Historical Provider, MD  metFORMIN (GLUCOPHAGE) 500 MG tablet Take 500 mg by mouth 2 (two) times daily with a meal.   Yes Historical Provider, MD  methocarbamol (ROBAXIN) 500 MG tablet Take 500 mg by mouth 2 (two) times daily as needed for muscle spasms.   Yes Historical Provider, MD  mirtazapine (REMERON) 30 MG tablet Take 30 mg by mouth at bedtime.   Yes Historical Provider, MD  naproxen (NAPROSYN) 500 MG tablet Take 500 mg by mouth 2 (two) times daily with a meal.   Yes Historical Provider, MD  risperiDONE (RISPERDAL) 1 MG tablet Take 1 mg by mouth daily with breakfast.    Yes Historical  Provider, MD  risperidone (RISPERDAL) 4 MG tablet Take 4 mg by mouth at bedtime.    Yes Historical Provider, MD  sitaGLIPtin (JANUVIA) 100 MG tablet Take 100 mg by mouth daily with breakfast.   Yes Historical Provider, MD  traZODone (DESYREL) 100 MG tablet Take 150 mg by mouth at bedtime.   Yes Historical Provider, MD   BP 131/65  Pulse 93  Temp(Src) 98.6 F (37 C) (Oral)  Resp 18  SpO2 93% Physical Exam  Nursing note and vitals reviewed. Constitutional: He appears well-developed and well-nourished. No distress.  HENT:  Head: Normocephalic and atraumatic.  Eyes: Pupils are equal, round, and reactive to light.  Neck: Normal range of motion. Neck supple.  Cardiovascular: Normal rate and regular rhythm.   Pulmonary/Chest: Effort normal.  Abdominal: Soft.  Neurological: He is alert.  Skin: Skin is warm and dry.  Psychiatric: His mood appears not anxious. His speech is not rapid and/or pressured. He is not slowed and not withdrawn. He exhibits a depressed mood. He expresses homicidal and suicidal ideation. He expresses suicidal plans. He expresses no homicidal plans.    ED Course  Procedures (including critical care time) Labs Review Labs Reviewed  CBC WITH DIFFERENTIAL - Abnormal; Notable for the following:    RBC 3.80 (*)    Hemoglobin 12.6 (*)    HCT 35.6 (*)    All other components within normal limits  COMPREHENSIVE METABOLIC PANEL - Abnormal; Notable for the following:    Glucose, Bld 211 (*)    All other components within normal limits  URINE RAPID DRUG SCREEN (HOSP PERFORMED)  ETHANOL    Imaging Review No results found.   EKG Interpretation None      MDM   Final diagnoses:  Suicidal ideation  Homicidal ideation    TTS consult placed Psych ED holding orders in place Home meds reviewed. Labs pending  Spoke with Beverely Low from TTS who is going to evaluate patient.  Linus Mako, PA-C 06/29/14 2052

## 2014-06-29 NOTE — ED Notes (Addendum)
Patient states he is suicidal and has a plan to shoot himself. Patient also states he wants to hurt other people,but stated he did not want to tell his plan on how he would hurt them. Patient denies any drug or alcohol use. Patient brought over from [place].

## 2014-06-30 DIAGNOSIS — R45851 Suicidal ideations: Secondary | ICD-10-CM | POA: Diagnosis not present

## 2014-06-30 DIAGNOSIS — F314 Bipolar disorder, current episode depressed, severe, without psychotic features: Secondary | ICD-10-CM | POA: Diagnosis present

## 2014-06-30 LAB — CBG MONITORING, ED: Glucose-Capillary: 138 mg/dL — ABNORMAL HIGH (ref 70–99)

## 2014-06-30 NOTE — ED Notes (Signed)
Report called to [employer] at [facility]. Accepting psychiatrist is Dr.Carlton. Will be transported via [employer].

## 2014-06-30 NOTE — ED Notes (Signed)
Sheriffs deputy called stating it would be after 5 before they could be here.

## 2014-06-30 NOTE — BH Assessment (Signed)
[place] Assessment Progress Note   Pt has been accepted to [facility] according to [place] there.  She said that Dr. Wilber Oliphant is the accepting.  Nurse call report number is [phone].  IVC paperwork is being completed by Dr. Wilson Singer.  Arnell Sieving, the Network engineer, is aware of need to notarize and send to [employer].  Nursing staff in [place] is aware of status of patient.

## 2014-06-30 NOTE — ED Notes (Signed)
Message left on answering machine at [place] requesting transportation to [facility].

## 2014-06-30 NOTE — ED Notes (Signed)
IVC process started for [place] transfer.

## 2014-06-30 NOTE — ED Notes (Signed)
Notified Associate Professor at [facility] of 1600 and 1700 meds given prior to him leaving. Those meds were Neurontin 100mg , Glucophage 500mg  and Naproxen 500mg . Therapist, music here to transport to [facility]. Belongings bag x1 given to deputy. Ambulatory without difficulty. Denies pain. No complaints voiced.

## 2014-06-30 NOTE — ED Notes (Signed)
Attempted to call report to RN at [facility]. Per Katrina they have 1 RN for 13 patients and she is giving meds. Requested I call back at 10:00.

## 2015-02-14 ENCOUNTER — Other Ambulatory Visit (HOSPITAL_COMMUNITY): Payer: Self-pay | Admitting: Internal Medicine

## 2015-02-14 DIAGNOSIS — I739 Peripheral vascular disease, unspecified: Secondary | ICD-10-CM

## 2015-02-14 DIAGNOSIS — R609 Edema, unspecified: Secondary | ICD-10-CM

## 2015-02-18 ENCOUNTER — Encounter (HOSPITAL_COMMUNITY): Payer: Medicare Other

## 2015-02-19 ENCOUNTER — Encounter (HOSPITAL_COMMUNITY): Payer: Medicare Other

## 2015-03-23 NOTE — H&P (Signed)
PATIENT NAME:  [name], [name] MR#:  [id number] DATE OF BIRTH:  12-30-1955  DATE OF ADMISSION:  06/14/2014  REFERRING PHYSICIAN: [facility] M.D.   ATTENDING PHYSICIAN: Dontae Minerva B. Bary Leriche, M.D.   IDENTIFYING DATA: [name] is a 59 year old male with history of schizoaffective disorder.   CHIEF COMPLAINT: "I'm suicidal."   HISTORY OF PRESENT ILLNESS: [name] has a long history of mental illness.  He has been a patient at [facility], stable on his medications. Last hospitalization was 2-1/2 years ago. For the past week or so, he has not been able to sleep.  Since Monday, he developed auditory hallucinations with voices telling him to hurt himself, paranoia, and suicidal thoughts. He had a plan to use the gun, even though he does not have access to guns. The patient is truly puzzled because he has been good taking his medications and attending his doctor's appointments. He does not use drugs or alcohol, yet his condition worsened. He denies symptoms suggestive of bipolar mania. There are no symptoms of anxiety.  The patient reports some depression with poor sleep, decreased appetite, anhedonia, social isolation, feelings of guilt, crying spells, and now suicidal ideation.   PAST PSYCHIATRIC HISTORY: He was diagnosed many years ago, does not remember when.  He is disabled from mental illness. He has been tried on numerous medications, but believes that the current regimen of Depakote and Risperdal has been the best ever.  He has at least 2 suicide attempts; one by overdose and one by putting gun into his mouth that then misfired.  He used to use drugs in the 80s, but has been clean of drugs and alcohol for years now.   FAMILY PSYCHIATRIC HISTORY: None reported.   PAST MEDICAL HISTORY: Diabetes and difficulty walking.   ALLERGIES: UNKNOWN AT THE TIME OF DICTATION.   MEDICATIONS ON ADMISSION: Metformin 500 mg daily, Robaxin 500 mg daily, Remeron 30 mg at bedtime, naproxen 500 mg  daily twice daily, Risperdal 1 mg in the morning and 4 at bedtime, Januvia 100 mg daily, and Trazodone 150 mg at bedtime,   SOCIAL HISTORY: He lives alone.  He was briefly married in the 38s. He has 2 children who are doing well, but they are not from his marriage. He lives in an apartment by himself. He has a friend who helps him with shopping that is a home health nurse coming daily.   REVIEW OF SYSTEMS:  CONSTITUTIONAL: No fevers or chills. No weight changes.  EYES: No double or blurred vision.  EAR, NOSE, AND THROAT: No hearing loss.  RESPIRATORY: No shortness of breath or cough.  CARDIOVASCULAR: No chest pain or orthopnea.  GASTROINTESTINAL: No abdominal pain, nausea, vomiting, or diarrhea.  GENITOURINARY: No incontinence or frequency.  ENDOCRINE: No heat or cold intolerance.  LYMPHATIC: No anemia or easy bruising.  INTEGUMENTARY: No acne or rash.  MUSCULOSKELETAL: No muscle or joint pain.  NEUROLOGIC: No tingling or weakness.  PSYCHIATRIC: See history of present illness for details.   PHYSICAL EXAMINATION:  VITAL SIGNS: Blood pressure 104/73, pulse 89, respirations 18, temperature 98.3.  GENERAL: This is an elderly gentleman in no acute distress.  HEENT: The pupils are equal, round, and reactive to light. Sclerae are anicteric.  NECK: Supple. No thyromegaly.  LUNGS: Clear to auscultation. No dullness to percussion.  HEART: Regular rhythm and rate. No murmurs, rubs, or gallops.  ABDOMEN: Soft, nontender, nondistended. Positive bowel sounds.  MUSCULOSKELETAL: Normal muscle strength in all extremities.  SKIN: No rashes  or bruises.  LYMPHATIC: No cervical adenopathy.  NEUROLOGIC: Cranial nerves II-XII are intact.   LABORATORY DATA: They were all performed at [facility] and are all within normal limits. Her urine toxicology screen is negative for substances.   MENTAL STATUS EXAMINATION ON ADMISSION: The patient is alert and oriented to person, place, time and  situation. He is pleasant, polite, and cooperative. He is adequately groomed, wearing hospital scrubs. He maintains good eye contact. His speech is slow and soft. Mood is depressed with flat affect. Thought process is logical and goal oriented. Thought content: He denies suicidal or homicidal ideation at the present, but was admitted for suicidal ideation with a plan. He complains of feeling paranoid and endorses auditory command hallucinations. His cognition is grossly intact. Registration, recall, short and long-term memory are good. He is of average intelligence and fund of knowledge. His insight and judgment are good.   SUICIDE RISK ASSESSMENT ON ADMISSION: This is a patient with a long history of depression, psychosis and mood instability, who came to the hospital for suicidal ideation in spite of good treatment compliance.   INITIAL DIAGNOSES:  AXIS I: Schizoaffective disorder, bipolar type.  AXIS II: Deferred.  AXIS III: Diabetes, chronic pain, difficulty walking. AXIS IV: Mental and physical illness.  AXIS V: Global assessment of functioning 25.   PLAN: The patient was admitted to [place] unit for safety, stabilization and medication management. He was initially placed on suicide precautions and was closely monitored for any unsafe behaviors. He underwent full psychiatric and risk assessment. He received pharmacotherapy, individual and group psychotherapy, substance abuse counseling, and support from therapeutic milieu.   1.  Suicidal ideation: The patient is able to contract for safety.   2.  Mood and psychosis: We will continue Risperdal and Celexa. We will increase Depakote to achieve therapeutic level.   3.  Diabetes. We will continue metformin and Januvia with blood glucose monitoring.   4.  Difficulty walking.  PT assessment. The patient uses a cane in the house. He is in a wheelchair at the moment here.    DISPOSITION: He will be  discharged to home.    ____________________________ Wardell Honour. Bary Leriche, MD jbp:ts D: 06/14/2014 12:53:11 ET T: 06/14/2014 13:30:10 ET JOB#: [id number]  cc: Lineth Thielke B. Bary Leriche, MD, <Dictator> Clovis Fredrickson MD ELECTRONICALLY SIGNED 06/21/2014 7:20

## 2015-04-10 ENCOUNTER — Ambulatory Visit (INDEPENDENT_AMBULATORY_CARE_PROVIDER_SITE_OTHER): Payer: Medicare Other | Admitting: Neurology

## 2015-04-10 ENCOUNTER — Encounter: Payer: Self-pay | Admitting: Neurology

## 2015-04-10 VITALS — BP 120/70 | HR 76 | Resp 16 | Ht 73.0 in | Wt 218.8 lb

## 2015-04-10 DIAGNOSIS — M5416 Radiculopathy, lumbar region: Secondary | ICD-10-CM

## 2015-04-10 DIAGNOSIS — R200 Anesthesia of skin: Secondary | ICD-10-CM | POA: Insufficient documentation

## 2015-04-10 DIAGNOSIS — R269 Unspecified abnormalities of gait and mobility: Secondary | ICD-10-CM | POA: Insufficient documentation

## 2015-04-10 DIAGNOSIS — M47816 Spondylosis without myelopathy or radiculopathy, lumbar region: Secondary | ICD-10-CM | POA: Diagnosis not present

## 2015-04-10 NOTE — Progress Notes (Signed)
[facility]  PATIENT: [name] DOB: 05-03-1956  REFERRING DOCTOR OR PCP:  Adam Phenix (PCP), referred by Basil Dess (Ortho) SOURCE: Patient, records form Ortho and EMR, images and reports on PACS  _________________________________   HISTORICAL  CHIEF COMPLAINT:  Chief Complaint  Patient presents with  . Gait disturbance    Hx. of several back surgeries. He reports difficulty walking for 3-4 mos.  He is unable to clearly communicate why--just sts. "they just go out."  His behavioral health nurse is with him and sts. "his feet get stuck to the floor" and she demonstrates a duck walk.  Sts. pt. has been eval by ortho for same and they have told him problem is not in his back/knees/fim    HISTORY OF PRESENT ILLNESS:  I had the pleasure of seeing your patient, [name], at [facility] for neurologic consultation regarding his worsening gait. As you know, he is a 59 year old man has had several lumbar operations as well as multiple injections for pain. He is status post L2-L5 fusion and has had several lumbar spine injections, most recently radiofrequency ablation to denervate the L5-S1 facet joints.    Several months ago he began to note more difficulty with his gait.  Over the past couple years he has mostly used a cane and has had an arthritic gait. Several months ago the gait worsened. He notes that the legs will give out. He feels off balance. His stride is smaller and he has more difficulty turning. The severity of his gait problems will fluctuate some. Around his apartment, he mostly uses a walls and furniture but will use a cane, walker or wheelchair depending on how bad his gait is doing when he is out of his apartment. He is falling frequently, as often as daily.  He does not note any change in bladder function. He seems to have some urinary frequency and nocturia but that existed before his recent gait worsening. He and his  behavioral health nurse do not believe that he has had any significant change in cognition over the last year.  He continues to note lower back pain and pain that runs down his legs, right worse than his left. The pain will go beneath the knee on the right. There is no association between the severity of his pain and the severity of his gait issues as the day goes on.  I personally reviewed an MRI of the cervical spine dated 03/16/2009. It shows a small syrinx at C2. There is multilevel degenerative changes with some foraminal narrowing but the spinal cord is not impinged.  REVIEW OF SYSTEMS: Constitutional: No fevers, chills, sweats, or change in appetite Eyes: No visual changes, double vision, eye pain Ear, nose and throat: No hearing loss, ear pain, nasal congestion, sore throat Cardiovascular: No chest pain, palpitations Respiratory: No shortness of breath at rest or with exertion.   No wheezes GastrointestinaI: No nausea, vomiting, diarrhea, abdominal pain, fecal incontinence Genitourinary: No dysuria, urinary retention or frequency.  No nocturia. Musculoskeletal: No neck pain, some back pain (was helped by RFA) Integumentary: No rash, pruritus, skin lesions Neurological: as above Psychiatric: ha bipolar and is followed by psychiatry.    Endocrine: No palpitations, diaphoresis, change in appetite, change in weigh or increased thirst.   Has DM Hematologic/Lymphatic: No anemia, purpura, petechiae. Allergic/Immunologic: No itchy/runny eyes, nasal congestion, recent allergic reactions, rashes  ALLERGIES: Allergies  Allergen Reactions  . Penicillins Hives and Rash    REACTION: HIVES,  SEVERE ITCHING    HOME MEDICATIONS:  Current outpatient prescriptions:  .  benztropine (COGENTIN) 1 MG tablet, Take 1.5 mg by mouth daily with breakfast. , Disp: , Rfl:  .  citalopram (CELEXA) 40 MG tablet, Take 40 mg by mouth daily with breakfast. , Disp: , Rfl:  .  divalproex (DEPAKOTE) 250 MG DR  tablet, Take 250-500 mg by mouth 2 (two) times daily. Takes 254m in the morning and 5063min the evening, Disp: , Rfl:  .  gabapentin (NEURONTIN) 100 MG capsule, Take 100 mg by mouth 3 (three) times daily., Disp: , Rfl:  .  metFORMIN (GLUCOPHAGE) 500 MG tablet, Take 500 mg by mouth 2 (two) times daily with a meal., Disp: , Rfl:  .  methocarbamol (ROBAXIN) 500 MG tablet, Take 500 mg by mouth 2 (two) times daily as needed for muscle spasms., Disp: , Rfl:  .  mirtazapine (REMERON) 30 MG tablet, Take 30 mg by mouth at bedtime., Disp: , Rfl:  .  naproxen (NAPROSYN) 500 MG tablet, Take 500 mg by mouth 2 (two) times daily with a meal., Disp: , Rfl:  .  risperiDONE (RISPERDAL) 1 MG tablet, Take 1 mg by mouth daily with breakfast. , Disp: , Rfl:  .  risperidone (RISPERDAL) 4 MG tablet, Take 4 mg by mouth at bedtime. , Disp: , Rfl:  .  sitaGLIPtin (JANUVIA) 100 MG tablet, Take 100 mg by mouth daily with breakfast., Disp: , Rfl:  .  traZODone (DESYREL) 100 MG tablet, Take 150 mg by mouth at bedtime., Disp: , Rfl:   PAST MEDICAL HISTORY: Past Medical History  Diagnosis Date  . Pneumonia     hx of-about 3y50yrgo  . Chronic back pain   . History of colon polyps   . Mental disorder     bipolar  . Diabetes mellitus without complication   . DVT (deep venous thrombosis)   . Peripheral vascular disease     PAST SURGICAL HISTORY: Past Surgical History  Procedure Laterality Date  . Shoulder surgery  70's    right  . Appendectomy      early 90'39's Blood clot in groin  early 80's  . Back surgery  05/2011    x 5  . Hernia repair      early 2000's  . Carpal tunnel release      left  . Cervical spine surgery  2009  . Colonoscopy      FAMILY HISTORY: Family History  Problem Relation Age of Onset  . Anesthesia problems Neg Hx   . Hypotension Neg Hx   . Malignant hyperthermia Neg Hx   . Pseudochol deficiency Neg Hx   . Heart attack Father     SOCIAL HISTORY:  History   Social History  .  Marital Status: Unknown    Spouse Name: N/A  . Number of Children: N/A  . Years of Education: N/A   Occupational History  . Not on file.   Social History Main Topics  . Smoking status: Current Every Day Smoker -- 1.00 packs/day for 40 years    Types: Cigarettes  . Smokeless tobacco: Never Used     Comment: pt states that he is ready to quit smoking but thinks he will need patches advised him to speak with his PCP which he is seaing this Thursday 3/26  . Alcohol Use: No  . Drug Use: No  . Sexual Activity: Yes   Other Topics Concern  . Not on file  Social History Narrative     PHYSICAL EXAM  Filed Vitals:   04/10/15 1334  BP: 120/70  Pulse: 76  Resp: 16  Height: 6' 1"  (1.854 m)  Weight: 218 lb 12.8 oz (99.247 kg)    Body mass index is 28.87 kg/(m^2).   General: The patient is well-developed and well-nourished and in no acute distress  Neck: The neck is supple, no carotid bruits are noted.  The neck is nontender.  Cardiovascular: The heart has a regular rate and rhythm with a normal S1 and S2. There were no murmurs, gallops or rubs. Lungs are clear to auscultation.  Skin: Extremities are without significant edema.  Musculoskeletal:  Back is mildly tender with reduced ROM  Neurologic Exam  Mental status: The patient is alert and oriented x 3 at the time of the examination. The patient has apparent normal recent and remote memory, with an apparently normal attention span and concentration ability.   Speech is normal.  Cranial nerves: Extraocular movements are full. Pupils are equal, round, and reactive to light and accomodation.    Facial symmetry is present. There is good facial sensation to soft touch bilaterally.Facial strength is normal.  Trapezius and sternocleidomastoid strength is normal. No dysarthria is noted.  The tongue is midline, and the patient has symmetric elevation of the soft palate. No obvious hearing deficits are noted.  Motor:  Muscle bulk is  normal.   Tone is normal. Strength is  5 / 5 in all 4 extremities.   Sensory: Sensory testing is intact to pinprick, soft touch and vibration sensation in arms and proximal leg but decreased vibration and temperature sensation in his toes/feet..  Coordination: Cerebellar testing reveals good finger-nose-finger and heel-to-shin bilaterally.  Gait and station: Station is mildly wide.   Gait is wide with short stride and he does a 4 step 180 degree turn.   He has retropulsion.   He cannot tandem walk.   Romberg is negative.   Reflexes: Deep tendon reflexes are symmetric and normal bilaterally.   Plantar responses are negative right and equivocal left.    DIAGNOSTIC DATA (LABS, IMAGING, TESTING) - I reviewed patient records, labs, notes, testing and imaging myself where available.  Lab Results  Component Value Date   WBC 8.6 06/29/2014   HGB 12.6* 06/29/2014   HCT 35.6* 06/29/2014   MCV 93.7 06/29/2014   PLT 195 06/29/2014      Component Value Date/Time   NA 142 06/29/2014 1755   K 4.1 06/29/2014 1755   CL 104 06/29/2014 1755   CO2 25 06/29/2014 1755   GLUCOSE 211* 06/29/2014 1755   BUN 7 06/29/2014 1755   CREATININE 0.78 06/29/2014 1755   CALCIUM 10.0 06/29/2014 1755   PROT 8.2 06/29/2014 1755   ALBUMIN 3.9 06/29/2014 1755   AST 16 06/29/2014 1755   ALT 10 06/29/2014 1755   ALKPHOS 69 06/29/2014 1755   BILITOT 0.3 06/29/2014 1755   GFRNONAA >90 06/29/2014 1755   GFRAA >90 06/29/2014 1755   No results found for: CHOL, HDL, LDLCALC, LDLDIRECT, TRIG, CHOLHDL Lab Results  Component Value Date   HGBA1C * 10/07/2007    6.5 (NOTE)   The ADA recommends the following therapeutic goals for glycemic   control related to Hgb A1C measurement:   Goal of Therapy:   < 7.0% Hgb A1C   Action Suggested:  > 8.0% Hgb A1C   Ref:  Diabetes Care, 22, Suppl. 1, 1999        ASSESSMENT AND  PLAN  Gait disturbance - Plan: Vitamin B12, Sedimentation rate, Multiple myeloma panel, serum, MR  Cervical Spine Wo Contrast, MR Brain Wo Contrast  Numbness - Plan: Vitamin B12, Sedimentation rate, Multiple myeloma panel, serum, MR Cervical Spine Wo Contrast, MR Brain Wo Contrast  Lumbar radicular pain  Lumbar spondylosis, unspecified spinal osteoarthritis   In summary, [name] is a 59 year old man with a progressive gait disorder over the past several months superimposed on chronic back pain and history of several lumbar procedures. On exam, he has some numbness in his feet but normal sensation above the ankles. He could have a polyneuropathy, probably from his diabetes, but it does not appear to be bad enough, by itself, to explain his gait issues. I will check some blood work including SPEP, ESR, B12 to make sure that there is not an alternative explanation and I would consider a nerve conduction/EMG if the rest of his workup is negative.   I am most concerned about the possibility of a cervical myelopathy or other CNS process including normal pressure hydrocephalus. Therefore, we will check a noncontrasted MRIs of the cervical spine and the brain to look for these and other CNS processes.  He will return to see me one month and they will call sooner if there are new or significant worsening in terms. Call [phone] (ACTT office at [facility]) with results    Richard A. Felecia Shelling, MD, PhD 12/24/2710, 9:29 PM Certified in Neurology, Clinical Neurophysiology, Sleep Medicine, Pain Medicine and Neuroimaging  [facility] [address], [place], [place] [address] [phone]

## 2015-04-15 LAB — MULTIPLE MYELOMA PANEL, SERUM
ALBUMIN SERPL ELPH-MCNC: 3.7 g/dL (ref 3.2–5.6)
ALBUMIN/GLOB SERPL: 1 (ref 0.7–2.0)
Alpha 1: 0.2 g/dL (ref 0.1–0.4)
Alpha2 Glob SerPl Elph-Mcnc: 0.9 g/dL (ref 0.4–1.2)
B-GLOBULIN SERPL ELPH-MCNC: 1.4 g/dL — AB (ref 0.6–1.3)
Gamma Glob SerPl Elph-Mcnc: 1.3 g/dL (ref 0.5–1.6)
Globulin, Total: 3.8 g/dL (ref 2.0–4.5)
IGG (IMMUNOGLOBIN G), SERUM: 1359 mg/dL (ref 700–1600)
IgA/Immunoglobulin A, Serum: 323 mg/dL (ref 90–386)
IgM (Immunoglobulin M), Srm: 127 mg/dL (ref 20–172)
Total Protein: 7.5 g/dL (ref 6.0–8.5)

## 2015-04-15 LAB — SEDIMENTATION RATE: Sed Rate: 19 mm/hr (ref 0–30)

## 2015-04-15 LAB — VITAMIN B12: Vitamin B-12: 671 pg/mL (ref 211–946)

## 2015-04-26 ENCOUNTER — Other Ambulatory Visit: Payer: Medicare Other

## 2015-04-26 ENCOUNTER — Inpatient Hospital Stay: Admission: RE | Admit: 2015-04-26 | Payer: Medicare Other | Source: Ambulatory Visit

## 2015-05-13 ENCOUNTER — Ambulatory Visit
Admission: RE | Admit: 2015-05-13 | Discharge: 2015-05-13 | Disposition: A | Discharge status: Home / Self Care | Payer: Medicare Other | Source: Ambulatory Visit | Attending: Neurology | Admitting: Neurology

## 2015-05-13 DIAGNOSIS — R200 Anesthesia of skin: Secondary | ICD-10-CM

## 2015-05-13 DIAGNOSIS — R269 Unspecified abnormalities of gait and mobility: Secondary | ICD-10-CM

## 2015-05-17 ENCOUNTER — Telehealth: Payer: Self-pay | Admitting: Neurology

## 2015-05-17 NOTE — Telephone Encounter (Signed)
Sharyn Lull wanted to know if the patient needs to come in for a follow up appt in order to receive his results or if someone will call with the results of the MRI. Please call and advise.

## 2015-05-20 NOTE — Telephone Encounter (Signed)
I have spoken with [name] this morning--pt. had mri on 05-13-15.  F/U appt. given with RAS on 05-29-15 at 1120.  I advised we may call with mri results prior to appt/fim

## 2015-05-21 ENCOUNTER — Telehealth: Payer: Self-pay | Admitting: *Deleted

## 2015-05-21 DIAGNOSIS — R269 Unspecified abnormalities of gait and mobility: Secondary | ICD-10-CM

## 2015-05-21 DIAGNOSIS — R200 Anesthesia of skin: Secondary | ICD-10-CM

## 2015-05-21 NOTE — Telephone Encounter (Signed)
-----   Message from Britt Bottom, MD sent at 05/21/2015  8:43 AM EDT ----- Mr. Voris cervical MRI looks ok with degenerative changes but  no compression of spinal cord.      Brain shows much more atrophy than expected for age.   Lets go ahead and set up NCV/EMG since we don't have clear cut answer for poor gait

## 2015-05-21 NOTE — Telephone Encounter (Signed)
I have spoken with Steve and per RAS, advised him of below mri results, and the need to schedule an emg/ncv to further investigate cause of gait disturbance.  He is aware and agreeable, and requests that we contact his behavioral health nurse, Sharyn Lull, to schedule.  Michelle's phone # is [phone].  I lmom for Sharyn Lull to call and I will advised that emg/ncv has been ordered, and once insurance has approved it, she should expect a call to schedule/fim

## 2015-05-21 NOTE — Telephone Encounter (Signed)
Sharyn Lull returned call. Please call and advise.

## 2015-05-22 ENCOUNTER — Telehealth: Payer: Self-pay | Admitting: *Deleted

## 2015-05-22 NOTE — Telephone Encounter (Signed)
-----   Message from Britt Bottom, MD sent at 05/21/2015  8:43 AM EDT ----- Mr. Ballantine cervical MRI looks ok with degenerative changes but  no compression of spinal cord.      Brain shows much more atrophy than expected for age.   Lets go ahead and set up NCV/EMG since we don't have clear cut answer for poor gait

## 2015-05-22 NOTE — Telephone Encounter (Signed)
Per pt. request, I have spoken with Sharyn Lull and advised that mri brain showed more atrophy than expected for age, and some degen. changes in c-spine, but nothing that would interfere with gait.  I have advised that Dr. Felecia Shelling has orered an emg/ncv to further investigate gait disturbance,and that Elman has requested our office contact Sharyn Lull to schedule this once insurance has approved.  She verbalized understanding of same/fim

## 2015-05-22 NOTE — Telephone Encounter (Signed)
I have spoken with  both [name] and his mental health nurse, [name] and given mri report as below, and Dr. Garth Bigness recommendation of emg/ncv.  [name] is agreeable, and requests that we schedule this with [name].  [name] is agreeable and is aware to expect call from our office to schedule emg/ncv once insurance has approved./fim

## 2015-05-29 ENCOUNTER — Ambulatory Visit: Payer: Medicare Other | Admitting: Neurology

## 2015-06-06 ENCOUNTER — Encounter: Payer: Self-pay | Admitting: Neurology

## 2015-06-06 ENCOUNTER — Encounter: Payer: Medicare Other | Admitting: Neurology

## 2016-01-27 ENCOUNTER — Ambulatory Visit: Payer: Medicare Other | Attending: Internal Medicine

## 2016-02-10 ENCOUNTER — Ambulatory Visit: Payer: Medicare Other | Attending: Internal Medicine

## 2016-02-10 DIAGNOSIS — R2681 Unsteadiness on feet: Secondary | ICD-10-CM | POA: Insufficient documentation

## 2016-02-10 DIAGNOSIS — M2560 Stiffness of unspecified joint, not elsewhere classified: Secondary | ICD-10-CM | POA: Diagnosis present

## 2016-02-10 DIAGNOSIS — M5441 Lumbago with sciatica, right side: Secondary | ICD-10-CM | POA: Insufficient documentation

## 2016-02-10 DIAGNOSIS — R293 Abnormal posture: Secondary | ICD-10-CM | POA: Diagnosis present

## 2016-02-10 DIAGNOSIS — M25659 Stiffness of unspecified hip, not elsewhere classified: Secondary | ICD-10-CM | POA: Insufficient documentation

## 2016-02-10 DIAGNOSIS — M6281 Muscle weakness (generalized): Secondary | ICD-10-CM | POA: Insufficient documentation

## 2016-02-10 DIAGNOSIS — M5442 Lumbago with sciatica, left side: Secondary | ICD-10-CM | POA: Insufficient documentation

## 2016-02-10 NOTE — Patient Instructions (Signed)
From cabinet issued sitting posture with support and exercises for abdominal setting , bridge, LTR , knee to chest stretch  3-15 reps 10-20 sec stretching 2 x /day

## 2016-02-10 NOTE — Therapy (Addendum)
[place], [place], [address] Phone: [phone]   Fax:  [phone]  Physical Therapy Evaluation/Discharge  Patient Details  Name: [name] MRN: [id number] Date of Birth: 19-Apr-1956 Referring Provider: Pierce Crane, MD  Encounter Date: 02/10/2016      PT End of Session - 02/10/16 1504    Visit Number 1   Number of Visits 12   Date for PT Re-Evaluation 03/23/16   Authorization Type Medicare   Authorization Time Period kx 15 visit   PT Start Time 0220   PT Stop Time 0300   PT Time Calculation (min) 40 min   Activity Tolerance Patient tolerated treatment well;Patient limited by pain   Behavior During Therapy [facility] for tasks assessed/performed      Past Medical History  Diagnosis Date  . Pneumonia     hx of-about 10yr ago  . Chronic back pain   . History of colon polyps   . Mental disorder     bipolar  . Diabetes mellitus without complication   . DVT (deep venous thrombosis)   . Peripheral vascular disease     Past Surgical History  Procedure Laterality Date  . Shoulder surgery  70's    right  . Appendectomy      early 979's . Blood clot in groin  early 80's  . Back surgery  05/2011    x 5  . Hernia repair      early 2000's  . Carpal tunnel release      left  . Cervical spine surgery  2009  . Colonoscopy      There were no vitals filed for this visit.  Visit Diagnosis:  Bilateral low back pain with sciatica, sciatica laterality unspecified - Plan: PT plan of care cert/re-cert  Joint stiffness of spine - Plan: PT plan of care cert/re-cert  Stiffness of hip joint, unspecified laterality - Plan: PT plan of care cert/re-cert  Weakness of trunk musculature - Plan: PT plan of care cert/re-cert  Abnormal posture - Plan: PT plan of care cert/re-cert  Unsteadiness on feet - Plan: PT plan of care cert/re-cert      Subjective Assessment - 02/10/16 1427    Subjective Back since > a  year.   Medication for pain from MD, injections. Without benefit.  He has had PT in past. No benefit from PT in past.   Last time without pain he can't remember.    Patient is accompained by: --  caregiver   Pertinent History Lumbar surgeries x7 with 4-5 level of fusion.    Limitations Walking;Sitting;House hold activities;Standing   How long can you sit comfortably? 30 min   How long can you stand comfortably? 20 min   How long can you walk comfortably? Not sure   Diagnostic tests None recently   Patient Stated Goals Releif of pain.    Currently in Pain? Yes   Pain Score 8    Pain Location Back   Pain Orientation Lower;Posterior;Left;Right   Pain Descriptors / Indicators --  BAD   Pain Type Chronic pain   Pain Radiating Towards pain in both anterior thighs   Pain Onset More than a month ago   Pain Frequency Constant   Aggravating Factors  See above   Pain Relieving Factors Nothing   Multiple Pain Sites No            [facility] PT Assessment - 02/10/16 1424    Assessment   Medical Diagnosis herniated  disc lumbar   Referring Provider Avbeure, MD   Onset Date/Surgical Date --  last surgery 2012   Next MD Visit 02/13/16   Prior Therapy Yes without benefit   Precautions   Precautions Fall   Restrictions   Weight Bearing Restrictions No   Balance Screen   Has the patient fallen in the past 6 months Yes   How many times? daily   Has the patient had a decrease in activity level because of a fear of falling?  Yes   Is the patient reluctant to leave their home because of a fear of falling?  No   Home Environment   Living Environment Private residence   Living Arrangements Alone  Some one comes 3 hours per day   Type of Home Apartment   Home Access Level entry   Prior Function   Level of Independence Needs assistance with ADLs;Requires assistive device for independence;Needs assistance with homemaking   Cognition   Overall Cognitive Status Within Functional Limits for tasks  assessed   ROM / Strength   AROM / PROM / Strength AROM;Strength;PROM   AROM   AROM Assessment Site Lumbar   Lumbar Flexion 20   Lumbar Extension 10   Lumbar - Right Side Bend 5   Lumbar - Left Side Bend 5   PROM   Overall PROM Comments Both jhips with adduction tightness to 0 degrees and IR to 10 degree , flexion WNL and hip extension    Strength   Overall Strength Comments WNL both LE    Transfers   Comments slow and akward with increased effort to lift legs                           PT Education - 02/10/16 1504    Education provided Yes   Education Details POC HEP   Person(s) Educated Patient;Caregiver(s)   Methods Explanation;Tactile cues;Verbal cues;Handout   Comprehension Returned demonstration;Verbalized understanding          PT Short Term Goals - 02/10/16 1549    PT SHORT TERM GOAL #1   Title Independent with intial HEP   Time 3   Period Weeks   Status New   PT SHORT TERM GOAL #2   Title He will improve with pain decreased 10-20%    Time 3   Period Weeks   Status New           PT Long Term Goals - 02/10/16 1549    PT LONG TERM GOAL #1   Title He will be independent in all HEP issued as of last visit   Time 6   Period Weeks   Status New   PT LONG TERM GOAL #2   Title He will report pain improved 30-40 % with walking and standing.    Time 6   Period Weeks   Status New   PT LONG TERM GOAL #3   Title He will improve BERG score by 6-8 points after assessed.    Time 6   Period Weeks   Status New               Plan - 02/10/16 1544    Clinical Impression Statement [name] presents with chronic pain in back and anterior thighs. He has decreased ROM in back due to fusions and in both hips. His strength is WNL in both LE but poor in abdominals and  he reports falls adn uses SPc for ambulation.  He wil need a BERG to assess appropriate assistive device. He has been to PT  before with no benefit but we will give him a trial and  if improved at 6-8 visits  will extend.    Pt will benefit from skilled therapeutic intervention in order to improve on the following deficits Decreased range of motion;Difficulty walking;Pain;Increased muscle spasms;Decreased activity tolerance;Decreased balance;Decreased mobility;Decreased strength;Postural dysfunction   Rehab Potential Good   PT Frequency 2x / week   PT Duration 6 weeks   PT Treatment/Interventions Electrical Stimulation;Iontophoresis 79m/ml Dexamethasone;Moist Heat;Functional mobility training;Therapeutic exercise;Balance training;Patient/family education;Manual techniques;Taping;Dry needling;Passive range of motion   PT Next Visit Plan REview HEP and proceed with manual for tone and ROM   PT Home Exercise Plan Stretching, BERG SCORE   Consulted and Agree with Plan of Care Patient          G-Codes - 0March 25, [id number]    Functional Assessment Tool Used FOTO 75% limited   Functional Limitation Mobility: Walking and moving around   Mobility: Walking and Moving Around Current Status [phone] At least 60 percent but less than 80 percent impaired, limited or restricted   Mobility: Walking and Moving Around Goal Status [phone] At least 40 percent but less than 60 percent impaired, limited or restricted       Problem List Patient Active Problem List   Diagnosis Date Noted  . Gait disturbance 04/10/2015  . Numbness 04/10/2015  . Lumbar radicular pain 04/10/2015  . Lumbar spondylosis 04/10/2015  . Bipolar I disorder with depression, severe ([place] 06/30/2014  . Suicidal ideations 06/30/2014  . Ulcer of lower limb, unspecified 02/20/2014  . CELLULITIS AND ABSCESS OF LEG EXCEPT FOOT 11/08/2007    CDarrel HooverPT 32017/03/25 3:55 PM  [facility] [address]Phone: [phone]  Fax:  [phone] Name: BARUSH GATLIFFMRN: [id number] of Birth: 1June 26, 1957 PHYSICAL THERAPY DISCHARGE  SUMMARY  Visits from Start of Care: 1 Current functional level related to goals / functional outcomes: Unknown as he did not return after eval   Remaining deficits: Unknown   Education / Equipment: NA  Plan:                                                    Patient goals were not met. Patient is being discharged due to not returning since the last visit.  ?????    SDarrel Hoover PT             04/17/16                 8:15AM

## 2016-02-17 ENCOUNTER — Encounter: Payer: Medicare Other | Admitting: Physical Therapy

## 2016-02-19 ENCOUNTER — Encounter: Payer: Medicare Other | Admitting: Physical Therapy

## 2016-02-24 ENCOUNTER — Encounter: Payer: Medicare Other | Admitting: Physical Therapy

## 2016-02-26 ENCOUNTER — Encounter: Payer: Medicare Other | Admitting: Physical Therapy

## 2016-03-02 ENCOUNTER — Encounter: Payer: Medicare Other | Admitting: Physical Therapy

## 2016-03-04 ENCOUNTER — Encounter: Payer: Medicare Other | Admitting: Physical Therapy

## 2016-03-13 ENCOUNTER — Telehealth: Payer: Self-pay | Admitting: Neurology

## 2016-03-13 NOTE — Telephone Encounter (Signed)
Pt saw Dr. Felecia Shelling in May of 2016 and was to have NCV/EMG which he never had. Pt saw Dr. Louanne Skye and Dr. Louanne Skye is referring pt back over for the NCV/EMG. Magda Paganini Bridges/pt psych assist called and said pt wants to know if this will change his treatment options. Please call Magda Paganini at [phone]. dg

## 2016-03-16 NOTE — Telephone Encounter (Signed)
LMOM for [name]--pt. has not been seen in almost a yr., so it is not possible at this time to say if tx. options will change--part of this will depend upon emg/ncv results.  Will discuss further at appt.  She does not need to return this call unless she has other questions/fim

## 2016-03-16 NOTE — Telephone Encounter (Signed)
The study can help [place] to determine if he has a polyneuropathy and some of these can be treated.

## 2016-05-27 ENCOUNTER — Ambulatory Visit: Payer: Medicare Other | Admitting: Neurology

## 2016-06-01 ENCOUNTER — Encounter: Payer: Self-pay | Admitting: Neurology

## 2016-06-01 ENCOUNTER — Ambulatory Visit (INDEPENDENT_AMBULATORY_CARE_PROVIDER_SITE_OTHER): Payer: Medicare Other | Admitting: Neurology

## 2016-06-01 VITALS — BP 120/62 | HR 76 | Resp 14 | Ht 73.0 in | Wt 216.5 lb

## 2016-06-01 DIAGNOSIS — M47816 Spondylosis without myelopathy or radiculopathy, lumbar region: Secondary | ICD-10-CM

## 2016-06-01 DIAGNOSIS — R200 Anesthesia of skin: Secondary | ICD-10-CM

## 2016-06-01 DIAGNOSIS — IMO0001 Reserved for inherently not codable concepts without codable children: Secondary | ICD-10-CM

## 2016-06-01 DIAGNOSIS — R251 Tremor, unspecified: Secondary | ICD-10-CM

## 2016-06-01 DIAGNOSIS — M5416 Radiculopathy, lumbar region: Secondary | ICD-10-CM | POA: Diagnosis not present

## 2016-06-01 DIAGNOSIS — R269 Unspecified abnormalities of gait and mobility: Secondary | ICD-10-CM

## 2016-06-01 NOTE — Progress Notes (Signed)
[facility]  PATIENT: [name] DOB: [birth year]  REFERRING DOCTOR OR PCP:  [name] (PCP), referred by [name] (Ortho) SOURCE: Patient, records form Ortho and EMR, images and reports on PACS  _________________________________   HISTORICAL  CHIEF COMPLAINT:  Chief Complaint  Patient presents with  . Back Pain    [name] is here with his [facility] nurse, [name], for follow up. He sts. lbp is worse, radiating down left leg. Sts. gait is also worse--he is ambulatory with a cane today.  Sts. numbness in feet is improved, is intermittent.  Today he also c/o numbness/tingling in his left arm.  Sts. he has some neck pain onset 3 weeks ago, no known injury/fim  . Gait Disturbance  . Numbness in Feet    HISTORY OF PRESENT ILLNESS:  [name] is a 60 year old man with back pain, numbness and poor gait.      Gait:    In 2016, he noted  more difficulty with his gait. According to his nurse, gait is fluctuating.   Some days he is worse and uses a wheelchair.   She notes he will usually note a tremor in the leg as he feels weaker and then falls.   This occurs up to twice a day though some days he does not fall.       His stride is smaller and he has more difficulty turning. The severity of his gait problems will fluctuate some. Around his apartment, heuses a cane on good days and a wheelchair on bad days.    He has nocturia 2-3 times a night and frequency during the day.    He often has hesitancy.   Flomax has helped a bit.    LBP:   He continues to note lower back pain and pain that runs down his legs, more on his left.  There is no association between the severity of his pain and the severity of his gait issues as the day goes on. He has had several lumbar operations as well as multiple injections for pain. He is status post L2-L5 fusion and has had several lumbar spine injections, including radiofrequency ablation to denervate the L5-S1 facet joints.      Numbness;   He notes a little more numbness in his feet than last year.  Numbness is up to the knees on bad days.  I personally reviewed an MRI of the cervical spine and brain dated  05/13/2015 and concur with the results: IMPRESSION: This is an abnormal MRI of the brain without contrast showing moderately severe cortical atrophy with compensatory ventricular enlargement there are extensive T2/flair hyperintense foci consistent with severe chronic small vessel ischemic changes. Demyelination and vasculitis . would be less likely to have this pattern was compared to CT scan dated 09/21/2006, there has been considerable worsening in the extent of atrophy and white matter changes.  IMPRESSION: This is an abnormal MRI of the cervical spine showing:   1. C4-C6 ACDF. This has occurred since the MRI dated 03/16/2009 and there is improvement in the foraminal stenosis noted at that time at these levels. 2. Mild to moderate degenerative changes at C3-C4 and C6-C7 that do not lead to any definite nerve root compression. When compared to the prior study, there has been some progression of the degenerative changes at these levels.   REVIEW OF SYSTEMS: Constitutional: No fevers, chills, sweats, or change in appetite Eyes: No visual changes, double vision, eye pain Ear, nose and throat: No  hearing loss, ear pain, nasal congestion, sore throat Cardiovascular: No chest pain, palpitations Respiratory: No shortness of breath at rest or with exertion.   No wheezes GastrointestinaI: No nausea, vomiting, diarrhea, abdominal pain, fecal incontinence Genitourinary: No dysuria, urinary retention or frequency.  No nocturia. Musculoskeletal: No neck pain, some back pain (was helped by RFA) Integumentary: No rash, pruritus, skin lesions Neurological: as above Psychiatric: ha bipolar and is followed by psychiatry.    Endocrine: No palpitations, diaphoresis, change in appetite, change in weigh or increased  thirst.   Has DM Hematologic/Lymphatic: No anemia, purpura, petechiae. Allergic/Immunologic: No itchy/runny eyes, nasal congestion, recent allergic reactions, rashes  ALLERGIES: Allergies  Allergen Reactions  . Penicillins Hives and Rash    REACTION: HIVES, SEVERE ITCHING    HOME MEDICATIONS:  Current outpatient prescriptions:  .  benztropine (COGENTIN) 1 MG tablet, Take 1.5 mg by mouth daily with breakfast. , Disp: , Rfl:  .  cholecalciferol (VITAMIN D) 1000 units tablet, Take 1,000 Units by mouth daily., Disp: , Rfl:  .  citalopram (CELEXA) 40 MG tablet, Take 40 mg by mouth daily with breakfast. , Disp: , Rfl:  .  divalproex (DEPAKOTE) 250 MG DR tablet, Take 250-500 mg by mouth 2 (two) times daily. Takes 250mg  in the morning and 500mg  in the evening, Disp: , Rfl:  .  gabapentin (NEURONTIN) 100 MG capsule, Take 100 mg by mouth 3 (three) times daily., Disp: , Rfl:  .  metFORMIN (GLUCOPHAGE) 500 MG tablet, Take 500 mg by mouth 2 (two) times daily with a meal., Disp: , Rfl:  .  methocarbamol (ROBAXIN) 500 MG tablet, Take 500 mg by mouth 2 (two) times daily as needed for muscle spasms., Disp: , Rfl:  .  naproxen (NAPROSYN) 500 MG tablet, Take 500 mg by mouth 2 (two) times daily with a meal., Disp: , Rfl:  .  risperidone (RISPERDAL) 4 MG tablet, Take 4 mg by mouth at bedtime. , Disp: , Rfl:  .  sitaGLIPtin (JANUVIA) 100 MG tablet, Take 100 mg by mouth daily with breakfast., Disp: , Rfl:  .  traZODone (DESYREL) 100 MG tablet, Take 150 mg by mouth at bedtime., Disp: , Rfl:  .  buPROPion (WELLBUTRIN SR) 150 MG 12 hr tablet, , Disp: , Rfl:  .  tamsulosin (FLOMAX) 0.4 MG CAPS capsule, , Disp: , Rfl:   PAST MEDICAL HISTORY: Past Medical History  Diagnosis Date  . Pneumonia     hx of-about 38yrs ago  . Chronic back pain   . History of colon polyps   . Mental disorder     bipolar  . Diabetes mellitus without complication ([place])   . DVT (deep venous thrombosis) ([place])   . Peripheral vascular  disease ([place])     PAST SURGICAL HISTORY: Past Surgical History  Procedure Laterality Date  . Shoulder surgery  70's    right  . Appendectomy      early 85's  . Blood clot in groin  early 80's  . Back surgery  05/2011    x 5  . Hernia repair      early 2000's  . Carpal tunnel release      left  . Cervical spine surgery  2009  . Colonoscopy      FAMILY HISTORY: Family History  Problem Relation Age of Onset  . Anesthesia problems Neg Hx   . Hypotension Neg Hx   . Malignant hyperthermia Neg Hx   . Pseudochol deficiency Neg Hx   . Heart  attack Father     SOCIAL HISTORY:  Social History   Social History  . Marital Status: Unknown    Spouse Name: N/A  . Number of Children: N/A  . Years of Education: N/A   Occupational History  . Not on file.   Social History Main Topics  . Smoking status: Current Every Day Smoker -- 1.00 packs/day for 40 years    Types: Cigarettes  . Smokeless tobacco: Never Used     Comment: pt states that he is ready to quit smoking but thinks he will need patches advised him to speak with his PCP which he is seaing this Thursday 3/26  . Alcohol Use: No  . Drug Use: No  . Sexual Activity: Yes   Other Topics Concern  . Not on file   Social History Narrative     PHYSICAL EXAM  Filed Vitals:   06/01/16 1041  BP: 120/62  Pulse: 76  Resp: 14  Height: 6\' 1"  (1.854 m)  Weight: 216 lb 8 oz (98.204 kg)    Body mass index is 28.57 kg/(m^2).   General: The patient is well-developed and well-nourished and in no acute distress  Neck:   The neck is supple with slightly reduced ROM and is Mildly tender.    Skin: Extremities are without rash or edema.  Musculoskeletal:  Back is mildly tender with reduced ROM  Neurologic Exam  Mental status: The patient is alert and oriented x 3 at the time of the examination. The patient has apparent normal recent and remote memory, with an apparently normal attention span and concentration ability.    Speech is normal.  Cranial nerves: Extraocular movements are full.  Facial strength is normal.  Trapezius and sternocleidomastoid strength is normal. No dysarthria is noted.  The tongue is midline, and the patient has symmetric elevation of the soft palate. No obvious hearing deficits are noted.  Motor:  Muscle bulk is normal.   Tone is normal. Strength is  5 / 5 in all 4 extremities.   Sensory: Sensory testing is intact to pinprick, soft touch and vibration sensation in arms and proximal leg but decreased vibration and temperature sensation in his toes/feet..  Coordination: Cerebellar testing reveals good finger-nose-finger and heel-to-shin bilaterally.  Gait and station: Station is mildly wide.   Gait is wide with mildly short stride and he does a 4 step 180 degree turn.   He has retropulsion.   He cannot tandem walk.   Romberg is negative.   Reflexes: Deep tendon reflexes are symmetric and normal bilaterally.   Plantar responses are normal.        DIAGNOSTIC DATA (LABS, IMAGING, TESTING) - I reviewed patient records, labs, notes, testing and imaging myself where available.  Lab Results  Component Value Date   WBC 8.6 06/29/2014   HGB 12.6* 06/29/2014   HCT 35.6* 06/29/2014   MCV 93.7 06/29/2014   PLT 195 06/29/2014      Component Value Date/Time   NA 142 06/29/2014 1755   K 4.1 06/29/2014 1755   CL 104 06/29/2014 1755   CO2 25 06/29/2014 1755   GLUCOSE 211* 06/29/2014 1755   BUN 7 06/29/2014 1755   CREATININE 0.78 06/29/2014 1755   CALCIUM 10.0 06/29/2014 1755   PROT 7.5 04/10/2015 1411   PROT 8.2 06/29/2014 1755   ALBUMIN 3.9 06/29/2014 1755   AST 16 06/29/2014 1755   ALT 10 06/29/2014 1755   ALKPHOS 69 06/29/2014 1755   BILITOT 0.3 06/29/2014 1755  GFRNONAA >90 06/29/2014 1755   GFRAA >90 06/29/2014 1755   Lab Results  Component Value Date   CHOL 177 06/15/2014   HDL 25* 06/15/2014   LDLCALC 115* 06/15/2014   TRIG 184 06/15/2014   Lab Results  Component  Value Date   HGBA1C 6.5* 06/15/2014        ASSESSMENT AND PLAN  Lumbar spondylosis, unspecified spinal osteoarthritis  Gait disturbance  Lumbar radicular pain  Numbness   1.  NCV/EMG worse arm and leg to r/o polyneuropathy  (he has DM) 2.  EEG to assess for possibility of seizure.    3.   He will return to see me for study and they will call sooner if there are new or significant worsening in terms.  40 minutes face-to-face evaluation with greater than one half of the time counseling and coordinating care about his worsening and numbness.  Lace Chenevert A. Felecia Shelling, MD, PhD [id number], [id number] AM Certified in Neurology, Clinical Neurophysiology, Sleep Medicine, Pain Medicine and Neuroimaging  [facility] [address], [place], [place] [address] [phone]

## 2016-06-18 ENCOUNTER — Emergency Department (HOSPITAL_COMMUNITY)
Admission: EM | Admit: 2016-06-18 | Discharge: 2016-06-18 | Disposition: A | Discharge status: Home / Self Care | Payer: Medicare Other | Attending: Emergency Medicine | Admitting: Emergency Medicine

## 2016-06-18 ENCOUNTER — Emergency Department (HOSPITAL_COMMUNITY): Payer: Medicare Other

## 2016-06-18 ENCOUNTER — Encounter (HOSPITAL_COMMUNITY): Payer: Self-pay

## 2016-06-18 DIAGNOSIS — Y929 Unspecified place or not applicable: Secondary | ICD-10-CM | POA: Insufficient documentation

## 2016-06-18 DIAGNOSIS — Z7984 Long term (current) use of oral hypoglycemic drugs: Secondary | ICD-10-CM | POA: Insufficient documentation

## 2016-06-18 DIAGNOSIS — R791 Abnormal coagulation profile: Secondary | ICD-10-CM | POA: Insufficient documentation

## 2016-06-18 DIAGNOSIS — W010XXA Fall on same level from slipping, tripping and stumbling without subsequent striking against object, initial encounter: Secondary | ICD-10-CM | POA: Insufficient documentation

## 2016-06-18 DIAGNOSIS — W19XXXA Unspecified fall, initial encounter: Secondary | ICD-10-CM

## 2016-06-18 DIAGNOSIS — R531 Weakness: Secondary | ICD-10-CM | POA: Diagnosis present

## 2016-06-18 DIAGNOSIS — Z8679 Personal history of other diseases of the circulatory system: Secondary | ICD-10-CM | POA: Insufficient documentation

## 2016-06-18 DIAGNOSIS — R269 Unspecified abnormalities of gait and mobility: Secondary | ICD-10-CM

## 2016-06-18 DIAGNOSIS — Z79899 Other long term (current) drug therapy: Secondary | ICD-10-CM | POA: Insufficient documentation

## 2016-06-18 DIAGNOSIS — F17210 Nicotine dependence, cigarettes, uncomplicated: Secondary | ICD-10-CM | POA: Insufficient documentation

## 2016-06-18 DIAGNOSIS — R2689 Other abnormalities of gait and mobility: Secondary | ICD-10-CM | POA: Diagnosis not present

## 2016-06-18 DIAGNOSIS — Y999 Unspecified external cause status: Secondary | ICD-10-CM | POA: Insufficient documentation

## 2016-06-18 DIAGNOSIS — Z86718 Personal history of other venous thrombosis and embolism: Secondary | ICD-10-CM | POA: Diagnosis not present

## 2016-06-18 DIAGNOSIS — Y939 Activity, unspecified: Secondary | ICD-10-CM | POA: Insufficient documentation

## 2016-06-18 LAB — COMPREHENSIVE METABOLIC PANEL
ALBUMIN: 4.5 g/dL (ref 3.5–5.0)
ALK PHOS: 69 U/L (ref 38–126)
ALT: 21 U/L (ref 17–63)
ANION GAP: 9 (ref 5–15)
AST: 59 U/L — AB (ref 15–41)
BILIRUBIN TOTAL: 0.7 mg/dL (ref 0.3–1.2)
BUN: 12 mg/dL (ref 6–20)
CALCIUM: 9.8 mg/dL (ref 8.9–10.3)
CO2: 24 mmol/L (ref 22–32)
CREATININE: 0.99 mg/dL (ref 0.61–1.24)
Chloride: 107 mmol/L (ref 101–111)
GFR calc Af Amer: >60 mL/min (ref >60)
GFR calc non Af Amer: >60 mL/min (ref >60)
GLUCOSE: 93 mg/dL (ref 65–99)
Potassium: 3.9 mmol/L (ref 3.5–5.1)
SODIUM: 140 mmol/L (ref 135–145)
TOTAL PROTEIN: 8 g/dL (ref 6.5–8.1)

## 2016-06-18 LAB — DIFFERENTIAL
Basophils Absolute: 0 10*3/uL (ref 0.0–0.1)
Basophils Relative: 0 %
EOS PCT: 2 %
Eosinophils Absolute: 0.2 10*3/uL (ref 0.0–0.7)
LYMPHS ABS: 3.3 10*3/uL (ref 0.7–4.0)
LYMPHS PCT: 33 %
Monocytes Absolute: 0.7 10*3/uL (ref 0.1–1.0)
Monocytes Relative: 7 %
NEUTROS ABS: 5.8 10*3/uL (ref 1.7–7.7)
NEUTROS PCT: 58 %

## 2016-06-18 LAB — I-STAT TROPONIN, ED: Troponin i, poc: 0 ng/mL (ref 0.00–0.08)

## 2016-06-18 LAB — VALPROIC ACID LEVEL: VALPROIC ACID LVL: 86 ug/mL (ref 50.0–100.0)

## 2016-06-18 LAB — APTT: aPTT: 28 seconds (ref 24–37)

## 2016-06-18 LAB — PROTIME-INR
INR: 1.12 (ref 0.00–1.49)
PROTHROMBIN TIME: 14.6 s (ref 11.6–15.2)

## 2016-06-18 LAB — CBC
HCT: 35.7 % — ABNORMAL LOW (ref 39.0–52.0)
HEMOGLOBIN: 12.3 g/dL — AB (ref 13.0–17.0)
MCH: 31.8 pg (ref 26.0–34.0)
MCHC: 34.5 g/dL (ref 30.0–36.0)
MCV: 92.2 fL (ref 78.0–100.0)
PLATELETS: 171 10*3/uL (ref 150–400)
RBC: 3.87 MIL/uL — AB (ref 4.22–5.81)
RDW: 12.5 % (ref 11.5–15.5)
WBC: 10 10*3/uL (ref 4.0–10.5)

## 2016-06-18 LAB — ETHANOL: Alcohol, Ethyl (B): <5 mg/dL (ref <5)

## 2016-06-18 NOTE — ED Notes (Signed)
[facility] called for transport.  

## 2016-06-18 NOTE — Discharge Instructions (Signed)
Fall Prevention in the Home   Falls can cause injuries. They can happen to people of all ages. There are many things you can do to make your home safe and to help prevent falls.   WHAT CAN I DO ON THE OUTSIDE OF MY HOME?  · Regularly fix the edges of walkways and driveways and fix any cracks.  · Remove anything that might make you trip as you walk through a door, such as a raised step or threshold.  · Trim any bushes or trees on the path to your home.  · Use bright outdoor lighting.  · Clear any walking paths of anything that might make someone trip, such as rocks or tools.  · Regularly check to see if handrails are loose or broken. Make sure that both sides of any steps have handrails.  · Any raised decks and porches should have guardrails on the edges.  · Have any leaves, snow, or ice cleared regularly.  · Use sand or salt on walking paths during winter.  · Clean up any spills in your garage right away. This includes oil or grease spills.  WHAT CAN I DO IN THE BATHROOM?   · Use night lights.  · Install grab bars by the toilet and in the tub and shower. Do not use towel bars as grab bars.  · Use non-skid mats or decals in the tub or shower.  · If you need to sit down in the shower, use a plastic, non-slip stool.  · Keep the floor dry. Clean up any water that spills on the floor as soon as it happens.  · Remove soap buildup in the tub or shower regularly.  · Attach bath mats securely with double-sided non-slip rug tape.  · Do not have throw rugs and other things on the floor that can make you trip.  WHAT CAN I DO IN THE BEDROOM?  · Use night lights.  · Make sure that you have a light by your bed that is easy to reach.  · Do not use any sheets or blankets that are too big for your bed. They should not hang down onto the floor.  · Have a firm chair that has side arms. You can use this for support while you get dressed.  · Do not have throw rugs and other things on the floor that can make you trip.  WHAT CAN I DO IN  THE KITCHEN?  · Clean up any spills right away.  · Avoid walking on wet floors.  · Keep items that you use a lot in easy-to-reach places.  · If you need to reach something above you, use a strong step stool that has a grab bar.  · Keep electrical cords out of the way.  · Do not use floor polish or wax that makes floors slippery. If you must use wax, use non-skid floor wax.  · Do not have throw rugs and other things on the floor that can make you trip.  WHAT CAN I DO WITH MY STAIRS?  · Do not leave any items on the stairs.  · Make sure that there are handrails on both sides of the stairs and use them. Fix handrails that are broken or loose. Make sure that handrails are as long as the stairways.  · Check any carpeting to make sure that it is firmly attached to the stairs. Fix any carpet that is loose or worn.  · Avoid having throw rugs at the top   or bottom of the stairs. If you do have throw rugs, attach them to the floor with carpet tape.  · Make sure that you have a light switch at the top of the stairs and the bottom of the stairs. If you do not have them, ask someone to add them for you.  WHAT ELSE CAN I DO TO HELP PREVENT FALLS?  · Wear shoes that:    Do not have high heels.    Have rubber bottoms.    Are comfortable and fit you well.    Are closed at the toe. Do not wear sandals.  · If you use a stepladder:    Make sure that it is fully opened. Do not climb a closed stepladder.    Make sure that both sides of the stepladder are locked into place.    Ask someone to hold it for you, if possible.  · Clearly mark and make sure that you can see:    Any grab bars or handrails.    First and last steps.    Where the edge of each step is.  · Use tools that help you move around (mobility aids) if they are needed. These include:    Canes.    Walkers.    Scooters.    Crutches.  · Turn on the lights when you go into a dark area. Replace any light bulbs as soon as they burn out.  · Set up your furniture so you have a clear  path. Avoid moving your furniture around.  · If any of your floors are uneven, fix them.  · If there are any pets around you, be aware of where they are.  · Review your medicines with your doctor. Some medicines can make you feel dizzy. This can increase your chance of falling.  Ask your doctor what other things that you can do to help prevent falls.     This information is not intended to replace advice given to you by your health care provider. Make sure you discuss any questions you have with your health care provider.     Document Released: 09/12/2009 Document Revised: 04/02/2015 Document Reviewed: 12/21/2014  Elsevier Interactive Patient Education ©2016 [employer]

## 2016-06-18 NOTE — ED Notes (Signed)
Patient aware that a urine sample is needed. Urinal is at the bedside.  

## 2016-06-18 NOTE — ED Notes (Signed)
Cipriano Bunker, RN/[facility]   [phone]

## 2016-06-18 NOTE — Progress Notes (Signed)
EDCM went to speak to patient at bedside, however, patient wanting to use urinal.

## 2016-06-18 NOTE — ED Provider Notes (Signed)
CSN: [id number]     Arrival date & time 06/18/16  1544 History   First MD Initiated Contact with Patient 06/18/16 1710     Chief Complaint  Patient presents with  . Fall   Pt is a poor historian.  Difficult to obtain details  HPI Patient presents to the emergency room for evaluation of weakness and falls. Patient states he has been using a wheelchair and a walker for the last couple of months. Patient cannot tell me why she is using a wheelchair and walker. When I asked him what was the trouble that made him use a wheelchair he just answers because he can't walk.  Patient says that today he fell 3 times.  When I asked him if he was able to walk without difficulty after he first fell the patient stated that he was never able to walk after fell. I asked him how he was able to fall 2 more times if he was never able to get up and the patient answered that he was able to get up.  Patient's states that his legs feel weak. When he goes to walk his legs shuffle and this causes him to trip and fall. He denies any trouble with headache. No extremity weakness.  No fevers or chills. No chest pain or shortness of breath. He complains of pain in his back. He denied any hip pain to me.  Past Medical History  Diagnosis Date  . Pneumonia     hx of-about 33yrs ago  . Chronic back pain   . History of colon polyps   . Mental disorder     bipolar  . Diabetes mellitus without complication ([place])   . DVT (deep venous thrombosis) ([place])   . Peripheral vascular disease [facility])    Past Surgical History  Procedure Laterality Date  . Shoulder surgery  70's    right  . Appendectomy      early 27's  . Blood clot in groin  early 80's  . Back surgery  05/2011    x 5  . Hernia repair      early 2000's  . Carpal tunnel release      left  . Cervical spine surgery  2009  . Colonoscopy     Family History  Problem Relation Age of Onset  . Anesthesia problems Neg Hx   . Hypotension Neg Hx   . Malignant hyperthermia Neg  Hx   . Pseudochol deficiency Neg Hx   . Heart attack Father    Social History  Substance Use Topics  . Smoking status: Current Every Day Smoker -- 1.00 packs/day for 40 years    Types: Cigarettes  . Smokeless tobacco: Never Used     Comment: pt states that he is ready to quit smoking but thinks he will need patches advised him to speak with his PCP which he is seaing this Thursday 3/26  . Alcohol Use: No    Review of Systems  All other systems reviewed and are negative.     Allergies  Penicillins  Home Medications   Prior to Admission medications   Medication Sig Start Date End Date Taking? Authorizing Provider  benztropine (COGENTIN) 2 MG tablet Take 2 mg by mouth daily. 06/16/16  Yes Historical Provider, MD  buPROPion (WELLBUTRIN SR) 150 MG 12 hr tablet Take 150 mg by mouth daily.  05/29/16  Yes Historical Provider, MD  cholecalciferol (VITAMIN D) 1000 units tablet Take 1,000 Units by mouth daily.   Yes  Historical Provider, MD  citalopram (CELEXA) 20 MG tablet Take 20 mg by mouth daily.   Yes Historical Provider, MD  citalopram (CELEXA) 40 MG tablet Take 40 mg by mouth daily with breakfast.    Yes Historical Provider, MD  divalproex (DEPAKOTE ER) 500 MG 24 hr tablet Take 250-500 mg by mouth 2 (two) times daily. Takes 250mg  in the morning and 500mg  in the evening 05/29/16  Yes Historical Provider, MD  gabapentin (NEURONTIN) 300 MG capsule Take 300 mg by mouth 3 (three) times daily.   Yes Historical Provider, MD  metFORMIN (GLUCOPHAGE) 500 MG tablet Take 500 mg by mouth 2 (two) times daily with a meal.   Yes Historical Provider, MD  methocarbamol (ROBAXIN) 750 MG tablet Take 1 tablet by mouth 3 (three) times daily. 05/29/16  Yes Historical Provider, MD  naproxen (NAPROSYN) 500 MG tablet Take 500 mg by mouth 2 (two) times daily as needed for mild pain.    Yes Historical Provider, MD  risperidone (RISPERDAL) 4 MG tablet Take 4 mg by mouth 2 (two) times daily.    Yes Historical Provider,  MD  sitaGLIPtin (JANUVIA) 100 MG tablet Take 100 mg by mouth daily with breakfast.   Yes Historical Provider, MD  tamsulosin (FLOMAX) 0.4 MG CAPS capsule Take 0.4 mg by mouth daily.  04/08/16  Yes Historical Provider, MD  traZODone (DESYREL) 100 MG tablet Take 200 mg by mouth at bedtime.    Yes Historical Provider, MD   BP 168/79 mmHg  Pulse 70  Temp(Src) 98.2 F (36.8 C) (Oral)  Resp 12  SpO2 95% Physical Exam  Constitutional: He appears well-developed and well-nourished. No distress.  HENT:  Head: Normocephalic and atraumatic.  Right Ear: External ear normal.  Left Ear: External ear normal.  Eyes: Conjunctivae are normal. Right eye exhibits no discharge. Left eye exhibits no discharge. No scleral icterus.  Neck: Neck supple. No tracheal deviation present.  Cardiovascular: Normal rate, regular rhythm and intact distal pulses.   Pulmonary/Chest: Effort normal and breath sounds normal. No stridor. No respiratory distress. He has no wheezes. He has no rales.  Abdominal: Soft. Bowel sounds are normal. He exhibits no distension. There is no tenderness. There is no rebound and no guarding.  Musculoskeletal: He exhibits edema (trace edema bilateral lower extrem). He exhibits no tenderness.  Neurological: He is alert. No cranial nerve deficit (no facial droop, extraocular movements intact, no slurred speech) or sensory deficit. He exhibits normal muscle tone. He displays no seizure activity. Coordination normal.  Difficulty lifting both legs off the bed, able to plantar flex bilaterally, sensation intact bilaterally, normal grip strength bilaterally  Skin: Skin is warm and dry. No rash noted. He is not diaphoretic.  Psychiatric: His affect is blunt. He is slowed. He expresses no homicidal and no suicidal ideation.  Nursing note and vitals reviewed.   ED Course  Procedures (including critical care time) Labs Review Labs Reviewed  CBC - Abnormal; Notable for the following:    RBC 3.87 (*)     Hemoglobin 12.3 (*)    HCT 35.7 (*)    All other components within normal limits  COMPREHENSIVE METABOLIC PANEL - Abnormal; Notable for the following:    AST 59 (*)    All other components within normal limits  ETHANOL  PROTIME-INR  APTT  DIFFERENTIAL  VALPROIC ACID LEVEL  URINE RAPID DRUG SCREEN, HOSP PERFORMED  URINALYSIS, ROUTINE W REFLEX MICROSCOPIC (NOT AT [facility])  Randolm Idol, ED    Imaging Review Dg  Lumbar Spine Complete  06/18/2016  CLINICAL DATA:  Fall today.  Low back pain.  Initial encounter. EXAM: LUMBAR SPINE - COMPLETE 4+ VIEW COMPARISON:  05/18/2016 FINDINGS: There is no evidence of lumbar spine fracture. Alignment is normal. Posterior lumbar fusion hardware again seen from levels of L2-L5, with interbody cage at L4-5. Other disc spaces are maintained. Left-sided facet DJD noted at L5-S1. Aortic atherosclerosis noted. IMPRESSION: No acute findings. Stable appearance of lumbar spine fusion. Left L5-S1 facet DJD noted. Aortic atherosclerosis noted. Electronically Signed   By: Earle Gell M.D.   On: 06/18/2016 18:34   Ct Cervical Spine Wo Contrast  06/18/2016  CLINICAL DATA:  60 y/o M; patient found lying on the floor, denies loss of consciousness for head injury. EXAM: CT HEAD WITHOUT CONTRAST CT CERVICAL SPINE WITHOUT CONTRAST TECHNIQUE: Multidetector CT imaging of the head and cervical spine was performed following the standard protocol without intravenous contrast. Multiplanar CT image reconstructions of the cervical spine were also generated. COMPARISON:  CT head dated 09/21/2006. MRI of brain dated 05/13/2015. FINDINGS: CT HEAD FINDINGS No evidence of large acute infarct, mass effect, or intracranial hemorrhage. Moderate diffuse volume loss. Nonspecific hypoattenuation in subcortical and periventricular white matter corresponding to T2 FLAIR hyperintense signal abnormality on the prior MRI probably represents chronic microvascular ischemic changes. No extra-axial  collection is identified. Ventricles are moderately enlarged borderline for degree of volume loss and increased from 2007. No high attenuation vessel. No displaced skull fracture. Visualized paranasal sinuses and mastoids are clear. Orbits are unremarkable. Extensive calcific atherosclerosis of cavernous internal carotid arteries. CT CERVICAL SPINE FINDINGS Alignment: Straightening of cervical lordosis is likely positional. No listhesis. Bones: Post anterior cervical discectomy and fusion of Celsius 4 through C6. Hardware appears intact. No para hardware related complication. There are endplate degenerative changes and disc space narrowing at C6 through T1. There is mild multilevel facet arthrosis. There is no high-grade bony canal stenosis or foraminal narrowing. Atlantoaxial and atlantooccipital articulations are maintained. No acute fracture is identified. Soft tissues: Biapical scarring of the lungs. Normal thyroid gland. No lymphadenopathy. No prevertebral soft tissue swelling. Visualized pharynx, larynx, and subglottic airway are normal. IMPRESSION: 1. No evidence of large acute infarct, mass effect, or intracranial hemorrhage. 2. Moderate interval increase in size of lateral ventricles in 2007, borderline for degree of volume loss, may represent hydrocephalus. 3. No acute fracture or dislocation of the cervical spine. These results were called by telephone at the time of interpretation on 06/18/2016 at 6:11 pm to Dr. Dorie Rank , who verbally acknowledged these results. Electronically Signed   By: Kristine Garbe M.D.   On: 06/18/2016 18:16   Dg Hip Unilat With Pelvis 2-3 Views Left  06/18/2016  CLINICAL DATA:  Fall on lying on floor as patient reports 3 false today. Low back and left hip pain. EXAM: DG HIP (WITH OR WITHOUT PELVIS) 2-3V LEFT COMPARISON:  None. FINDINGS: Examination demonstrates minimal symmetric degenerative change of the hips. No evidence of acute fracture or dislocation. Fusion  hardware partially visualized over the lower lumbar spine. IMPRESSION: No acute findings. Electronically Signed   By: Marin Olp M.D.   On: 06/18/2016 18:36   Ct Head Code Stroke W/o Cm  06/18/2016  CLINICAL DATA:  60 y/o M; patient found lying on the floor, denies loss of consciousness for head injury. EXAM: CT HEAD WITHOUT CONTRAST CT CERVICAL SPINE WITHOUT CONTRAST TECHNIQUE: Multidetector CT imaging of the head and cervical spine was performed following the standard protocol without  intravenous contrast. Multiplanar CT image reconstructions of the cervical spine were also generated. COMPARISON:  CT head dated 09/21/2006. MRI of brain dated 05/13/2015. FINDINGS: CT HEAD FINDINGS No evidence of large acute infarct, mass effect, or intracranial hemorrhage. Moderate diffuse volume loss. Nonspecific hypoattenuation in subcortical and periventricular white matter corresponding to T2 FLAIR hyperintense signal abnormality on the prior MRI probably represents chronic microvascular ischemic changes. No extra-axial collection is identified. Ventricles are moderately enlarged borderline for degree of volume loss and increased from 2007. No high attenuation vessel. No displaced skull fracture. Visualized paranasal sinuses and mastoids are clear. Orbits are unremarkable. Extensive calcific atherosclerosis of cavernous internal carotid arteries. CT CERVICAL SPINE FINDINGS Alignment: Straightening of cervical lordosis is likely positional. No listhesis. Bones: Post anterior cervical discectomy and fusion of Celsius 4 through C6. Hardware appears intact. No para hardware related complication. There are endplate degenerative changes and disc space narrowing at C6 through T1. There is mild multilevel facet arthrosis. There is no high-grade bony canal stenosis or foraminal narrowing. Atlantoaxial and atlantooccipital articulations are maintained. No acute fracture is identified. Soft tissues: Biapical scarring of the lungs.  Normal thyroid gland. No lymphadenopathy. No prevertebral soft tissue swelling. Visualized pharynx, larynx, and subglottic airway are normal. IMPRESSION: 1. No evidence of large acute infarct, mass effect, or intracranial hemorrhage. 2. Moderate interval increase in size of lateral ventricles in 2007, borderline for degree of volume loss, may represent hydrocephalus. 3. No acute fracture or dislocation of the cervical spine. These results were called by telephone at the time of interpretation on 06/18/2016 at 6:11 pm to Dr. Dorie Rank , who verbally acknowledged these results. Electronically Signed   By: Kristine Garbe M.D.   On: 06/18/2016 18:16   I have personally reviewed and evaluated these images and lab results as part of my medical decision-making.   EKG Interpretation   Date/Time:  Thursday June 18 2016 18:44:31 EDT Ventricular Rate:  71 PR Interval:    QRS Duration: 85 QT Interval:  415 QTC Calculation: 451 R Axis:   88 Text Interpretation:  Sinus rhythm No significant change since last  tracing Confirmed by Martiza Speth  MD-J, Agness Sibrian [id number]) on 06/18/2016 6:53:25 PM      MDM   Final diagnoses:  Fall, initial encounter  Gait disturbance    Reviewed recent medical records.  Pt saw a neurologist on 7/3 for these symptoms.  Sx seem to be not significantly changed.  Pt was able to get up and walk with assistance although he felt weak..  No acute injuries noted from the fall.  No acute medical issues. Pt does have a wheelchair to use at home.  Encouraged him to use his wheelchair.  Call his neurologist tomorrow to arrange follow up.    Dorie Rank, MD 06/18/16 2050

## 2016-06-18 NOTE — ED Notes (Signed)
Pt had difficulty ambulating. Pt stated that he felt weak while trying to ambulate.

## 2016-06-18 NOTE — ED Notes (Signed)
Pt presents via EMS from home. Pt was supposed to have an appointment with [facility] today, did not show up and when they went to his home, they found him laying on the floor. Uses walker and wheelchair at baseline. Pt reports that he has fallen 3 times today. Pt reports he was using his walker, tripped and fell, did not hit his head, denies LOC. Pt is not on blood thinners. Pt reports he landed on his left side, c/o 10/10 left hip pain, no shortening or deformity noted. Pt has not been ambulatory since the fall. Pt is immobilized in a c-collar at this time.

## 2016-06-19 ENCOUNTER — Telehealth: Payer: Self-pay | Admitting: General Practice

## 2016-06-23 ENCOUNTER — Ambulatory Visit (INDEPENDENT_AMBULATORY_CARE_PROVIDER_SITE_OTHER): Payer: Medicare Other | Admitting: Neurology

## 2016-06-23 DIAGNOSIS — IMO0001 Reserved for inherently not codable concepts without codable children: Secondary | ICD-10-CM

## 2016-06-23 DIAGNOSIS — R251 Tremor, unspecified: Secondary | ICD-10-CM

## 2016-06-27 ENCOUNTER — Encounter (HOSPITAL_COMMUNITY): Payer: Self-pay | Admitting: Emergency Medicine

## 2016-06-27 ENCOUNTER — Emergency Department (HOSPITAL_COMMUNITY): Payer: Medicare Other

## 2016-06-27 ENCOUNTER — Emergency Department (HOSPITAL_COMMUNITY)
Admission: EM | Admit: 2016-06-27 | Discharge: 2016-06-27 | Disposition: A | Discharge status: Home / Self Care | Payer: Medicare Other | Attending: Emergency Medicine | Admitting: Emergency Medicine

## 2016-06-27 DIAGNOSIS — G8929 Other chronic pain: Secondary | ICD-10-CM | POA: Diagnosis not present

## 2016-06-27 DIAGNOSIS — R269 Unspecified abnormalities of gait and mobility: Secondary | ICD-10-CM | POA: Insufficient documentation

## 2016-06-27 DIAGNOSIS — E119 Type 2 diabetes mellitus without complications: Secondary | ICD-10-CM | POA: Insufficient documentation

## 2016-06-27 DIAGNOSIS — Z79899 Other long term (current) drug therapy: Secondary | ICD-10-CM | POA: Diagnosis not present

## 2016-06-27 DIAGNOSIS — R5383 Other fatigue: Secondary | ICD-10-CM | POA: Insufficient documentation

## 2016-06-27 DIAGNOSIS — R531 Weakness: Secondary | ICD-10-CM | POA: Insufficient documentation

## 2016-06-27 DIAGNOSIS — F17210 Nicotine dependence, cigarettes, uncomplicated: Secondary | ICD-10-CM | POA: Insufficient documentation

## 2016-06-27 DIAGNOSIS — Z7984 Long term (current) use of oral hypoglycemic drugs: Secondary | ICD-10-CM | POA: Diagnosis not present

## 2016-06-27 DIAGNOSIS — M549 Dorsalgia, unspecified: Secondary | ICD-10-CM | POA: Diagnosis not present

## 2016-06-27 DIAGNOSIS — I739 Peripheral vascular disease, unspecified: Secondary | ICD-10-CM | POA: Insufficient documentation

## 2016-06-27 LAB — COMPREHENSIVE METABOLIC PANEL
ALK PHOS: 64 U/L (ref 38–126)
ALT: 20 U/L (ref 17–63)
ANION GAP: 7 (ref 5–15)
AST: 27 U/L (ref 15–41)
Albumin: 4.1 g/dL (ref 3.5–5.0)
BILIRUBIN TOTAL: 0.9 mg/dL (ref 0.3–1.2)
BUN: 9 mg/dL (ref 6–20)
CALCIUM: 9.2 mg/dL (ref 8.9–10.3)
CO2: 28 mmol/L (ref 22–32)
Chloride: 106 mmol/L (ref 101–111)
Creatinine, Ser: 0.9 mg/dL (ref 0.61–1.24)
GLUCOSE: 76 mg/dL (ref 65–99)
Potassium: 3.8 mmol/L (ref 3.5–5.1)
Sodium: 141 mmol/L (ref 135–145)
TOTAL PROTEIN: 7.3 g/dL (ref 6.5–8.1)

## 2016-06-27 LAB — CBC WITH DIFFERENTIAL/PLATELET
Basophils Absolute: 0 10*3/uL (ref 0.0–0.1)
Basophils Relative: 0 %
Eosinophils Absolute: 0.2 10*3/uL (ref 0.0–0.7)
Eosinophils Relative: 2 %
HEMATOCRIT: 35.5 % — AB (ref 39.0–52.0)
HEMOGLOBIN: 12 g/dL — AB (ref 13.0–17.0)
LYMPHS ABS: 3.1 10*3/uL (ref 0.7–4.0)
Lymphocytes Relative: 38 %
MCH: 31.5 pg (ref 26.0–34.0)
MCHC: 33.8 g/dL (ref 30.0–36.0)
MCV: 93.2 fL (ref 78.0–100.0)
MONOS PCT: 8 %
Monocytes Absolute: 0.6 10*3/uL (ref 0.1–1.0)
NEUTROS ABS: 4.2 10*3/uL (ref 1.7–7.7)
NEUTROS PCT: 52 %
Platelets: 158 10*3/uL (ref 150–400)
RBC: 3.81 MIL/uL — ABNORMAL LOW (ref 4.22–5.81)
RDW: 12.3 % (ref 11.5–15.5)
WBC: 8.2 10*3/uL (ref 4.0–10.5)

## 2016-06-27 LAB — URINALYSIS, ROUTINE W REFLEX MICROSCOPIC
Bilirubin Urine: NEGATIVE
Glucose, UA: NEGATIVE mg/dL
HGB URINE DIPSTICK: NEGATIVE
Ketones, ur: NEGATIVE mg/dL
LEUKOCYTES UA: NEGATIVE
NITRITE: NEGATIVE
PROTEIN: NEGATIVE mg/dL
SPECIFIC GRAVITY, URINE: 1.01 (ref 1.005–1.030)
pH: 6 (ref 5.0–8.0)

## 2016-06-27 LAB — RAPID URINE DRUG SCREEN, HOSP PERFORMED
AMPHETAMINES: NOT DETECTED
BENZODIAZEPINES: NOT DETECTED
Barbiturates: NOT DETECTED
COCAINE: NOT DETECTED
OPIATES: NOT DETECTED
TETRAHYDROCANNABINOL: NOT DETECTED

## 2016-06-27 LAB — ETHANOL: Alcohol, Ethyl (B): <5 mg/dL (ref <5)

## 2016-06-27 LAB — AMMONIA: Ammonia: 14 umol/L (ref 9–35)

## 2016-06-27 LAB — I-STAT CG4 LACTIC ACID, ED: LACTIC ACID, VENOUS: 1.3 mmol/L (ref 0.5–1.9)

## 2016-06-27 NOTE — ED Provider Notes (Signed)
[name] DEPT Provider Note   CSN: [id number] Arrival date & time: 06/27/16  1242  First Provider Contact:  First MD Initiated Contact with Patient 06/27/16 1322        History   Chief Complaint Chief Complaint  Patient presents with  . Fatigue    HPI [name] is a 60 y.o. male.  59 year old male with history of chronic back pain, diabetes mellitus, bipolar disorder, peripheral vascular disease presents for vague complaints.  The patient's was brought in from home by EMS. Reportedly EMS was called his caregiver Lattie Haw. She told EMS that the patient seemed lethargic today. The patient reports he has been more fatigued than usual. He said otherwise he feels fine. He denies chest pain, shortness of breath, cough, fever, changes in urinary patterns. Has a history of chronic back pain but denies any increased back pain. He said he is unsure why Lattie Haw called EMS.    Past Medical History:  Diagnosis Date  . Chronic back pain   . Diabetes mellitus without complication ([place])   . DVT (deep venous thrombosis) ([place])   . History of colon polyps   . Mental disorder    bipolar  . Peripheral vascular disease ([place])   . Pneumonia    hx of-about 27yrs ago    Patient Active Problem List   Diagnosis Date Noted  . Shaking spells 06/01/2016  . Gait disturbance 04/10/2015  . Numbness 04/10/2015  . Lumbar radicular pain 04/10/2015  . Lumbar spondylosis 04/10/2015  . Bipolar I disorder with depression, severe ([place]) 06/30/2014  . Suicidal ideations 06/30/2014  . Ulcer of lower limb, unspecified 02/20/2014  . CELLULITIS AND ABSCESS OF LEG EXCEPT FOOT 11/08/2007    Past Surgical History:  Procedure Laterality Date  . APPENDECTOMY     early 78's  . BACK SURGERY  05/2011   x 5  . blood clot in groin  early 80's  . CARPAL TUNNEL RELEASE     left  . CERVICAL SPINE SURGERY  2009  . COLONOSCOPY    . HERNIA REPAIR     early 2000's  . SHOULDER SURGERY  70's   right       Home  Medications    Prior to Admission medications   Medication Sig Start Date End Date Taking? Authorizing Provider  benztropine (COGENTIN) 2 MG tablet Take 2 mg by mouth daily. 06/16/16   Historical Provider, MD  buPROPion (WELLBUTRIN SR) 150 MG 12 hr tablet Take 150 mg by mouth daily.  05/29/16   Historical Provider, MD  cholecalciferol (VITAMIN D) 1000 units tablet Take 1,000 Units by mouth daily.    Historical Provider, MD  citalopram (CELEXA) 20 MG tablet Take 20 mg by mouth daily.    Historical Provider, MD  citalopram (CELEXA) 40 MG tablet Take 40 mg by mouth daily with breakfast.     Historical Provider, MD  divalproex (DEPAKOTE ER) 500 MG 24 hr tablet Take 250-500 mg by mouth 2 (two) times daily. Takes 250mg  in the morning and 500mg  in the evening 05/29/16   Historical Provider, MD  gabapentin (NEURONTIN) 300 MG capsule Take 300 mg by mouth 3 (three) times daily.    Historical Provider, MD  metFORMIN (GLUCOPHAGE) 500 MG tablet Take 500 mg by mouth 2 (two) times daily with a meal.    Historical Provider, MD  methocarbamol (ROBAXIN) 750 MG tablet Take 1 tablet by mouth 3 (three) times daily. 05/29/16   Historical Provider, MD  naproxen (NAPROSYN) 500 MG  tablet Take 500 mg by mouth 2 (two) times daily as needed for mild pain.     Historical Provider, MD  risperidone (RISPERDAL) 4 MG tablet Take 4 mg by mouth 2 (two) times daily.     Historical Provider, MD  sitaGLIPtin (JANUVIA) 100 MG tablet Take 100 mg by mouth daily with breakfast.    Historical Provider, MD  tamsulosin (FLOMAX) 0.4 MG CAPS capsule Take 0.4 mg by mouth daily.  04/08/16   Historical Provider, MD  traZODone (DESYREL) 100 MG tablet Take 200 mg by mouth at bedtime.     Historical Provider, MD    Family History Family History  Problem Relation Age of Onset  . Heart attack Father   . Anesthesia problems Neg Hx   . Hypotension Neg Hx   . Malignant hyperthermia Neg Hx   . Pseudochol deficiency Neg Hx     Social History Social  History  Substance Use Topics  . Smoking status: Current Every Day Smoker    Packs/day: 1.00    Years: 40.00    Types: Cigarettes  . Smokeless tobacco: Never Used     Comment: pt states that he is ready to quit smoking but thinks he will need patches advised him to speak with his PCP which he is seaing this Thursday 3/26  . Alcohol use No     Allergies   Penicillins   Review of Systems Review of Systems  Constitutional: Positive for activity change and fatigue. Negative for appetite change, chills, diaphoresis and unexpected weight change.  HENT: Negative for congestion, postnasal drip and rhinorrhea.   Eyes: Negative for visual disturbance.  Respiratory: Negative for cough, chest tightness and shortness of breath.   Cardiovascular: Negative for chest pain and palpitations.  Gastrointestinal: Negative for abdominal pain, constipation, diarrhea, nausea and vomiting.  Genitourinary: Negative for decreased urine volume, dysuria, frequency and genital sores.  Musculoskeletal: Positive for back pain (chronic). Negative for myalgias.  Skin: Negative for rash.  Neurological: Negative for dizziness, seizures, syncope, speech difficulty, weakness, light-headedness and numbness.  Hematological: Does not bruise/bleed easily.     Physical Exam Updated Vital Signs BP 154/78 (BP Location: Left Arm)   Pulse 90   Temp 97 F (36.1 C) (Rectal)   Resp 22   SpO2 95%   Physical Exam  Constitutional: He is oriented to person, place, and time. He appears well-developed and well-nourished. No distress.  HENT:  Head: Normocephalic and atraumatic.  Right Ear: External ear normal.  Left Ear: External ear normal.  Mouth/Throat: Oropharynx is clear and moist. No oropharyngeal exudate.  Eyes: EOM are normal. Pupils are equal, round, and reactive to light.  Neck: Normal range of motion. Neck supple.  Cardiovascular: Normal rate, regular rhythm, normal heart sounds and intact distal pulses.   No  murmur heard. Pulmonary/Chest: Effort normal. No respiratory distress. He has no wheezes. He has no rales.  Abdominal: Soft. He exhibits no distension. There is no tenderness.  Musculoskeletal: He exhibits no edema.  Neurological: He is alert and oriented to person, place, and time. He has normal strength. No sensory deficit.  Skin: Skin is warm and dry. No rash noted. He is not diaphoretic.  Psychiatric: He is slowed and withdrawn. He expresses no homicidal and no suicidal ideation. He expresses no suicidal plans and no homicidal plans.  Vitals reviewed.    ED Treatments / Results  Labs (all labs ordered are listed, but only abnormal results are displayed) Labs Reviewed  CBC WITH DIFFERENTIAL/PLATELET -  Abnormal; Notable for the following:       Result Value   RBC 3.81 (*)    Hemoglobin 12.0 (*)    HCT 35.5 (*)    All other components within normal limits  COMPREHENSIVE METABOLIC PANEL  ETHANOL  AMMONIA  URINALYSIS, ROUTINE W REFLEX MICROSCOPIC (NOT AT [facility])  URINE RAPID DRUG SCREEN, HOSP PERFORMED  I-STAT CG4 LACTIC ACID, ED  I-STAT CG4 LACTIC ACID, ED    EKG  EKG Interpretation  Date/Time:  Saturday June 27 2016 13:47:40 EDT Ventricular Rate:  79 PR Interval:    QRS Duration: 81 QT Interval:  396 QTC Calculation: 454 R Axis:   27 Text Interpretation:  Sinus rhythm No significant change since last tracing Confirmed by Lonia Skinner ([id number]) on 06/27/2016 2:26:53 PM       Radiology Dg Chest 2 View  Result Date: 06/27/2016 CLINICAL DATA:  Lethargy, weakness. EXAM: CHEST  2 VIEW COMPARISON:  06/01/2011 FINDINGS: Mild hyperinflation. Linear atelectasis in the left base. Right lung is clear. Heart is normal size. No effusions or acute bony abnormality. IMPRESSION: Hyperinflation.  Left base atelectasis.  No acute findings. Electronically Signed   By: Rolm Baptise M.D.   On: 06/27/2016 14:22  Ct Head Wo Contrast  Result Date: 06/27/2016 CLINICAL DATA:  Lethargy.   Abnormal gait. EXAM: CT HEAD WITHOUT CONTRAST TECHNIQUE: Contiguous axial images were obtained from the base of the skull through the vertex without intravenous contrast. COMPARISON:  06/18/2016; brain MRI - 05/13/2015 FINDINGS: Brain: Similar findings of age advanced atrophy with sulcal prominence and centralized volume loss with commensurate ex vacuo dilatation of the ventricular system, asymmetrically involving the right lateral ventricle. Grossly unchanged rather extensive periventricular hypodensities compatible microvascular ischemic disease. Given background parenchymal abnormalities, there is no CT evidence of superimposed acute large territory infarct. No intraparenchymal or extra-axial mass or hemorrhage. Vascular: Intracranial atherosclerosis Skull: No displaced calvarial fracture. Sinuses/Orbits: Limited visualization of the paranasal sinuses and mastoid air cells is normal. No air-fluid levels. Other: Regional soft tissues appear normal. IMPRESSION: Similar findings of age advanced atrophy and microvascular ischemic disease without acute intracranial process. Electronically Signed   By: Sandi Mariscal M.D.   On: 06/27/2016 14:30   Procedures Procedures (including critical care time)  Medications Ordered in ED Medications - No data to display   Initial Impression / Assessment and Plan / ED Course  I have reviewed the triage vital signs and the nursing notes.  Pertinent labs & imaging results that were available during my care of the patient were reviewed by me and considered in my medical decision making (see chart for details).  Clinical Course    Patient was seen and evaluated in stable condition. Benign neurologic examination without any acute neurologic changes or deficits. Patient well-appearing but with delayed speech and appears withdrawn. Patient denied suicidal or homicidal ideation. Initial laboratory studies unremarkable. CT head without acute process.  Case signed out pending  urine studies.  Final Clinical Impressions(s) / ED Diagnoses   Final diagnoses:  Other fatigue    New Prescriptions New Prescriptions   No medications on file     Harvel Quale, MD 06/27/16 1626

## 2016-06-27 NOTE — ED Notes (Signed)
Bed: [id number] Expected date:  Expected time:  Means of arrival:  Comments: No complaint-family wants eval

## 2016-06-27 NOTE — ED Notes (Signed)
RN to draw labs from IV 

## 2016-06-27 NOTE — ED Notes (Signed)
RN doesn't think I stat lactic is needed.  Will notify us if EDP wants it.

## 2016-06-27 NOTE — ED Notes (Signed)
Patient ambulated in hallway a short distance.  Starting HR: 98 finished was 109 Starting Sat: 97 finished was 100

## 2016-06-27 NOTE — ED Notes (Signed)
PTAR here to transport pt back to his home. 

## 2016-06-27 NOTE — ED Notes (Signed)
Pt tolerating po. Pt refusing I&O cath, and continues to attempt to give urine.

## 2016-06-27 NOTE — ED Notes (Signed)
Patient has attempted to urinate several times with assistance with no success.

## 2016-06-27 NOTE — ED Notes (Signed)
Daughter called stating she is unable to transport patient home.  Left number for updates: [phone]

## 2016-06-27 NOTE — Discharge Instructions (Signed)
The cause of your symptoms was not identified today.  Please follow up with your family doctor for further evaluation.  Get rechecked if you develop new or worsening symptoms.

## 2016-06-27 NOTE — ED Notes (Signed)
Pt attempted to get out of bed, patient provided urinal, pt made aware that urine sample is needed.

## 2016-06-27 NOTE — ED Triage Notes (Addendum)
Pt's caregiver called [phone] so patient could be evaluated for lethargy. Pt alert, oriented and able to answer questions. EMS reports patient was able to ambulate to the stretcher with abnormal gait, but caregiver was unable to report on patient's normal status. Pt with no complaints.

## 2016-06-27 NOTE — ED Provider Notes (Signed)
Pt visit shared. Patient here for generalized weakness, fatigue. He is nontoxic appearing on examination and in no distress. He is able to eat and ambulate without difficulty. No evidence of acute anemia, acute electrolyte abnormality, UTI. Discussed homecare, outpatient follow-up, return precautions.   Quintella Reichert, MD 06/27/16 [phone]

## 2016-06-30 ENCOUNTER — Ambulatory Visit (INDEPENDENT_AMBULATORY_CARE_PROVIDER_SITE_OTHER): Payer: Self-pay | Admitting: Neurology

## 2016-06-30 ENCOUNTER — Encounter: Payer: Self-pay | Admitting: Neurology

## 2016-06-30 ENCOUNTER — Telehealth: Payer: Self-pay | Admitting: Neurology

## 2016-06-30 ENCOUNTER — Ambulatory Visit (INDEPENDENT_AMBULATORY_CARE_PROVIDER_SITE_OTHER): Payer: Medicare Other | Admitting: Neurology

## 2016-06-30 DIAGNOSIS — R200 Anesthesia of skin: Secondary | ICD-10-CM

## 2016-06-30 DIAGNOSIS — M5416 Radiculopathy, lumbar region: Secondary | ICD-10-CM | POA: Diagnosis not present

## 2016-06-30 DIAGNOSIS — R269 Unspecified abnormalities of gait and mobility: Secondary | ICD-10-CM

## 2016-06-30 DIAGNOSIS — Z0289 Encounter for other administrative examinations: Secondary | ICD-10-CM

## 2016-06-30 NOTE — Progress Notes (Signed)
   [facility]  EEG (ELECTROENCEPHALOGRAM) REPORT   STUDY DATE: 06/23/2016 PATIENT NAME: [name] DOB: 1956-06-06 MRN: [id number]  ORDERING CLINICIAN: Richard A. Felecia Shelling, MD. PhD  TECHNOLOGIST: Laretta Alstrom TECHNIQUE: Electroencephalogram was recorded utilizing standard 10-20 system of lead placement and reformatted into average and bipolar montages.  RECORDING TIME: 30 minutes ACTIVATION: Hyperventilation and photic stimulation  CLINICAL INFORMATION: 60 year old man with shaking spells  FINDINGS: Background rhythms of 8 1/2 hertz and normal amplitudes. No focal, lateralizing, epileptiform activity or seizures are seen. Patient recorded in the awake and drowsy.   He did not enter stage II sleep state. EKG channel shows normal sinus rhythm.   There was a normal photic driving response to mid frequencies. Hyperventilation did not change the underlying rhythms.  IMPRESSION: This is a normal EEG while awake and drowsy.   INTERPRETING PHYSICIAN:   Richard A. Felecia Shelling, MD, PhD Certified in Neurology, [place] [facility], Sleep Medicine, Pain Medicine and Neuroimaging  [facility] [address], [place], [place] [address] [phone]

## 2016-06-30 NOTE — Progress Notes (Signed)
   STUDY DATE: @DATE @ PATIENT NAME: [name] DOB: 07-18-1956 MRN: [id number]  HISTORY:  60 year old man with lower back pain, numbness in the hands and legs and worsening gait  NERVE CONDUCTION STUDIES:  In the left leg, the peroneal motor response was very reduced in amplitude with normal conduction velocity and distal latency.     The posterior tibial motor response was nonresponsive. The superficial peroneal sensory response was nonresponsive.  In the right leg, the peroneal motor response, posterior tibial motor response and superficial peroneal sensory responses were nonresponsive.  In the left arm, the median motor, ulnar motor, median sensory and ulnar sensory responses were normal.  EMG STUDIES:  Needle EMG of selected muscles of the leg was performed.    Gluteus medius, vastus medialis tibialis anterior, peroneus longus and gastrocnemius showed no spontaneous activity and a mild increase in polyphasic motor units with mild neuropathic recruitment. The extrensor hallucis longus muscle spontaneous activity and a moderate number of polyphasic motor units that were recruited neuropathic leg. The abductor hallucis muscle showed a small number of positive sharp waves and fibrillation potentials. There was a moderate number of motor units with moderate neuropathic recruitment.  IMPRESSION:  This NCV/EMG shows the following: 1.    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 There was evidence of active features in the foot muscle tested. 2.    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    Jilleen Essner A. Felecia Shelling, MD, PhD Certified in Neurology, [place] Neurophys[facility], Sleep Medicine, Pain Medicine and Neuroimaging  [facility] [address], [place], [place] [address] [phone]

## 2016-06-30 NOTE — Telephone Encounter (Signed)
[name] [phone] called 1:38:59pm to advise, they are running behind for 2:00pm appointment today for NCS/EMG, she is having difficulty getting patient to the car, having difficulty walking. Will be here by 2pm, can't make it before 2pm for 15 minute early arrival.

## 2016-07-01 ENCOUNTER — Inpatient Hospital Stay (HOSPITAL_COMMUNITY)
Admission: EM | Admit: 2016-07-01 | Discharge: 2016-07-05 | DRG: 092 | Disposition: A | Discharge status: Home Health | Payer: Medicare Other | Attending: Internal Medicine | Admitting: Internal Medicine

## 2016-07-01 ENCOUNTER — Encounter (HOSPITAL_COMMUNITY): Payer: Self-pay

## 2016-07-01 ENCOUNTER — Emergency Department (HOSPITAL_COMMUNITY): Payer: Medicare Other

## 2016-07-01 DIAGNOSIS — T426X5A Adverse effect of other antiepileptic and sedative-hypnotic drugs, initial encounter: Secondary | ICD-10-CM | POA: Diagnosis present

## 2016-07-01 DIAGNOSIS — T43295A Adverse effect of other antidepressants, initial encounter: Secondary | ICD-10-CM | POA: Diagnosis present

## 2016-07-01 DIAGNOSIS — R400 Somnolence: Secondary | ICD-10-CM

## 2016-07-01 DIAGNOSIS — G9340 Encephalopathy, unspecified: Secondary | ICD-10-CM | POA: Diagnosis present

## 2016-07-01 DIAGNOSIS — E872 Acidosis: Secondary | ICD-10-CM | POA: Diagnosis present

## 2016-07-01 DIAGNOSIS — R945 Abnormal results of liver function studies: Secondary | ICD-10-CM

## 2016-07-01 DIAGNOSIS — F17210 Nicotine dependence, cigarettes, uncomplicated: Secondary | ICD-10-CM | POA: Diagnosis present

## 2016-07-01 DIAGNOSIS — Z86010 Personal history of colonic polyps: Secondary | ICD-10-CM

## 2016-07-01 DIAGNOSIS — Z981 Arthrodesis status: Secondary | ICD-10-CM

## 2016-07-01 DIAGNOSIS — G92 Toxic encephalopathy: Principal | ICD-10-CM | POA: Diagnosis present

## 2016-07-01 DIAGNOSIS — F319 Bipolar disorder, unspecified: Secondary | ICD-10-CM | POA: Diagnosis present

## 2016-07-01 DIAGNOSIS — G629 Polyneuropathy, unspecified: Secondary | ICD-10-CM | POA: Diagnosis present

## 2016-07-01 DIAGNOSIS — G8929 Other chronic pain: Secondary | ICD-10-CM | POA: Diagnosis present

## 2016-07-01 DIAGNOSIS — R4182 Altered mental status, unspecified: Secondary | ICD-10-CM | POA: Diagnosis present

## 2016-07-01 DIAGNOSIS — Z7984 Long term (current) use of oral hypoglycemic drugs: Secondary | ICD-10-CM | POA: Diagnosis not present

## 2016-07-01 DIAGNOSIS — R296 Repeated falls: Secondary | ICD-10-CM | POA: Diagnosis present

## 2016-07-01 DIAGNOSIS — T428X5A Adverse effect of antiparkinsonism drugs and other central muscle-tone depressants, initial encounter: Secondary | ICD-10-CM | POA: Diagnosis present

## 2016-07-01 DIAGNOSIS — T43225A Adverse effect of selective serotonin reuptake inhibitors, initial encounter: Secondary | ICD-10-CM | POA: Diagnosis present

## 2016-07-01 DIAGNOSIS — M5416 Radiculopathy, lumbar region: Secondary | ICD-10-CM | POA: Diagnosis present

## 2016-07-01 DIAGNOSIS — R269 Unspecified abnormalities of gait and mobility: Secondary | ICD-10-CM

## 2016-07-01 DIAGNOSIS — G912 (Idiopathic) normal pressure hydrocephalus: Secondary | ICD-10-CM | POA: Diagnosis present

## 2016-07-01 DIAGNOSIS — D72829 Elevated white blood cell count, unspecified: Secondary | ICD-10-CM | POA: Diagnosis present

## 2016-07-01 DIAGNOSIS — Z8249 Family history of ischemic heart disease and other diseases of the circulatory system: Secondary | ICD-10-CM

## 2016-07-01 DIAGNOSIS — E1151 Type 2 diabetes mellitus with diabetic peripheral angiopathy without gangrene: Secondary | ICD-10-CM | POA: Diagnosis present

## 2016-07-01 DIAGNOSIS — R5383 Other fatigue: Secondary | ICD-10-CM

## 2016-07-01 DIAGNOSIS — T43595A Adverse effect of other antipsychotics and neuroleptics, initial encounter: Secondary | ICD-10-CM | POA: Diagnosis present

## 2016-07-01 DIAGNOSIS — G919 Hydrocephalus, unspecified: Secondary | ICD-10-CM | POA: Diagnosis not present

## 2016-07-01 DIAGNOSIS — R7989 Other specified abnormal findings of blood chemistry: Secondary | ICD-10-CM

## 2016-07-01 LAB — GLUCOSE, CAPILLARY: GLUCOSE-CAPILLARY: 82 mg/dL (ref 65–99)

## 2016-07-01 LAB — URINE MICROSCOPIC-ADD ON
BACTERIA UA: NONE SEEN
SQUAMOUS EPITHELIAL / LPF: NONE SEEN

## 2016-07-01 LAB — URINALYSIS, ROUTINE W REFLEX MICROSCOPIC
BILIRUBIN URINE: NEGATIVE
Glucose, UA: NEGATIVE mg/dL
KETONES UR: NEGATIVE mg/dL
Leukocytes, UA: NEGATIVE
NITRITE: NEGATIVE
Protein, ur: NEGATIVE mg/dL
SPECIFIC GRAVITY, URINE: 1.013 (ref 1.005–1.030)
pH: 5.5 (ref 5.0–8.0)

## 2016-07-01 LAB — CBC
HCT: 38.2 % — ABNORMAL LOW (ref 39.0–52.0)
HEMATOCRIT: 36.3 % — AB (ref 39.0–52.0)
Hemoglobin: 12.4 g/dL — ABNORMAL LOW (ref 13.0–17.0)
Hemoglobin: 12.5 g/dL — ABNORMAL LOW (ref 13.0–17.0)
MCH: 32 pg (ref 26.0–34.0)
MCH: 31.1 pg (ref 26.0–34.0)
MCHC: 34.2 g/dL (ref 30.0–36.0)
MCHC: 32.7 g/dL (ref 30.0–36.0)
MCV: 93.6 fL (ref 78.0–100.0)
MCV: 95 fL (ref 78.0–100.0)
PLATELETS: 173 10*3/uL (ref 150–400)
Platelets: 180 10*3/uL (ref 150–400)
RBC: 3.88 MIL/uL — AB (ref 4.22–5.81)
RBC: 4.02 MIL/uL — AB (ref 4.22–5.81)
RDW: 12.5 % (ref 11.5–15.5)
RDW: 12.4 % (ref 11.5–15.5)
WBC: 11.4 10*3/uL — AB (ref 4.0–10.5)
WBC: 8.7 10*3/uL (ref 4.0–10.5)

## 2016-07-01 LAB — RAPID URINE DRUG SCREEN, HOSP PERFORMED
Amphetamines: NOT DETECTED
Barbiturates: NOT DETECTED
Benzodiazepines: NOT DETECTED
COCAINE: NOT DETECTED
OPIATES: NOT DETECTED
Tetrahydrocannabinol: NOT DETECTED

## 2016-07-01 LAB — COMPREHENSIVE METABOLIC PANEL
ALT: 69 U/L — ABNORMAL HIGH (ref 17–63)
ANION GAP: 7 (ref 5–15)
AST: 331 U/L — ABNORMAL HIGH (ref 15–41)
Albumin: 4.1 g/dL (ref 3.5–5.0)
Alkaline Phosphatase: 59 U/L (ref 38–126)
BUN: 14 mg/dL (ref 6–20)
CHLORIDE: 108 mmol/L (ref 101–111)
CO2: 27 mmol/L (ref 22–32)
Calcium: 9.5 mg/dL (ref 8.9–10.3)
Creatinine, Ser: 0.99 mg/dL (ref 0.61–1.24)
Glucose, Bld: 94 mg/dL (ref 65–99)
POTASSIUM: 3.7 mmol/L (ref 3.5–5.1)
Sodium: 142 mmol/L (ref 135–145)
TOTAL PROTEIN: 7.5 g/dL (ref 6.5–8.1)
Total Bilirubin: 0.9 mg/dL (ref 0.3–1.2)

## 2016-07-01 LAB — CREATININE, SERUM: CREATININE: 0.97 mg/dL (ref 0.61–1.24)

## 2016-07-01 LAB — I-STAT CG4 LACTIC ACID, ED
Lactic Acid, Venous: 2.52 mmol/L (ref 0.5–1.9)
Lactic Acid, Venous: 1.01 mmol/L (ref 0.5–1.9)

## 2016-07-01 LAB — AMMONIA: AMMONIA: 33 umol/L (ref 9–35)

## 2016-07-01 LAB — ETHANOL: Alcohol, Ethyl (B): <5 mg/dL (ref <5)

## 2016-07-01 LAB — ACETAMINOPHEN LEVEL: Acetaminophen (Tylenol), Serum: <10 ug/mL — ABNORMAL LOW (ref 10–30)

## 2016-07-01 MED ORDER — INSULIN ASPART 100 UNIT/ML ~~LOC~~ SOLN: 0.0000 [IU] | SUBCUTANEOUS | Status: DC

## 2016-07-01 MED ORDER — HEPARIN SODIUM (PORCINE) 5000 UNIT/ML IJ SOLN: 5000.0000 [IU] | Freq: Three times a day (TID) | INTRAMUSCULAR | Status: DC

## 2016-07-01 MED ORDER — ONDANSETRON HCL 4 MG PO TABS
4.0000 mg | ORAL_TABLET | Freq: Four times a day (QID) | ORAL | Status: DC | PRN
  Administered 2016-07-03: 4 mg via ORAL
  Filled 2016-07-01: qty 1

## 2016-07-01 MED ORDER — ONDANSETRON HCL 4 MG/2ML IJ SOLN: 4.0000 mg | Freq: Four times a day (QID) | INTRAMUSCULAR | Status: DC | PRN

## 2016-07-01 MED ORDER — ACETAMINOPHEN 325 MG PO TABS
650.0000 mg | ORAL_TABLET | Freq: Four times a day (QID) | ORAL | Status: DC | PRN
Start: 2016-07-01 — End: 2016-07-05

## 2016-07-01 MED ORDER — ACETAMINOPHEN 650 MG RE SUPP: 650.0000 mg | Freq: Four times a day (QID) | RECTAL | Status: DC | PRN

## 2016-07-01 MED ORDER — SODIUM CHLORIDE 0.9 % IV SOLN
INTRAVENOUS | Status: DC
Start: 2016-07-01 — End: 2016-07-04
  Administered 2016-07-02 (×2): via INTRAVENOUS

## 2016-07-01 MED ORDER — SODIUM CHLORIDE 0.9 % IV BOLUS (SEPSIS)
1000.0000 mL | Freq: Once | INTRAVENOUS | Status: AC
  Administered 2016-07-01: 1000 mL via INTRAVENOUS

## 2016-07-01 NOTE — ED Notes (Addendum)
Called [facility] cone [name] wicker [phone]  Report called  Called care link service ordered  Called care link report called. Paper work complete doing Marine scientist now

## 2016-07-01 NOTE — ED Triage Notes (Signed)
Caregiver and pt's. Sone called EMS d/t "difficulty walking recently and today he was found to be, and continues to be, very lethargic.  He arrives here nearly somnolent, and responds slowly to very loud, enthusiastic stimulus.  EMS states he was drowsy and awake and they were able to assist him up off the floor into a w/c at scene.

## 2016-07-01 NOTE — Consult Note (Signed)
Reason for Consult: Altered mental status  Referring Physician: Emergency department  [name] is an 60 y.o. male.  HPI: 60 year old gentleman with a history of mental health disorder resides at behavioral health facility who has been evaluated multiple times over the last couple weeks for mental status changes with 3 CT scans of his head since the 20th which have shown slight diffuse enlargement of his ventricular system consistent with mild communicating hydrocephalus. The patient apparently has been seen as an outpatient with neurology and Dr. Felecia Shelling for workup with back pain abnormal gait has undergone EEG as well as EMG and nerve conduction test. Apparently this workup has been negative. Patient was brought back to the ER today with slight mental status changes and CT scan display the above-mentioned slight enlargement of his ventricular system.  Past Medical History:  Diagnosis Date  . Chronic back pain   . Diabetes mellitus without complication ([place])   . DVT (deep venous thrombosis) ([place])   . History of colon polyps   . Mental disorder    bipolar  . Peripheral vascular disease ([place])   . Pneumonia    hx of-about 86yr ago    Past Surgical History:  Procedure Laterality Date  . APPENDECTOMY     early 947's . BACK SURGERY  05/2011   x 5  . blood clot in groin  early 80's  . CARPAL TUNNEL RELEASE     left  . CERVICAL SPINE SURGERY  2009  . COLONOSCOPY    . HERNIA REPAIR     early 2000's  . SHOULDER SURGERY  70's   right    Family History  Problem Relation Age of Onset  . Heart attack Father   . Anesthesia problems Neg Hx   . Hypotension Neg Hx   . Malignant hyperthermia Neg Hx   . Pseudochol deficiency Neg Hx     Social History:  reports that he has been smoking Cigarettes.  He has a 40.00 pack-year smoking history. He has never used smokeless tobacco. He reports that he does not drink alcohol or use drugs.  Allergies:  Allergies  Allergen Reactions  .  Penicillins Hives and Other (See Comments)    Has patient had a PCN reaction causing immediate rash, facial/tongue/throat swelling, SOB or lightheadedness with hypotension: No Has patient had a PCN reaction causing severe rash involving mucus membranes or skin necrosis: No Has patient had a PCN reaction that required hospitalization No Has patient had a PCN reaction occurring within the last 10 years: No If all of the above answers are "NO", then may proceed with Cephalosporin use.    Medications: I have reviewed the patient's current medications.  Results for orders placed or performed during the hospital encounter of 07/01/16 (from the past 48 hour(s))  I-Stat CG4 Lactic Acid, ED     Status: Abnormal   Collection Time: 07/01/16  4:21 PM  Result Value Ref Range   Lactic Acid, Venous 2.52 (HH) 0.5 - 1.9 mmol/L   Comment NOTIFIED PHYSICIAN   CBC     Status: Abnormal   Collection Time: 07/01/16  4:26 PM  Result Value Ref Range   WBC 11.4 (H) 4.0 - 10.5 K/uL   RBC 3.88 (L) 4.22 - 5.81 MIL/uL   Hemoglobin 12.4 (L) 13.0 - 17.0 g/dL   HCT 36.3 (L) 39.0 - 52.0 %   MCV 93.6 78.0 - 100.0 fL   MCH 32.0 26.0 - 34.0 pg   MCHC 34.2 30.0 -  36.0 g/dL   RDW 12.5 11.5 - 15.5 %   Platelets 180 150 - 400 K/uL  Acetaminophen level     Status: Abnormal   Collection Time: 07/01/16  4:26 PM  Result Value Ref Range   Acetaminophen (Tylenol), Serum <10 (L) 10 - 30 ug/mL    Comment:        THERAPEUTIC CONCENTRATIONS VARY SIGNIFICANTLY. A RANGE OF 10-30 ug/mL MAY BE AN EFFECTIVE CONCENTRATION FOR MANY PATIENTS. HOWEVER, SOME ARE BEST TREATED AT CONCENTRATIONS OUTSIDE THIS RANGE. ACETAMINOPHEN CONCENTRATIONS >150 ug/mL AT 4 HOURS AFTER INGESTION AND >50 ug/mL AT 12 HOURS AFTER INGESTION ARE OFTEN ASSOCIATED WITH TOXIC REACTIONS.   Ethanol     Status: None   Collection Time: 07/01/16  4:26 PM  Result Value Ref Range   Alcohol, Ethyl (B) <5 <5 mg/dL    Comment:        LOWEST DETECTABLE LIMIT  FOR SERUM ALCOHOL IS 5 mg/dL FOR MEDICAL PURPOSES ONLY   Comprehensive metabolic panel     Status: Abnormal   Collection Time: 07/01/16  4:26 PM  Result Value Ref Range   Sodium 142 135 - 145 mmol/L   Potassium 3.7 3.5 - 5.1 mmol/L   Chloride 108 101 - 111 mmol/L   CO2 27 22 - 32 mmol/L   Glucose, Bld 94 65 - 99 mg/dL   BUN 14 6 - 20 mg/dL   Creatinine, Ser 0.99 0.61 - 1.24 mg/dL   Calcium 9.5 8.9 - 10.3 mg/dL   Total Protein 7.5 6.5 - 8.1 g/dL   Albumin 4.1 3.5 - 5.0 g/dL   AST 331 (H) 15 - 41 U/L    Comment: RESULTS CONFIRMED BY MANUAL DILUTION   ALT 69 (H) 17 - 63 U/L   Alkaline Phosphatase 59 38 - 126 U/L   Total Bilirubin 0.9 0.3 - 1.2 mg/dL   GFR calc non Af Amer >60 >60 mL/min   GFR calc Af Amer >60 >60 mL/min    Comment: (NOTE) The eGFR has been calculated using the CKD EPI equation. This calculation has not been validated in all clinical situations. eGFR's persistently <60 mL/min signify possible Chronic Kidney Disease.    Anion gap 7 5 - 15  Ammonia     Status: None   Collection Time: 07/01/16  4:26 PM  Result Value Ref Range   Ammonia 33 9 - 35 umol/L  Urinalysis, Routine w reflex microscopic (not at [facility])     Status: Abnormal   Collection Time: 07/01/16  5:21 PM  Result Value Ref Range   Color, Urine YELLOW YELLOW   APPearance CLEAR CLEAR   Specific Gravity, Urine 1.013 1.005 - 1.030   pH 5.5 5.0 - 8.0   Glucose, UA NEGATIVE NEGATIVE mg/dL   Hgb urine dipstick LARGE (A) NEGATIVE   Bilirubin Urine NEGATIVE NEGATIVE   Ketones, ur NEGATIVE NEGATIVE mg/dL   Protein, ur NEGATIVE NEGATIVE mg/dL   Nitrite NEGATIVE NEGATIVE   Leukocytes, UA NEGATIVE NEGATIVE  Rapid urine drug screen (hospital performed)     Status: None   Collection Time: 07/01/16  5:21 PM  Result Value Ref Range   Opiates NONE DETECTED NONE DETECTED   Cocaine NONE DETECTED NONE DETECTED   Benzodiazepines NONE DETECTED NONE DETECTED   Amphetamines NONE DETECTED NONE DETECTED    Tetrahydrocannabinol NONE DETECTED NONE DETECTED   Barbiturates NONE DETECTED NONE DETECTED    Comment:        DRUG SCREEN FOR MEDICAL PURPOSES ONLY.  IF CONFIRMATION  IS NEEDED FOR ANY PURPOSE, NOTIFY LAB WITHIN 5 DAYS.        LOWEST DETECTABLE LIMITS FOR URINE DRUG SCREEN Drug Class       Cutoff (ng/mL) Amphetamine      1000 Barbiturate      200 Benzodiazepine   585 Tricyclics       277 Opiates          300 Cocaine          300 THC              50   Urine microscopic-add on     Status: None   Collection Time: 07/01/16  5:21 PM  Result Value Ref Range   Squamous Epithelial / LPF NONE SEEN NONE SEEN   WBC, UA 0-5 0 - 5 WBC/hpf   RBC / HPF 0-5 0 - 5 RBC/hpf   Bacteria, UA NONE SEEN NONE SEEN    Ct Head Wo Contrast  Result Date: 07/01/2016 CLINICAL DATA:  Difficulty walking recently. Lethargy. Altered mental status. EXAM: CT HEAD WITHOUT CONTRAST TECHNIQUE: Contiguous axial images were obtained from the base of the skull through the vertex without intravenous contrast. COMPARISON:  CT head without contrast 06/27/2016 and 06/18/2016. MRI brain 05/03/2015. FINDINGS: Moderate ventricular enlargement is present bilaterally. There is slight progression when compared to previous MRI 05/13/2015 is well is the CT scan 06/18/2016. Periventricular white matter hypoattenuation has also slightly progressed when compared with the recent CT scans. This combination suggests worsening hydrocephalus. No hemorrhage or infarct is present. Remote white matter changes extending into the external capsule bilaterally and remote lacunar infarcts of the basal ganglia are stable. The brainstem and cerebellum are unremarkable. The paranasal sinuses and mastoid air cells are clear. The calvarium is intact. IMPRESSION: Subtle changes of worsening ventricular enlargement and periventricular white matter hypoattenuation suggesting worsening hydrocephalus. Electronically Signed   By: San Morelle M.D.   On:  07/01/2016 17:11    Review of Systems  Unable to perform ROS: Mental status change   Blood pressure 133/65, pulse 101, temperature 99 F (37.2 C), temperature source Oral, resp. rate 24, SpO2 96 %. Physical Exam  Constitutional: He appears well-developed. He appears lethargic.  HENT:  Head: Normocephalic.  Eyes: Pupils are equal, round, and reactive to light.  GI: Soft. Bowel sounds are normal.  Neurological: He appears lethargic. GCS eye subscore is 2. GCS verbal subscore is 4. GCS motor subscore is 5.  Patient is lethargic pupils are equal and reactive to light they are mid position they do not open to voice. They do open to stimulation he does withdraw to pain and localizes but I could not get a follow commands he did track and looked to me while I was stimulating him.    Assessment/Plan: 60 year old with a decline in mental status of unknown etiology. There is some element of increased ventricular size consistent with communicating hydrocephalus since his first CT scan on July 20. Initial laboratory data in the ER show slight elevation of his white blood cell count some elevation of his liver function tests as well as gross hematuria no overt sign of infection on his urinalysis. Etiology of increased ventricular size remains uncertain. And the patient's lethargy is disproportionate to the increase in size of his ventricles taking into account marked cortical atrophy. I have recommended neurology consult for workup of possible infectious etiology or metabolic etiology to altered mental status and ventriculomegaly. I would favor proceeding' \\forward'$  with lumbar puncture which should be  safe in the setting of communicating hydrocephalus for workup of infectious etiology. Would favor keeping ventricular drain drain placement, intraperitoneal shunting, or endoscopic third ventricular fenestration as last resort. Until we determine why he's had this change. Would also consider MRI scan without  contrast pending neurology's evaluation.  Eldo Umanzor P 07/01/2016, 6:31 PM

## 2016-07-01 NOTE — H&P (Signed)
History and Physical    [name] [id number] DOB: 02-Oct-1956 DOA: 07/01/2016  PCP: Philis Fendt, MD  Patient coming from: Home  Chief Complaint: Ataxia/mental status changes  HPI: [name] is a 60 y.o. male with medical history significant of diabetes mellitus, chronic back pain, having several emergency room visits over the past month for recurrent falls presenting to the emergency room with complaints of ongoing falls and mental status changes. He was brought to the ER by EMS. He was recently given a [facility] where he underwent EEG. Emergency room provider reporting that C NA found patient lying on floor and called EMS. He had been ambulating earlier the day tolerating by mouth intake. On the field he had negative focal neurological deficits and was able to follow commands. In the emergency department patient is unable to provide history.   ED Course: In the emergency department he is obtunded, unable to participate in a neurologic exam or provide a history. A CT scan of the brain showed possibly worsening ventricular enlargement and periventricular white matter hypoattenuation consistent with hydrocephalus. Emergency room provider consulted Dr. Saintclair Halsted of neurosurgery who recommended a neurology consultation and transferred to [facility] for further workup and evaluation. I discussed case with Dr Shon Hale of Neurology who agreed with patient's transfer to [facility] where he will be evaluated by neurology this evening.  Review of Systems: Unable to obtain reliable review of systems.   Past Medical History:  Diagnosis Date  . Chronic back pain   . Diabetes mellitus without complication ([place])   . DVT (deep venous thrombosis) ([place])   . History of colon polyps   . Mental disorder    bipolar  . Peripheral vascular disease ([place])   . Pneumonia    hx of-about 31yrs ago    Past Surgical History:  Procedure Laterality Date  . APPENDECTOMY     early 14's  . BACK SURGERY  05/2011   x 5  . blood clot in groin  early 80's  . CARPAL TUNNEL RELEASE     left  . CERVICAL SPINE SURGERY  2009  . COLONOSCOPY    . HERNIA REPAIR     early 2000's  . SHOULDER SURGERY  70's   right     reports that he has been smoking Cigarettes.  He has a 40.00 pack-year smoking history. He has never used smokeless tobacco. He reports that he does not drink alcohol or use drugs.  Allergies  Allergen Reactions  . Penicillins Hives and Other (See Comments)    Has patient had a PCN reaction causing immediate rash, facial/tongue/throat swelling, SOB or lightheadedness with hypotension: No Has patient had a PCN reaction causing severe rash involving mucus membranes or skin necrosis: No Has patient had a PCN reaction that required hospitalization No Has patient had a PCN reaction occurring within the last 10 years: No If all of the above answers are "NO", then may proceed with Cephalosporin use.    Family History  Problem Relation Age of Onset  . Heart attack Father   . Anesthesia problems Neg Hx   . Hypotension Neg Hx   . Malignant hyperthermia Neg Hx   . Pseudochol deficiency Neg Hx      Prior to Admission medications   Medication Sig Start Date End Date Taking? Authorizing Provider  benztropine (COGENTIN) 2 MG tablet Take 2 mg by mouth at bedtime.    Yes Historical Provider, MD  buPROPion (  WELLBUTRIN SR) 150 MG 12 hr tablet Take 150 mg by mouth daily.    Yes Historical Provider, MD  cholecalciferol (VITAMIN D) 1000 units tablet Take 1,000 Units by mouth daily.   Yes Historical Provider, MD  citalopram (CELEXA) 20 MG tablet Take 20 mg by mouth daily. Pt takes with a 40mg  tablet.   Yes Historical Provider, MD  citalopram (CELEXA) 40 MG tablet Take 40 mg by mouth daily. Pt takes with a 20mg  tablet.   Yes Historical Provider, MD  divalproex (DEPAKOTE ER) 500 MG 24 hr tablet Take 250-500 mg by mouth 2 (two) times daily. Pt takes one-half tablet in the  morning and one at bedtime.   Yes Historical Provider, MD  gabapentin (NEURONTIN) 300 MG capsule Take 300 mg by mouth 3 (three) times daily.   Yes Historical Provider, MD  metFORMIN (GLUCOPHAGE) 500 MG tablet Take 500 mg by mouth 2 (two) times daily with a meal.   Yes Historical Provider, MD  methocarbamol (ROBAXIN) 750 MG tablet Take 750 mg by mouth 3 (three) times daily.    Yes Historical Provider, MD  naproxen (NAPROSYN) 500 MG tablet Take 500 mg by mouth 2 (two) times daily.    Yes Historical Provider, MD  risperidone (RISPERDAL) 4 MG tablet Take 4 mg by mouth 2 (two) times daily.    Yes Historical Provider, MD  sitaGLIPtin (JANUVIA) 100 MG tablet Take 100 mg by mouth daily with breakfast.   Yes Historical Provider, MD  tamsulosin (FLOMAX) 0.4 MG CAPS capsule Take 0.4 mg by mouth daily after supper.    Yes Historical Provider, MD    Physical Exam: Vitals:   07/01/16 1530 07/01/16 1538  BP: 133/65   Pulse: 101   Resp: 24   Temp: 99 F (37.2 C)   TempSrc: Oral   SpO2: 91% 96%      Constitutional: NAD, calm, comfortable Vitals:   07/01/16 1530 07/01/16 1538  BP: 133/65   Pulse: 101   Resp: 24   Temp: 99 F (37.2 C)   TempSrc: Oral   SpO2: 91% 96%   Eyes: Pupils were equal round reactive to light however sluggish ENMT: Mucous membranes are moist. Posterior pharynx clear of any exudate or lesions.Normal dentition.  Neck: normal, supple, no masses, no thyromegaly Respiratory: clear to auscultation bilaterally, no wheezing, no crackles. Normal respiratory effort. No accessory muscle use.  Cardiovascular: Regular rate and rhythm, no murmurs / rubs / gallops. No extremity edema. 2+ pedal pulses. No carotid bruits.  Abdomen: no tenderness, no masses palpated. No hepatosplenomegaly. Bowel sounds positive.  Musculoskeletal: no clubbing / cyanosis. No joint deformity upper and lower extremities. Good ROM, no contractures. Normal muscle tone.  Skin: no rashes, lesions, ulcers. No  induration Neurologic: I cannot perform adequate neurologic exam, patient is obtunded, unable to participate in neurologic evaluation. Psychiatric: Patient is obtunded    Labs on Admission: I have personally reviewed following labs and imaging studies  CBC:  Recent Labs Lab 06/27/16 1406 07/01/16 1626  WBC 8.2 11.4*  NEUTROABS 4.2  --   HGB 12.0* 12.4*  HCT 35.5* 36.3*  MCV 93.2 93.6  PLT 158 [id number]   Basic Metabolic Panel:  Recent Labs Lab 06/27/16 1406 07/01/16 1626  NA 141 142  K 3.8 3.7  CL 106 108  CO2 28 27  GLUCOSE 76 94  BUN 9 14  CREATININE 0.90 0.99  CALCIUM 9.2 9.5   GFR: CrCl cannot be calculated (Unknown ideal weight.). Liver Function Tests:  Recent Labs Lab 06/27/16 1406 07/01/16 1626  AST 27 331*  ALT 20 69*  ALKPHOS 64 59  BILITOT 0.9 0.9  PROT 7.3 7.5  ALBUMIN 4.1 4.1   No results for input(s): LIPASE, AMYLASE in the last 168 hours.  Recent Labs Lab 06/27/16 1512 07/01/16 1626  AMMONIA 14 33   Coagulation Profile: No results for input(s): INR, PROTIME in the last 168 hours. Cardiac Enzymes: No results for input(s): CKTOTAL, CKMB, CKMBINDEX, TROPONINI in the last 168 hours. BNP (last 3 results) No results for input(s): PROBNP in the last 8760 hours. HbA1C: No results for input(s): HGBA1C in the last 72 hours. CBG: No results for input(s): GLUCAP in the last 168 hours. Lipid Profile: No results for input(s): CHOL, HDL, LDLCALC, TRIG, CHOLHDL, LDLDIRECT in the last 72 hours. Thyroid Function Tests: No results for input(s): TSH, T4TOTAL, FREET4, T3FREE, THYROIDAB in the last 72 hours. Anemia Panel: No results for input(s): VITAMINB12, FOLATE, FERRITIN, TIBC, IRON, RETICCTPCT in the last 72 hours. Urine analysis:    Component Value Date/Time   COLORURINE YELLOW 07/01/2016 1721   APPEARANCEUR CLEAR 07/01/2016 1721   LABSPEC 1.013 07/01/2016 1721   PHURINE 5.5 07/01/2016 1721   GLUCOSEU NEGATIVE 07/01/2016 1721   HGBUR LARGE  (A) 07/01/2016 1721   BILIRUBINUR NEGATIVE 07/01/2016 1721   KETONESUR NEGATIVE 07/01/2016 1721   PROTEINUR NEGATIVE 07/01/2016 1721   UROBILINOGEN 1.0 11/26/2011 1002   NITRITE NEGATIVE 07/01/2016 1721   LEUKOCYTESUR NEGATIVE 07/01/2016 1721   Sepsis Labs: !!!!!!!!!!!!!!!!!!!!!!!!!!!!!!!!!!!!!!!!!!!! @LABRCNTIP (procalcitonin:4,lacticidven:4) )No results found for this or any previous visit (from the past 240 hour(s)).   Radiological Exams on Admission: Ct Head Wo Contrast  Result Date: 07/01/2016 CLINICAL DATA:  Difficulty walking recently. Lethargy. Altered mental status. EXAM: CT HEAD WITHOUT CONTRAST TECHNIQUE: Contiguous axial images were obtained from the base of the skull through the vertex without intravenous contrast. COMPARISON:  CT head without contrast 06/27/2016 and 06/18/2016. MRI brain 05/03/2015. FINDINGS: Moderate ventricular enlargement is present bilaterally. There is slight progression when compared to previous MRI 05/13/2015 is well is the CT scan 06/18/2016. Periventricular white matter hypoattenuation has also slightly progressed when compared with the recent CT scans. This combination suggests worsening hydrocephalus. No hemorrhage or infarct is present. Remote white matter changes extending into the external capsule bilaterally and remote lacunar infarcts of the basal ganglia are stable. The brainstem and cerebellum are unremarkable. The paranasal sinuses and mastoid air cells are clear. The calvarium is intact. IMPRESSION: Subtle changes of worsening ventricular enlargement and periventricular white matter hypoattenuation suggesting worsening hydrocephalus. Electronically Signed   By: San Morelle M.D.   On: 07/01/2016 17:11    EKG: Independently reviewed.   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  Hydrocephalus. Mr Current is a 60 year old gentleman  having several ED visits for ataxia and recurrent falls. CT scan performed in the emergency room showing bilateral ventriculomegaly. Unclear if he has had urinary or bowel incontinence, I could not obtain history from him in the emergency room. I think normal pressure hydrocephalus would be on the differential. Other possibilities include hydrocephalus ex vacuo. Both Dr.Cram of neurosurgery and Dr Shon Hale of neurology were consulted from the emergency department. MRI of brain has been ordered. I discussed further workup with lumbar puncture with neurology who will becoming by to assess and make further recommendations this evening. Patient will be transferred to [facility]  to undergo further workup and evaluation.   2.  History of recurrent falls. Patient having several emergency room visits for recurrent falls unclear etiology. Apparently he was given a follow-up appointment with [facility] neurology undergoing EEG and EMG which was negative. Now CT scan showing hydrocephalus. Obtain a neurology consultation for further recommendations.   3.  Acute encephalopathy. Patient is obtunded in the emergency room unclear if this is related to hydrocephalus on CT of brain. I reviewed his med list as he is on multiple psychotropic medications including Neurontin, Robaxin, Risperdal, Depakote, Celexa, Wellbutrin. A urine drug screen performed in the emergency room was negative. Plan to hold all psychotropic medications overnight, reassess mental status in a.m.  4.  Elevated AST. Labs showing an elevated AST of 331 with ALT of 69. Alcohol level was undetectable. This is a significant elevation compared to previous studies from 06/27/2016 that showed an AST of 27. Will obtain a right upper quadrant ultrasound.  5.  Leukocytosis. Patient having a white count of 11,400, with urine, back negative, chest x-ray negative, no other findings on physical exam. Will obtain blood cultures. He was afebrile in the emergency  department. Watch him overnight off of antimicrobial therapy, low threshold for starting antibiotics.  6.  Diabetes mellitus. He will be Nothing by mouth overnight, provide sliding scale coverage every 4 hours. Discontinue oral hypoglycemic medications.   DVT prophylaxis: Subcutaneous heparin Code Status: Full code Family Communication: Family not available Disposition Plan: Plan to transfer him to [place] called: Neurology and neurosurgery Admission status: Inpatient   Kelvin Cellar MD [facility] Pager [phone]  If 7PM-7AM, please contact night-coverage [email or web address] Password [email or web address]  07/01/2016, 7:07 PM

## 2016-07-01 NOTE — ED Notes (Signed)
Pt's personal nurse called Ed, nurse works for [employer]. [place] contact [phone]   Nurse wishes to speak with pt's family member ( son) if he shows up.

## 2016-07-01 NOTE — ED Notes (Signed)
Elevated labs result MD campos have been made aware

## 2016-07-01 NOTE — ED Provider Notes (Signed)
[facility] DEPT Provider Note   CSN: [id number] Arrival date & time: 07/01/16  1519  First Provider Contact:  First MD Initiated Contact with Patient 07/01/16 1555     History   Chief Complaint Chief Complaint  Patient presents with  . Altered Mental Status   HPI [name] is a 60 y.o. male.  HPI 61 y.o. male with a hx of DM, Bipolar Disorder, PVD, presents to the Emergency Department today via EMS due to fall, lethargy today. Seen previously on 7-29 and 7-20 for fatigue and fall respectively. Referred to neuro with appointment yesterday due to weakness in legs. Normal EEG. Today, EMS responded to call to house from CNA due to pt lying on floor. Per EMS, pt was ambulating this AM and eating breakfast with CNA. When she returned around 11am, pt was found leaning against the TV stand on the floor. Unlikely serious head trauma as TV stand unstable and would have fallen on him if traumatic fall occurred as noted by EMS. They state that they were able to stand patient up and place him on wheelchair. EMS noted negative stroke screen as well as pt being able to respond to commands. Requires repeat questioning.    4:33 PM- Spoke with Engineer, manufacturing systems. Noted ability to feed himself yesterday with ADLs intact. Unable today. Concern for repeat falls and AMS.    Level V Caveat: AMS  Past Medical History:  Diagnosis Date  . Chronic back pain   . Diabetes mellitus without complication ([place])   . DVT (deep venous thrombosis) ([place])   . History of colon polyps   . Mental disorder    bipolar  . Peripheral vascular disease ([place])   . Pneumonia    hx of-about 53yrs ago    Patient Active Problem List   Diagnosis Date Noted  . Shaking spells 06/01/2016  . Gait disturbance 04/10/2015  . Numbness 04/10/2015  . Lumbar radicular pain 04/10/2015  . Lumbar spondylosis 04/10/2015  . Bipolar I disorder with depression, severe ([place]) 06/30/2014  . Suicidal ideations 06/30/2014  . Ulcer of lower limb,  unspecified 02/20/2014  . CELLULITIS AND ABSCESS OF LEG EXCEPT FOOT 11/08/2007    Past Surgical History:  Procedure Laterality Date  . APPENDECTOMY     early 12's  . BACK SURGERY  05/2011   x 5  . blood clot in groin  early 80's  . CARPAL TUNNEL RELEASE     left  . CERVICAL SPINE SURGERY  2009  . COLONOSCOPY    . HERNIA REPAIR     early 2000's  . SHOULDER SURGERY  70's   right       Home Medications    Prior to Admission medications   Medication Sig Start Date End Date Taking? Authorizing Provider  benztropine (COGENTIN) 2 MG tablet Take 2 mg by mouth at bedtime.     Historical Provider, MD  buPROPion (WELLBUTRIN SR) 150 MG 12 hr tablet Take 150 mg by mouth daily.     Historical Provider, MD  cholecalciferol (VITAMIN D) 1000 units tablet Take 1,000 Units by mouth daily.    Historical Provider, MD  citalopram (CELEXA) 20 MG tablet Take 20 mg by mouth daily. Pt takes with a 40mg  tablet.    Historical Provider, MD  citalopram (CELEXA) 40 MG tablet Take 40 mg by mouth daily. Pt takes with a 20mg  tablet.    Historical Provider, MD  divalproex (DEPAKOTE ER) 500 MG 24 hr tablet Take 250-500 mg by mouth  2 (two) times daily. Pt takes one-half tablet in the morning and one at bedtime.    Historical Provider, MD  gabapentin (NEURONTIN) 300 MG capsule Take 300 mg by mouth 3 (three) times daily.    Historical Provider, MD  metFORMIN (GLUCOPHAGE) 500 MG tablet Take 500 mg by mouth 2 (two) times daily with a meal.    Historical Provider, MD  methocarbamol (ROBAXIN) 750 MG tablet Take 750 mg by mouth 3 (three) times daily.     Historical Provider, MD  naproxen (NAPROSYN) 500 MG tablet Take 500 mg by mouth 2 (two) times daily.     Historical Provider, MD  risperidone (RISPERDAL) 4 MG tablet Take 4 mg by mouth 2 (two) times daily.     Historical Provider, MD  sitaGLIPtin (JANUVIA) 100 MG tablet Take 100 mg by mouth daily with breakfast.    Historical Provider, MD  tamsulosin (FLOMAX) 0.4 MG CAPS  capsule Take 0.4 mg by mouth daily after supper.     Historical Provider, MD    Family History Family History  Problem Relation Age of Onset  . Heart attack Father   . Anesthesia problems Neg Hx   . Hypotension Neg Hx   . Malignant hyperthermia Neg Hx   . Pseudochol deficiency Neg Hx     Social History Social History  Substance Use Topics  . Smoking status: Current Every Day Smoker    Packs/day: 1.00    Years: 40.00    Types: Cigarettes  . Smokeless tobacco: Never Used     Comment: pt states that he is ready to quit smoking but thinks he will need patches advised him to speak with his PCP which he is seaing this Thursday 3/26  . Alcohol use No     Allergies   Penicillins   Review of Systems Review of Systems  Unable to perform ROS: Mental status change   Physical Exam Updated Vital Signs BP 133/65 (BP Location: Left Arm)   Pulse 101   Temp 99 F (37.2 C) (Oral)   Resp 24   SpO2 96%   Physical Exam  Constitutional: Vital signs are normal. He appears well-developed and well-nourished.  Pt opens eyes to command. Able to follow commands. Does not respond to questioning. Able to say first name.    HENT:  Head: Normocephalic.  Right Ear: Hearing normal.  Left Ear: Hearing normal.  Eyes: Conjunctivae and EOM are normal. Pupils are equal, round, and reactive to light.  Neck: Normal range of motion.  Cardiovascular: Normal rate, regular rhythm, normal heart sounds and intact distal pulses.   Pulmonary/Chest: Effort normal and breath sounds normal. No respiratory distress.  Abdominal: Soft. There is no tenderness.  Musculoskeletal: Normal range of motion.  BLE/BUE neurovascularly intact.   Neurological: He is alert. He has normal strength.  Raises both arms without difficulty. Raises both legs without difficulty.   Skin: Skin is warm and dry.  Psychiatric: He has a normal mood and affect. Thought content normal. He is slowed and withdrawn. He is noncommunicative.    ED Treatments / Results  Labs (all labs ordered are listed, but only abnormal results are displayed) Labs Reviewed  CBC - Abnormal; Notable for the following:       Result Value   WBC 11.4 (*)    RBC 3.88 (*)    Hemoglobin 12.4 (*)    HCT 36.3 (*)    All other components within normal limits  URINALYSIS, ROUTINE W REFLEX MICROSCOPIC (NOT AT [facility]) -  Abnormal; Notable for the following:    Hgb urine dipstick LARGE (*)    All other components within normal limits  ACETAMINOPHEN LEVEL - Abnormal; Notable for the following:    Acetaminophen (Tylenol), Serum <10 (*)    All other components within normal limits  COMPREHENSIVE METABOLIC PANEL - Abnormal; Notable for the following:    AST 331 (*)    ALT 69 (*)    All other components within normal limits  I-STAT CG4 LACTIC ACID, ED - Abnormal; Notable for the following:    Lactic Acid, Venous 2.52 (*)    All other components within normal limits  URINE RAPID DRUG SCREEN, HOSP PERFORMED  ETHANOL  AMMONIA  URINE MICROSCOPIC-ADD ON   EKG  EKG Interpretation None       Radiology Ct Head Wo Contrast  Result Date: 07/01/2016 CLINICAL DATA:  Difficulty walking recently. Lethargy. Altered mental status. EXAM: CT HEAD WITHOUT CONTRAST TECHNIQUE: Contiguous axial images were obtained from the base of the skull through the vertex without intravenous contrast. COMPARISON:  CT head without contrast 06/27/2016 and 06/18/2016. MRI brain 05/03/2015. FINDINGS: Moderate ventricular enlargement is present bilaterally. There is slight progression when compared to previous MRI 05/13/2015 is well is the CT scan 06/18/2016. Periventricular white matter hypoattenuation has also slightly progressed when compared with the recent CT scans. This combination suggests worsening hydrocephalus. No hemorrhage or infarct is present. Remote white matter changes extending into the external capsule bilaterally and remote lacunar infarcts of the basal ganglia are stable.  The brainstem and cerebellum are unremarkable. The paranasal sinuses and mastoid air cells are clear. The calvarium is intact. IMPRESSION: Subtle changes of worsening ventricular enlargement and periventricular white matter hypoattenuation suggesting worsening hydrocephalus. Electronically Signed   By: San Morelle M.D.   On: 07/01/2016 17:11    Procedures Procedures (including critical care time)  Medications Ordered in ED Medications  sodium chloride 0.9 % bolus 1,000 mL (1,000 mLs Intravenous New Bag/Given 07/01/16 1614)    Initial Impression / Assessment and Plan / ED Course  I have reviewed the triage vital signs and the nursing notes.  Pertinent labs & imaging results that were available during my care of the patient were reviewed by me and considered in my medical decision making (see chart for details).  Clinical Course  Value Comment By Time  Lactic Acid, Venous: (!!) 2.52 Lactic Acid 2.52. Fluid Bolus given. Unlikely infectious etiology. Temp 70F. Will repeat after fluids Shary Decamp, PA-C 08/02 1626    Final Clinical Impressions(s) / ED Diagnoses  I have reviewed and evaluated the relevant laboratory values I have reviewed and evaluated the relevant imaging studies.  I have reviewed the relevant previous healthcare records. I obtained HPI from historian. Patient discussed with supervising physician  ED Course:  Assessment: Pt is a 12yM with hx DM, Bipolar Disorder, PVD who presents via EMS due to lethargy and AMS this AM. Found by CNA this AM. On exam, pt in NAD. Nontoxic/nonseptic appearing. VSS. Afebrile. Lungs CTA. Heart RRR. Abdomen nontender soft. Pt slowed and responds to motor commands, but does not respond to questioning. No pronator drift. iStat lactate 2.52. CBC/BMP with WBC 11.4. AST 331. Ammonia WNL. UA unremarkable CT Head non contrast with worsening hydrocephalus. Given NS bolus in ED. Pt seen and evaluated by supervising physician. AMS psychiatric etiology?      Consult with Neurosurgery (Dr. Saintclair Halsted) will see patient. Unimpressed by hydrocephalus. Unlikely further interventions from neurosurgical standpoint. Will admit to medicine and transfer  to [facility]. Possible infectious etiology? Consult to Neurology for LP to eval for meningitis   Disposition/Plan:  Admit   Supervising Physician Jola Schmidt, MD   Final diagnoses:  Altered mental status, unspecified altered mental status type    New Prescriptions New Prescriptions   No medications on file       Shary Decamp, PA-C 07/01/16 [place], MD 07/03/16 [phone]

## 2016-07-01 NOTE — ED Notes (Signed)
A nurse from [employer] with information and concerns; I put the call through to our P.A., Dorothea Ogle.

## 2016-07-02 ENCOUNTER — Inpatient Hospital Stay (HOSPITAL_COMMUNITY): Payer: Medicare Other

## 2016-07-02 ENCOUNTER — Other Ambulatory Visit (HOSPITAL_COMMUNITY): Payer: Medicare Other

## 2016-07-02 DIAGNOSIS — G9340 Encephalopathy, unspecified: Secondary | ICD-10-CM

## 2016-07-02 LAB — CBC
HCT: 34.8 % — ABNORMAL LOW (ref 39.0–52.0)
HEMOGLOBIN: 11.4 g/dL — AB (ref 13.0–17.0)
MCH: 31.1 pg (ref 26.0–34.0)
MCHC: 32.8 g/dL (ref 30.0–36.0)
MCV: 95.1 fL (ref 78.0–100.0)
Platelets: 165 10*3/uL (ref 150–400)
RBC: 3.66 MIL/uL — ABNORMAL LOW (ref 4.22–5.81)
RDW: 12.3 % (ref 11.5–15.5)
WBC: 8.8 10*3/uL (ref 4.0–10.5)

## 2016-07-02 LAB — VALPROIC ACID LEVEL: VALPROIC ACID LVL: 67 ug/mL (ref 50.0–100.0)

## 2016-07-02 LAB — HIV ANTIBODY (ROUTINE TESTING W REFLEX): HIV Screen 4th Generation wRfx: NONREACTIVE

## 2016-07-02 LAB — COMPREHENSIVE METABOLIC PANEL
ALBUMIN: 3.2 g/dL — AB (ref 3.5–5.0)
ALK PHOS: 52 U/L (ref 38–126)
ALT: 65 U/L — ABNORMAL HIGH (ref 17–63)
ANION GAP: 6 (ref 5–15)
AST: 297 U/L — ABNORMAL HIGH (ref 15–41)
BILIRUBIN TOTAL: 0.4 mg/dL (ref 0.3–1.2)
BUN: 10 mg/dL (ref 6–20)
CALCIUM: 9 mg/dL (ref 8.9–10.3)
CO2: 27 mmol/L (ref 22–32)
Chloride: 109 mmol/L (ref 101–111)
Creatinine, Ser: 0.83 mg/dL (ref 0.61–1.24)
GFR calc Af Amer: >60 mL/min (ref >60)
GLUCOSE: 95 mg/dL (ref 65–99)
Potassium: 3.7 mmol/L (ref 3.5–5.1)
Sodium: 142 mmol/L (ref 135–145)
TOTAL PROTEIN: 6.2 g/dL — AB (ref 6.5–8.1)

## 2016-07-02 LAB — TSH: TSH: 2.644 u[IU]/mL (ref 0.350–4.500)

## 2016-07-02 LAB — RPR: RPR: NONREACTIVE

## 2016-07-02 LAB — GLUCOSE, CAPILLARY
GLUCOSE-CAPILLARY: 116 mg/dL — AB (ref 65–99)
Glucose-Capillary: 104 mg/dL — ABNORMAL HIGH (ref 65–99)
Glucose-Capillary: 131 mg/dL — ABNORMAL HIGH (ref 65–99)
Glucose-Capillary: 163 mg/dL — ABNORMAL HIGH (ref 65–99)

## 2016-07-02 LAB — FOLATE: FOLATE: 11.9 ng/mL (ref >5.9)

## 2016-07-02 LAB — LACTIC ACID, PLASMA: Lactic Acid, Venous: 0.9 mmol/L (ref 0.5–1.9)

## 2016-07-02 LAB — VITAMIN B12: VITAMIN B 12: 538 pg/mL (ref 180–914)

## 2016-07-02 MED ORDER — HEPARIN SODIUM (PORCINE) 5000 UNIT/ML IJ SOLN
5000.0000 [IU] | Freq: Three times a day (TID) | INTRAMUSCULAR | Status: DC
  Administered 2016-07-02 – 2016-07-04 (×6): 5000 [IU] via SUBCUTANEOUS
  Filled 2016-07-02 (×4): qty 1

## 2016-07-02 MED ORDER — INSULIN ASPART 100 UNIT/ML ~~LOC~~ SOLN
0.0000 [IU] | Freq: Three times a day (TID) | SUBCUTANEOUS | Status: DC
  Administered 2016-07-02 – 2016-07-03 (×2): 1 [IU] via SUBCUTANEOUS
  Administered 2016-07-04: 3 [IU] via SUBCUTANEOUS
  Administered 2016-07-04 – 2016-07-05 (×2): 1 [IU] via SUBCUTANEOUS

## 2016-07-02 MED ORDER — GADOBENATE DIMEGLUMINE 529 MG/ML IV SOLN
20.0000 mL | Freq: Once | INTRAVENOUS | Status: AC | PRN
  Administered 2016-07-02: 20 mL via INTRAVENOUS

## 2016-07-02 NOTE — Procedures (Signed)
Electroencephalogram (EEG) Report  Date of study: 07/02/16  Requesting clinician: Etta Quill, PA  Reason for study: Evaluate for seizure  Brief clinical history: 57-yo man with episodic confusion and falls.   Medications:  Current Facility-Administered Medications:  .  0.9 %  sodium chloride infusion, , Intravenous, Continuous, Kelvin Cellar, MD, Last Rate: 100 mL/hr at 07/02/16 1138 .  acetaminophen (TYLENOL) tablet 650 mg, 650 mg, Oral, Q6H PRN **OR** acetaminophen (TYLENOL) suppository 650 mg, 650 mg, Rectal, Q6H PRN, Kelvin Cellar, MD .  heparin injection 5,000 Units, 5,000 Units, Subcutaneous, Q8H, Greta Doom, MD .  insulin aspart (novoLOG) injection 0-9 Units, 0-9 Units, Subcutaneous, TID WC, Dawood S Elgergawy, MD .  ondansetron (ZOFRAN) tablet 4 mg, 4 mg, Oral, Q6H PRN **OR** ondansetron (ZOFRAN) injection 4 mg, 4 mg, Intravenous, Q6H PRN, Kelvin Cellar, MD  Description: This is a routine EEG performed using standard international 10-20 electrode placement. A total of 18 channels are recorded, including one for the EKG.  Activating Maneuvers: Photic stimulation  Findings:  The EKG channel demonstrates are regular rhythm with a rate of 90-100 beats per minute.   The background consists of moderate voltage alpha activity with intermixed theta. There is a mild degree of intermixed delta activity as well. The best dominant posterior rhythm is 8-9. This is symmetric and reacts as expected with eye opening.   There are no focal asymmetries. No epileptiform discharges are present. No seizures are recorded.   Drowsiness is recorded and is normal in appearance.  There is a normal response to photic stimulation.  Impression: This is a normal awake and drowsy EEG.    Melba Coon, MD [facility]

## 2016-07-02 NOTE — Progress Notes (Addendum)
Patient seen and examined. HE is alert and awake, able to follow three step commands, name objects, name month, city and year.  HE has a slight postural and intention tremor and peripheral neuropathy of LE. MRI shows no acute stroke and ventriculomegaly which may represent NPH but this is a out patient work up. Will obtain EEG.  If EEG is without etiology for his AMS he will need to follow up with Dr. Felecia Shelling neurology as a out patient for MRI findings.   Etta Quill PA-C [facility] [phone]  07/02/2016, 10:57 AM  Neurology Attending Addendum  Patient seen, d/w PA. I have reveiwed his note and agree.   Patient is alert and fully oriented at this time. He is able to provide good history surrounding his spells. He reports that for the past several months he has been falling. These episodes are constant in their semiology. All have occurred shortly after he stood up from a seated position. He experiences a flushing sensation and feels very hot and then the next thing he knows he is waking up on the floor. On waking, he reports he is aware of his surroundings but he feels tired for the next 30-60 minutes. He states that his significant other has witnessed these episodes and tells him that he has a "dead look" with a tendency to stare through her without responding. He does not think that he has had any convulsive activity with these spells, or at least he has not been told of any such activity. He denies any gait changes, urinary incontinence, or cognitive impairment.   He has no clinical features to support a diagnosis of NPH. While scans show hydrocephalus, this has been stable on MRI compared to scans from one year ago. I do not think this is playing any role in his symptoms as reported. He has nothing to suggest CNS infection based upon reported history so I see no role for LP at this time. EEG today is normal.   He is describing orthostatic syncope. 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 I suspect that he also has autonomic neuropathy which may be contributing to these spells. Both of these forms of neuropathy are exceedingly common in diabetic patients. He is on a high dose of risperidone which can cause orthostasis is its own right and which can certainly exacerbate orthostasis from other causes. Citalopram may also cause orthostatic hypotension. Gabapentin has rarely been reported to cause orthostasis.   At this point, I do not see any indication for an LP. I agree with decreasing his dose of risperidone. Recommend syncope evaluation. He would benefit from gait training given his neuropathy if this has not already been offered.

## 2016-07-02 NOTE — Progress Notes (Signed)
I spoke with patient's home health nurse and she voiced her concerns regarding patient's discharge. She informed me that he would be unable to meet with his psychiatrist for about 1.5 wks with regards to changing his medication. She also has concerns about his living status. The patient lives alone and the nurse said that the patient is very unsteady and falls multiple times per day. The patient does have a home health aide but only stays at the house for a couple of hours. I informed the nurse that if she was able to stop by the unit tomorrow she could speak with our CM and SW to hopefully provide better care at home for the patient when he is set to discharge.

## 2016-07-02 NOTE — Progress Notes (Signed)
PROGRESS NOTE                                                                                                                                                                                                             Patient Demographics:    [name], is a 60 y.o. male, DOB - 1956/05/26, YN:[id number]  Admit date - 07/01/2016   Admitting Physician Kelvin Cellar, MD  Outpatient Primary MD for the patient is Philis Fendt, MD  LOS - 1  Chief Complaint  Patient presents with  . Altered Mental Status       Brief Narrative   60 year old male with history of diabetes mellitus, chronic back pain, bipolar disorder on Risperdal, presents with ataxia, altered mental status, seen by neurology for evaluation, MRI brain significant for ventriculomegaly.   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 - Neurosurgery consult greatly appreciated. - PT consult  Acute encephalopathy.  -  unclear if this is related to hydrocephalus on CT of brain.  he is on multiple psychotropic medications including Neurontin, Robaxin, Risperdal, Depakote, Celexa, Wellbutrin continue to hold to hold all psychotropic medications, reassess mental status in a.m.  Diabetes mellitus - Continue to hold  oral medication, start on insulin sliding scale  Bipolar disorder - Continue to hold psychogenic medication with deficits contributing to his encephalopathy   Code Status : Full  Family Communication  : None at bedside  Disposition Plan  : Pending PT evaluation  Consults  :  Neurosurgery next  neurology  Procedures  : None  DVT Prophylaxis  :  Heparin   Lab Results  Component Value Date   PLT 165 07/02/2016    Antibiotics  :    Anti-infectives    None        Objective:   Vitals:   07/01/16 2130 07/01/16 2239 07/02/16 0407 07/02/16 1000  BP: 131/69 140/68 140/67 (!) 148/77  Pulse: 82 89 88 88  Resp: 15 16 20 18   Temp:  98.9 F (37.2 C) 97.7 F (36.5 C) 98.2 F (36.8 C)  TempSrc:  Axillary Oral Oral  SpO2: 97% 95% 95% 98%  Weight:  97.3 kg (214 lb 9.6 oz)    Height:   6\' 1"  (1.854 m)     Wt Readings from Last 3 Encounters:  07/01/16 97.3 kg (214 lb 9.6 oz)  06/01/16 98.2 kg (216 lb 8 oz)  04/10/15 99.2 kg (218 lb 12.8 oz)     Intake/Output Summary (Last 24 hours) at 07/02/16 1405 Last data filed at 07/02/16 1125  Gross per 24 hour  Intake                0 ml  Output              600 ml  Net             -600 ml     Physical Exam  Awake Alert, Oriented X 2 [place].AT,PERRAL Supple Neck,No JVD, No cervical lymphadenopathy appriciated.  Symmetrical Chest wall movement, Good air movement bilaterally, CTAB RRR,No Gallops,Rubs or new Murmurs, No Parasternal Heave +ve B.Sounds, Abd Soft, No tenderness, No organomegaly appriciated, No rebound - guarding or rigidity. No Cyanosis, Clubbing or edema, No new Rash or bruise      Data Review:    CBC  Recent Labs Lab 06/27/16 1406 07/01/16 1626 07/01/16 2258 07/02/16 0225  WBC 8.2 11.4* 8.7 8.8  HGB 12.0* 12.4* 12.5* 11.4*  HCT 35.5* 36.3* 38.2* 34.8*  PLT 158 180 173 165  MCV 93.2 93.6 95.0 95.1  MCH 31.5 32.0 31.1 31.1  MCHC 33.8 34.2 32.7 32.8  RDW 12.3 12.5 12.4 12.3  LYMPHSABS 3.1  --   --   --   MONOABS 0.6  --   --   --   EOSABS 0.2  --   --   --   BASOSABS 0.0  --   --   --     Chemistries   Recent Labs Lab 06/27/16 1406 07/01/16 1626 07/01/16 2258 07/02/16 0225  NA 141 142  --  142  K 3.8 3.7  --  3.7  CL 106 108  --  109  CO2 28 27  --  27  GLUCOSE 76 94  --  95  BUN 9  14  --  10  CREATININE 0.90 0.99 0.97 0.83  CALCIUM 9.2 9.5  --  9.0  AST 27 331*  --  297*  ALT 20 69*  --  65*  ALKPHOS 64 59  --  52  BILITOT 0.9 0.9  --  0.4   ------------------------------------------------------------------------------------------------------------------ No results for input(s): CHOL, HDL, LDLCALC, TRIG, CHOLHDL, LDLDIRECT in the last 72 hours.  Lab Results  Component Value Date   HGBA1C 6.5 (H) 06/15/2014   ------------------------------------------------------------------------------------------------------------------  Recent Labs  07/01/16 2258  TSH 2.644   ------------------------------------------------------------------------------------------------------------------  Recent Labs  07/01/16 2258  VITAMINB12 538  FOLATE 11.9    Coagulation profile No results for input(s): INR, PROTIME in the last 168 hours.  No results for input(s): DDIMER in the last 72 hours.  Cardiac Enzymes No results for input(s): CKMB, TROPONINI, MYOGLOBIN in the last 168 hours.  Invalid input(s): CK ------------------------------------------------------------------------------------------------------------------ No results found for: BNP  Inpatient Medications  Scheduled Meds: . heparin  5,000 Units Subcutaneous Q8H  . insulin aspart  0-15 Units Subcutaneous Q4H   Continuous Infusions: . sodium chloride     PRN Meds:.acetaminophen **OR**  acetaminophen, ondansetron **OR** ondansetron (ZOFRAN) IV  Micro Results Recent Results (from the past 240 hour(s))  Culture, blood (Routine X 2) w Reflex to ID Panel     Status: None (Preliminary result)   Collection Time: 07/01/16 11:09 PM  Result Value Ref Range Status   Specimen Description BLOOD RIGHT ARM  Final   Special Requests IN PEDIATRIC BOTTLE 1CC  Final   Culture NO GROWTH < 24 HOURS  Final   Report Status PENDING  Incomplete  Culture, blood (Routine X 2) w Reflex to ID Panel     Status: None (Preliminary  result)   Collection Time: 07/01/16 11:17 PM  Result Value Ref Range Status   Specimen Description BLOOD RIGHT HAND  Final   Special Requests BOTTLES DRAWN AEROBIC AND ANAEROBIC 5CC  Final   Culture NO GROWTH < 24 HOURS  Final   Report Status PENDING  Incomplete    Radiology Reports Dg Chest 2 View  Result Date: 06/27/2016 CLINICAL DATA:  Lethargy, weakness. EXAM: CHEST  2 VIEW COMPARISON:  06/01/2011 FINDINGS: Mild hyperinflation. Linear atelectasis in the left base. Right lung is clear. Heart is normal size. No effusions or acute bony abnormality. IMPRESSION: Hyperinflation.  Left base atelectasis.  No acute findings. Electronically Signed   By: Rolm Baptise M.D.   On: 06/27/2016 14:22  Dg Lumbar Spine Complete  Result Date: 06/18/2016 CLINICAL DATA:  Fall today.  Low back pain.  Initial encounter. EXAM: LUMBAR SPINE - COMPLETE 4+ VIEW COMPARISON:  05/18/2016 FINDINGS: There is no evidence of lumbar spine fracture. Alignment is normal. Posterior lumbar fusion hardware again seen from levels of L2-L5, with interbody cage at L4-5. Other disc spaces are maintained. Left-sided facet DJD noted at L5-S1. Aortic atherosclerosis noted. IMPRESSION: No acute findings. Stable appearance of lumbar spine fusion. Left L5-S1 facet DJD noted. Aortic atherosclerosis noted. Electronically Signed   By: Earle Gell M.D.   On: 06/18/2016 18:34   Ct Head Wo Contrast  Result Date: 07/01/2016 CLINICAL DATA:  Difficulty walking recently. Lethargy. Altered mental status. EXAM: CT HEAD WITHOUT CONTRAST TECHNIQUE: Contiguous axial images were obtained from the base of the skull through the vertex without intravenous contrast. COMPARISON:  CT head without contrast 06/27/2016 and 06/18/2016. MRI brain 05/03/2015. FINDINGS: Moderate ventricular enlargement is present bilaterally. There is slight progression when compared to previous MRI 05/13/2015 is well is the CT scan 06/18/2016. Periventricular white matter hypoattenuation  has also slightly progressed when compared with the recent CT scans. This combination suggests worsening hydrocephalus. No hemorrhage or infarct is present. Remote white matter changes extending into the external capsule bilaterally and remote lacunar infarcts of the basal ganglia are stable. The brainstem and cerebellum are unremarkable. The paranasal sinuses and mastoid air cells are clear. The calvarium is intact. IMPRESSION: Subtle changes of worsening ventricular enlargement and periventricular white matter hypoattenuation suggesting worsening hydrocephalus. Electronically Signed   By: San Morelle M.D.   On: 07/01/2016 17:11   Ct Head Wo Contrast  Result Date: 06/27/2016 CLINICAL DATA:  Lethargy.  Abnormal gait. EXAM: CT HEAD WITHOUT CONTRAST TECHNIQUE: Contiguous axial images were obtained from the base of the skull through the vertex without intravenous contrast. COMPARISON:  06/18/2016; brain MRI - 05/13/2015 FINDINGS: Brain: Similar findings of age advanced atrophy with sulcal prominence and centralized volume loss with commensurate ex vacuo dilatation of the ventricular system, asymmetrically involving the right lateral ventricle. Grossly unchanged rather extensive periventricular hypodensities compatible microvascular ischemic disease. Given background parenchymal abnormalities, there is no  CT evidence of superimposed acute large territory infarct. No intraparenchymal or extra-axial mass or hemorrhage. Vascular: Intracranial atherosclerosis Skull: No displaced calvarial fracture. Sinuses/Orbits: Limited visualization of the paranasal sinuses and mastoid air cells is normal. No air-fluid levels. Other: Regional soft tissues appear normal. IMPRESSION: Similar findings of age advanced atrophy and microvascular ischemic disease without acute intracranial process. Electronically Signed   By: Sandi Mariscal M.D.   On: 06/27/2016 14:30  Ct Cervical Spine Wo Contrast  Result Date:  06/18/2016 CLINICAL DATA:  60 y/o M; patient found lying on the floor, denies loss of consciousness for head injury. EXAM: CT HEAD WITHOUT CONTRAST CT CERVICAL SPINE WITHOUT CONTRAST TECHNIQUE: Multidetector CT imaging of the head and cervical spine was performed following the standard protocol without intravenous contrast. Multiplanar CT image reconstructions of the cervical spine were also generated. COMPARISON:  CT head dated 09/21/2006. MRI of brain dated 05/13/2015. FINDINGS: CT HEAD FINDINGS No evidence of large acute infarct, mass effect, or intracranial hemorrhage. Moderate diffuse volume loss. Nonspecific hypoattenuation in subcortical and periventricular white matter corresponding to T2 FLAIR hyperintense signal abnormality on the prior MRI probably represents chronic microvascular ischemic changes. No extra-axial collection is identified. Ventricles are moderately enlarged borderline for degree of volume loss and increased from 2007. No high attenuation vessel. No displaced skull fracture. Visualized paranasal sinuses and mastoids are clear. Orbits are unremarkable. Extensive calcific atherosclerosis of cavernous internal carotid arteries. CT CERVICAL SPINE FINDINGS Alignment: Straightening of cervical lordosis is likely positional. No listhesis. Bones: Post anterior cervical discectomy and fusion of Celsius 4 through C6. Hardware appears intact. No para hardware related complication. There are endplate degenerative changes and disc space narrowing at C6 through T1. There is mild multilevel facet arthrosis. There is no high-grade bony canal stenosis or foraminal narrowing. Atlantoaxial and atlantooccipital articulations are maintained. No acute fracture is identified. Soft tissues: Biapical scarring of the lungs. Normal thyroid gland. No lymphadenopathy. No prevertebral soft tissue swelling. Visualized pharynx, larynx, and subglottic airway are normal. IMPRESSION: 1. No evidence of large acute infarct,  mass effect, or intracranial hemorrhage. 2. Moderate interval increase in size of lateral ventricles in 2007, borderline for degree of volume loss, may represent hydrocephalus. 3. No acute fracture or dislocation of the cervical spine. These results were called by telephone at the time of interpretation on 06/18/2016 at 6:11 pm to Dr. Dorie Rank , who verbally acknowledged these results. Electronically Signed   By: Kristine Garbe M.D.   On: 06/18/2016 18:16   Mr Jeri Cos [id number] Contrast  Result Date: 07/02/2016 CLINICAL DATA:  Altered mental status, diagnosis of normal pressure hydrocephalus. History of diabetes. EXAM: MRI HEAD WITHOUT AND WITH CONTRAST TECHNIQUE: Multiplanar, multiecho pulse sequences of the brain and surrounding structures were obtained without and with intravenous contrast. CONTRAST:  22mL MULTIHANCE GADOBENATE DIMEGLUMINE 529 MG/ML IV SOLN COMPARISON:  CT HEAD July 01, 2016 and CT HEAD June 26, 2016 and MRI of the brain May 13, 2015 FINDINGS: INTRACRANIAL CONTENTS: No reduced diffusion to suggest acute ischemia. No susceptibility artifact to suggest hemorrhage. Stable moderate to severe ventriculomegaly, anterior recess of third ventricle is 12 mm, unchanged. Disproportionate sulcal effacement at the convexities. Confluent supratentorial and pontine white matter FLAIR T2 hyperintensities similar to prior imaging. No midline shift, mass effect or masses. No abnormal intracranial enhancement. No suspicious parenchymal signal, masses or mass effect. No abnormal extra-axial fluid collections. No extra-axial masses though, contrast enhanced sequences would be more sensitive. Normal major intracranial vascular flow voids present at  skull base. ORBITS: The included ocular globes and orbital contents are non-suspicious. SINUSES: The mastoid air-cells and included paranasal sinuses are well-aerated. SKULL/SOFT TISSUES: No abnormal sellar expansion. No suspicious calvarial bone marrow signal.  Craniocervical junction maintained. IMPRESSION: No acute intracranial process. Stable findings of normal pressure hydrocephalus and moderate to severe chronic small vessel ischemic disease. No convincing component of interstitial edema/transependymal flow cerebral spinal fluid. Electronically Signed   By: Elon Alas M.D.   On: 07/02/2016 03:05   Dg Hip Unilat With Pelvis 2-3 Views Left  Result Date: 06/18/2016 CLINICAL DATA:  Fall on lying on floor as patient reports 3 false today. Low back and left hip pain. EXAM: DG HIP (WITH OR WITHOUT PELVIS) 2-3V LEFT COMPARISON:  None. FINDINGS: Examination demonstrates minimal symmetric degenerative change of the hips. No evidence of acute fracture or dislocation. Fusion hardware partially visualized over the lower lumbar spine. IMPRESSION: No acute findings. Electronically Signed   By: Marin Olp M.D.   On: 06/18/2016 18:36   Ct Head Code Stroke W/o Cm  Result Date: 06/18/2016 CLINICAL DATA:  60 y/o M; patient found lying on the floor, denies loss of consciousness for head injury. EXAM: CT HEAD WITHOUT CONTRAST CT CERVICAL SPINE WITHOUT CONTRAST TECHNIQUE: Multidetector CT imaging of the head and cervical spine was performed following the standard protocol without intravenous contrast. Multiplanar CT image reconstructions of the cervical spine were also generated. COMPARISON:  CT head dated 09/21/2006. MRI of brain dated 05/13/2015. FINDINGS: CT HEAD FINDINGS No evidence of large acute infarct, mass effect, or intracranial hemorrhage. Moderate diffuse volume loss. Nonspecific hypoattenuation in subcortical and periventricular white matter corresponding to T2 FLAIR hyperintense signal abnormality on the prior MRI probably represents chronic microvascular ischemic changes. No extra-axial collection is identified. Ventricles are moderately enlarged borderline for degree of volume loss and increased from 2007. No high attenuation vessel. No displaced skull  fracture. Visualized paranasal sinuses and mastoids are clear. Orbits are unremarkable. Extensive calcific atherosclerosis of cavernous internal carotid arteries. CT CERVICAL SPINE FINDINGS Alignment: Straightening of cervical lordosis is likely positional. No listhesis. Bones: Post anterior cervical discectomy and fusion of Celsius 4 through C6. Hardware appears intact. No para hardware related complication. There are endplate degenerative changes and disc space narrowing at C6 through T1. There is mild multilevel facet arthrosis. There is no high-grade bony canal stenosis or foraminal narrowing. Atlantoaxial and atlantooccipital articulations are maintained. No acute fracture is identified. Soft tissues: Biapical scarring of the lungs. Normal thyroid gland. No lymphadenopathy. No prevertebral soft tissue swelling. Visualized pharynx, larynx, and subglottic airway are normal. IMPRESSION: 1. No evidence of large acute infarct, mass effect, or intracranial hemorrhage. 2. Moderate interval increase in size of lateral ventricles in 2007, borderline for degree of volume loss, may represent hydrocephalus. 3. No acute fracture or dislocation of the cervical spine. These results were called by telephone at the time of interpretation on 06/18/2016 at 6:11 pm to Dr. Dorie Rank , who verbally acknowledged these results. Electronically Signed   By: Kristine Garbe M.D.   On: 06/18/2016 18:16   US Abdomen Limited Ruq  Result Date: 07/02/2016 CLINICAL DATA:  60 year old male with right upper quadrant abdominal pain and elevated LFTs. EXAM: US ABDOMEN LIMITED - RIGHT UPPER QUADRANT COMPARISON:  Right upper quadrant ultrasound dated 03/30/2014 FINDINGS: Gallbladder: There is stones within the gallbladder. No gallbladder wall thickening or pericholecystic fluid. Negative sonographic Murphy's sign. Common bile duct: Diameter: 3 mm Liver: There is mild coarsening of the hepatic echotexture  with minimal contour irregularity.  Clinical correlation is recommended. IMPRESSION: Cholelithiasis without sonographic evidence acute cholecystitis. Slightly irregular hepatic contour. Electronically Signed   By: Anner Crete M.D.   On: 07/02/2016 03:49     Prudence Heiny M.D on 07/02/2016 at 2:05 PM  Between 7am to 7pm - Pager - [phone]  After 7pm go to [email or web address] - password [place]  [facility] -  Office  [phone]

## 2016-07-02 NOTE — Consult Note (Signed)
Neurology Consultation Reason for Consult: Lethargy Referring Physician: Scherrie Gerlach, E  CC: Lethargy  History is obtained from: Chart, patient  HPI: [name] is a 60 y.o. male with a history of bipolar disorder on chronic large doses of Risperdal. He presented to the emergency department tonight with complaints of ongoing falls and mental status changes. Apparently he has had a waxing and waning mental status and propensity to fall over the past month. The emergency department multiple times. He has a nursing aide who helps him with his medications, and she found him at 11 AM on the floor. The day prior to admission, he had intact ADLs.  He has been evaluated by Dr. Rachel Moulds on 7/03 who ordered an EEG and EMG. He noted radicular pain and that time. He was noted to be alert and oriented at that time. 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  He currently was lethargic, but with repeated stimulation, he is able to answer questions and follow commands. He denies headache, denies too much of his medication.  ROS: Unable to obtain due to altered mental status.   Past Medical History:  Diagnosis Date  . Chronic back pain   . Diabetes mellitus without complication ([place])   . DVT (deep venous thrombosis) ([place])   . History of colon polyps   . Mental disorder    bipolar  . Peripheral vascular disease ([place])   . Pneumonia    hx of-about 77yrs ago     Family History  Problem Relation Age of Onset  . Heart attack Father   . Anesthesia problems Neg Hx   . Hypotension Neg Hx   . Malignant hyperthermia Neg Hx   . Pseudochol deficiency Neg Hx      Social History:  reports that he has been smoking Cigarettes.  He has a 40.00 pack-year smoking history. He has never used smokeless tobacco. He reports that he does not drink alcohol or use drugs.   Exam: Current vital signs: BP 140/68 (BP Location: Right Arm)   Pulse 89   Temp 98.9  F (37.2 C) (Axillary)   Resp 16   Wt 97.3 kg (214 lb 9.6 oz)   SpO2 95%   BMI 28.31 kg/m  Vital signs in last 24 hours: Temp:  [98.9 F (37.2 C)-99 F (37.2 C)] 98.9 F (37.2 C) (08/02 2239) Pulse Rate:  [82-101] 89 (08/02 2239) Resp:  [15-24] 16 (08/02 2239) BP: (123-140)/(65-73) 140/68 (08/02 2239) SpO2:  [91 %-99 %] 95 % (08/02 2239) Weight:  [97.3 kg (214 lb 9.6 oz)] 97.3 kg (214 lb 9.6 oz) (08/02 2239)   Physical Exam  Constitutional: Appears well-developed and well-nourished.  Psych: Affect appropriate to situation Eyes: No scleral injection HENT: No OP obstrucion Head: Normocephalic.  Cardiovascular: Normal rate and regular rhythm.  Respiratory: Effort normal and breath sounds normal to anterior ascultation GI: Soft.  No distension. There is no tenderness.  Skin: WDI  Neuro: Mental Status: Patient is Lethargic but is able to tell me that he is in the hospital, unable to give me the correct month. He is able to follow commands and answer simple questions. No signs of aphasia or neglect Cranial Nerves: II: Visual Fields are full. Pupils are equal, round, and reactive to light.   III,IV, VI: EOMI without ptosis or diploplia.  V: Facial sensation is symmetric to temperature VII: Facial movement is symmetric.  VIII: hearing is intact to voice X: Uvula  elevates symmetrically XI: Shoulder shrug is symmetric. XII: tongue is midline without atrophy or fasciculations.  Motor: He withdraws to noxious stimuli in all 4 extremities Sensory: As above Cerebellar: He does not perform.    I have reviewed labs in epic and the results pertinent to this consultation are: Elevated LFTs  I have reviewed the images obtained: CT head-mild ventriculomegaly  Impression: 60 year old male with waxing and waning lethargy in the setting of multiple sedating medications. I agree that he does need MRI and would favor doing this with and without contrast. I do not think there were any  signs of active infection that would suggest bacterial meningitis. Chronic smoldering infection such as a fungal meningitis could be a consideration, but I would like to get further imaging with MRI first. He does appear to be improving compared to some of the exams tachycardia minute earlier in the night, his leukocytosis and lactic acidosis have resolved.  Recommendations: 1) MRI brain with/without contrast 2) Depakote level though it will be less helpful than if it had been drawn on arrival 3) hold sedating medications such as Risperdal 4) RPR, VPA, HIV  Roland Rack, MD [facility] [phone]  If 7pm- 7am, please page neurology on call as listed in [place].

## 2016-07-02 NOTE — Progress Notes (Signed)
Late entryNew Admission Note:   Arrival Method: stretcher Mental Orientation: lethargic responds to sternal rub Telemetry:16 Assessment: Completed Skin:see assessment [id number] a/c Pain:none Tubes:none Safety Measures: Safety Fall Prevention Plan   Bed alarm Admission: non verbal at [employer] Orientation: Patient has been orientated to the room, unit and staff.  Family:non available  Orders have been reviewed and implemented. Will continue to monitor the patient. Call light has been placed within reach and bed alarm has been activated.   Amado Coe, RN Phone number: [phone]

## 2016-07-02 NOTE — Progress Notes (Signed)
EEG Completed; Results Pending  

## 2016-07-02 NOTE — Care Management Important Message (Signed)
Important Message  Patient Details  Name: [name] MRN: [id number] Date of Birth: 01/02/1956   Medicare Important Message Given:  Yes    Loann Quill 07/02/2016, 11:01 AM

## 2016-07-03 LAB — GLUCOSE, CAPILLARY
GLUCOSE-CAPILLARY: 119 mg/dL — AB (ref 65–99)
Glucose-Capillary: 116 mg/dL — ABNORMAL HIGH (ref 65–99)
Glucose-Capillary: 148 mg/dL — ABNORMAL HIGH (ref 65–99)

## 2016-07-03 MED ORDER — BUPROPION HCL ER (SR) 150 MG PO TB12
150.0000 mg | ORAL_TABLET | Freq: Every day | ORAL | Status: DC
  Administered 2016-07-03 – 2016-07-05 (×3): 150 mg via ORAL
  Filled 2016-07-03 (×4): qty 1

## 2016-07-03 MED ORDER — BENZTROPINE MESYLATE 2 MG PO TABS
2.0000 mg | ORAL_TABLET | Freq: Every day | ORAL | Status: DC
  Administered 2016-07-03: 2 mg via ORAL
  Filled 2016-07-03: qty 1

## 2016-07-03 NOTE — Evaluation (Signed)
Physical Therapy Evaluation Patient Details Name: [name] MRN: [id number] DOB: 1956/07/13 Today's Date: 07/03/2016   History of Present Illness  Patient is a 60 yo male admitted 07/01/16 with AMS, ataxia, and multiple falls.    PMH:  mental health disorder, chronic back pain, DM, PVD, mult back surgeries, h/o falls  Clinical Impression  Patient presents with problems listed below.  Will benefit from acute PT to maximize functional mobility prior to discharge home.  Noted patient with festinating gait pattern, and difficulty with turns causing decreased balance during gait.  Recommend patient use RW at home for balance/safety.  Also recommend f/u HHPT for continued therapy for gait/balance training.    Follow Up Recommendations Home health PT;Supervision - Intermittent    Equipment Recommendations  Rolling walker with 5" wheels    Recommendations for Other Services       Precautions / Restrictions Precautions Precautions: Fall Precaution Comments: h/o falls Restrictions Weight Bearing Restrictions: No      Mobility  Bed Mobility               General bed mobility comments: Patient in recliner  Transfers Overall transfer level: Needs assistance Equipment used: Rolling walker (2 wheeled) Transfers: Sit to/from Stand Sit to Stand: Min guard         General transfer comment: Verbal cues for hand placement and safe use of RW.  Assist for safety/balance during transfers.  Ambulation/Gait Ambulation/Gait assistance: Min assist Ambulation Distance (Feet): 120 Feet Assistive device: Rolling walker (2 wheeled) Gait Pattern/deviations: Step-through pattern;Decreased step length - right;Decreased step length - left;Decreased stride length;Shuffle;Festinating;Trunk flexed Gait velocity: decreased Gait velocity interpretation: Below normal speed for age/gender General Gait Details: Verbal cues for safe use of RW.  Cues to take longer steps.  Patient able to increase step  length for approx 5 steps, then returns to quick, short, festinating steps.  Had patient stop several times because steps were extremely short and quick, causing feet to be behind RW and unsteady gait.  Patient could then restart gait focusing again on longer steps. Provided rhythmic cuing for gait pattern/cadence.  Patient with difficulty during turns, getting "stuck".  Note decreased balance during turns.  Stairs            Wheelchair Mobility    Modified Rankin (Stroke Patients Only)       Balance Overall balance assessment: Needs assistance         Standing balance support: Bilateral upper extremity supported;During functional activity Standing balance-Leahy Scale: Poor Standing balance comment: Festinating gait pattern causes instability during gait                             Pertinent Vitals/Pain Pain Assessment: No/denies pain    Home Living Family/patient expects to be discharged to:: Private residence Living Arrangements: Alone Available Help at Discharge: Personal care attendant;Available PRN/intermittently (3-4 hours/day, 7 days/week per patient) Type of Home: Apartment Home Access: Stairs to enter Entrance Stairs-Rails: None Entrance Stairs-Number of Steps: 2 Home Layout: One level Home Equipment: Walker - 4 wheels;Cane - single point;Shower seat      Prior Function Level of Independence: Independent with assistive device(s);Needs assistance   Gait / Transfers Assistance Needed: Using cane for gait pta.  ADL's / Homemaking Assistance Needed: Aids assists with bathing, meal prep, laundry, and running errands.  Comments: Patient does not drive     Hand Dominance        Extremity/Trunk Assessment  Upper Extremity Assessment: Overall WFL for tasks assessed (Noted resting tremors BUE's)           Lower Extremity Assessment: Generalized weakness (Ataxic movement)         Communication   Communication: No difficulties   Cognition Arousal/Alertness: Awake/alert Behavior During Therapy: WFL for tasks assessed/performed Overall Cognitive Status: Within Functional Limits for tasks assessed (Oriented x4)                      General Comments      Exercises        Assessment/Plan    PT Assessment Patient needs continued PT services  PT Diagnosis Abnormality of gait;Difficulty walking;Generalized weakness   PT Problem List Decreased strength;Decreased activity tolerance;Decreased balance;Decreased mobility;Decreased coordination;Decreased knowledge of use of DME  PT Treatment Interventions DME instruction;Gait training;Functional mobility training;Therapeutic activities;Balance training;Neuromuscular re-education;Patient/family education   PT Goals (Current goals can be found in the Care Plan section) Acute Rehab PT Goals Patient Stated Goal: To go home soon PT Goal Formulation: With patient Time For Goal Achievement: 07/10/16 Potential to Achieve Goals: Good    Frequency Min 3X/week   Barriers to discharge Decreased caregiver support Patient lives alone    Co-evaluation               End of Session Equipment Utilized During Treatment: Gait belt Activity Tolerance: Patient tolerated treatment well Patient left: in chair;with call bell/phone within reach;with chair alarm set Nurse Communication: Mobility status (d/c needs)         Time: [id number] PT Time Calculation (min) (ACUTE ONLY): 24 min   Charges:   PT Evaluation $PT Eval Moderate Complexity: 1 Procedure PT Treatments $Gait Training: 8-22 mins   PT G CodesDespina Pole 08/01/2016, 2:50 PM Carita Pian. Sanjuana Kava, [place] Pager [phone]

## 2016-07-03 NOTE — Progress Notes (Signed)
Subjective: Continue to feel better today. No complaints. Desiring to go home.   Exam: Vitals:   07/02/16 1939 07/03/16 0541  BP: (!) 154/72 (!) 169/78  Pulse: 92 79  Resp: 18 16  Temp: 98.6 F (37 C) 98.1 F (36.7 C)    HEENT-  Normocephalic, no lesions, without obvious abnormality.  Normal external eye and conjunctiva.  Normal TM's bilaterally.  Normal auditory canals and external ears. Normal external nose, mucus membranes and septum.  Normal pharynx. Cardiovascular- S1, S2 normal, pulses palpable throughout   Lungs- chest clear, no wheezing, rales, normal symmetric air entry Abdomen- normal findings: bowel sounds normal Extremities- no edema     Gen: In bed, NAD MS: alert and oriented to place, year and events. No dysarthria or aphasia.  CN: 2-12 intact Motor: 5/5 throughout Sensory: intact throughout with significant bilateral LE neuropathy with loss of vibratory and proprioception.    Pertinent Labs/Diagnostics: EEG shows normal awake and drowsy state with no seizures.   Etta Quill PA-C [facility] [phone]  Impression:   Suspect Syncope in addition, he also has autonomic neuropathy which may be contributing to these spells. He is on a high dose of risperidone which can cause orthostasis is its own right and which can certainly exacerbate orthostasis from other causes. Citalopram may also cause orthostatic hypotension. These medications have been held and patient feels much improved. At this time would recommend his psychiatrist to further evaluate his medications and make appropriate adjustment.    AGAIN-He has no clinical features to support a diagnosis of NPH. While scans show hydrocephalus, this has been stable on MRI compared to scans from one year ago. I do not think this is playing any role in his symptoms as reported. He has nothing to suggest CNS infection based upon reported history so I see no role for LP at this time.      Recommendations: 1) Psych to evaluate medication changes 2) PT evaluation for safety 3) Follow up with his primary neurologist out patient.     07/03/2016, 11:14 AM

## 2016-07-03 NOTE — Progress Notes (Signed)
PROGRESS NOTE                                                                                                                                                                                                             Patient Demographics:    [name], is a 60 y.o. male, DOB - 1955-12-28, YN:[id number]  Admit date - 07/01/2016   Admitting Physician Kelvin Cellar, MD  Outpatient Primary MD for the patient is Philis Fendt, MD  LOS - 2  Chief Complaint  Patient presents with  . Altered Mental Status       Brief Narrative   60 year old male with history of diabetes mellitus, chronic back pain, bipolar disorder on Risperdal, presents with ataxia, altered mental status, seen by neurology for evaluation, MRI brain significant for ventriculomegaly.   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 - Has nothing to suggest CNS infection so no rule for LP as per neurology. - Most likely related to medication effect, as has significant improvement of mental status after holding his multiple antipsychotic medication. - I think this is most likely related to his multiple psychotropic medications including Neurontin, Robaxin, Risperdal, Depakote, Celexa, Wellbutrin , as his mental status significantly improved after holding all his psychotropic medication , and request a psychiatric consult to reassess his medications .  Unsteady gait with multiple falls - PT/OT consult appreciated. - will check orthostsis. - Patient  with known polyneuropathy with chronic L4 and L5 radiculopathy - Normal with unsteady gait in the past, seen by outpatient in neurology, continue with Cogentin  Diabetes mellitus - Continue to hold  oral medication, start on insulin sliding scale  Bipolar disorder - Continue to hold his [employer] medications medication giving his most likely contributing to his unsteady gait.   Code Status : Full  Family Communication  : None at bedside  Disposition Plan  : Pending PT evaluation  Consults  :  Neurosurgery , neurology  Procedures  : None  DVT Prophylaxis  :  Heparin   Lab Results  Component Value Date   PLT 165 07/02/2016    Antibiotics  :    Anti-infectives    None        Objective:   Vitals:   07/02/16 1000 07/02/16 1656 07/02/16 1939 07/03/16 0541  BP: (!) 148/77 (!) 154/80 (!) 154/72 (!) 169/78  Pulse: 88 98 92 79  Resp: 18 18 18 16   Temp: 98.2 F (36.8 C) 98.6 F (37 C) 98.6 F (37 C) 98.1 F (36.7 C)  TempSrc: Oral Oral Oral Oral  SpO2: 98% 99% 99% 97%  Weight:   97.5 kg (214 lb 15.2 oz)   Height:        Wt Readings from Last 3 Encounters:  07/02/16 97.5 kg (214 lb 15.2 oz)  06/01/16 98.2 kg (216 lb 8 oz)  04/10/15 99.2 kg (218 lb 12.8 oz)     Intake/Output Summary (Last 24 hours) at 07/03/16 1407 Last data filed at 07/03/16 0543  Gross per 24 hour  Intake             1220 ml  Output             1875 ml  Net             -655 ml     Physical Exam  Awake Alert, Oriented X 3, more appropriate and coherent today [name].AT,PERRAL Supple Neck,No JVD, No cervical lymphadenopathy appriciated.  Symmetrical Chest wall movement, Good air movement bilaterally, CTAB RRR,No Gallops,Rubs or new Murmurs, No Parasternal Heave +ve B.Sounds, Abd Soft, No tenderness, No organomegaly appriciated, No rebound - guarding or rigidity. No Cyanosis, Clubbing or edema, No new Rash or bruise      Data Review:    CBC  Recent Labs Lab 06/27/16 1406  07/01/16 1626 07/01/16 2258 07/02/16 0225  WBC 8.2 11.4* 8.7 8.8  HGB 12.0* 12.4* 12.5* 11.4*  HCT 35.5* 36.3* 38.2* 34.8*  PLT 158 180 173 165  MCV 93.2 93.6 95.0 95.1  MCH 31.5 32.0 31.1 31.1  MCHC 33.8 34.2 32.7 32.8  RDW 12.3 12.5 12.4 12.3  LYMPHSABS 3.1  --   --   --   MONOABS 0.6  --   --   --   EOSABS 0.2  --   --   --   BASOSABS 0.0  --   --   --     Chemistries   Recent Labs Lab 06/27/16 1406 07/01/16 1626 07/01/16 2258 07/02/16 0225  NA 141 142  --  142  K 3.8 3.7  --  3.7  CL 106 108  --  109  CO2 28 27  --  27  GLUCOSE 76 94  --  95  BUN 9 14  --  10  CREATININE 0.90 0.99 0.97 0.83  CALCIUM 9.2 9.5  --  9.0  AST 27 331*  --  297*  ALT 20 69*  --  65*  ALKPHOS 64 59  --  52  BILITOT 0.9 0.9  --  0.4   ------------------------------------------------------------------------------------------------------------------ No results for input(s): CHOL, HDL, LDLCALC, TRIG, CHOLHDL, LDLDIRECT in the last 72 hours.  Lab Results  Component Value Date   HGBA1C 6.5 (H) 06/15/2014   ------------------------------------------------------------------------------------------------------------------  Recent Labs  07/01/16 2258  TSH 2.644   ------------------------------------------------------------------------------------------------------------------  Recent Labs  07/01/16 2258  VITAMINB12 538  FOLATE 11.9    Coagulation profile No results for input(s): INR, PROTIME in the last 168 hours.  No results for input(s): DDIMER in the last 72 hours.  Cardiac Enzymes No results for input(s): CKMB, TROPONINI, MYOGLOBIN in the last 168 hours.  Invalid input(s): CK ------------------------------------------------------------------------------------------------------------------ No results found for: BNP  Inpatient Medications  Scheduled Meds: . heparin  5,000 Units Subcutaneous Q8H  . insulin aspart  0-9 Units Subcutaneous TID WC   Continuous  Infusions: . sodium chloride 100 mL/hr at 07/02/16 2118   PRN Meds:.acetaminophen **OR** acetaminophen, ondansetron **OR** ondansetron (ZOFRAN) IV  Micro Results Recent Results (from the past 240 hour(s))  Culture, blood (Routine X 2) w Reflex to ID Panel     Status: None (Preliminary result)   Collection Time: 07/01/16 11:09 PM  Result Value Ref Range Status   Specimen Description BLOOD RIGHT ARM  Final   Special Requests IN PEDIATRIC BOTTLE 1CC  Final   Culture NO GROWTH 2 DAYS  Final   Report Status PENDING  Incomplete  Culture, blood (Routine X 2) w Reflex to ID Panel     Status: None (Preliminary result)   Collection Time: 07/01/16 11:17 PM  Result Value Ref Range Status   Specimen Description BLOOD RIGHT HAND  Final   Special Requests BOTTLES DRAWN AEROBIC AND ANAEROBIC 5CC  Final   Culture NO GROWTH 2 DAYS  Final   Report Status PENDING  Incomplete    Radiology Reports Dg Chest 2 View  Result Date: 06/27/2016 CLINICAL DATA:  Lethargy, weakness. EXAM: CHEST  2 VIEW COMPARISON:  06/01/2011 FINDINGS: Mild hyperinflation. Linear atelectasis in the left base. Right lung is clear. Heart is normal size. No effusions or acute bony abnormality. IMPRESSION: Hyperinflation.  Left base atelectasis.  No acute findings. Electronically Signed   By: Rolm Baptise M.D.   On: 06/27/2016 14:22  Dg Lumbar Spine Complete  Result Date: 06/18/2016 CLINICAL DATA:  Fall today.  Low back pain.  Initial encounter. EXAM: LUMBAR SPINE - COMPLETE 4+ VIEW COMPARISON:  05/18/2016 FINDINGS: There is no evidence of lumbar spine fracture. Alignment is normal. Posterior lumbar fusion hardware again seen from levels of L2-L5, with interbody cage at L4-5. Other disc spaces are maintained. Left-sided facet DJD noted at L5-S1. Aortic atherosclerosis noted. IMPRESSION: No acute findings. Stable appearance of lumbar spine fusion. Left L5-S1 facet DJD noted. Aortic atherosclerosis noted. Electronically Signed   By: Earle Gell M.D.   On: 06/18/2016 18:34   Ct Head Wo Contrast  Result Date: 07/01/2016 CLINICAL DATA:  Difficulty walking recently. Lethargy. Altered mental status. EXAM: CT HEAD WITHOUT CONTRAST TECHNIQUE: Contiguous axial images were obtained from the base of the skull through the vertex without intravenous contrast. COMPARISON:  CT head without contrast 06/27/2016 and 06/18/2016. MRI brain 05/03/2015. FINDINGS: Moderate ventricular enlargement is present bilaterally. There is slight progression when compared to previous MRI 05/13/2015 is well is the CT scan 06/18/2016. Periventricular white matter hypoattenuation has also slightly progressed when compared with the recent CT scans. This combination suggests worsening hydrocephalus. No hemorrhage or infarct is present. Remote white matter changes extending into the external capsule bilaterally and remote lacunar infarcts of the basal ganglia are stable. The brainstem and cerebellum are unremarkable. The paranasal sinuses and mastoid air cells are clear. The calvarium is intact. IMPRESSION: Subtle changes of worsening ventricular enlargement and periventricular white matter hypoattenuation suggesting worsening hydrocephalus. Electronically Signed   By: San Morelle M.D.   On: 07/01/2016  17:11   Ct Head Wo Contrast  Result Date: 06/27/2016 CLINICAL DATA:  Lethargy.  Abnormal gait. EXAM: CT HEAD WITHOUT CONTRAST TECHNIQUE: Contiguous axial images were obtained from the base of the skull through the vertex without intravenous contrast. COMPARISON:  06/18/2016; brain MRI - 05/13/2015 FINDINGS: Brain: Similar findings of age advanced atrophy with sulcal prominence and centralized volume loss with commensurate ex vacuo dilatation of the ventricular system, asymmetrically involving the right lateral ventricle. Grossly unchanged rather extensive periventricular hypodensities compatible microvascular ischemic disease. Given background parenchymal abnormalities,  there is no CT evidence of superimposed acute large territory infarct. No intraparenchymal or extra-axial mass or hemorrhage. Vascular: Intracranial atherosclerosis Skull: No displaced calvarial fracture. Sinuses/Orbits: Limited visualization of the paranasal sinuses and mastoid air cells is normal. No air-fluid levels. Other: Regional soft tissues appear normal. IMPRESSION: Similar findings of age advanced atrophy and microvascular ischemic disease without acute intracranial process. Electronically Signed   By: Sandi Mariscal M.D.   On: 06/27/2016 14:30  Ct Cervical Spine Wo Contrast  Result Date: 06/18/2016 CLINICAL DATA:  60 y/o M; patient found lying on the floor, denies loss of consciousness for head injury. EXAM: CT HEAD WITHOUT CONTRAST CT CERVICAL SPINE WITHOUT CONTRAST TECHNIQUE: Multidetector CT imaging of the head and cervical spine was performed following the standard protocol without intravenous contrast. Multiplanar CT image reconstructions of the cervical spine were also generated. COMPARISON:  CT head dated 09/21/2006. MRI of brain dated 05/13/2015. FINDINGS: CT HEAD FINDINGS No evidence of large acute infarct, mass effect, or intracranial hemorrhage. Moderate diffuse volume loss. Nonspecific hypoattenuation in subcortical and periventricular white matter corresponding to T2 FLAIR hyperintense signal abnormality on the prior MRI probably represents chronic microvascular ischemic changes. No extra-axial collection is identified. Ventricles are moderately enlarged borderline for degree of volume loss and increased from 2007. No high attenuation vessel. No displaced skull fracture. Visualized paranasal sinuses and mastoids are clear. Orbits are unremarkable. Extensive calcific atherosclerosis of cavernous internal carotid arteries. CT CERVICAL SPINE FINDINGS Alignment: Straightening of cervical lordosis is likely positional. No listhesis. Bones: Post anterior cervical discectomy and fusion of Celsius 4  through C6. Hardware appears intact. No para hardware related complication. There are endplate degenerative changes and disc space narrowing at C6 through T1. There is mild multilevel facet arthrosis. There is no high-grade bony canal stenosis or foraminal narrowing. Atlantoaxial and atlantooccipital articulations are maintained. No acute fracture is identified. Soft tissues: Biapical scarring of the lungs. Normal thyroid gland. No lymphadenopathy. No prevertebral soft tissue swelling. Visualized pharynx, larynx, and subglottic airway are normal. IMPRESSION: 1. No evidence of large acute infarct, mass effect, or intracranial hemorrhage. 2. Moderate interval increase in size of lateral ventricles in 2007, borderline for degree of volume loss, may represent hydrocephalus. 3. No acute fracture or dislocation of the cervical spine. These results were called by telephone at the time of interpretation on 06/18/2016 at 6:11 pm to Dr. Dorie Rank , who verbally acknowledged these results. Electronically Signed   By: Kristine Garbe M.D.   On: 06/18/2016 18:16   Mr Jeri Cos [id number] Contrast  Result Date: 07/02/2016 CLINICAL DATA:  Altered mental status, diagnosis of normal pressure hydrocephalus. History of diabetes. EXAM: MRI HEAD WITHOUT AND WITH CONTRAST TECHNIQUE: Multiplanar, multiecho pulse sequences of the brain and surrounding structures were obtained without and with intravenous contrast. CONTRAST:  81mL MULTIHANCE GADOBENATE DIMEGLUMINE 529 MG/ML IV SOLN COMPARISON:  CT HEAD July 01, 2016 and CT HEAD June 26, 2016 and MRI of the brain  May 13, 2015 FINDINGS: INTRACRANIAL CONTENTS: No reduced diffusion to suggest acute ischemia. No susceptibility artifact to suggest hemorrhage. Stable moderate to severe ventriculomegaly, anterior recess of third ventricle is 12 mm, unchanged. Disproportionate sulcal effacement at the convexities. Confluent supratentorial and pontine white matter FLAIR T2 hyperintensities similar  to prior imaging. No midline shift, mass effect or masses. No abnormal intracranial enhancement. No suspicious parenchymal signal, masses or mass effect. No abnormal extra-axial fluid collections. No extra-axial masses though, contrast enhanced sequences would be more sensitive. Normal major intracranial vascular flow voids present at skull base. ORBITS: The included ocular globes and orbital contents are non-suspicious. SINUSES: The mastoid air-cells and included paranasal sinuses are well-aerated. SKULL/SOFT TISSUES: No abnormal sellar expansion. No suspicious calvarial bone marrow signal. Craniocervical junction maintained. IMPRESSION: No acute intracranial process. Stable findings of normal pressure hydrocephalus and moderate to severe chronic small vessel ischemic disease. No convincing component of interstitial edema/transependymal flow cerebral spinal fluid. Electronically Signed   By: Elon Alas M.D.   On: 07/02/2016 03:05   Dg Hip Unilat With Pelvis 2-3 Views Left  Result Date: 06/18/2016 CLINICAL DATA:  Fall on lying on floor as patient reports 3 false today. Low back and left hip pain. EXAM: DG HIP (WITH OR WITHOUT PELVIS) 2-3V LEFT COMPARISON:  None. FINDINGS: Examination demonstrates minimal symmetric degenerative change of the hips. No evidence of acute fracture or dislocation. Fusion hardware partially visualized over the lower lumbar spine. IMPRESSION: No acute findings. Electronically Signed   By: Marin Olp M.D.   On: 06/18/2016 18:36   Ct Head Code Stroke W/o Cm  Result Date: 06/18/2016 CLINICAL DATA:  60 y/o M; patient found lying on the floor, denies loss of consciousness for head injury. EXAM: CT HEAD WITHOUT CONTRAST CT CERVICAL SPINE WITHOUT CONTRAST TECHNIQUE: Multidetector CT imaging of the head and cervical spine was performed following the standard protocol without intravenous contrast. Multiplanar CT image reconstructions of the cervical spine were also generated.  COMPARISON:  CT head dated 09/21/2006. MRI of brain dated 05/13/2015. FINDINGS: CT HEAD FINDINGS No evidence of large acute infarct, mass effect, or intracranial hemorrhage. Moderate diffuse volume loss. Nonspecific hypoattenuation in subcortical and periventricular white matter corresponding to T2 FLAIR hyperintense signal abnormality on the prior MRI probably represents chronic microvascular ischemic changes. No extra-axial collection is identified. Ventricles are moderately enlarged borderline for degree of volume loss and increased from 2007. No high attenuation vessel. No displaced skull fracture. Visualized paranasal sinuses and mastoids are clear. Orbits are unremarkable. Extensive calcific atherosclerosis of cavernous internal carotid arteries. CT CERVICAL SPINE FINDINGS Alignment: Straightening of cervical lordosis is likely positional. No listhesis. Bones: Post anterior cervical discectomy and fusion of Celsius 4 through C6. Hardware appears intact. No para hardware related complication. There are endplate degenerative changes and disc space narrowing at C6 through T1. There is mild multilevel facet arthrosis. There is no high-grade bony canal stenosis or foraminal narrowing. Atlantoaxial and atlantooccipital articulations are maintained. No acute fracture is identified. Soft tissues: Biapical scarring of the lungs. Normal thyroid gland. No lymphadenopathy. No prevertebral soft tissue swelling. Visualized pharynx, larynx, and subglottic airway are normal. IMPRESSION: 1. No evidence of large acute infarct, mass effect, or intracranial hemorrhage. 2. Moderate interval increase in size of lateral ventricles in 2007, borderline for degree of volume loss, may represent hydrocephalus. 3. No acute fracture or dislocation of the cervical spine. These results were called by telephone at the time of interpretation on 06/18/2016 at 6:11 pm to Dr. Wille Glaser  KNAPP , who verbally acknowledged these results. Electronically  Signed   By: Kristine Garbe M.D.   On: 06/18/2016 18:16   US Abdomen Limited Ruq  Result Date: 07/02/2016 CLINICAL DATA:  60 year old male with right upper quadrant abdominal pain and elevated LFTs. EXAM: US ABDOMEN LIMITED - RIGHT UPPER QUADRANT COMPARISON:  Right upper quadrant ultrasound dated 03/30/2014 FINDINGS: Gallbladder: There is stones within the gallbladder. No gallbladder wall thickening or pericholecystic fluid. Negative sonographic Murphy's sign. Common bile duct: Diameter: 3 mm Liver: There is mild coarsening of the hepatic echotexture with minimal contour irregularity. Clinical correlation is recommended. IMPRESSION: Cholelithiasis without sonographic evidence acute cholecystitis. Slightly irregular hepatic contour. Electronically Signed   By: Anner Crete M.D.   On: 07/02/2016 03:49     Maanya Hippert M.D on 07/03/2016 at 2:07 PM  Between 7am to 7pm - Pager - [phone]  After 7pm go to [email or web address] - password [facility]  [facility] -  Office  [phone]

## 2016-07-03 NOTE — Clinical Social Work Note (Addendum)
CSW received call from [name], nurse from [place] (ACT TEAM) regarding patient. She indicated that the ACT team has been working with patient since May 2014 and she has been working with him since January 2017. Ms. [name] explained that she follows him regarding medication management and his medical appointment. She  expressed that the ACT team is very concerned about [name] safety at home and his discharge plan. She reported that patient lives alone and falls frequently and his apartment is not handicapped accessible. Their main concerns are his safety and housing. They have been talking with patient regarding alternate housing as assisted living but patient has refused. Ms. [name] also expressed concern about patient medication management at the hospital as on yesterday, patient was only taking insulin and not any of his other meds and they are concerned about [name] not being on his psych meds. CSW informed Ms. [name] that his record will be reviewed and they can be contacted once patient is ready for discharge.  Ms. [name] provided her contact information: [phone] (w) and [phone] (cell).  Lynnex Fulp Givens, MSW, LCSW Licensed Clinical Social Worker [place] [phone]

## 2016-07-03 NOTE — Care Management Note (Signed)
Case Management Note  Patient Details  Name: [name] MRN: [id number] Date of Birth: 11/15/1956  Subjective/Objective:       CM following for progression and d/c planning.              Action/Plan: 07/03/2016 PT recommending HHPT , met with pt who has no preferrance re [facility] services. [facility] selected. Pt has personal care services in the home.. Have requested a rolling walker, pt has a walker however he wishes to obtain a plain rolling walker such as PT uses if his insurance will cover. This has been ordered and MD has entered [facility] orders. [facility] rep , Abby notified of pt need for [facility].   Expected Discharge Date:                  Expected Discharge Plan:  [place]  In-House Referral:  NA  Discharge planning Services  CM Consult  Post Acute Care Choice:  Durable Medical Equipment, Home Health Choice offered to:  Patient  DME Arranged:  Walker rolling DME Agency:  [place] Arranged:  PT, OT [place] Agency:  [facility] (now [facility])  Status of Service:  Completed, signed off  If discussed at [place] of Stay Meetings, dates discussed:    Additional Comments:  Adron Bene, RN 07/03/2016, 3:19 PM

## 2016-07-04 DIAGNOSIS — R4182 Altered mental status, unspecified: Secondary | ICD-10-CM

## 2016-07-04 LAB — GLUCOSE, CAPILLARY
GLUCOSE-CAPILLARY: 209 mg/dL — AB (ref 65–99)
GLUCOSE-CAPILLARY: 84 mg/dL (ref 65–99)
GLUCOSE-CAPILLARY: 146 mg/dL — AB (ref 65–99)
Glucose-Capillary: 161 mg/dL — ABNORMAL HIGH (ref 65–99)

## 2016-07-04 MED ORDER — CITALOPRAM HYDROBROMIDE 20 MG PO TABS
10.0000 mg | ORAL_TABLET | Freq: Every day | ORAL | Status: DC
  Administered 2016-07-04: 10 mg via ORAL
  Filled 2016-07-04: qty 1

## 2016-07-04 MED ORDER — GABAPENTIN 100 MG PO CAPS
200.0000 mg | ORAL_CAPSULE | Freq: Two times a day (BID) | ORAL | Status: DC
  Administered 2016-07-04 – 2016-07-05 (×3): 200 mg via ORAL
  Filled 2016-07-04 (×3): qty 2

## 2016-07-04 MED ORDER — RISPERIDONE 1 MG PO TABS
1.0000 mg | ORAL_TABLET | Freq: Two times a day (BID) | ORAL | Status: DC
  Administered 2016-07-04 – 2016-07-05 (×3): 1 mg via ORAL
  Filled 2016-07-04 (×4): qty 1

## 2016-07-04 MED ORDER — BENZTROPINE MESYLATE 0.5 MG PO TABS
0.5000 mg | ORAL_TABLET | Freq: Two times a day (BID) | ORAL | Status: DC
  Administered 2016-07-04 – 2016-07-05 (×2): 0.5 mg via ORAL
  Filled 2016-07-04 (×3): qty 1

## 2016-07-04 NOTE — Progress Notes (Signed)
Physical Therapy Progress Note  Received call from RN.  Patient is now willing to go to  SNF for continued therapy at discharge.  Patient strongly declined SNF during PT evaluation on 07/03/16.  Would agree that SNF would be most appropriate d/c plan for this patient given multiple falls pta and decreased balance/mobility.  Recommend SNF at d/c for continued therapy for balance/mobility.  Carita Pian Sanjuana Kava, [place] Pager [phone]

## 2016-07-04 NOTE — Consult Note (Signed)
[facility] Consult   Reason for Consult:  Medication adjustment, recurrent falls Referring Physician:  Dr. Conard Novak Patient Identification: [name] MRN:  [id number] Principal Diagnosis: Hydrocephalus in adult Diagnosis:   Patient Active Problem List   Diagnosis Date Noted  . Altered mental status [R41.82] 07/01/2016  . Acute encephalopathy [G93.40] 07/01/2016  . Hydrocephalus in adult [G91.9] 07/01/2016  . Shaking spells [R25.1] 06/01/2016  . Gait disturbance [R26.9] 04/10/2015  . Numbness [R20.0] 04/10/2015  . Lumbar radicular pain [M54.16] 04/10/2015  . Lumbar spondylosis [M47.816] 04/10/2015  . Bipolar I disorder with depression, severe ([place]) [F31.4] 06/30/2014  . Suicidal ideations [R45.851] 06/30/2014  . Ulcer of lower limb, unspecified [L97.909] 02/20/2014  . CELLULITIS AND ABSCESS OF LEG EXCEPT FOOT [L02.419, G4380702 11/08/2007    Total Time spent with patient: 1 hour  Subjective:   [name] is a 60 y.o. male patient admitted with altered mental status.  HPI: Thanks for asking me to do a psych consult on [name],  60 year old male with history of diabetes mellitus, chronic back pain, Bipolar depression on multiple medications for medical and mental issues. Patient states that he was brought to the hospital for evaluation due to unstable gait and recurrent fall ongoing for over 2 months. Patient states that he has been telling his psychiatrist at [facility] that they need to reduce his medications but no one listen to him: ''I told them when I take some of my medications, I immediately starts shaking''. He says that he is feeling better today, since some of his medications were on hold since he came to the hospital. Patient reports long history of psychosis, bipolar and depression. Today, he denies suicidal thoughts, psychosis or delusional thinking. Patient smokes 1/2 pack of cigarette daily but denies drugs and alcohol abuse. MRI done by neurologist  revealed ventriculomegaly.  Past Psychiatric History: as above  Risk to Self: Is patient at risk for suicide?: No Risk to Others:   Prior Inpatient Therapy:   Prior Outpatient Therapy:    Past Medical History:  Past Medical History:  Diagnosis Date  . Chronic back pain   . Diabetes mellitus without complication ([place])   . DVT (deep venous thrombosis) ([place])   . History of colon polyps   . Mental disorder    bipolar  . Peripheral vascular disease ([place])   . Pneumonia    hx of-about 74yrs ago    Past Surgical History:  Procedure Laterality Date  . APPENDECTOMY     early 72's  . BACK SURGERY  05/2011   x 5  . blood clot in groin  early 80's  . CARPAL TUNNEL RELEASE     left  . CERVICAL SPINE SURGERY  2009  . COLONOSCOPY    . HERNIA REPAIR     early 2000's  . SHOULDER SURGERY  70's   right   Family History:  Family History  Problem Relation Age of Onset  . Heart attack Father   . Anesthesia problems Neg Hx   . Hypotension Neg Hx   . Malignant hyperthermia Neg Hx   . Pseudochol deficiency Neg Hx    Family Psychiatric  History: denies by patient Social History:  History  Alcohol Use No     History  Drug Use No    Social History   Social History  . Marital status: Unknown    Spouse name: N/A  . Number of children: N/A  . Years of education: N/A   Social History  Main Topics  . Smoking status: Current Every Day Smoker    Packs/day: 1.00    Years: 40.00    Types: Cigarettes  . Smokeless tobacco: Never Used     Comment: pt states that he is ready to quit smoking but thinks he will need patches advised him to speak with his PCP which he is seaing this Thursday 3/26  . Alcohol use No  . Drug use: No  . Sexual activity: Yes   Other Topics Concern  . None   Social History Narrative  . None   Additional Social History:    Allergies:   Allergies  Allergen Reactions  . Penicillins Hives and Other (See Comments)    Has patient had a PCN reaction causing  immediate rash, facial/tongue/throat swelling, SOB or lightheadedness with hypotension: No Has patient had a PCN reaction causing severe rash involving mucus membranes or skin necrosis: No Has patient had a PCN reaction that required hospitalization No Has patient had a PCN reaction occurring within the last 10 years: No If all of the above answers are "NO", then may proceed with Cephalosporin use.    Labs:  Results for orders placed or performed during the hospital encounter of 07/01/16 (from the past 48 hour(s))  Glucose, capillary     Status: Abnormal   Collection Time: 07/02/16  4:38 PM  Result Value Ref Range   Glucose-Capillary 131 (H) 65 - 99 mg/dL  Glucose, capillary     Status: Abnormal   Collection Time: 07/02/16  7:36 PM  Result Value Ref Range   Glucose-Capillary 163 (H) 65 - 99 mg/dL  Glucose, capillary     Status: Abnormal   Collection Time: 07/03/16  7:54 AM  Result Value Ref Range   Glucose-Capillary 116 (H) 65 - 99 mg/dL  Glucose, capillary     Status: Abnormal   Collection Time: 07/03/16 11:35 AM  Result Value Ref Range   Glucose-Capillary 119 (H) 65 - 99 mg/dL  Glucose, capillary     Status: Abnormal   Collection Time: 07/03/16  4:43 PM  Result Value Ref Range   Glucose-Capillary 148 (H) 65 - 99 mg/dL  Glucose, capillary     Status: Abnormal   Collection Time: 07/04/16  7:52 AM  Result Value Ref Range   Glucose-Capillary 209 (H) 65 - 99 mg/dL  Glucose, capillary     Status: None   Collection Time: 07/04/16 12:20 PM  Result Value Ref Range   Glucose-Capillary 84 65 - 99 mg/dL    Current Facility-Administered Medications  Medication Dose Route Frequency Provider Last Rate Last Dose  . 0.9 %  sodium chloride infusion   Intravenous Continuous Kelvin Cellar, MD 100 mL/hr at 07/02/16 2118    . acetaminophen (TYLENOL) tablet 650 mg  650 mg Oral Q6H PRN Kelvin Cellar, MD       Or  . acetaminophen (TYLENOL) suppository 650 mg  650 mg Rectal Q6H PRN Kelvin Cellar, MD      . benztropine (COGENTIN) tablet 2 mg  2 mg Oral QHS Albertine Patricia, MD   2 mg at 07/03/16 2219  . buPROPion [facility] SR) 12 hr tablet 150 mg  150 mg Oral Daily Albertine Patricia, MD   150 mg at 07/04/16 1122  . heparin injection 5,000 Units  5,000 Units Subcutaneous Q8H Greta Doom, MD   5,000 Units at 07/03/16 2216  . insulin aspart (novoLOG) injection 0-9 Units  0-9 Units Subcutaneous TID WC Albertine Patricia, MD  3 Units at 07/04/16 0903  . ondansetron (ZOFRAN) tablet 4 mg  4 mg Oral Q6H PRN Kelvin Cellar, MD   4 mg at 07/03/16 2215   Or  . ondansetron (ZOFRAN) injection 4 mg  4 mg Intravenous Q6H PRN Kelvin Cellar, MD        Musculoskeletal: Strength & Muscle Tone: within normal limits Gait & Station: unsteady Patient leans: Front  Psychiatric Specialty Exam: Physical Exam  Psychiatric: His behavior is normal. Judgment and thought content normal. His affect is blunt. His speech is delayed. Cognition and memory are normal.    Review of Systems  HENT: Negative.   Eyes: Negative.   Respiratory: Negative.   Cardiovascular: Negative.   Gastrointestinal: Negative.   Genitourinary: Negative.   Skin: Negative.   Neurological: Positive for weakness.  Endo/Heme/Allergies: Negative.   Psychiatric/Behavioral: Negative.     Blood pressure (!) 150/78, pulse 84, temperature 98.7 F (37.1 C), temperature source Oral, resp. rate 18, height 6\' 1"  (1.854 m), weight 97.4 kg (214 lb 12.8 oz), SpO2 98 %.Body mass index is 28.34 kg/m.  General Appearance: Casual  Eye Contact:  Good  Speech:  Slow  Volume:  Decreased  Mood:  Anxious  Affect:  Constricted  Thought Process:  Coherent and Descriptions of Associations: Intact  Orientation:  Full (Time, Place, and Person)  Thought Content:  Logical  Suicidal Thoughts:  No  Homicidal Thoughts:  No  Memory:  Immediate;   Fair Recent;   Good Remote;   Good  Judgement:  Fair  Insight:  Fair  Psychomotor  Activity:  Decreased  Concentration:  Concentration: Fair and Attention Span: Fair  Recall:  Good  Fund of Knowledge:  Good  Language:  Good  Akathisia:  No  Handed:  Right  AIMS (if indicated):     Assets:  Communication Skills Desire for Improvement  ADL's:  Intact  Cognition:  WNL  Sleep:   fair     Treatment Plan Summary: Plan/Recommendations: Will discontinue and decrease some of the patient's medication that can induce dizziness/fall -Decrease Celexa to 10 mg daily-could induce orthostatic -Discontinue Depakote. -Decrease Gabapentin to 200 mg bid  -Change Benztropine to 0.5mg  BID for EPS prevention -Decrease Risperdal to 1 mg bid-could induced EPS/Tremors -Continue Wellbutrin SR 150 mg daily for depression. -Patient is cleared by psych services-does not meet criteria for admission.   Disposition: No evidence of imminent risk to self or others at present.   Patient does not meet criteria for psychiatric inpatient admission. Supportive therapy provided about ongoing stressors. Patient to follow up with [facility] for psychiatric treatment upon discharge.  Corena Pilgrim, MD 07/04/2016 12:41 PM

## 2016-07-04 NOTE — Care Management (Signed)
Discharge planning discussed with Magda Paganini ACT team RN from [facility]. Patient declined SNF placement.  Patient would benefit from rolling walker, [facility] delivered to room. CM anticipate d/c home in the am

## 2016-07-04 NOTE — Progress Notes (Addendum)
PROGRESS NOTE                                                                                                                                                                                                             Patient Demographics:    [name], is a 60 y.o. male, DOB - 02/16/1956, YN:[id number]  Admit date - 07/01/2016   Admitting Physician Kelvin Cellar, MD  Outpatient Primary MD for the patient is Philis Fendt, MD  LOS - 3  Chief Complaint  Patient presents with  . Altered Mental Status       Brief Narrative   60 year old male with history of diabetes mellitus, chronic back pain, bipolar disorder on Risperdal, presents with ataxia, altered mental status, seen by neurology for evaluation, MRI brain significant for ventriculomegaly, Seen by neurology and psychiatry, symptoms thought to be secondary to medication, significant improvement of mental status after holding meds.   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 - Has nothing to suggest CNS infection so no rule for LP as per neurology. - Most likely related to medication effect, as has significant improvement of mental status after holding his multiple antipsychotic medication. - I think this is most likely related to his multiple psychotropic medications including Neurontin, Robaxin, Risperdal, Depakote, Celexa, Wellbutrin , as his mental status significantly improved after holding all his psychotropic medication . - Psychiatric consult  greatly appreciated, recommendation to hold Depakote, decrease Celexa, gabapentin, and Risperdal, and continue with Wellbutrin at current dose .  Unsteady gait with multiple falls - PT/OT consult appreciated. - will check orthostsis. - Patient with known polyneuropathy with chronic L4 and L5 radiculopathy - Normal with unsteady gait in the past, seen by outpatient in neurology, - Cogentin dose was increased  Diabetes mellitus - Continue to hold  oral medication, start on insulin sliding scale  Bipolar disorder - Psychiatric consult greatly appreciated, medication has been adjusted, Risperdal , Celexa and gabapentin dose has been decreased, Wellbutrin dose remains the same, Cogentin dose increased   Code Status : Full  Family Communication  : None at bedside  Disposition Plan  : Schedule patient ACT team, they think patient is unsafe for discharge at home, initially patient refusing to go to the facility, agreeable after he discussed with his ACT team, so requested PT to reevaluate the patient to see if he is an appropriate for subacute rehabilitation.  Consults  :  Neurosurgery , neurology, psychiatry  Procedures  : None  DVT Prophylaxis  :  Heparin   Lab Results  Component Value Date   PLT 165 07/02/2016    Antibiotics  :    Anti-infectives    None        Objective:   Vitals:   07/03/16 1944 07/04/16 0619 07/04/16 0931 07/04/16 0936  BP: (!) 163/37 (!) 150/78    Pulse: 77 84    Resp: 20 18    Temp: 98.3 F (36.8 C) 98.1 F (36.7 C) 98.7 F (37.1 C)   TempSrc: Oral Oral Oral   SpO2: 96% 96%  98%  Weight: 97.4 kg (214 lb 12.8 oz)     Height:        Wt Readings from Last 3 Encounters:  07/03/16 97.4 kg (214 lb 12.8 oz)  06/01/16 98.2 kg (216 lb 8 oz)  04/10/15 99.2 kg (218 lb 12.8 oz)     Intake/Output Summary (Last 24 hours) at 07/04/16 1541 Last data filed at 07/03/16 2155  Gross per 24 hour  Intake                0 ml  Output             1125 ml    Net            -1125 ml     Physical Exam  Awake Alert, Oriented X 3, more appropriate and coherent today [name],PERRAL Supple Neck,No JVD, No cervical lymphadenopathy appriciated.  Symmetrical Chest wall movement, Good air movement bilaterally, CTAB RRR,No Gallops,Rubs or new Murmurs, No Parasternal Heave +ve B.Sounds, Abd Soft, No tenderness, No organomegaly appriciated, No rebound - guarding or rigidity. No Cyanosis, Clubbing or edema, No new Rash or bruise      Data Review:    CBC  Recent Labs Lab 07/01/16 1626 07/01/16 2258 07/02/16 0225  WBC 11.4* 8.7 8.8  HGB 12.4* 12.5* 11.4*  HCT 36.3* 38.2* 34.8*  PLT 180 173 165  MCV 93.6 95.0 95.1  MCH 32.0 31.1 31.1  MCHC 34.2 32.7 32.8  RDW 12.5 12.4 12.3    Chemistries   Recent Labs Lab 07/01/16 1626 07/01/16 2258 07/02/16 0225  NA 142  --  142  K 3.7  --  3.7  CL 108  --  109  CO2 27  --  27  GLUCOSE 94  --  95  BUN 14  --  10  CREATININE 0.99 0.97 0.83  CALCIUM 9.5  --  9.0  AST 331*  --  297*  ALT 69*  --  65*  ALKPHOS 59  --  52  BILITOT 0.9  --  0.4   ------------------------------------------------------------------------------------------------------------------ No results for input(s): CHOL, HDL, LDLCALC, TRIG, CHOLHDL, LDLDIRECT in the last 72 hours.  Lab Results  Component Value Date   HGBA1C 6.5 (H) 06/15/2014   ------------------------------------------------------------------------------------------------------------------  Recent Labs  07/01/16 2258  TSH 2.644   ------------------------------------------------------------------------------------------------------------------  Recent Labs  07/01/16 2258  VITAMINB12 538  FOLATE 11.9    Coagulation profile No results for input(s): INR, PROTIME in the last 168 hours.  No results for input(s): DDIMER in the last 72 hours.  Cardiac Enzymes No results for input(s): CKMB, TROPONINI, MYOGLOBIN in the last 168 hours.  Invalid  input(s): CK ------------------------------------------------------------------------------------------------------------------ No results found for: BNP  Inpatient Medications  Scheduled Meds: . benztropine  0.5 mg Oral BID  . buPROPion  150 mg Oral Daily  . citalopram  10 mg Oral QHS  . gabapentin  200 mg Oral BID  . heparin  5,000 Units Subcutaneous Q8H  . insulin aspart  0-9 Units Subcutaneous TID WC  . risperiDONE  1 mg Oral BID   Continuous Infusions: . sodium chloride 100 mL/hr at 07/02/16 2118   PRN Meds:.acetaminophen **OR** acetaminophen, ondansetron **OR** ondansetron (ZOFRAN) IV  Micro Results Recent Results (from the past 240 hour(s))  Culture, blood (Routine X 2) w Reflex to ID Panel     Status: None (Preliminary result)   Collection Time: 07/01/16 11:09 PM  Result Value Ref Range Status   Specimen Description BLOOD RIGHT ARM  Final   Special Requests IN PEDIATRIC BOTTLE 1CC  Final   Culture NO GROWTH 2 DAYS  Final   Report Status PENDING  Incomplete  Culture, blood (Routine X 2) w Reflex to ID Panel     Status: None (Preliminary result)   Collection Time: 07/01/16 11:17 PM  Result Value Ref Range Status   Specimen Description BLOOD RIGHT HAND  Final   Special Requests BOTTLES DRAWN AEROBIC AND ANAEROBIC 5CC  Final   Culture NO GROWTH 2 DAYS  Final   Report Status PENDING  Incomplete    Radiology Reports Dg Chest 2 View  Result Date: 06/27/2016 CLINICAL DATA:  Lethargy, weakness. EXAM: CHEST  2 VIEW COMPARISON:  06/01/2011 FINDINGS: Mild hyperinflation. Linear atelectasis in the left base. Right lung is clear. Heart is normal size. No effusions or acute bony abnormality. IMPRESSION: Hyperinflation.  Left base atelectasis.  No acute findings. Electronically Signed   By: Rolm Baptise M.D.   On: 06/27/2016 14:22  Dg Lumbar Spine Complete  Result Date: 06/18/2016 CLINICAL DATA:  Fall today.  Low back pain.  Initial encounter. EXAM: LUMBAR SPINE - COMPLETE 4+  VIEW COMPARISON:  05/18/2016 FINDINGS: There is no evidence of lumbar spine fracture. Alignment is normal. Posterior lumbar fusion hardware again seen from levels of L2-L5, with interbody cage at L4-5. Other disc spaces are maintained. Left-sided facet DJD noted at L5-S1. Aortic atherosclerosis noted. IMPRESSION: No acute findings. Stable appearance of lumbar spine fusion. Left L5-S1 facet DJD noted. Aortic atherosclerosis noted. Electronically Signed   By: Earle Gell M.D.   On: 06/18/2016 18:34   Ct Head Wo Contrast  Result Date: 07/01/2016 CLINICAL DATA:  Difficulty walking recently. Lethargy. Altered mental status. EXAM: CT HEAD WITHOUT CONTRAST TECHNIQUE: Contiguous axial images were obtained from the base of the skull through the vertex without intravenous contrast. COMPARISON:  CT head without contrast 06/27/2016 and 06/18/2016. MRI brain 05/03/2015. FINDINGS: Moderate ventricular enlargement is present bilaterally. There is slight progression when compared to previous MRI 05/13/2015 is well is the CT scan 06/18/2016. Periventricular white matter hypoattenuation has also slightly progressed when compared with the recent CT scans. This combination suggests worsening hydrocephalus. No hemorrhage or infarct is present. Remote white matter changes extending into the external capsule bilaterally and remote lacunar infarcts of  the basal ganglia are stable. The brainstem and cerebellum are unremarkable. The paranasal sinuses and mastoid air cells are clear. The calvarium is intact. IMPRESSION: Subtle changes of worsening ventricular enlargement and periventricular white matter hypoattenuation suggesting worsening hydrocephalus. Electronically Signed   By: San Morelle M.D.   On: 07/01/2016 17:11   Ct Head Wo Contrast  Result Date: 06/27/2016 CLINICAL DATA:  Lethargy.  Abnormal gait. EXAM: CT HEAD WITHOUT CONTRAST TECHNIQUE: Contiguous axial images were obtained from the base of the skull through the  vertex without intravenous contrast. COMPARISON:  06/18/2016; brain MRI - 05/13/2015 FINDINGS: Brain: Similar findings of age advanced atrophy with sulcal prominence and centralized volume loss with commensurate ex vacuo dilatation of the ventricular system, asymmetrically involving the right lateral ventricle. Grossly unchanged rather extensive periventricular hypodensities compatible microvascular ischemic disease. Given background parenchymal abnormalities, there is no CT evidence of superimposed acute large territory infarct. No intraparenchymal or extra-axial mass or hemorrhage. Vascular: Intracranial atherosclerosis Skull: No displaced calvarial fracture. Sinuses/Orbits: Limited visualization of the paranasal sinuses and mastoid air cells is normal. No air-fluid levels. Other: Regional soft tissues appear normal. IMPRESSION: Similar findings of age advanced atrophy and microvascular ischemic disease without acute intracranial process. Electronically Signed   By: Sandi Mariscal M.D.   On: 06/27/2016 14:30  Ct Cervical Spine Wo Contrast  Result Date: 06/18/2016 CLINICAL DATA:  60 y/o M; patient found lying on the floor, denies loss of consciousness for head injury. EXAM: CT HEAD WITHOUT CONTRAST CT CERVICAL SPINE WITHOUT CONTRAST TECHNIQUE: Multidetector CT imaging of the head and cervical spine was performed following the standard protocol without intravenous contrast. Multiplanar CT image reconstructions of the cervical spine were also generated. COMPARISON:  CT head dated 09/21/2006. MRI of brain dated 05/13/2015. FINDINGS: CT HEAD FINDINGS No evidence of large acute infarct, mass effect, or intracranial hemorrhage. Moderate diffuse volume loss. Nonspecific hypoattenuation in subcortical and periventricular white matter corresponding to T2 FLAIR hyperintense signal abnormality on the prior MRI probably represents chronic microvascular ischemic changes. No extra-axial collection is identified. Ventricles are  moderately enlarged borderline for degree of volume loss and increased from 2007. No high attenuation vessel. No displaced skull fracture. Visualized paranasal sinuses and mastoids are clear. Orbits are unremarkable. Extensive calcific atherosclerosis of cavernous internal carotid arteries. CT CERVICAL SPINE FINDINGS Alignment: Straightening of cervical lordosis is likely positional. No listhesis. Bones: Post anterior cervical discectomy and fusion of Celsius 4 through C6. Hardware appears intact. No para hardware related complication. There are endplate degenerative changes and disc space narrowing at C6 through T1. There is mild multilevel facet arthrosis. There is no high-grade bony canal stenosis or foraminal narrowing. Atlantoaxial and atlantooccipital articulations are maintained. No acute fracture is identified. Soft tissues: Biapical scarring of the lungs. Normal thyroid gland. No lymphadenopathy. No prevertebral soft tissue swelling. Visualized pharynx, larynx, and subglottic airway are normal. IMPRESSION: 1. No evidence of large acute infarct, mass effect, or intracranial hemorrhage. 2. Moderate interval increase in size of lateral ventricles in 2007, borderline for degree of volume loss, may represent hydrocephalus. 3. No acute fracture or dislocation of the cervical spine. These results were called by telephone at the time of interpretation on 06/18/2016 at 6:11 pm to Dr. Dorie Rank , who verbally acknowledged these results. Electronically Signed   By: Kristine Garbe M.D.   On: 06/18/2016 18:16   Mr Jeri Cos [id number] Contrast  Result Date: 07/02/2016 CLINICAL DATA:  Altered mental status, diagnosis of normal pressure hydrocephalus. History of diabetes. EXAM:  MRI HEAD WITHOUT AND WITH CONTRAST TECHNIQUE: Multiplanar, multiecho pulse sequences of the brain and surrounding structures were obtained without and with intravenous contrast. CONTRAST:  31mL MULTIHANCE GADOBENATE DIMEGLUMINE 529 MG/ML IV SOLN  COMPARISON:  CT HEAD July 01, 2016 and CT HEAD June 26, 2016 and MRI of the brain May 13, 2015 FINDINGS: INTRACRANIAL CONTENTS: No reduced diffusion to suggest acute ischemia. No susceptibility artifact to suggest hemorrhage. Stable moderate to severe ventriculomegaly, anterior recess of third ventricle is 12 mm, unchanged. Disproportionate sulcal effacement at the convexities. Confluent supratentorial and pontine white matter FLAIR T2 hyperintensities similar to prior imaging. No midline shift, mass effect or masses. No abnormal intracranial enhancement. No suspicious parenchymal signal, masses or mass effect. No abnormal extra-axial fluid collections. No extra-axial masses though, contrast enhanced sequences would be more sensitive. Normal major intracranial vascular flow voids present at skull base. ORBITS: The included ocular globes and orbital contents are non-suspicious. SINUSES: The mastoid air-cells and included paranasal sinuses are well-aerated. SKULL/SOFT TISSUES: No abnormal sellar expansion. No suspicious calvarial bone marrow signal. Craniocervical junction maintained. IMPRESSION: No acute intracranial process. Stable findings of normal pressure hydrocephalus and moderate to severe chronic small vessel ischemic disease. No convincing component of interstitial edema/transependymal flow cerebral spinal fluid. Electronically Signed   By: Elon Alas M.D.   On: 07/02/2016 03:05   Dg Hip Unilat With Pelvis 2-3 Views Left  Result Date: 06/18/2016 CLINICAL DATA:  Fall on lying on floor as patient reports 3 false today. Low back and left hip pain. EXAM: DG HIP (WITH OR WITHOUT PELVIS) 2-3V LEFT COMPARISON:  None. FINDINGS: Examination demonstrates minimal symmetric degenerative change of the hips. No evidence of acute fracture or dislocation. Fusion hardware partially visualized over the lower lumbar spine. IMPRESSION: No acute findings. Electronically Signed   By: Marin Olp M.D.   On:  06/18/2016 18:36   Ct Head Code Stroke W/o Cm  Result Date: 06/18/2016 CLINICAL DATA:  60 y/o M; patient found lying on the floor, denies loss of consciousness for head injury. EXAM: CT HEAD WITHOUT CONTRAST CT CERVICAL SPINE WITHOUT CONTRAST TECHNIQUE: Multidetector CT imaging of the head and cervical spine was performed following the standard protocol without intravenous contrast. Multiplanar CT image reconstructions of the cervical spine were also generated. COMPARISON:  CT head dated 09/21/2006. MRI of brain dated 05/13/2015. FINDINGS: CT HEAD FINDINGS No evidence of large acute infarct, mass effect, or intracranial hemorrhage. Moderate diffuse volume loss. Nonspecific hypoattenuation in subcortical and periventricular white matter corresponding to T2 FLAIR hyperintense signal abnormality on the prior MRI probably represents chronic microvascular ischemic changes. No extra-axial collection is identified. Ventricles are moderately enlarged borderline for degree of volume loss and increased from 2007. No high attenuation vessel. No displaced skull fracture. Visualized paranasal sinuses and mastoids are clear. Orbits are unremarkable. Extensive calcific atherosclerosis of cavernous internal carotid arteries. CT CERVICAL SPINE FINDINGS Alignment: Straightening of cervical lordosis is likely positional. No listhesis. Bones: Post anterior cervical discectomy and fusion of Celsius 4 through C6. Hardware appears intact. No para hardware related complication. There are endplate degenerative changes and disc space narrowing at C6 through T1. There is mild multilevel facet arthrosis. There is no high-grade bony canal stenosis or foraminal narrowing. Atlantoaxial and atlantooccipital articulations are maintained. No acute fracture is identified. Soft tissues: Biapical scarring of the lungs. Normal thyroid gland. No lymphadenopathy. No prevertebral soft tissue swelling. Visualized pharynx, larynx, and subglottic airway  are normal. IMPRESSION: 1. No evidence of large acute infarct, mass  effect, or intracranial hemorrhage. 2. Moderate interval increase in size of lateral ventricles in 2007, borderline for degree of volume loss, may represent hydrocephalus. 3. No acute fracture or dislocation of the cervical spine. These results were called by telephone at the time of interpretation on 06/18/2016 at 6:11 pm to Dr. Dorie Rank , who verbally acknowledged these results. Electronically Signed   By: Kristine Garbe M.D.   On: 06/18/2016 18:16   US Abdomen Limited Ruq  Result Date: 07/02/2016 CLINICAL DATA:  60 year old male with right upper quadrant abdominal pain and elevated LFTs. EXAM: US ABDOMEN LIMITED - RIGHT UPPER QUADRANT COMPARISON:  Right upper quadrant ultrasound dated 03/30/2014 FINDINGS: Gallbladder: There is stones within the gallbladder. No gallbladder wall thickening or pericholecystic fluid. Negative sonographic Murphy's sign. Common bile duct: Diameter: 3 mm Liver: There is mild coarsening of the hepatic echotexture with minimal contour irregularity. Clinical correlation is recommended. IMPRESSION: Cholelithiasis without sonographic evidence acute cholecystitis. Slightly irregular hepatic contour. Electronically Signed   By: Anner Crete M.D.   On: 07/02/2016 03:49     Vira Chaplin M.D on 07/04/2016 at 3:41 PM  Between 7am to 7pm - Pager - [phone]  After 7pm go to [email or web address] - password [facility]  [facility] -  Office  [phone]

## 2016-07-05 LAB — GLUCOSE, CAPILLARY
GLUCOSE-CAPILLARY: 133 mg/dL — AB (ref 65–99)
GLUCOSE-CAPILLARY: 112 mg/dL — AB (ref 65–99)
GLUCOSE-CAPILLARY: 126 mg/dL — AB (ref 65–99)

## 2016-07-05 MED ORDER — RISPERIDONE 1 MG PO TABS: 1.0000 mg | ORAL_TABLET | Freq: Two times a day (BID) | ORAL | 0 refills | Status: DC

## 2016-07-05 MED ORDER — GABAPENTIN 100 MG PO CAPS: 200.0000 mg | ORAL_CAPSULE | Freq: Two times a day (BID) | ORAL | 0 refills | Status: DC

## 2016-07-05 MED ORDER — CITALOPRAM HYDROBROMIDE 10 MG PO TABS: 10.0000 mg | ORAL_TABLET | Freq: Every day | ORAL | 0 refills | Status: DC

## 2016-07-05 MED ORDER — BENZTROPINE MESYLATE 0.5 MG PO TABS: 0.5000 mg | ORAL_TABLET | Freq: Two times a day (BID) | ORAL | 0 refills | Status: DC

## 2016-07-05 NOTE — Discharge Summary (Signed)
[name], is a 60 y.o. male  DOB 1956/07/18  MRN [id number].  Admission date:  07/01/2016  Admitting Physician  Kelvin Cellar, MD  Discharge Date:  07/05/2016   Primary MD  Philis Fendt, MD  Recommendations for primary care physician for things to follow:  -  to start PT and OT as an outpatient - This check CBC, BMP during next visit - Need to follow with his primary psychiatric as an outpatient regarding further recommendations regarding medications - Patient to follow with his primary neurologist as an outpatient regarding further workup for hydrocephalus   Admission Diagnosis  Altered mental status, unspecified altered mental status type [R41.82]   Discharge Diagnosis  Altered mental status, unspecified altered mental status type [R41.82]    Principal Problem:   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 Chronic back pain   . Diabetes mellitus without complication ([place])   . DVT (deep venous thrombosis) ([place])   . History of colon polyps   . Mental disorder    bipolar  . Peripheral vascular disease ([place])   . Pneumonia    hx of-about 51yrs ago    Past Surgical History:  Procedure Laterality Date  . APPENDECTOMY     early 23's  . BACK SURGERY  05/2011   x 5  . blood clot in groin  early 80's  . CARPAL TUNNEL RELEASE     left  . CERVICAL SPINE SURGERY  2009  . COLONOSCOPY    . HERNIA REPAIR     early 2000's  . SHOULDER SURGERY  70's   right       History of present illness and  Hospital Course:     Kindly see H&P for history of present illness and admission details, please review complete Labs, Consult reports and Test reports for all details in brief  HPI  from the history and physical done on the day of admission 07/01/2016  HPI: [name] is a 59  y.o. male with medical history significant of diabetes mellitus, chronic back pain, having several emergency room visits over the past month for recurrent falls presenting to the emergency room with complaints of ongoing falls and mental status changes. He was brought to the ER by EMS. He was [facility] where he underwent EEG. Emergency room provider reporting that C NA found patient lying on floor and called EMS. He had been ambulating earlier the day tolerating by mouth intake. On the field he had negative focal neurological deficits and was able to follow commands. In the emergency department patient is unable to provide history.   ED Course: In the emergency department he is obtunded, unable to participate in a neurologic exam or provide a history. A CT scan of the brain showed possibly worsening ventricular enlargement and periventricular white matter hypoattenuation consistent with hydrocephalus. Emergency  room provider consulted Dr. Saintclair Halsted of neurosurgery who recommended a neurology consultation and transferred to [facility] for further workup and evaluation. I discussed case with Dr Shon Hale of Neurology who agreed with patient's transfer to [facility] where he will be evaluated by neurology this evening.    Hospital Course  60 year old male with history of diabetes mellitus, chronic back pain, bipolar disorder on Risperdal, presents with ataxia, altered mental status, seen by neurology for evaluation, MRI brain significant for ventriculomegaly, Seen by neurology and psychiatry, symptoms thought to be secondary to medication, significant improvement of mental status after holding meds.  Altered mental status  - Neuro consult appreciated, MRI brain significant for hydrocephalus , it seems to be stable compared to scan one year ago , no further indication for workup as an inpatient for his hydrocephalus per neuro, to follow with his primary  neurologist as an outpatient. - Has nothing to suggest CNS infection so no rule for LP as per neurology. - Most likely related to medication effect, as has significant improvement of mental status after holding his multiple antipsychotic medication. - I think this is most likely related to his multiple psychotropic medications including Neurontin, Robaxin, Risperdal, Depakote, Celexa, Wellbutrin , as his mental status significantly improved after holding all his psychotropic medication . - Psychiatric consult greatly appreciated, recommendation to stop Depakote, decrease Celexa, gabapentin, and Risperdal, and Cogentin and continue with Wellbutrin at current dose .   Unsteady gait with multiple falls - PT/OT consult appreciated, will have PT/OT follow continue following as an outpatient. 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     Discharge Condition:  Stable Patient was offered placement at the facility for subacute rehabilitation, and he declined multiple times.   Follow UP  Follow-up Information    Philis Fendt, MD. Schedule an appointment as soon as possible for a visit in 1 week(s).   Specialty:  Internal Medicine Contact information: [address] [place] [address] [phone]             Discharge Instructions  and  Discharge Medications     Discharge Instructions    Discharge instructions    Complete by:  As directed   Follow with Primary MD Philis Fendt, MD in 7 days   Get CBC,  CMP,  checked  by Primary MD next visit.    Activity: As tolerated with Full fall precautions use walker/cane & assistance as needed   Disposition Home    Diet: Heart Healthy, carbohydrate modified  , with feeding assistance and aspiration precautions.  For Heart failure patients - Check your Weight same time everyday, if you gain over 2 pounds, or you develop in leg swelling, experience more shortness of breath or chest pain, call your Primary MD immediately. Follow Cardiac Low Salt Diet and 1.5 lit/day fluid restriction.   On your next visit with your primary care physician please Get Medicines reviewed and adjusted.   Please request your Prim.MD to go over all Hospital Tests and Procedure/Radiological results at the follow up, please get all [facility]  records sent to your Prim MD by signing hospital release before you go home.   If you experience worsening of your admission symptoms, develop shortness of breath, life threatening emergency, suicidal or homicidal thoughts you must seek medical attention immediately by calling 911 or calling your MD immediately  if symptoms less severe.  You Must read complete instructions/literature along with all the possible adverse reactions/side effects for all the Medicines you take and that have been prescribed to you. Take any new Medicines after you have completely understood and accpet all the possible adverse reactions/side effects.   Do not drive, operating heavy machinery, perform activities at heights, swimming or participation in water activities or provide baby sitting services if your were admitted for syncope or siezures until you have seen by Primary MD or a Neurologist and advised to do so again.  Do not drive when taking Pain medications.    Do not take more than prescribed Pain, Sleep and Anxiety Medications  Special Instructions: If you have smoked or chewed Tobacco  in the last 2 yrs please stop smoking, stop any regular Alcohol   and or any Recreational drug use.  Wear Seat belts while driving.   Please note  You were cared for by a hospitalist during your hospital stay. If you have any questions about your discharge medications or the care you received while you were in the hospital after you are discharged, you can call the unit and asked to speak with the hospitalist on call if the hospitalist that took care of you is not available. Once you are discharged, your primary care physician will handle any further medical issues. Please note that NO REFILLS for any discharge medications will be authorized once you are discharged, as it is imperative that you return to your primary care physician (or establish a relationship with a primary care physician if you do not have one) for your aftercare needs so that they can reassess your need for medications and monitor your lab values.   Increase activity slowly    Complete by:  As directed       Medication List    STOP taking these medications   divalproex 500 MG 24 hr tablet Commonly known as:  DEPAKOTE ER   methocarbamol 750 MG tablet Commonly known as:  ROBAXIN   naproxen 500 MG tablet Commonly known as:  NAPROSYN     TAKE these medications   benztropine 0.5 MG tablet Commonly known as:  COGENTIN Take 1 tablet (0.5 mg total) by mouth 2 (two) times daily. What changed:  medication strength  how much to take  when to take this   buPROPion 150 MG 12 hr tablet Commonly known as:  WELLBUTRIN SR Take 150 mg by mouth daily.   cholecalciferol 1000 units tablet Commonly known as:  VITAMIN D Take 1,000 Units by mouth daily.   citalopram 10 MG tablet Commonly known as:  CELEXA Take 1 tablet (10 mg total) by mouth at bedtime. What changed:  medication strength  how much to take  when to take this  additional instructions  Another medication with the same name was removed. Continue taking this medication, and follow the directions you see here.     gabapentin 100 MG capsule Commonly known as:  NEURONTIN Take 2 capsules (200 mg total) by mouth 2 (two) times daily. What changed:  medication strength  how much to take  when to take this   metFORMIN 500 MG tablet Commonly known as:  GLUCOPHAGE Take 500 mg by mouth 2 (two) times daily with a meal.   risperiDONE 1 MG tablet Commonly known as:  RISPERDAL Take 1 tablet (1 mg total) by mouth 2 (two) times daily. What changed:  medication strength  how much to take   sitaGLIPtin 100 MG tablet Commonly known as:  JANUVIA Take 100 mg by mouth daily with breakfast.   tamsulosin 0.4 MG Caps capsule Commonly known as:  FLOMAX Take 0.4 mg by mouth daily after supper.         Diet and Activity recommendation: See Discharge Instructions above   Consults obtained -  Neurosurgery next Neurology Psychiatry   Major procedures and Radiology Reports - PLEASE review detailed and final reports for all details, in brief -      Dg Chest 2 View  Result Date: 06/27/2016 CLINICAL DATA:  Lethargy, weakness. EXAM: CHEST  2 VIEW COMPARISON:  06/01/2011 FINDINGS: Mild hyperinflation. Linear atelectasis in the left base. Right lung is clear. Heart is normal size. No effusions or acute bony abnormality. IMPRESSION: Hyperinflation.  Left base atelectasis.  No acute findings. Electronically Signed   By: Rolm Baptise M.D.   On: 06/27/2016 14:22  Dg Lumbar Spine Complete  Result Date: 06/18/2016 CLINICAL DATA:  Fall today.  Low back pain.  Initial encounter. EXAM: LUMBAR SPINE - COMPLETE 4+ VIEW COMPARISON:  05/18/2016 FINDINGS: There is no evidence of lumbar spine fracture. Alignment is normal. Posterior lumbar fusion hardware again seen from levels of L2-L5, with interbody cage at L4-5. Other disc spaces are maintained. Left-sided facet DJD noted at L5-S1. Aortic atherosclerosis noted. IMPRESSION: No acute findings. Stable appearance of lumbar spine fusion. Left L5-S1 facet DJD noted.  Aortic atherosclerosis noted. Electronically Signed   By: Earle Gell M.D.   On: 06/18/2016 18:34   Ct Head Wo Contrast  Result Date: 07/01/2016 CLINICAL DATA:  Difficulty walking recently. Lethargy. Altered mental status. EXAM: CT HEAD WITHOUT CONTRAST TECHNIQUE: Contiguous axial images were obtained from the base of the skull through the vertex without intravenous contrast. COMPARISON:  CT head without contrast 06/27/2016 and 06/18/2016. MRI brain 05/03/2015. FINDINGS: Moderate ventricular enlargement is present bilaterally. There is slight progression when compared to previous MRI 05/13/2015 is well is the CT scan 06/18/2016. Periventricular white matter hypoattenuation has also slightly progressed when compared with the recent CT scans. This combination suggests worsening hydrocephalus. No hemorrhage or infarct is present. Remote white matter changes extending into the external capsule bilaterally and remote lacunar infarcts of the basal ganglia are stable. The brainstem and cerebellum are unremarkable. The paranasal sinuses and mastoid air cells are clear. The calvarium is intact. IMPRESSION: Subtle changes of worsening ventricular enlargement and periventricular white matter hypoattenuation suggesting worsening hydrocephalus. Electronically Signed   By: San Morelle M.D.   On: 07/01/2016 17:11   Ct Head Wo Contrast  Result Date: 06/27/2016 CLINICAL DATA:  Lethargy.  Abnormal gait. EXAM: CT HEAD WITHOUT CONTRAST TECHNIQUE: Contiguous axial images were obtained from the base of the skull through the vertex without intravenous contrast. COMPARISON:  06/18/2016; brain MRI - 05/13/2015 FINDINGS: Brain: Similar findings of age advanced atrophy with sulcal prominence and centralized volume loss with commensurate ex vacuo dilatation of the ventricular system, asymmetrically involving the right lateral ventricle. Grossly unchanged rather extensive periventricular hypodensities compatible microvascular  ischemic disease. Given background parenchymal abnormalities, there is no CT evidence of superimposed acute large territory infarct. No intraparenchymal or extra-axial mass or hemorrhage. Vascular: Intracranial atherosclerosis Skull: No displaced calvarial fracture.  Sinuses/Orbits: Limited visualization of the paranasal sinuses and mastoid air cells is normal. No air-fluid levels. Other: Regional soft tissues appear normal. IMPRESSION: Similar findings of age advanced atrophy and microvascular ischemic disease without acute intracranial process. Electronically Signed   By: Sandi Mariscal M.D.   On: 06/27/2016 14:30  Ct Cervical Spine Wo Contrast  Result Date: 06/18/2016 CLINICAL DATA:  60 y/o M; patient found lying on the floor, denies loss of consciousness for head injury. EXAM: CT HEAD WITHOUT CONTRAST CT CERVICAL SPINE WITHOUT CONTRAST TECHNIQUE: Multidetector CT imaging of the head and cervical spine was performed following the standard protocol without intravenous contrast. Multiplanar CT image reconstructions of the cervical spine were also generated. COMPARISON:  CT head dated 09/21/2006. MRI of brain dated 05/13/2015. FINDINGS: CT HEAD FINDINGS No evidence of large acute infarct, mass effect, or intracranial hemorrhage. Moderate diffuse volume loss. Nonspecific hypoattenuation in subcortical and periventricular white matter corresponding to T2 FLAIR hyperintense signal abnormality on the prior MRI probably represents chronic microvascular ischemic changes. No extra-axial collection is identified. Ventricles are moderately enlarged borderline for degree of volume loss and increased from 2007. No high attenuation vessel. No displaced skull fracture. Visualized paranasal sinuses and mastoids are clear. Orbits are unremarkable. Extensive calcific atherosclerosis of cavernous internal carotid arteries. CT CERVICAL SPINE FINDINGS Alignment: Straightening of cervical lordosis is likely positional. No listhesis.  Bones: Post anterior cervical discectomy and fusion of Celsius 4 through C6. Hardware appears intact. No para hardware related complication. There are endplate degenerative changes and disc space narrowing at C6 through T1. There is mild multilevel facet arthrosis. There is no high-grade bony canal stenosis or foraminal narrowing. Atlantoaxial and atlantooccipital articulations are maintained. No acute fracture is identified. Soft tissues: Biapical scarring of the lungs. Normal thyroid gland. No lymphadenopathy. No prevertebral soft tissue swelling. Visualized pharynx, larynx, and subglottic airway are normal. IMPRESSION: 1. No evidence of large acute infarct, mass effect, or intracranial hemorrhage. 2. Moderate interval increase in size of lateral ventricles in 2007, borderline for degree of volume loss, may represent hydrocephalus. 3. No acute fracture or dislocation of the cervical spine. These results were called by telephone at the time of interpretation on 06/18/2016 at 6:11 pm to Dr. Dorie Rank , who verbally acknowledged these results. Electronically Signed   By: Kristine Garbe M.D.   On: 06/18/2016 18:16   Mr Jeri Cos [id number] Contrast  Result Date: 07/02/2016 CLINICAL DATA:  Altered mental status, diagnosis of normal pressure hydrocephalus. History of diabetes. EXAM: MRI HEAD WITHOUT AND WITH CONTRAST TECHNIQUE: Multiplanar, multiecho pulse sequences of the brain and surrounding structures were obtained without and with intravenous contrast. CONTRAST:  62mL MULTIHANCE GADOBENATE DIMEGLUMINE 529 MG/ML IV SOLN COMPARISON:  CT HEAD July 01, 2016 and CT HEAD June 26, 2016 and MRI of the brain May 13, 2015 FINDINGS: INTRACRANIAL CONTENTS: No reduced diffusion to suggest acute ischemia. No susceptibility artifact to suggest hemorrhage. Stable moderate to severe ventriculomegaly, anterior recess of third ventricle is 12 mm, unchanged. Disproportionate sulcal effacement at the convexities. Confluent  supratentorial and pontine white matter FLAIR T2 hyperintensities similar to prior imaging. No midline shift, mass effect or masses. No abnormal intracranial enhancement. No suspicious parenchymal signal, masses or mass effect. No abnormal extra-axial fluid collections. No extra-axial masses though, contrast enhanced sequences would be more sensitive. Normal major intracranial vascular flow voids present at skull base. ORBITS: The included ocular globes and orbital contents are non-suspicious. SINUSES: The mastoid air-cells and included paranasal sinuses are well-aerated. SKULL/SOFT  TISSUES: No abnormal sellar expansion. No suspicious calvarial bone marrow signal. Craniocervical junction maintained. IMPRESSION: No acute intracranial process. Stable findings of normal pressure hydrocephalus and moderate to severe chronic small vessel ischemic disease. No convincing component of interstitial edema/transependymal flow cerebral spinal fluid. Electronically Signed   By: Elon Alas M.D.   On: 07/02/2016 03:05   Dg Hip Unilat With Pelvis 2-3 Views Left  Result Date: 06/18/2016 CLINICAL DATA:  Fall on lying on floor as patient reports 3 false today. Low back and left hip pain. EXAM: DG HIP (WITH OR WITHOUT PELVIS) 2-3V LEFT COMPARISON:  None. FINDINGS: Examination demonstrates minimal symmetric degenerative change of the hips. No evidence of acute fracture or dislocation. Fusion hardware partially visualized over the lower lumbar spine. IMPRESSION: No acute findings. Electronically Signed   By: Marin Olp M.D.   On: 06/18/2016 18:36   Ct Head Code Stroke W/o Cm  Result Date: 06/18/2016 CLINICAL DATA:  60 y/o M; patient found lying on the floor, denies loss of consciousness for head injury. EXAM: CT HEAD WITHOUT CONTRAST CT CERVICAL SPINE WITHOUT CONTRAST TECHNIQUE: Multidetector CT imaging of the head and cervical spine was performed following the standard protocol without intravenous contrast. Multiplanar  CT image reconstructions of the cervical spine were also generated. COMPARISON:  CT head dated 09/21/2006. MRI of brain dated 05/13/2015. FINDINGS: CT HEAD FINDINGS No evidence of large acute infarct, mass effect, or intracranial hemorrhage. Moderate diffuse volume loss. Nonspecific hypoattenuation in subcortical and periventricular white matter corresponding to T2 FLAIR hyperintense signal abnormality on the prior MRI probably represents chronic microvascular ischemic changes. No extra-axial collection is identified. Ventricles are moderately enlarged borderline for degree of volume loss and increased from 2007. No high attenuation vessel. No displaced skull fracture. Visualized paranasal sinuses and mastoids are clear. Orbits are unremarkable. Extensive calcific atherosclerosis of cavernous internal carotid arteries. CT CERVICAL SPINE FINDINGS Alignment: Straightening of cervical lordosis is likely positional. No listhesis. Bones: Post anterior cervical discectomy and fusion of Celsius 4 through C6. Hardware appears intact. No para hardware related complication. There are endplate degenerative changes and disc space narrowing at C6 through T1. There is mild multilevel facet arthrosis. There is no high-grade bony canal stenosis or foraminal narrowing. Atlantoaxial and atlantooccipital articulations are maintained. No acute fracture is identified. Soft tissues: Biapical scarring of the lungs. Normal thyroid gland. No lymphadenopathy. No prevertebral soft tissue swelling. Visualized pharynx, larynx, and subglottic airway are normal. IMPRESSION: 1. No evidence of large acute infarct, mass effect, or intracranial hemorrhage. 2. Moderate interval increase in size of lateral ventricles in 2007, borderline for degree of volume loss, may represent hydrocephalus. 3. No acute fracture or dislocation of the cervical spine. These results were called by telephone at the time of interpretation on 06/18/2016 at 6:11 pm to Dr. Dorie Rank , who verbally acknowledged these results. Electronically Signed   By: Kristine Garbe M.D.   On: 06/18/2016 18:16   US Abdomen Limited Ruq  Result Date: 07/02/2016 CLINICAL DATA:  60 year old male with right upper quadrant abdominal pain and elevated LFTs. EXAM: US ABDOMEN LIMITED - RIGHT UPPER QUADRANT COMPARISON:  Right upper quadrant ultrasound dated 03/30/2014 FINDINGS: Gallbladder: There is stones within the gallbladder. No gallbladder wall thickening or pericholecystic fluid. Negative sonographic Murphy's sign. Common bile duct: Diameter: 3 mm Liver: There is mild coarsening of the hepatic echotexture with minimal contour irregularity. Clinical correlation is recommended. IMPRESSION: Cholelithiasis without sonographic evidence acute cholecystitis. Slightly irregular hepatic contour. Electronically Signed  By: Anner Crete M.D.   On: 07/02/2016 03:49    Micro Results   Recent Results (from the past 240 hour(s))  Culture, blood (Routine X 2) w Reflex to ID Panel     Status: None (Preliminary result)   Collection Time: 07/01/16 11:09 PM  Result Value Ref Range Status   Specimen Description BLOOD RIGHT ARM  Final   Special Requests IN PEDIATRIC BOTTLE 1CC  Final   Culture NO GROWTH 3 DAYS  Final   Report Status PENDING  Incomplete  Culture, blood (Routine X 2) w Reflex to ID Panel     Status: None (Preliminary result)   Collection Time: 07/01/16 11:17 PM  Result Value Ref Range Status   Specimen Description BLOOD RIGHT HAND  Final   Special Requests BOTTLES DRAWN AEROBIC AND ANAEROBIC 5CC  Final   Culture NO GROWTH 3 DAYS  Final   Report Status PENDING  Incomplete       Today   Subjective:   [name] today has no headache,no chest  or abdominal pain,, feels much better today.   Objective:   Blood pressure (!) 163/81, pulse 72, temperature 98.5 F (36.9 C), temperature source Oral, resp. rate 18, height 6\' 1"  (1.854 m), weight 97.7 kg (215 lb 4.8 oz),  SpO2 96 %.   Intake/Output Summary (Last 24 hours) at 07/05/16 1318 Last data filed at 07/05/16 0200  Gross per 24 hour  Intake              480 ml  Output              550 ml  Net              -70 ml    Exam Awake Alert, Oriented x 3, No new F.N deficits, Normal affect,Coherent, appropriate [place].AT,PERRAL Supple Neck,No JVD, No cervical lymphadenopathy appriciated.  Symmetrical Chest wall movement, Good air movement bilaterally, CTAB RRR,No Gallops,Rubs or new Murmurs, No Parasternal Heave +ve B.Sounds, Abd Soft, Non tender, No organomegaly appriciated, No rebound -guarding or rigidity. No Cyanosis, Clubbing or edema, No new Rash or bruise  Data Review   CBC w Diff: Lab Results  Component Value Date   WBC 8.8 07/02/2016   HGB 11.4 (L) 07/02/2016   HCT 34.8 (L) 07/02/2016   PLT 165 07/02/2016   LYMPHOPCT 38 06/27/2016   MONOPCT 8 06/27/2016   EOSPCT 2 06/27/2016   BASOPCT 0 06/27/2016    CMP: Lab Results  Component Value Date   NA 142 07/02/2016   K 3.7 07/02/2016   CL 109 07/02/2016   CO2 27 07/02/2016   BUN 10 07/02/2016   CREATININE 0.83 07/02/2016   PROT 6.2 (L) 07/02/2016   PROT 7.5 04/10/2015   ALBUMIN 3.2 (L) 07/02/2016   BILITOT 0.4 07/02/2016   ALKPHOS 52 07/02/2016   AST 297 (H) 07/02/2016   ALT 65 (H) 07/02/2016  .   Total Time in preparing paper work, data evaluation and todays exam - 35 minutes  [name] M.D on 07/05/2016 at 1:18 PM  [facility]   Office  [phone]

## 2016-07-05 NOTE — Progress Notes (Signed)
CM called Abby of [facility] to notify of pt discharge.  Abby has access to [facility] and states she has orders and discharge summary.  NO other CM needs were communicated.

## 2016-07-05 NOTE — Care Management (Signed)
CM contacted patient's [facility] [phone] regarding d/c order home and discharge plan. HH services recommended HHRN, PT/OT. ACT team coordinator unable to pick patient up from hospital but can meet patient at home. Suggested ambulance services for transport. Explained that they are not able to transport rolling walker, [facility] CM agrees to pick rolling walker up today on the unit, which was left at the nursing station. Referral faxed in to [facility]. Medical Transportation form completed and [facility] called. Updated Rodena Piety RN on 6E

## 2016-07-05 NOTE — Discharge Instructions (Signed)
Follow with Primary MD Edwin A Avbuere, MD in 7 days  ° °Get CBC, CMP,checked  by Primary MD next visit.  ° ° °Activity: As tolerated with Full fall precautions use walker/cane & assistance as needed ° ° °Disposition Home  ° ° °Diet: Heart Healthy  , with feeding assistance and aspiration precautions. ° °For Heart failure patients - Check your Weight same time everyday, if you gain over 2 pounds, or you develop in leg swelling, experience more shortness of breath or chest pain, call your Primary MD immediately. Follow Cardiac Low Salt Diet and 1.5 lit/day fluid restriction. ° ° °On your next visit with your primary care physician please Get Medicines reviewed and adjusted. ° ° °Please request your Prim.MD to go over all Hospital Tests and Procedure/Radiological results at the follow up, please get all Hospital records sent to your Prim MD by signing hospital release before you go home. ° ° °If you experience worsening of your admission symptoms, develop shortness of breath, life threatening emergency, suicidal or homicidal thoughts you must seek medical attention immediately by calling 911 or calling your MD immediately  if symptoms less severe. ° °You Must read complete instructions/literature along with all the possible adverse reactions/side effects for all the Medicines you take and that have been prescribed to you. Take any new Medicines after you have completely understood and accpet all the possible adverse reactions/side effects.  ° °Do not drive, operating heavy machinery, perform activities at heights, swimming or participation in water activities or provide baby sitting services if your were admitted for syncope or siezures until you have seen by Primary MD or a Neurologist and advised to do so again. ° °Do not drive when taking Pain medications.  ° ° °Do not take more than prescribed Pain, Sleep and Anxiety Medications ° °Special Instructions: If you have smoked or chewed Tobacco  in the last 2 yrs  please stop smoking, stop any regular Alcohol  and or any Recreational drug use. ° °Wear Seat belts while driving. ° ° °Please note ° °You were cared for by a hospitalist during your hospital stay. If you have any questions about your discharge medications or the care you received while you were in the hospital after you are discharged, you can call the unit and asked to speak with the hospitalist on call if the hospitalist that took care of you is not available. Once you are discharged, your primary care physician will handle any further medical issues. Please note that NO REFILLS for any discharge medications will be authorized once you are discharged, as it is imperative that you return to your primary care physician (or establish a relationship with a primary care physician if you do not have one) for your aftercare needs so that they can reassess your need for medications and monitor your lab values. ° °

## 2016-07-06 LAB — CULTURE, BLOOD (ROUTINE X 2)
CULTURE: NO GROWTH
CULTURE: NO GROWTH

## 2016-07-07 ENCOUNTER — Telehealth: Payer: Self-pay | Admitting: *Deleted

## 2016-07-07 DIAGNOSIS — R269 Unspecified abnormalities of gait and mobility: Secondary | ICD-10-CM

## 2016-07-07 NOTE — Telephone Encounter (Signed)
I have spoken with Magda Paganini, pt's [facility] (psych) nurse.  Per RAS, I have advised, that since he has not been able to reach a clear cause for pt's sx. his rec. would be for pt. to have a high volume LP (40-64ml).  If walking improves after LP, he would refer to NS for eval for shunt placement.  Magda Paganini verbalized understanding of same, sts. she will discuss with pt. and call me back tomorrow/fim

## 2016-07-09 NOTE — Telephone Encounter (Signed)
Magda Paganini called back to advise that the pt had agreed to LP. She can be reached at [phone] to shedule appt

## 2016-07-10 NOTE — Addendum Note (Signed)
Addended by: France Ravens I on: 07/10/2016 02:09 PM   Modules accepted: Orders

## 2016-07-10 NOTE — Telephone Encounter (Signed)
LP and labs (VDRL, protein, glucose and cell ct.) ordered.  I have spoken with Magda Paganini and advised that LP will be ordered, ok'd with ins., then [place] will call to schedule/fim

## 2016-07-17 ENCOUNTER — Other Ambulatory Visit: Payer: Self-pay | Admitting: Neurology

## 2016-07-17 ENCOUNTER — Ambulatory Visit
Admission: RE | Admit: 2016-07-17 | Discharge: 2016-07-17 | Disposition: A | Discharge status: Home / Self Care | Payer: Medicare Other | Source: Ambulatory Visit | Attending: Neurology | Admitting: Neurology

## 2016-07-17 DIAGNOSIS — R269 Unspecified abnormalities of gait and mobility: Secondary | ICD-10-CM

## 2016-07-17 LAB — CSF CELL COUNT WITH DIFFERENTIAL
RBC Count, CSF: 197 cells/uL — ABNORMAL HIGH (ref 0–10)
WBC CSF: 3 {cells}/uL (ref 0–5)

## 2016-07-17 LAB — PROTEIN, CSF: TOTAL PROTEIN, CSF: 99 mg/dL — AB (ref 15–45)

## 2016-07-17 LAB — GLUCOSE, CSF: Glucose, CSF: 78 mg/dL — ABNORMAL HIGH (ref 43–76)

## 2016-07-17 NOTE — Discharge Instructions (Signed)

## 2016-07-20 LAB — VDRL, CSF: VDRL Quant, CSF: NONREACTIVE

## 2016-07-29 NOTE — Telephone Encounter (Signed)
[name] has not returned my calls.  RAS needs to know if pt.'s walking improved after his high volume LP.  I have spoken with [name] this afternoon--he sts. walking was much improved. He sts. he will see [name] tomorrow.  I have asked that he ask her to call me and he is agreeable/fim

## 2016-08-06 ENCOUNTER — Other Ambulatory Visit: Payer: Self-pay | Admitting: *Deleted

## 2016-08-06 DIAGNOSIS — R269 Unspecified abnormalities of gait and mobility: Secondary | ICD-10-CM

## 2016-08-06 DIAGNOSIS — G912 (Idiopathic) normal pressure hydrocephalus: Secondary | ICD-10-CM

## 2016-08-06 NOTE — Telephone Encounter (Signed)
I have spoken with Magda Paganini this morning.  She sts. Dayan's gait was improved after high volume tap.  Per RAS, ok to refer to NS for eval for shunt placement, for normal pressure hydrocephalus.  Referral ordered, with request that NS call Magda Paganini (phone# [phone]) to sched. appt. for pt/fim

## 2016-08-20 ENCOUNTER — Other Ambulatory Visit: Payer: Self-pay | Admitting: Neurosurgery

## 2016-08-20 DIAGNOSIS — G912 (Idiopathic) normal pressure hydrocephalus: Secondary | ICD-10-CM

## 2016-09-01 ENCOUNTER — Ambulatory Visit (INDEPENDENT_AMBULATORY_CARE_PROVIDER_SITE_OTHER): Payer: Medicare Other | Admitting: Neurology

## 2016-09-01 ENCOUNTER — Encounter: Payer: Self-pay | Admitting: Neurology

## 2016-09-01 VITALS — BP 134/72 | HR 80 | Resp 16 | Ht 73.0 in | Wt 193.5 lb

## 2016-09-01 DIAGNOSIS — G912 (Idiopathic) normal pressure hydrocephalus: Secondary | ICD-10-CM | POA: Insufficient documentation

## 2016-09-01 DIAGNOSIS — M5431 Sciatica, right side: Secondary | ICD-10-CM

## 2016-09-01 DIAGNOSIS — R269 Unspecified abnormalities of gait and mobility: Secondary | ICD-10-CM

## 2016-09-01 DIAGNOSIS — M47816 Spondylosis without myelopathy or radiculopathy, lumbar region: Secondary | ICD-10-CM | POA: Diagnosis not present

## 2016-09-01 DIAGNOSIS — M5432 Sciatica, left side: Secondary | ICD-10-CM | POA: Diagnosis not present

## 2016-09-01 NOTE — Progress Notes (Signed)
[facility]  PATIENT: [name] DOB: [birth year]  REFERRING DOCTOR OR PCP:  Nolene Ebbs (PCP), referred by Basil Dess (Ortho) SOURCE: Patient, records form Ortho and EMR, images and reports on PACS  _________________________________   HISTORICAL  CHIEF COMPLAINT:  Chief Complaint  Patient presents with  . Gait Disturbance    Sts.walking was improved for a short time following high volume spinal tap. He  has been referred to NS for eval for shunt placement.   Sts. he continues to have lbp radiating into both legs, right leg worse than left/fim  . Back Pain  . Loss of Vision    HISTORY OF PRESENT ILLNESS:  [name] is a 60 year old man with gait disturbance that appears to be due to normal pressure hydrocephalus and lower back pain.    He had a high volume lumbar puncture in early August and gait was much better for a couple weeks but then worsened again.  He has not needed to use a wheelchair recently.   He feels bladder was about the same (stull had some frequency but no incontinence).      MRI shows enlarged lateral ventricles that appear a little out of proportion to the extent of cortical atrophy. The ventricular enlargement progressed quite a bit between 2007 and 2016.  His nurse reports that he sore neurosurgery and that a second high-volume tap has been scheduled and they would consider surgery based on the results of the second tap.     LBP:   He notes lower back pain and has some pain radiating down his legs, left > right.   He has had L2-L5 fusion and no foraminal narrowing was seen on CT scan from 2013.  He has not noted any relationship between pain fluctuations and gait disturbance. Besides the surgery, in the past, he had radiofrequency ablation to denervate the L5-S1 facet joints.   Low back pain increases if he sits a long time.   MRI of the cervical spine and brain dated  05/13/2015 were reviewed: IMPRESSION: This is an abnormal MRI of  the brain without contrast showing moderately severe cortical atrophy with compensatory ventricular enlargement there are extensive T2/flair hyperintense foci consistent with severe chronic small vessel ischemic changes. Demyelination and vasculitis . would be less likely to have this pattern was compared to CT scan dated 09/21/2006, there has been considerable worsening in the extent of atrophy and white matter changes.  IMPRESSION: This is an abnormal MRI of the cervical spine showing:   1. C4-C6 ACDF. This has occurred since the MRI dated 03/16/2009 and there is improvement in the foraminal stenosis noted at that time at these levels. 2. Mild to moderate degenerative changes at C3-C4 and C6-C7 that do not lead to any definite nerve root compression. When compared to the prior study, there has been some progression of the degenerative changes at these levels.   REVIEW OF SYSTEMS: Constitutional: No fevers, chills, sweats, or change in appetite Eyes: No visual changes, double vision, eye pain Ear, nose and throat: No hearing loss, ear pain, nasal congestion, sore throat Cardiovascular: No chest pain, palpitations Respiratory: No shortness of breath at rest or with exertion.   No wheezes GastrointestinaI: No nausea, vomiting, diarrhea, abdominal pain, fecal incontinence Genitourinary: No dysuria, urinary retention or frequency.  No nocturia. Musculoskeletal: No neck pain, some back pain (was helped by RFA) Integumentary: No rash, pruritus, skin lesions Neurological: as above Psychiatric: ha bipolar and is followed by psychiatry.  Endocrine: No palpitations, diaphoresis, change in appetite, change in weigh or increased thirst.   Has DM Hematologic/Lymphatic: No anemia, purpura, petechiae. Allergic/Immunologic: No itchy/runny eyes, nasal congestion, recent allergic reactions, rashes  ALLERGIES: Allergies  Allergen Reactions  . Penicillins Hives and Other (See Comments)    Has  patient had a PCN reaction causing immediate rash, facial/tongue/throat swelling, SOB or lightheadedness with hypotension: No Has patient had a PCN reaction causing severe rash involving mucus membranes or skin necrosis: No Has patient had a PCN reaction that required hospitalization No Has patient had a PCN reaction occurring within the last 10 years: No If all of the above answers are "NO", then may proceed with Cephalosporin use.    HOME MEDICATIONS:  Current Outpatient Prescriptions:  .  benztropine (COGENTIN) 0.5 MG tablet, Take 1 tablet (0.5 mg total) by mouth 2 (two) times daily., Disp: 60 tablet, Rfl: 0 .  buPROPion (WELLBUTRIN SR) 150 MG 12 hr tablet, Take 150 mg by mouth daily. , Disp: , Rfl:  .  cholecalciferol (VITAMIN D) 1000 units tablet, Take 1,000 Units by mouth daily., Disp: , Rfl:  .  citalopram (CELEXA) 10 MG tablet, Take 1 tablet (10 mg total) by mouth at bedtime., Disp: 30 tablet, Rfl: 0 .  gabapentin (NEURONTIN) 100 MG capsule, Take 2 capsules (200 mg total) by mouth 2 (two) times daily., Disp: 120 capsule, Rfl: 0 .  metFORMIN (GLUCOPHAGE) 500 MG tablet, Take 500 mg by mouth 2 (two) times daily with a meal., Disp: , Rfl:  .  risperiDONE (RISPERDAL) 1 MG tablet, Take 1 tablet (1 mg total) by mouth 2 (two) times daily., Disp: 60 tablet, Rfl: 0 .  sitaGLIPtin (JANUVIA) 100 MG tablet, Take 100 mg by mouth daily with breakfast., Disp: , Rfl:  .  tamsulosin (FLOMAX) 0.4 MG CAPS capsule, Take 0.4 mg by mouth daily after supper. , Disp: , Rfl:   PAST MEDICAL HISTORY: Past Medical History:  Diagnosis Date  . Chronic back pain   . Diabetes mellitus without complication ([place])   . DVT (deep venous thrombosis) ([place])   . History of colon polyps   . Mental disorder    bipolar  . Peripheral vascular disease ([place])   . Pneumonia    hx of-about 32yrs ago    PAST SURGICAL HISTORY: Past Surgical History:  Procedure Laterality Date  . APPENDECTOMY     early 44's  . BACK SURGERY   05/2011   x 5  . blood clot in groin  early 80's  . CARPAL TUNNEL RELEASE     left  . CERVICAL SPINE SURGERY  2009  . COLONOSCOPY    . HERNIA REPAIR     early 2000's  . SHOULDER SURGERY  70's   right    FAMILY HISTORY: Family History  Problem Relation Age of Onset  . Heart attack Father   . Anesthesia problems Neg Hx   . Hypotension Neg Hx   . Malignant hyperthermia Neg Hx   . Pseudochol deficiency Neg Hx     SOCIAL HISTORY:  Social History   Social History  . Marital status: Unknown    Spouse name: N/A  . Number of children: N/A  . Years of education: N/A   Occupational History  . Not on file.   Social History Main Topics  . Smoking status: Current Every Day Smoker    Packs/day: 1.00    Years: 40.00    Types: Cigarettes  . Smokeless tobacco: Never Used  Comment: pt states that he is ready to quit smoking but thinks he will need patches advised him to speak with his PCP which he is seaing this Thursday 3/26  . Alcohol use No  . Drug use: No  . Sexual activity: Yes   Other Topics Concern  . Not on file   Social History Narrative  . No narrative on file     PHYSICAL EXAM  Vitals:   09/01/16 1601  BP: 134/72  Pulse: 80  Resp: 16  Weight: 193 lb 8 oz (87.8 kg)  Height: 6\' 1"  (1.854 m)    Body mass index is 25.53 kg/m.   General: The patient is well-developed and well-nourished and in no acute distress   Musculoskeletal:  Back is tender over piriformis muscles.   He has reduced ROM  Neurologic Exam  Mental status: The patient is alert and oriented x 3 at the time of the examination. The patient has apparent normal recent and remote memory, with an apparently normal attention span and concentration ability.   Speech is normal.  Cranial nerves: Extraocular movements are full.  Facial strength is normal.  Trapezius and sternocleidomastoid strength is normal. No dysarthria is noted.  No obvious hearing deficits are noted.  Motor:  Muscle bulk  is normal.   Tone is normal. Strength is  5 / 5 in all 4 extremities.   Sensory: Sensory testing is intact to pinprick, soft touch and vibration sensation in arms and proximal leg but decreased vibration and temperature sensation in his toes/feet..  Coordination: Cerebellar testing reveals good finger-nose-finger and heel-to-shin bilaterally.  Gait and station: Station is mildly wide.   Gait is wide with mildly short stride but appears better than last visit.  He'll walk without support but is much faster with a walker. He does a 4 step 180 degree turn.   He has retropulsion.   He cannot tandem walk.   Romberg is negative.   Reflexes: Deep tendon reflexes are symmetric and normal bilaterally.        DIAGNOSTIC DATA (LABS, IMAGING, TESTING) - I reviewed patient records, labs, notes, testing and imaging myself where available.  Lab Results  Component Value Date   WBC 8.8 07/02/2016   HGB 11.4 (L) 07/02/2016   HCT 34.8 (L) 07/02/2016   MCV 95.1 07/02/2016   PLT 165 07/02/2016      Component Value Date/Time   NA 142 07/02/2016 0225   K 3.7 07/02/2016 0225   CL 109 07/02/2016 0225   CO2 27 07/02/2016 0225   GLUCOSE 95 07/02/2016 0225   BUN 10 07/02/2016 0225   CREATININE 0.83 07/02/2016 0225   CALCIUM 9.0 07/02/2016 0225   PROT 6.2 (L) 07/02/2016 0225   PROT 7.5 04/10/2015 1411   ALBUMIN 3.2 (L) 07/02/2016 0225   AST 297 (H) 07/02/2016 0225   ALT 65 (H) 07/02/2016 0225   ALKPHOS 52 07/02/2016 0225   BILITOT 0.4 07/02/2016 0225   GFRNONAA >60 07/02/2016 0225   GFRAA >60 07/02/2016 0225   Lab Results  Component Value Date   CHOL 177 06/15/2014   HDL 25 (L) 06/15/2014   LDLCALC 115 (H) 06/15/2014   TRIG 184 06/15/2014   Lab Results  Component Value Date   HGBA1C 6.5 (H) 06/15/2014        ASSESSMENT AND PLAN  Gait disturbance  Normal pressure hydrocephalus  Bilateral sciatica  Lumbar spondylosis    1.  He has seen neurosurgery and they recommended a  second high-volume  tap that is already scheduled. If he gets a benefit with that one, they may proceed with shunt placement. 2.  Bilateral piriformis muscle trigger point injections with 80 mg Depo-Medrol in 5 mL Marcaine using sterile technique. He tolerated the injection well.   3.   He will return to see in 4 months or sooner if there are new or significant worsening in terms.   Delmore Sear A. Felecia Shelling, MD, PhD [id number], [id number] PM Certified in Neurology, Clinical Neurophysiology, Sleep Medicine, Pain Medicine and Neuroimaging  [facility] [address] [place], [place] [address] [phone]

## 2016-09-08 ENCOUNTER — Ambulatory Visit
Admission: RE | Admit: 2016-09-08 | Discharge: 2016-09-08 | Disposition: A | Discharge status: Home / Self Care | Payer: Medicare Other | Source: Ambulatory Visit | Attending: Neurosurgery | Admitting: Neurosurgery

## 2016-09-08 DIAGNOSIS — G912 (Idiopathic) normal pressure hydrocephalus: Secondary | ICD-10-CM

## 2016-09-08 NOTE — Discharge Instructions (Signed)

## 2016-09-11 ENCOUNTER — Telehealth: Payer: Self-pay

## 2016-09-11 NOTE — Telephone Encounter (Signed)
Spoke with patient after his LP here 09/08/16.  He says he's still a bit sore but denies any headache.  jkl

## 2016-09-29 ENCOUNTER — Telehealth: Payer: Self-pay | Admitting: Neurology

## 2016-09-29 NOTE — Telephone Encounter (Signed)
LMOM for [name] that [name] was referred to NS for eval for shunt placement.  Once shunt is placed, he can make and appt. with RAS for check up.  If for some reason, shunt is not placed, appt. can still be made with RAS, and other tx. options can be discussed during appt./fim

## 2016-09-29 NOTE — Telephone Encounter (Signed)
Leslie/ [facility] called to talk about the status of the pt and where he is in treatment. Please call [phone]

## 2016-10-06 ENCOUNTER — Telehealth: Payer: Self-pay | Admitting: Neurology

## 2016-10-06 NOTE — Telephone Encounter (Signed)
I spoke with Dr. Saintclair Halsted. Based on [name] clinical presentation and improvement in gait and bladder issues with his high volume tap, I feel that normal pressure hydrocephalus is the most likely explanation of his symptoms.   He is having a second high-volume tap and will be reevaluated by Dr. Saintclair Halsted before a decision on surgery is made.

## 2016-10-15 ENCOUNTER — Other Ambulatory Visit: Payer: Self-pay | Admitting: Neurosurgery

## 2016-10-28 ENCOUNTER — Encounter (HOSPITAL_COMMUNITY)
Admission: RE | Admit: 2016-10-28 | Discharge: 2016-10-28 | Disposition: A | Discharge status: Home / Self Care | Payer: Medicare Other | Source: Ambulatory Visit | Attending: Neurosurgery | Admitting: Neurosurgery

## 2016-10-28 ENCOUNTER — Other Ambulatory Visit (HOSPITAL_COMMUNITY): Payer: Self-pay | Admitting: *Deleted

## 2016-10-28 ENCOUNTER — Encounter (HOSPITAL_COMMUNITY): Payer: Self-pay

## 2016-10-28 HISTORY — DX: Headache: R51

## 2016-10-28 HISTORY — DX: Inflammatory liver disease, unspecified: K75.9

## 2016-10-28 HISTORY — DX: Schizophrenia, unspecified: F20.9

## 2016-10-28 HISTORY — DX: Anxiety disorder, unspecified: F41.9

## 2016-10-28 HISTORY — DX: Benign prostatic hyperplasia without lower urinary tract symptoms: N40.0

## 2016-10-28 HISTORY — DX: Type 2 diabetes mellitus with diabetic neuropathy, unspecified: E11.40

## 2016-10-28 HISTORY — DX: Bipolar disorder, unspecified: F31.9

## 2016-10-28 HISTORY — DX: Headache, unspecified: R51.9

## 2016-10-28 HISTORY — DX: Major depressive disorder, single episode, unspecified: F32.9

## 2016-10-28 HISTORY — DX: Depression, unspecified: F32.A

## 2016-10-28 LAB — BASIC METABOLIC PANEL
Anion gap: 10 (ref 5–15)
CHLORIDE: 104 mmol/L (ref 101–111)
CO2: 26 mmol/L (ref 22–32)
CREATININE: 1 mg/dL (ref 0.61–1.24)
Calcium: 10.2 mg/dL (ref 8.9–10.3)
GFR calc Af Amer: >60 mL/min (ref >60)
GFR calc non Af Amer: >60 mL/min (ref >60)
GLUCOSE: 114 mg/dL — AB (ref 65–99)
Potassium: 3.7 mmol/L (ref 3.5–5.1)
SODIUM: 140 mmol/L (ref 135–145)

## 2016-10-28 LAB — GLUCOSE, CAPILLARY: Glucose-Capillary: 148 mg/dL — ABNORMAL HIGH (ref 65–99)

## 2016-10-28 LAB — CBC
HEMATOCRIT: 39.4 % (ref 39.0–52.0)
Hemoglobin: 13.8 g/dL (ref 13.0–17.0)
MCH: 32.1 pg (ref 26.0–34.0)
MCHC: 35 g/dL (ref 30.0–36.0)
MCV: 91.6 fL (ref 78.0–100.0)
PLATELETS: 257 10*3/uL (ref 150–400)
RBC: 4.3 MIL/uL (ref 4.22–5.81)
RDW: 11.7 % (ref 11.5–15.5)
WBC: 11.4 10*3/uL — AB (ref 4.0–10.5)

## 2016-10-28 NOTE — Pre-Procedure Instructions (Addendum)
VIDUR ARCOS  10/28/2016    Your procedure is scheduled on Friday, October 30, 2016 at 7:30 AM.   Report to [facility] Entrance "A" Admitting Office at 5:30 AM.   Call this number if you have problems the morning of surgery: [phone]   Questions prior to day of surgery, please call [phone] between 8 & 4 PM.   Remember:  Do not eat food or drink liquids after midnight Thursday, 10/29/16.  Take these medicines the morning of surgery with A SIP OF WATER:  Bupropion (Wellbutrin), Citalopram (Celexa), Gabapentin (Neurontin), Risperidone (Risperdal)  Do not use Aspirin products or NSAIDS (Ibuprofen, Aleve, etc) prior to surgery.  Do NOT smoke 24 hours prior to surgery.   How to Manage Your Diabetes Before Surgery   Why is it important to control my blood sugar before and after surgery?   Improving blood sugar levels before and after surgery helps healing and can limit problems.  A way of improving blood sugar control is eating a healthy diet by:  - Eating less sugar and carbohydrates  - Increasing activity/exercise  - Talk with your doctor about reaching your blood sugar goals  High blood sugars (greater than 180 mg/dL) can raise your risk of infections and slow down your recovery so you will need to focus on controlling your diabetes during the weeks before surgery.  Make sure that the doctor who takes care of your diabetes knows about your planned surgery including the date and location.  How do I manage my blood sugars before surgery?   Check your blood sugar at least 4 times a day, 2 days before surgery to make sure that they are not too high or low.  Check your blood sugar the morning of your surgery when you wake up and every 2 hours until you get to the Short-Stay unit.  Treat a low blood sugar (less than 70 mg/dL) with 1/2 cup of clear juice (cranberry or apple), 4 glucose tablets, OR glucose gel.  Recheck blood sugar in 15 minutes after  treatment (to make sure it is greater than 70 mg/dL).  If blood sugar is not greater than 70 mg/dL on re-check, call [phone] for further instructions.   Report your blood sugar to the Short-Stay nurse when you get to Short-Stay.  References:  [facility], 2007 "How to Manage your Diabetes Before and After Surgery".  What do I do about my diabetes medications?   Do not take oral diabetes medicines (pills) the morning of surgery.   Do not wear jewelry.  Do not wear lotions, powders, or cologne.  Men may shave face and neck.  Do not bring valuables to the hospital.  [facility] is not responsible for any belongings or valuables.  Contacts, dentures or bridgework may not be worn into surgery.  Leave your suitcase in the car.  After surgery it may be brought to your room.  For patients admitted to the hospital, discharge time will be determined by your treatment team.  Special instructions:  [facility] - Preparing for Surgery  Before surgery, you can play an important role.  Because skin is not sterile, your skin needs to be as free of germs as possible.  You can reduce the number of germs on you skin by washing with CHG (chlorahexidine gluconate) soap before surgery.  CHG is an antiseptic cleaner which kills germs and bonds with the skin to continue killing germs even after washing.  Please  DO NOT use if you have an allergy to CHG or antibacterial soaps.  If your skin becomes reddened/irritated stop using the CHG and inform your nurse when you arrive at Short Stay.  Do not shave (including legs and underarms) for at least 48 hours prior to the first CHG shower.  You may shave your face.  Please follow these instructions carefully:   1.  Shower with CHG Soap the night before surgery and the                    morning of Surgery.  2.  If you choose to wash your hair, wash your hair first as usual with your       normal shampoo.  3.  After you shampoo,  rinse your hair and body thoroughly to remove the shampoo.  4.  Use CHG as you would any other liquid soap.  You can apply chg directly       to the skin and wash gently with scrungie or a clean washcloth.  5.  Apply the CHG Soap to your body ONLY FROM THE NECK DOWN.        Do not use on open wounds or open sores.  Avoid contact with your eyes, ears, mouth and genitals (private parts).  Wash genitals (private parts) with your normal soap.  6.  Wash thoroughly, paying special attention to the area where your surgery        will be performed.  7.  Thoroughly rinse your body with warm water from the neck down.  8.  DO NOT shower/wash with your normal soap after using and rinsing off       the CHG Soap.  9.  Pat yourself dry with a clean towel.            10.  Wear clean pajamas.            11.  Place clean sheets on your bed the night of your first shower and do not        sleep with pets.  Day of Surgery  Do not apply any lotions the morning of surgery.  Please wear clean clothes to the hospital.   Please read over the fact sheets that you were given.

## 2016-10-28 NOTE — Progress Notes (Signed)
Pt denies cardiac history, chest pain or sob. Pt is diabetic, last A1C was ? 4 months ago but doesn't remember what it was. When asked if he checks his blood sugar at home, he stated "not regularly". States the last one he did was approx 3-4 weeks ago and it was 130. Pt has a CNA come every morning but for some reason (which he wouldn't say) she is not checking it for him. Pt's nurse, Cipriano Bunker, RN from [facility] was with pt during PAT appt. She states she will make sure pt has the supplies and that the CNA will be checking his blood sugar for him prior to surgery.

## 2016-10-29 LAB — HEMOGLOBIN A1C
Hgb A1c MFr Bld: 5 % (ref 4.8–5.6)
Mean Plasma Glucose: 97 mg/dL

## 2016-10-30 ENCOUNTER — Encounter (HOSPITAL_COMMUNITY)
Admission: RE | Disposition: A | Discharge status: Home / Self Care | Payer: Self-pay | Source: Ambulatory Visit | Attending: Neurosurgery

## 2016-10-30 ENCOUNTER — Inpatient Hospital Stay (HOSPITAL_COMMUNITY): Payer: Medicare Other | Admitting: Anesthesiology

## 2016-10-30 ENCOUNTER — Inpatient Hospital Stay (HOSPITAL_COMMUNITY)
Admission: RE | Admit: 2016-10-30 | Discharge: 2016-10-31 | DRG: 033 | Disposition: A | Discharge status: Home / Self Care | Payer: Medicare Other | Source: Ambulatory Visit | Attending: Neurosurgery | Admitting: Neurosurgery

## 2016-10-30 ENCOUNTER — Encounter (HOSPITAL_COMMUNITY): Payer: Self-pay | Admitting: Urology

## 2016-10-30 DIAGNOSIS — F209 Schizophrenia, unspecified: Secondary | ICD-10-CM | POA: Diagnosis present

## 2016-10-30 DIAGNOSIS — F17210 Nicotine dependence, cigarettes, uncomplicated: Secondary | ICD-10-CM | POA: Diagnosis present

## 2016-10-30 DIAGNOSIS — Z880 Allergy status to penicillin: Secondary | ICD-10-CM | POA: Diagnosis not present

## 2016-10-30 DIAGNOSIS — F419 Anxiety disorder, unspecified: Secondary | ICD-10-CM | POA: Diagnosis present

## 2016-10-30 DIAGNOSIS — Z86010 Personal history of colonic polyps: Secondary | ICD-10-CM

## 2016-10-30 DIAGNOSIS — E1151 Type 2 diabetes mellitus with diabetic peripheral angiopathy without gangrene: Secondary | ICD-10-CM | POA: Diagnosis present

## 2016-10-30 DIAGNOSIS — Z7984 Long term (current) use of oral hypoglycemic drugs: Secondary | ICD-10-CM

## 2016-10-30 DIAGNOSIS — R51 Headache: Secondary | ICD-10-CM | POA: Diagnosis present

## 2016-10-30 DIAGNOSIS — R262 Difficulty in walking, not elsewhere classified: Secondary | ICD-10-CM | POA: Diagnosis present

## 2016-10-30 DIAGNOSIS — M549 Dorsalgia, unspecified: Secondary | ICD-10-CM | POA: Diagnosis present

## 2016-10-30 DIAGNOSIS — G8929 Other chronic pain: Secondary | ICD-10-CM | POA: Diagnosis present

## 2016-10-30 DIAGNOSIS — Z79899 Other long term (current) drug therapy: Secondary | ICD-10-CM | POA: Diagnosis not present

## 2016-10-30 DIAGNOSIS — F319 Bipolar disorder, unspecified: Secondary | ICD-10-CM | POA: Diagnosis present

## 2016-10-30 DIAGNOSIS — G912 (Idiopathic) normal pressure hydrocephalus: Secondary | ICD-10-CM | POA: Diagnosis present

## 2016-10-30 HISTORY — PX: VENTRICULOPERITONEAL SHUNT: SHX204

## 2016-10-30 LAB — GLUCOSE, CAPILLARY
GLUCOSE-CAPILLARY: 148 mg/dL — AB (ref 65–99)
GLUCOSE-CAPILLARY: 140 mg/dL — AB (ref 65–99)
Glucose-Capillary: 153 mg/dL — ABNORMAL HIGH (ref 65–99)
Glucose-Capillary: 182 mg/dL — ABNORMAL HIGH (ref 65–99)

## 2016-10-30 LAB — MRSA PCR SCREENING: MRSA by PCR: NEGATIVE

## 2016-10-30 SURGERY — SHUNT INSERTION VENTRICULAR-PERITONEAL
Anesthesia: General

## 2016-10-30 MED ORDER — PROPOFOL 10 MG/ML IV BOLUS
INTRAVENOUS | Status: AC
  Filled 2016-10-30: qty 40

## 2016-10-30 MED ORDER — VITAMIN D 1000 UNITS PO TABS
1000.0000 [IU] | ORAL_TABLET | Freq: Every day | ORAL | Status: DC
  Administered 2016-10-31: 1000 [IU] via ORAL
  Filled 2016-10-30: qty 1

## 2016-10-30 MED ORDER — PANTOPRAZOLE SODIUM 40 MG IV SOLR: 40.0000 mg | Freq: Every day | INTRAVENOUS | Status: DC

## 2016-10-30 MED ORDER — PROMETHAZINE HCL 25 MG PO TABS: 12.5000 mg | ORAL_TABLET | ORAL | Status: DC | PRN

## 2016-10-30 MED ORDER — VANCOMYCIN HCL IN DEXTROSE 1-5 GM/200ML-% IV SOLN
1000.0000 mg | INTRAVENOUS | Status: AC
  Administered 2016-10-30: 1000 mg via INTRAVENOUS
  Filled 2016-10-30: qty 200

## 2016-10-30 MED ORDER — CHLORHEXIDINE GLUCONATE CLOTH 2 % EX PADS: 6.0000 | MEDICATED_PAD | Freq: Once | CUTANEOUS | Status: DC

## 2016-10-30 MED ORDER — FENTANYL CITRATE (PF) 100 MCG/2ML IJ SOLN
25.0000 ug | INTRAMUSCULAR | Status: DC | PRN
  Administered 2016-10-30: 50 ug via INTRAVENOUS
  Administered 2016-10-30: 25 ug via INTRAVENOUS

## 2016-10-30 MED ORDER — KCL IN DEXTROSE-NACL 20-5-0.45 MEQ/L-%-% IV SOLN
INTRAVENOUS | Status: DC
  Administered 2016-10-30: 1000 mL via INTRAVENOUS
  Administered 2016-10-30: 10:00:00 via INTRAVENOUS
  Filled 2016-10-30 (×3): qty 1000

## 2016-10-30 MED ORDER — BACITRACIN ZINC 500 UNIT/GM EX OINT
TOPICAL_OINTMENT | CUTANEOUS | Status: AC
  Filled 2016-10-30: qty 28.35

## 2016-10-30 MED ORDER — ONDANSETRON HCL 4 MG PO TABS: 4.0000 mg | ORAL_TABLET | ORAL | Status: DC | PRN

## 2016-10-30 MED ORDER — LABETALOL HCL 5 MG/ML IV SOLN: 10.0000 mg | INTRAVENOUS | Status: DC | PRN

## 2016-10-30 MED ORDER — THROMBIN 5000 UNITS EX SOLR
CUTANEOUS | Status: DC | PRN
  Administered 2016-10-30 (×2): 5000 [IU] via TOPICAL

## 2016-10-30 MED ORDER — GABAPENTIN 100 MG PO CAPS
200.0000 mg | ORAL_CAPSULE | Freq: Two times a day (BID) | ORAL | Status: DC
  Administered 2016-10-30 – 2016-10-31 (×2): 200 mg via ORAL
  Filled 2016-10-30 (×2): qty 2

## 2016-10-30 MED ORDER — LINAGLIPTIN 5 MG PO TABS
5.0000 mg | ORAL_TABLET | Freq: Every day | ORAL | Status: DC
  Administered 2016-10-30 – 2016-10-31 (×2): 5 mg via ORAL
  Filled 2016-10-30 (×2): qty 1

## 2016-10-30 MED ORDER — FENTANYL CITRATE (PF) 250 MCG/5ML IJ SOLN
INTRAMUSCULAR | Status: DC | PRN
  Administered 2016-10-30 (×2): 100 ug via INTRAVENOUS

## 2016-10-30 MED ORDER — 0.9 % SODIUM CHLORIDE (POUR BTL) OPTIME
TOPICAL | Status: DC | PRN
  Administered 2016-10-30: 1000 mL

## 2016-10-30 MED ORDER — ONDANSETRON HCL 4 MG/2ML IJ SOLN
INTRAMUSCULAR | Status: DC | PRN
  Administered 2016-10-30: 4 mg via INTRAVENOUS

## 2016-10-30 MED ORDER — METFORMIN HCL 500 MG PO TABS
500.0000 mg | ORAL_TABLET | Freq: Two times a day (BID) | ORAL | Status: DC
  Administered 2016-10-30 – 2016-10-31 (×2): 500 mg via ORAL
  Filled 2016-10-30 (×2): qty 1

## 2016-10-30 MED ORDER — ROCURONIUM BROMIDE 100 MG/10ML IV SOLN
INTRAVENOUS | Status: DC | PRN
  Administered 2016-10-30: 50 mg via INTRAVENOUS
  Administered 2016-10-30: 10 mg via INTRAVENOUS

## 2016-10-30 MED ORDER — MEPERIDINE HCL 25 MG/ML IJ SOLN: 6.2500 mg | INTRAMUSCULAR | Status: DC | PRN

## 2016-10-30 MED ORDER — FENTANYL CITRATE (PF) 100 MCG/2ML IJ SOLN
INTRAMUSCULAR | Status: AC
  Filled 2016-10-30: qty 2

## 2016-10-30 MED ORDER — CITALOPRAM HYDROBROMIDE 10 MG PO TABS
20.0000 mg | ORAL_TABLET | Freq: Every day | ORAL | Status: DC
  Administered 2016-10-30: 20 mg via ORAL
  Filled 2016-10-30: qty 2

## 2016-10-30 MED ORDER — SODIUM CHLORIDE 0.9 % IV SOLN
500.0000 mg | Freq: Two times a day (BID) | INTRAVENOUS | Status: DC
  Filled 2016-10-30: qty 5

## 2016-10-30 MED ORDER — TRAZODONE HCL 100 MG PO TABS
200.0000 mg | ORAL_TABLET | Freq: Every evening | ORAL | Status: DC | PRN
  Filled 2016-10-30: qty 2

## 2016-10-30 MED ORDER — LIDOCAINE HCL (CARDIAC) 20 MG/ML IV SOLN
INTRAVENOUS | Status: DC | PRN
  Administered 2016-10-30: 60 mg via INTRATRACHEAL

## 2016-10-30 MED ORDER — BUPROPION HCL ER (SR) 150 MG PO TB12
150.0000 mg | ORAL_TABLET | Freq: Every day | ORAL | Status: DC
  Administered 2016-10-30 – 2016-10-31 (×2): 150 mg via ORAL
  Filled 2016-10-30 (×2): qty 1

## 2016-10-30 MED ORDER — HEMOSTATIC AGENTS (NO CHARGE) OPTIME
TOPICAL | Status: DC | PRN
  Administered 2016-10-30: 1 via TOPICAL

## 2016-10-30 MED ORDER — SODIUM CHLORIDE 0.9 % IR SOLN
Status: DC | PRN
  Administered 2016-10-30: 09:00:00

## 2016-10-30 MED ORDER — LACTATED RINGERS IV SOLN: INTRAVENOUS | Status: DC

## 2016-10-30 MED ORDER — HYDROCODONE-ACETAMINOPHEN 5-325 MG PO TABS
1.0000 | ORAL_TABLET | ORAL | Status: DC | PRN
  Administered 2016-10-30 – 2016-10-31 (×3): 1 via ORAL
  Filled 2016-10-30 (×3): qty 1

## 2016-10-30 MED ORDER — PROPOFOL 10 MG/ML IV BOLUS
INTRAVENOUS | Status: DC | PRN
  Administered 2016-10-30: 200 mg via INTRAVENOUS

## 2016-10-30 MED ORDER — KCL IN DEXTROSE-NACL 20-5-0.45 MEQ/L-%-% IV SOLN
INTRAVENOUS | Status: AC
  Administered 2016-10-30: 1000 mL
  Filled 2016-10-30: qty 1000

## 2016-10-30 MED ORDER — LIDOCAINE-EPINEPHRINE (PF) 2 %-1:200000 IJ SOLN
INTRAMUSCULAR | Status: DC | PRN
  Administered 2016-10-30: 10 mL via INTRADERMAL

## 2016-10-30 MED ORDER — SUGAMMADEX SODIUM 200 MG/2ML IV SOLN
INTRAVENOUS | Status: DC | PRN
  Administered 2016-10-30: 180 mg via INTRAVENOUS

## 2016-10-30 MED ORDER — THROMBIN 5000 UNITS EX SOLR
CUTANEOUS | Status: AC
  Filled 2016-10-30: qty 10000

## 2016-10-30 MED ORDER — VANCOMYCIN HCL IN DEXTROSE 1-5 GM/200ML-% IV SOLN
1000.0000 mg | Freq: Two times a day (BID) | INTRAVENOUS | Status: DC
  Administered 2016-10-30 – 2016-10-31 (×2): 1000 mg via INTRAVENOUS
  Filled 2016-10-30 (×3): qty 200

## 2016-10-30 MED ORDER — LIDOCAINE-EPINEPHRINE (PF) 2 %-1:200000 IJ SOLN
INTRAMUSCULAR | Status: AC
  Filled 2016-10-30: qty 20

## 2016-10-30 MED ORDER — LEVETIRACETAM 500 MG PO TABS
500.0000 mg | ORAL_TABLET | Freq: Two times a day (BID) | ORAL | Status: DC
  Administered 2016-10-30 – 2016-10-31 (×2): 500 mg via ORAL
  Filled 2016-10-30 (×3): qty 1

## 2016-10-30 MED ORDER — BENZTROPINE MESYLATE 1 MG PO TABS
0.5000 mg | ORAL_TABLET | Freq: Two times a day (BID) | ORAL | Status: DC
  Administered 2016-10-30 – 2016-10-31 (×2): 0.5 mg via ORAL
  Filled 2016-10-30 (×2): qty 1

## 2016-10-30 MED ORDER — TAMSULOSIN HCL 0.4 MG PO CAPS
0.4000 mg | ORAL_CAPSULE | Freq: Every day | ORAL | Status: DC
  Administered 2016-10-30: 0.4 mg via ORAL
  Filled 2016-10-30: qty 1

## 2016-10-30 MED ORDER — HYDROMORPHONE HCL 1 MG/ML IJ SOLN
0.5000 mg | INTRAMUSCULAR | Status: DC | PRN
  Administered 2016-10-30 (×2): 1 mg via INTRAVENOUS
  Filled 2016-10-30 (×2): qty 1

## 2016-10-30 MED ORDER — PROMETHAZINE HCL 25 MG/ML IJ SOLN: 6.2500 mg | INTRAMUSCULAR | Status: DC | PRN

## 2016-10-30 MED ORDER — LACTATED RINGERS IV SOLN
INTRAVENOUS | Status: DC | PRN
  Administered 2016-10-30: 07:00:00 via INTRAVENOUS

## 2016-10-30 MED ORDER — ONDANSETRON HCL 4 MG/2ML IJ SOLN: 4.0000 mg | INTRAMUSCULAR | Status: DC | PRN

## 2016-10-30 MED ORDER — RISPERIDONE 1 MG PO TABS
1.0000 mg | ORAL_TABLET | Freq: Two times a day (BID) | ORAL | Status: DC
  Administered 2016-10-30 – 2016-10-31 (×2): 1 mg via ORAL
  Filled 2016-10-30 (×2): qty 1

## 2016-10-30 MED ORDER — LEVETIRACETAM 500 MG/5ML IV SOLN
500.0000 mg | Freq: Once | INTRAVENOUS | Status: AC
  Administered 2016-10-30: 500 mg via INTRAVENOUS
  Filled 2016-10-30 (×2): qty 5

## 2016-10-30 MED ORDER — DEXAMETHASONE SODIUM PHOSPHATE 10 MG/ML IJ SOLN: 10.0000 mg | INTRAMUSCULAR | Status: DC

## 2016-10-30 MED ORDER — PANTOPRAZOLE SODIUM 40 MG PO TBEC
40.0000 mg | DELAYED_RELEASE_TABLET | Freq: Every day | ORAL | Status: DC
  Administered 2016-10-30: 40 mg via ORAL
  Filled 2016-10-30: qty 1

## 2016-10-30 SURGICAL SUPPLY — 64 items
ADH SKN CLS APL DERMABOND .7 (GAUZE/BANDAGES/DRESSINGS) ×1
APL SKNCLS STERI-STRIP NONHPOA (GAUZE/BANDAGES/DRESSINGS)
BAG DECANTER FOR FLEXI CONT (MISCELLANEOUS) ×3 IMPLANT
BENZOIN TINCTURE PRP APPL 2/3 (GAUZE/BANDAGES/DRESSINGS) ×1 IMPLANT
BLADE SURG 11 STRL SS (BLADE) ×3 IMPLANT
BUR ACORN 6.0 PRECISION (BURR) ×2 IMPLANT
BUR ACORN 6.0MM PRECISION (BURR) ×1
CANISTER SUCT 3000ML PPV (MISCELLANEOUS) ×3 IMPLANT
CARTRIDGE OIL MAESTRO DRILL (MISCELLANEOUS) ×1 IMPLANT
CATH VENTRICULAR 7CM (Shunt) ×2 IMPLANT
CLIP RANEY DISP (INSTRUMENTS) IMPLANT
CLOSURE WOUND 1/2 X4 (GAUZE/BANDAGES/DRESSINGS)
CORDS BIPOLAR (ELECTRODE) ×3 IMPLANT
DECANTER SPIKE VIAL GLASS SM (MISCELLANEOUS) ×3 IMPLANT
DERMABOND ADVANCED (GAUZE/BANDAGES/DRESSINGS) ×2
DERMABOND ADVANCED .7 DNX12 (GAUZE/BANDAGES/DRESSINGS) ×1 IMPLANT
DIFFUSER DRILL AIR PNEUMATIC (MISCELLANEOUS) ×3 IMPLANT
DRAPE INCISE IOBAN 85X60 (DRAPES) ×3 IMPLANT
DRAPE ORTHO SPLIT 77X108 STRL (DRAPES) ×6
DRAPE POUCH INSTRU U-SHP 10X18 (DRAPES) ×3 IMPLANT
DRAPE SURG 17X23 STRL (DRAPES) IMPLANT
DRAPE SURG ORHT 6 SPLT 77X108 (DRAPES) ×2 IMPLANT
DRSG OPSITE 4X5.5 SM (GAUZE/BANDAGES/DRESSINGS) ×4 IMPLANT
ELECT REM PT RETURN 9FT ADLT (ELECTROSURGICAL) ×3
ELECTRODE REM PT RTRN 9FT ADLT (ELECTROSURGICAL) ×1 IMPLANT
GAUZE SPONGE 4X4 12PLY STRL (GAUZE/BANDAGES/DRESSINGS) ×4 IMPLANT
GAUZE SPONGE 4X4 16PLY XRAY LF (GAUZE/BANDAGES/DRESSINGS) ×4 IMPLANT
GLOVE BIO SURGEON STRL SZ8 (GLOVE) ×3 IMPLANT
GLOVE EXAM NITRILE LRG STRL (GLOVE) IMPLANT
GLOVE EXAM NITRILE XL STR (GLOVE) IMPLANT
GLOVE EXAM NITRILE XS STR PU (GLOVE) IMPLANT
GLOVE INDICATOR 8.5 STRL (GLOVE) ×3 IMPLANT
GOWN STRL REUS W/ TWL LRG LVL3 (GOWN DISPOSABLE) ×1 IMPLANT
GOWN STRL REUS W/ TWL XL LVL3 (GOWN DISPOSABLE) ×1 IMPLANT
GOWN STRL REUS W/TWL 2XL LVL3 (GOWN DISPOSABLE) ×3 IMPLANT
GOWN STRL REUS W/TWL LRG LVL3 (GOWN DISPOSABLE) ×3
GOWN STRL REUS W/TWL XL LVL3 (GOWN DISPOSABLE) ×3
KIT BASIN OR (CUSTOM PROCEDURE TRAY) ×3 IMPLANT
KIT ROOM TURNOVER OR (KITS) ×3 IMPLANT
NDL HYPO 25X1 1.5 SAFETY (NEEDLE) ×1 IMPLANT
NEEDLE HYPO 25X1 1.5 SAFETY (NEEDLE) ×3 IMPLANT
NS IRRIG 1000ML POUR BTL (IV SOLUTION) ×3 IMPLANT
OIL CARTRIDGE MAESTRO DRILL (MISCELLANEOUS) ×3
PACK LAMINECTOMY NEURO (CUSTOM PROCEDURE TRAY) ×3 IMPLANT
PAD ARMBOARD 7.5X6 YLW CONV (MISCELLANEOUS) ×3 IMPLANT
PATTIES SURGICAL .5 X3 (DISPOSABLE) IMPLANT
PATTIES SURGICAL .75X.75 (GAUZE/BANDAGES/DRESSINGS) IMPLANT
SHEATH PERITONEAL INTRO 61 (MISCELLANEOUS) ×3 IMPLANT
SHUNT STRATA 11 SNAP REG (Shunt) ×2 IMPLANT
SPONGE LAP 4X18 X RAY DECT (DISPOSABLE) IMPLANT
SPONGE SURGIFOAM ABS GEL SZ50 (HEMOSTASIS) ×3 IMPLANT
STAPLER VISISTAT 35W (STAPLE) ×3 IMPLANT
STRIP CLOSURE SKIN 1/2X4 (GAUZE/BANDAGES/DRESSINGS) ×2 IMPLANT
SUT BONE WAX W31G (SUTURE) ×3 IMPLANT
SUT CHROMIC 3 0 PS 2 (SUTURE) ×2 IMPLANT
SUT SILK 0 TIES 10X30 (SUTURE) ×3 IMPLANT
SUT VIC AB 2-0 CT1 18 (SUTURE) ×3 IMPLANT
SUT VIC AB 3-0 SH 8-18 (SUTURE) ×2 IMPLANT
SUT VICRYL 4-0 PS2 18IN ABS (SUTURE) ×3 IMPLANT
SYR 5ML LL (SYRINGE) IMPLANT
TOWEL OR 17X24 6PK STRL BLUE (TOWEL DISPOSABLE) ×3 IMPLANT
TOWEL OR 17X26 10 PK STRL BLUE (TOWEL DISPOSABLE) ×3 IMPLANT
UNDERPAD 30X30 (UNDERPADS AND DIAPERS) ×3 IMPLANT
WATER STERILE IRR 1000ML POUR (IV SOLUTION) ×3 IMPLANT

## 2016-10-30 NOTE — Anesthesia Postprocedure Evaluation (Signed)
Anesthesia Post Note  Patient: Warren Danes  Procedure(s) Performed: Procedure(s) (LRB): Right Ventricular Peritoneal Shunt (N/A)  Patient location during evaluation: PACU Anesthesia Type: General Level of consciousness: awake and alert Pain management: pain level controlled Vital Signs Assessment: post-procedure vital signs reviewed and stable Respiratory status: spontaneous breathing, nonlabored ventilation, respiratory function stable and patient connected to nasal cannula oxygen Cardiovascular status: blood pressure returned to baseline and stable Postop Assessment: no signs of nausea or vomiting Anesthetic complications: no    Last Vitals:  Vitals:   10/30/16 1258 10/30/16 1300  BP:  (!) 143/72  Pulse: 93 88  Resp: 18 15  Temp:      Last Pain:  Vitals:   10/30/16 1258  TempSrc:   PainSc: Donora Esteen Delpriore

## 2016-10-30 NOTE — Transfer of Care (Signed)
Immediate Anesthesia Transfer of Care Note  Patient: [name]  Procedure(s) Performed: Procedure(s): Right Ventricular Peritoneal Shunt (N/A)  Patient Location: PACU  Anesthesia Type:General  Level of Consciousness: sedated  Airway & Oxygen Therapy: Patient Spontanous Breathing and Patient connected to face mask oxygen  Post-op Assessment: Report given to RN and Post -op Vital signs reviewed and stable  Post vital signs: Reviewed and stable  Last Vitals:  Vitals:   10/30/16 0625  BP: 119/70  Pulse: 85  Resp: 20  Temp: 36.9 C    Last Pain:  Vitals:   10/30/16 0625  TempSrc: Oral         Complications: No apparent anesthesia complications

## 2016-10-30 NOTE — Op Note (Signed)
Preoperative diagnosis: Normal pressure hydrocephalus  Postoperative diagnosis: Same  Procedure: Right parietal ventriculoperitoneal shunt placement utilizing the Medtronic strata 2 programmable shunt system set to a pressure setting of 1-1.5.  Surgeon: Dominica Severin Niamh Rada  Anesthesia: Gen.  EBL: L  History of present illness: 60 year old gentleman with severe ventricular megaly workup was CSF studies if shown no evidence of infection or inflammatory process or malignancy serial taps with high-volume lumbar puncture is confirmed significant improvement in clinical dizziness balance difficulties and memory. Diagnosis was consistent with normal pressure hydrocephalus so I recommended ventricular peritoneal shunt placement I extensively went over the risks and benefits of the operation with the patient as well as perioperative course expectations of outcome and alternatives of surgery and he understood and agreed to proceed forward.  Operative procedure: The patient was brought into the or was induced under general anesthesia positioned supine with a shoulder bump under his were right shoulder head turned the left right parietal bur hole entry site was shaved prepped and draped as well as his right side of his chest and a subcostal area on the right side. This was all shaved prepped and draped in routine sterile fashion 2 incisions were infiltrated with 10 mL lidocaine with epi both parietally and the right initially opened up the parietal incision and drilled a burr hole coagulated the dura and packed this with Gelfoam then I opened up the subcostal incision to identify the external oblique and divided it identified the peritoneal wall sharply incised this and creatinine confirm that I was in the perineal cavity. Utilizing hemostats to control the margins of the hole and made and the peritoneum then packed this with a bacitracin sponge then utilizing the shunt passer I tunneled from the parietal incision to the  abdominal incision then I passed the perineal portion of the catheter from the parietal incision to the abdominal incision and removed the sheath of the passer. Then created pocket for the valve at this point I collected with her again incised in a cruciate fashion at 7 cm ventricular catheter passed a directly into the ventricle on the first pass with clear CSF egressed. I then snapped into the snap shunt assembly with a Medtronics strata 2 valve confirmed flow through the distal peritoneal portion of the catheter prior to placing the. A portion Through the peritoneal hole [place] the peritoneal hole with a 3-0 chromic in a purse string fashion closing abdominal incision with an with interrupted Vicryls and Dermabond then I anchored the valve assembly in place and closed the parietal incision with 2-0 interrupted Vicryl's and staples in the skin. Wounds were dressed patient recovered in stable condition. At the end of case all needle counts and sponge counts were correct.

## 2016-10-30 NOTE — Progress Notes (Signed)
Pharmacy Antibiotic Note  [name] is a 60 y.o. male admitted on 10/30/2016 with normal pressure hydrocephalus.  Pharmacy has been consulted for vancomycin dosing for 24 hours fur surgical prophylaxis s/p placement of VP shunt.  Vanc 1 gm given preop at 0725. Wt 89 kg, AF, creat 1.   Plan: vancomycin 1 gm IV q12 hr x 3 doses to cover x 24 hours post op Pharmacy to sign off  Weight: 196 lb (88.9 kg)  Temp (24hrs), Avg:97.8 F (36.6 C), Min:97 F (36.1 C), Max:98.5 F (36.9 C)   Recent Labs Lab 10/28/16 1412  WBC 11.4*  CREATININE 1.00    Estimated Creatinine Clearance: 89.9 mL/min (by C-G formula based on SCr of 1 mg/dL).    Allergies  Allergen Reactions  . Penicillins Hives and Other (See Comments)    Has patient had a PCN reaction causing immediate rash, facial/tongue/throat swelling, SOB or lightheadedness with hypotension: No Has patient had a PCN reaction causing severe rash involving mucus membranes or skin necrosis: No Has patient had a PCN reaction that required hospitalization No Has patient had a PCN reaction occurring within the last 10 years: No If all of the above answers are "NO", then may proceed with Cephalosporin use.   Eudelia Bunch, Pharm.D. [id number] 10/30/2016 1:11 PM

## 2016-10-30 NOTE — H&P (Signed)
[name] is an 60 y.o. male.   Chief Complaint: Headaches HPI: 60 year old with some concentration memory difficulty and difficulty walking and headaches that after 2 high-volume lumbar punctures significantly improved with imaging showing ventricular regular. Diagnosis was made of normal pressure hydrocephalus. So we have recommended ventriculoperitoneal shunting. We've extensively gone over the risks and benefits of the operation with the patient as well as perioperative course expectations of outcome and alternatives of surgery and he understood and agreed to proceed forward.  Past Medical History:  Diagnosis Date  . Anxiety   . Bipolar disorder ([place])   . Chronic back pain   . Depression   . Diabetes mellitus without complication ([place])   . Diabetic neuropathy ([place])   . DVT (deep venous thrombosis) ([place])   . Enlarged prostate   . Headache   . Hepatitis    Hepatitis C - has been treated with Harvoni (cleared in July, 2017)  . History of colon polyps   . Mental disorder    bipolar  . Peripheral vascular disease ([place])   . Pneumonia    hx of-about 90yrs ago  . Schizophrenia [facility])     Past Surgical History:  Procedure Laterality Date  . APPENDECTOMY     early 41's  . BACK SURGERY  05/2011   x 5  . blood clot in groin  early 80's  . CARPAL TUNNEL RELEASE     left  . CERVICAL SPINE SURGERY  2009  . COLONOSCOPY    . HERNIA REPAIR     early 2000's  . SHOULDER SURGERY  70's   right    Family History  Problem Relation Age of Onset  . Heart attack Father   . Anesthesia problems Neg Hx   . Hypotension Neg Hx   . Malignant hyperthermia Neg Hx   . Pseudochol deficiency Neg Hx    Social History:  reports that he has been smoking Cigarettes.  He has a 40.00 pack-year smoking history. He has never used smokeless tobacco. He reports that he does not drink alcohol or use drugs.  Allergies:  Allergies  Allergen Reactions  . Penicillins Hives and Other (See Comments)    Has  patient had a PCN reaction causing immediate rash, facial/tongue/throat swelling, SOB or lightheadedness with hypotension: No Has patient had a PCN reaction causing severe rash involving mucus membranes or skin necrosis: No Has patient had a PCN reaction that required hospitalization No Has patient had a PCN reaction occurring within the last 10 years: No If all of the above answers are "NO", then may proceed with Cephalosporin use.    Medications Prior to Admission  Medication Sig Dispense Refill  . benztropine (COGENTIN) 0.5 MG tablet Take 1 tablet (0.5 mg total) by mouth 2 (two) times daily. 60 tablet 0  . buPROPion (WELLBUTRIN SR) 150 MG 12 hr tablet Take 150 mg by mouth daily.     . cholecalciferol (VITAMIN D) 1000 units tablet Take 1,000 Units by mouth daily.    . citalopram (CELEXA) 10 MG tablet Take 1 tablet (10 mg total) by mouth at bedtime. (Patient taking differently: Take 20 mg by mouth at bedtime. ) 30 tablet 0  . gabapentin (NEURONTIN) 100 MG capsule Take 2 capsules (200 mg total) by mouth 2 (two) times daily. 120 capsule 0  . metFORMIN (GLUCOPHAGE) 500 MG tablet Take 500 mg by mouth 2 (two) times daily with a meal.    . risperiDONE (RISPERDAL) 1 MG tablet Take 1 tablet (  1 mg total) by mouth 2 (two) times daily. 60 tablet 0  . sitaGLIPtin (JANUVIA) 100 MG tablet Take 100 mg by mouth daily with breakfast.    . tamsulosin (FLOMAX) 0.4 MG CAPS capsule Take 0.4 mg by mouth daily after supper.     . traZODone (DESYREL) 100 MG tablet Take 200 mg by mouth at bedtime as needed.       Results for orders placed or performed during the hospital encounter of 10/30/16 (from the past 48 hour(s))  Glucose, capillary     Status: Abnormal   Collection Time: 10/30/16  6:23 AM  Result Value Ref Range   Glucose-Capillary 153 (H) 65 - 99 mg/dL   Comment 1 Notify RN    Comment 2 Document in Chart    No results found.  Review of Systems  Neurological: Positive for dizziness and headaches.     Blood pressure 119/70, pulse 85, temperature 98.5 F (36.9 C), temperature source Oral, resp. rate 20, weight 88.9 kg (196 lb), SpO2 100 %. Physical Exam  Constitutional: He is oriented to person, place, and time. He appears well-developed and well-nourished.  HENT:  Head: Normocephalic.  Eyes: Pupils are equal, round, and reactive to light.  Neck: Normal range of motion.  Respiratory: Effort normal.  GI: Soft. Bowel sounds are normal.  Neurological: He is alert and oriented to person, place, and time. He has normal strength. GCS eye subscore is 4. GCS verbal subscore is 5. GCS motor subscore is 6.  Strength is 5 out of 5 upper and lower extremities pupils are equal extremity movements are intact cranial nerves otherwise intact     Assessment/Plan 60 year old gentleman presents for ventriculoperitoneal shunt placement  Rocklin Soderquist P, MD 10/30/2016, 7:18 AM

## 2016-10-30 NOTE — Anesthesia Procedure Notes (Signed)
Procedure Name: Intubation Date/Time: 10/30/2016 7:50 AM Performed by: Maude Leriche D Pre-anesthesia Checklist: Patient identified, Emergency Drugs available, Suction available, Patient being monitored and Timeout performed Patient Re-evaluated:Patient Re-evaluated prior to inductionOxygen Delivery Method: Circle system utilized Preoxygenation: Pre-oxygenation with 100% oxygen Intubation Type: IV induction Ventilation: Mask ventilation without difficulty and Oral airway inserted - appropriate to patient size Laryngoscope Size: Miller and 2 Grade View: Grade I Tube size: 7.5 mm Number of attempts: 1 Airway Equipment and Method: Stylet Placement Confirmation: ETT inserted through vocal cords under direct vision,  positive ETCO2 and breath sounds checked- equal and bilateral Secured at: 23 cm Tube secured with: Tape Dental Injury: Teeth and Oropharynx as per pre-operative assessment

## 2016-10-30 NOTE — Anesthesia Preprocedure Evaluation (Addendum)
Anesthesia Evaluation  Patient identified by MRN, date of birth, ID band Patient awake    Reviewed: Allergy & Precautions, NPO status , Patient's Chart, lab work & pertinent test results  Airway Mallampati: II  TM Distance: >3 FB Neck ROM: Full    Dental  (+) Edentulous Upper, Missing, Poor Dentition,    Pulmonary Current Smoker,    breath sounds clear to auscultation       Cardiovascular + Peripheral Vascular Disease   Rhythm:Regular Rate:Normal     Neuro/Psych  Headaches, PSYCHIATRIC DISORDERS Anxiety Depression Bipolar Disorder Schizophrenia  Neuromuscular disease    GI/Hepatic negative GI ROS, (+) Hepatitis -, C  Endo/Other  diabetes, Type 2, Oral Hypoglycemic Agents  Renal/GU   negative genitourinary   Musculoskeletal  (+) Arthritis , Osteoarthritis,    Abdominal   Peds negative pediatric ROS (+)  Hematology negative hematology ROS (+)   Anesthesia Other Findings   Reproductive/Obstetrics negative OB ROS                           Lab Results  Component Value Date   WBC 11.4 (H) 10/28/2016   HGB 13.8 10/28/2016   HCT 39.4 10/28/2016   MCV 91.6 10/28/2016   PLT 257 10/28/2016   Lab Results  Component Value Date   CREATININE 1.00 10/28/2016   BUN <5 (L) 10/28/2016   NA 140 10/28/2016   K 3.7 10/28/2016   CL 104 10/28/2016   CO2 26 10/28/2016   Lab Results  Component Value Date   INR 1.12 06/18/2016   INR 0.97 03/30/2014   INR 1.03 11/26/2011   EKG: normal sinus rhythm.   Anesthesia Physical Anesthesia Plan  ASA: II  Anesthesia Plan: General   Post-op Pain Management:    Induction: Intravenous  Airway Management Planned: Oral ETT  Additional Equipment:   Intra-op Plan:   Post-operative Plan: Extubation in OR  Informed Consent: I have reviewed the patients History and Physical, chart, labs and discussed the procedure including the risks, benefits and  alternatives for the proposed anesthesia with the patient or authorized representative who has indicated his/her understanding and acceptance.   Dental advisory given  Plan Discussed with: CRNA, Anesthesiologist and Surgeon  Anesthesia Plan Comments:        Anesthesia Quick Evaluation

## 2016-10-30 NOTE — Progress Notes (Signed)
Visited with pt and family member in rm. Provided spiritual/emotional support and prayer. Chaplain available for follow-up.    10/30/16 1500  Clinical Encounter Type  Visited With Patient and family together  Visit Type Initial;Psychological support;Spiritual support;Social support  Referral From Chaplain  Spiritual Encounters  Spiritual Needs Prayer;Emotional  Stress Factors  Patient Stress Factors Health changes;Loss of control  Family Stress Factors Family relationships;Health changes;Loss of control   Gerrit Heck, Chaplain

## 2016-10-31 LAB — GLUCOSE, CAPILLARY
GLUCOSE-CAPILLARY: 149 mg/dL — AB (ref 65–99)
Glucose-Capillary: 187 mg/dL — ABNORMAL HIGH (ref 65–99)

## 2016-10-31 MED ORDER — HYDROCODONE-ACETAMINOPHEN 5-325 MG PO TABS: 1.0000 | ORAL_TABLET | ORAL | 0 refills | Status: DC | PRN

## 2016-10-31 NOTE — Progress Notes (Signed)
Doing well Minimal headache Moving arms and legs well Bandage c/d/i D/c

## 2016-10-31 NOTE — Progress Notes (Signed)
Discharge instructions, RX's and follow up appts explained and provided to patient, copy given to patient's [facility] nurse who assist with meds verbalized understanding. Patient left floor via wheelchair accompanied by staff no c/o pain or shortness on breath.  Haley Roza, Tivis Ringer, RN

## 2016-10-31 NOTE — Discharge Summary (Signed)
Date of Admission: 10/30/2016  Date of Discharge: 10/31/16  Preoperative diagnosis: Normal pressure hydrocephalus  Postoperative diagnosis: Same  Procedure: Right parietal ventriculoperitoneal shunt placement utilizing the Medtronic strata 2 programmable shunt system set to a pressure setting of 1-1.5.  Attending: Kary Kos, MD  Hospital Course:  The patient was admitted for the above listed operation and had an uncomplicated post-operative course.  They were discharged in stable condition.  Follow up: 3 weeks    Medication List    TAKE these medications   benztropine 0.5 MG tablet Commonly known as:  COGENTIN Take 1 tablet (0.5 mg total) by mouth 2 (two) times daily.   buPROPion 150 MG 12 hr tablet Commonly known as:  WELLBUTRIN SR Take 150 mg by mouth daily.   cholecalciferol 1000 units tablet Commonly known as:  VITAMIN D Take 1,000 Units by mouth daily.   citalopram 10 MG tablet Commonly known as:  CELEXA Take 1 tablet (10 mg total) by mouth at bedtime. What changed:  how much to take   gabapentin 100 MG capsule Commonly known as:  NEURONTIN Take 2 capsules (200 mg total) by mouth 2 (two) times daily.   HYDROcodone-acetaminophen 5-325 MG tablet Commonly known as:  NORCO/VICODIN Take 1 tablet by mouth every 4 (four) hours as needed for moderate pain.   metFORMIN 500 MG tablet Commonly known as:  GLUCOPHAGE Take 500 mg by mouth 2 (two) times daily with a meal.   risperiDONE 1 MG tablet Commonly known as:  RISPERDAL Take 1 tablet (1 mg total) by mouth 2 (two) times daily.   sitaGLIPtin 100 MG tablet Commonly known as:  JANUVIA Take 100 mg by mouth daily with breakfast.   tamsulosin 0.4 MG Caps capsule Commonly known as:  FLOMAX Take 0.4 mg by mouth daily after supper.   traZODone 100 MG tablet Commonly known as:  DESYREL Take 200 mg by mouth at bedtime as needed.

## 2016-11-02 ENCOUNTER — Encounter (HOSPITAL_COMMUNITY): Payer: Self-pay | Admitting: Neurosurgery

## 2016-11-09 ENCOUNTER — Ambulatory Visit (INDEPENDENT_AMBULATORY_CARE_PROVIDER_SITE_OTHER): Payer: Medicare Other | Admitting: Specialist

## 2016-11-13 ENCOUNTER — Other Ambulatory Visit: Payer: Self-pay | Admitting: Neurosurgery

## 2016-11-13 DIAGNOSIS — G912 (Idiopathic) normal pressure hydrocephalus: Secondary | ICD-10-CM

## 2016-12-22 ENCOUNTER — Other Ambulatory Visit: Payer: Self-pay

## 2016-12-22 ENCOUNTER — Ambulatory Visit
Admission: RE | Admit: 2016-12-22 | Discharge: 2016-12-22 | Disposition: A | Discharge status: Home / Self Care | Payer: Medicare Other | Source: Ambulatory Visit | Attending: Neurosurgery | Admitting: Neurosurgery

## 2016-12-22 ENCOUNTER — Inpatient Hospital Stay: Admission: RE | Admit: 2016-12-22 | Payer: Self-pay | Source: Ambulatory Visit

## 2016-12-22 DIAGNOSIS — G912 (Idiopathic) normal pressure hydrocephalus: Secondary | ICD-10-CM

## 2016-12-23 ENCOUNTER — Other Ambulatory Visit: Payer: Self-pay | Admitting: Neurosurgery

## 2016-12-23 DIAGNOSIS — G912 (Idiopathic) normal pressure hydrocephalus: Secondary | ICD-10-CM

## 2017-01-05 ENCOUNTER — Ambulatory Visit: Payer: Medicare Other | Admitting: Neurology

## 2017-01-05 ENCOUNTER — Ambulatory Visit
Admission: RE | Admit: 2017-01-05 | Discharge: 2017-01-05 | Disposition: A | Discharge status: Home / Self Care | Payer: Medicare Other | Source: Ambulatory Visit | Attending: Neurosurgery | Admitting: Neurosurgery

## 2017-01-05 DIAGNOSIS — G912 (Idiopathic) normal pressure hydrocephalus: Secondary | ICD-10-CM

## 2017-01-07 ENCOUNTER — Encounter: Payer: Self-pay | Admitting: Neurology

## 2017-02-13 ENCOUNTER — Emergency Department (HOSPITAL_COMMUNITY): Payer: Medicare Other

## 2017-02-13 ENCOUNTER — Emergency Department (HOSPITAL_COMMUNITY)
Admission: EM | Admit: 2017-02-13 | Discharge: 2017-02-14 | Disposition: A | Discharge status: Home / Self Care | Payer: Medicare Other | Attending: Emergency Medicine | Admitting: Emergency Medicine

## 2017-02-13 ENCOUNTER — Encounter (HOSPITAL_COMMUNITY): Payer: Self-pay

## 2017-02-13 DIAGNOSIS — R51 Headache: Secondary | ICD-10-CM

## 2017-02-13 DIAGNOSIS — T85618A Breakdown (mechanical) of other specified internal prosthetic devices, implants and grafts, initial encounter: Secondary | ICD-10-CM

## 2017-02-13 DIAGNOSIS — R519 Headache, unspecified: Secondary | ICD-10-CM

## 2017-02-13 DIAGNOSIS — G4489 Other headache syndrome: Secondary | ICD-10-CM | POA: Insufficient documentation

## 2017-02-13 DIAGNOSIS — Z8669 Personal history of other diseases of the nervous system and sense organs: Secondary | ICD-10-CM | POA: Insufficient documentation

## 2017-02-13 DIAGNOSIS — E1140 Type 2 diabetes mellitus with diabetic neuropathy, unspecified: Secondary | ICD-10-CM | POA: Insufficient documentation

## 2017-02-13 DIAGNOSIS — Z982 Presence of cerebrospinal fluid drainage device: Secondary | ICD-10-CM

## 2017-02-13 DIAGNOSIS — F17210 Nicotine dependence, cigarettes, uncomplicated: Secondary | ICD-10-CM | POA: Diagnosis not present

## 2017-02-13 DIAGNOSIS — Z7984 Long term (current) use of oral hypoglycemic drugs: Secondary | ICD-10-CM | POA: Diagnosis not present

## 2017-02-13 LAB — BASIC METABOLIC PANEL
ANION GAP: 9 (ref 5–15)
BUN: 6 mg/dL (ref 6–20)
CALCIUM: 9.9 mg/dL (ref 8.9–10.3)
CO2: 27 mmol/L (ref 22–32)
CREATININE: 1.27 mg/dL — AB (ref 0.61–1.24)
Chloride: 107 mmol/L (ref 101–111)
Glucose, Bld: 181 mg/dL — ABNORMAL HIGH (ref 65–99)
Potassium: 4.1 mmol/L (ref 3.5–5.1)
SODIUM: 143 mmol/L (ref 135–145)

## 2017-02-13 LAB — CBC WITH DIFFERENTIAL/PLATELET
BASOS ABS: 0.1 10*3/uL (ref 0.0–0.1)
BASOS PCT: 1 %
Eosinophils Absolute: 0.4 10*3/uL (ref 0.0–0.7)
Eosinophils Relative: 4 %
HEMATOCRIT: 40.2 % (ref 39.0–52.0)
HEMOGLOBIN: 13.7 g/dL (ref 13.0–17.0)
LYMPHS PCT: 43 %
Lymphs Abs: 3.8 10*3/uL (ref 0.7–4.0)
MCH: 31.6 pg (ref 26.0–34.0)
MCHC: 34.1 g/dL (ref 30.0–36.0)
MCV: 92.6 fL (ref 78.0–100.0)
Monocytes Absolute: 0.6 10*3/uL (ref 0.1–1.0)
Monocytes Relative: 7 %
NEUTROS ABS: 3.9 10*3/uL (ref 1.7–7.7)
NEUTROS PCT: 45 %
PLATELETS: 254 10*3/uL (ref 150–400)
RBC: 4.34 MIL/uL (ref 4.22–5.81)
RDW: 12.1 % (ref 11.5–15.5)
WBC: 8.7 10*3/uL (ref 4.0–10.5)

## 2017-02-13 MED ORDER — PROCHLORPERAZINE EDISYLATE 5 MG/ML IJ SOLN
10.0000 mg | Freq: Once | INTRAMUSCULAR | Status: AC
  Administered 2017-02-13: 10 mg via INTRAVENOUS
  Filled 2017-02-13: qty 2

## 2017-02-13 MED ORDER — SODIUM CHLORIDE 0.9 % IV BOLUS (SEPSIS)
1000.0000 mL | Freq: Once | INTRAVENOUS | Status: AC
  Administered 2017-02-13: 1000 mL via INTRAVENOUS

## 2017-02-13 MED ORDER — FENTANYL CITRATE (PF) 100 MCG/2ML IJ SOLN
50.0000 ug | Freq: Once | INTRAMUSCULAR | Status: AC
  Administered 2017-02-13: 50 ug via INTRAVENOUS
  Filled 2017-02-13: qty 2

## 2017-02-13 MED ORDER — VALPROATE SODIUM 500 MG/5ML IV SOLN
500.0000 mg | Freq: Once | INTRAVENOUS | Status: AC
  Administered 2017-02-13: 500 mg via INTRAVENOUS
  Filled 2017-02-13: qty 5

## 2017-02-13 MED ORDER — MAGNESIUM SULFATE 2 GM/50ML IV SOLN
2.0000 g | Freq: Once | INTRAVENOUS | Status: AC
  Administered 2017-02-13: 2 g via INTRAVENOUS
  Filled 2017-02-13: qty 50

## 2017-02-13 MED ORDER — DIPHENHYDRAMINE HCL 50 MG/ML IJ SOLN
25.0000 mg | Freq: Once | INTRAMUSCULAR | Status: AC
Start: 2017-02-13 — End: 2017-02-13
  Administered 2017-02-13: 25 mg via INTRAVENOUS
  Filled 2017-02-13: qty 1

## 2017-02-13 MED ORDER — ONDANSETRON HCL 4 MG/2ML IJ SOLN
4.0000 mg | Freq: Once | INTRAMUSCULAR | Status: AC
  Administered 2017-02-13: 4 mg via INTRAVENOUS
  Filled 2017-02-13: qty 2

## 2017-02-13 MED ORDER — OXYCODONE HCL 5 MG PO TABS: 5.0000 mg | ORAL_TABLET | ORAL | 0 refills | Status: DC | PRN

## 2017-02-13 NOTE — ED Triage Notes (Signed)
Pt presents to the ed with ems for a migraine for three weeks, he has a cerebral shunt placed three months ago for fluid, his physician suggested him come here due to the migraine being persistant, alert and oriented

## 2017-02-13 NOTE — Consult Note (Signed)
Reason for Consult: Headaches rule out shunt malfunction Referring Physician: Emergency department  [name] is an 61 y.o. male.  HPI: 61 year old gentleman with a long-standing history hydrocephalus underwent VP shunt placement about 3 months ago patient underwent very well after 3-4 weeks ago with significant symptom medical improvement of headaches and balance. However over the last 2-3 weeks had some progression worsening headaches and worsening balance difficult. The chills has a little bit of nausea  Past Medical History:  Diagnosis Date  . Anxiety   . Bipolar disorder ([name])   . Chronic back pain   . Depression   . Diabetes mellitus without complication ([name])   . Diabetic neuropathy ([name])   . DVT (deep venous thrombosis) ([name])   . Enlarged prostate   . Headache   . Hepatitis    Hepatitis C - has been treated with Harvoni (cleared in July, 2017)  . History of colon polyps   . Mental disorder    bipolar  . Peripheral vascular disease ([name])   . Pneumonia    hx of-about 66yr ago  . Schizophrenia ([name]     Past Surgical History:  Procedure Laterality Date  . APPENDECTOMY     early 974's . BACK SURGERY  05/2011   x 5  . blood clot in groin  early 80's  . CARPAL TUNNEL RELEASE     left  . CERVICAL SPINE SURGERY  2009  . COLONOSCOPY    . HERNIA REPAIR     early 2000's  . SHOULDER SURGERY  70's   right  . VENTRICULOPERITONEAL SHUNT N/A 10/30/2016   Procedure: Right Ventricular Peritoneal Shunt;  Surgeon: [name];  [place] [facility]  Service: Neurosurgery;  Laterality: N/A;    Family History  Problem Relation Age of Onset  . Heart attack Father   . Anesthesia problems Neg Hx   . Hypotension Neg Hx   . Malignant hyperthermia Neg Hx   . Pseudochol deficiency Neg Hx     Social History:  reports that he has been smoking Cigarettes.  He has a 40.00 pack-year smoking history. He has never used smokeless tobacco. He reports that he does not drink alcohol or use  drugs.  Allergies:  Allergies  Allergen Reactions  . Penicillins Hives and Other (See Comments)    Has patient had a PCN reaction causing immediate rash, facial/tongue/throat swelling, SOB or lightheadedness with hypotension: No Has patient had a PCN reaction causing severe rash involving mucus membranes or skin necrosis: No Has patient had a PCN reaction that required hospitalization No Has patient had a PCN reaction occurring within the last 10 years: No If all of the above answers are "NO", then may proceed with Cephalosporin use.    Medications: I have reviewed the patient's current medications.  Results for orders placed or performed during the hospital encounter of 02/13/17 (from the past 48 hour(s))  CBC with Differential     Status: None   Collection Time: 02/13/17  2:06 PM  Result Value Ref Range   WBC 8.7 4.0 - 10.5 K/uL   RBC 4.34 4.22 - 5.81 MIL/uL   Hemoglobin 13.7 13.0 - 17.0 g/dL   HCT 40.2 39.0 - 52.0 %   MCV 92.6 78.0 - 100.0 fL   MCH 31.6 26.0 - 34.0 pg   MCHC 34.1 30.0 - 36.0 g/dL   RDW 12.1 11.5 - 15.5 %   Platelets 254 150 - 400 K/uL   Neutrophils Relative % 45 %  Neutro Abs 3.9 1.7 - 7.7 K/uL   Lymphocytes Relative 43 %   Lymphs Abs 3.8 0.7 - 4.0 K/uL   Monocytes Relative 7 %   Monocytes Absolute 0.6 0.1 - 1.0 K/uL   Eosinophils Relative 4 %   Eosinophils Absolute 0.4 0.0 - 0.7 K/uL   Basophils Relative 1 %   Basophils Absolute 0.1 0.0 - 0.1 K/uL  Basic metabolic panel     Status: Abnormal   Collection Time: 02/13/17  2:06 PM  Result Value Ref Range   Sodium 143 135 - 145 mmol/L   Potassium 4.1 3.5 - 5.1 mmol/L   Chloride 107 101 - 111 mmol/L   CO2 27 22 - 32 mmol/L   Glucose, Bld 181 (H) 65 - 99 mg/dL   BUN 6 6 - 20 mg/dL   Creatinine, Ser 1.27 (H) 0.61 - 1.24 mg/dL   Calcium 9.9 8.9 - 10.3 mg/dL   GFR calc non Af Amer >60 >60 mL/min   GFR calc Af Amer >60 >60 mL/min    Comment: (NOTE) The eGFR has been calculated using the CKD EPI  equation. This calculation has not been validated in all clinical situations. eGFR's persistently <60 mL/min signify possible Chronic Kidney Disease.    Anion gap 9 5 - 15    Dg Skull 1-3 Views  Result Date: 02/13/2017 CLINICAL DATA:  Ventriculoperitoneal shunt malfunction. EXAM: SKULL - 1-3 VIEW COMPARISON:  Head CT 02/13/2017. FINDINGS: The right ventricular peritoneal shunt is intact with no discontinuity seen. Cervical spine fixation hardware. IMPRESSION: Intact ventriculoperitoneal shunt. Electronically Signed   By: [name] M.D.   On: 02/13/2017 16:10   Dg Chest 1 View  Result Date: 02/13/2017 CLINICAL DATA:  Shunt malfunction EXAM: CHEST 1 VIEW COMPARISON:  Chest radiograph 06/27/2016 FINDINGS: Shunt catheter noted along the anterior RIGHT chest wall. Catheter is continuous. Lungs are clear. IMPRESSION: Continuous shunt catheter along the RIGHT chest wall. Electronically Signed   By: [name] M.D.   On: 02/13/2017 16:09   Dg Cervical Spine 1 View  Result Date: 02/13/2017 CLINICAL DATA:  Ventriculoperitoneal shunt malfunction. EXAM: CERVICAL SPINE 1 VIEW COMPARISON:  Skull radiographs obtained today. FINDINGS: Intact right ventriculoperitoneal shunt. Cervical spine fixation hardware. IMPRESSION: Intact right ventriculoperitoneal shunt. Electronically Signed   By: [name] M.D.   On: 02/13/2017 16:11   Dg Abd 1 View  Result Date: 02/13/2017 CLINICAL DATA:  Ventriculoperitoneal shunt malfunction. EXAM: ABDOMEN - 1 VIEW COMPARISON:  Lumbar spine radiographs dated 06/20/2016. FINDINGS: Right ventriculoperitoneal shunt tip in the right mid abdomen. No shin discontinuity seen. Normal bowel gas pattern with prominent stool in the colon. Stable lumbar spine postoperative changes with fixation hardware. IMPRESSION: Intact ventriculoperitoneal shunt.  Prominent stool. Electronically Signed   By: [name] M.D.   On: 02/13/2017 16:08   Ct Head Wo Contrast  Result Date:  02/13/2017 CLINICAL DATA:  Headache.  Shunt. EXAM: CT HEAD WITHOUT CONTRAST TECHNIQUE: Contiguous axial images were obtained from the base of the skull through the vertex without intravenous contrast. COMPARISON:  01/05/2017 and 07/01/2016 FINDINGS: Brain: Right posterior parietal ventriculostomy catheter with tip adjacent the midline unchanged. Ventricles, cisterns and other CSF spaces are within normal. There is chronic ischemic microvascular disease. There is no mass, mass effect, shift of midline structures or acute hemorrhage. No evidence of acute infarction. Visualized portions of the shunt are intact. Vascular: Calcified plaque over the cavernous segment of the internal carotid arteries bilaterally. Skull: Within normal. Sinuses/Orbits: Subtle mucosal membrane  thickening involving the frontal, ethmoid and sphenoid sinuses. Orbits are normal. Other: None. IMPRESSION: No acute intracranial findings. Right posterior parietal ventriculostomy catheter unchanged. Chronic ischemic microvascular disease. Electronically Signed   By: Marin Olp M.D.   On: 02/13/2017 15:49    Review of Systems  Gastrointestinal: Positive for nausea.  Neurological: Positive for headaches.  All other systems reviewed and are negative.  Blood pressure 121/73, pulse 79, temperature 98.5 F (36.9 C), temperature source Oral, resp. rate 18, SpO2 94 %. Physical Exam  Constitutional: He is oriented to person, place, and time. He appears well-developed and well-nourished.  HENT:  Head: Normocephalic.  Eyes: Pupils are equal, round, and reactive to light.  Neck: Normal range of motion.  GI: Soft. Bowel sounds are normal.  Neurological: He is alert and oriented to person, place, and time. He has normal strength. GCS eye subscore is 4. GCS verbal subscore is 5. GCS motor subscore is 6.  Patient is awake alert pupils are equal neck struck movements are intact strength is 5 out of 5 upper and lower extremities     Assessment/Plan: 61 year old gentleman with a shunt placed 2-3 months ago states CT scan shows excellent functioning of the shunt no sign of increased ventricular size clearly improved from preop. No sign of subdural fluid collection. Shunt pumps and refills well I interrogated his shunt gave me a reading of 1.5 setting on the [employer] strata 2 valve I moved this down to 1.0 on strict elevation of call us symptoms do not get better and plan on follow up on Thursday. Charge in the ER with pain medication and scheduled follow-up.  Sukhdeep Wieting P 02/13/2017, 6:38 PM

## 2017-02-13 NOTE — ED Notes (Signed)
Patient transported to CT 

## 2017-02-13 NOTE — Discharge Instructions (Signed)
Follow up as scheduled, return for worsening headache.

## 2017-02-13 NOTE — ED Provider Notes (Signed)
[facility] DEPT Provider Note   CSN: [id number] Arrival date & time: 02/13/17  1343     History   Chief Complaint Chief Complaint  Patient presents with  . Headache/shunt    HPI [name] is a 61 y.o. male.  61 yo M with a chief complaint of a headache. This is described as diffuse cerebral worst headache of his wife. Patient denies fevers has had some pain on the right side of his neck where his shunt catheter is. He had a shunt placed about 3 months ago by Dr. Saintclair Halsted. Has had no issues to 3 weeks ago. Headache is been persistent nothing seems to make it better. Worse with bright lights and loud noises. Has had some nausea and vomiting. Patient having some difficulty with ambulation denies unilateral weakness or difficulty with speech.   The history is provided by the patient.  Headache   This is a new problem. The current episode started more than 1 week ago. The problem occurs constantly. The problem has not changed since onset.The headache is associated with bright light and loud noise. The pain is located in the bilateral region. The quality of the pain is described as dull. The pain is at a severity of 8/10. The pain is severe. The pain does not radiate. Pertinent negatives include no fever, no palpitations, no shortness of breath and no vomiting. He has tried acetaminophen, cold packs, aspirin and NSAIDs for the symptoms. The treatment provided no relief.    Past Medical History:  Diagnosis Date  . Anxiety   . Bipolar disorder ([place])   . Chronic back pain   . Depression   . Diabetes mellitus without complication ([place])   . Diabetic neuropathy ([place])   . DVT (deep venous thrombosis) ([place])   . Enlarged prostate   . Headache   . Hepatitis    Hepatitis C - has been treated with Harvoni (cleared in July, 2017)  . History of colon polyps   . Mental disorder    bipolar  . Peripheral vascular disease ([place])   . Pneumonia    hx of-about 88yrs ago  . Schizophrenia [facility])       Patient Active Problem List   Diagnosis Date Noted  . NPH (normal pressure hydrocephalus) 10/30/2016  . Normal pressure hydrocephalus 09/01/2016  . Bilateral sciatica 09/01/2016  . Altered mental status 07/01/2016  . Acute encephalopathy 07/01/2016  . Hydrocephalus in adult 07/01/2016  . Shaking spells 06/01/2016  . Gait disturbance 04/10/2015  . Numbness 04/10/2015  . Lumbar radicular pain 04/10/2015  . Lumbar spondylosis 04/10/2015  . Bipolar I disorder with depression, severe ([place]) 06/30/2014  . Suicidal ideations 06/30/2014  . Ulcer of lower limb, unspecified 02/20/2014  . CELLULITIS AND ABSCESS OF LEG EXCEPT FOOT 11/08/2007    Past Surgical History:  Procedure Laterality Date  . APPENDECTOMY     early 20's  . BACK SURGERY  05/2011   x 5  . blood clot in groin  early 80's  . CARPAL TUNNEL RELEASE     left  . CERVICAL SPINE SURGERY  2009  . COLONOSCOPY    . HERNIA REPAIR     early 2000's  . SHOULDER SURGERY  70's   right  . VENTRICULOPERITONEAL SHUNT N/A 10/30/2016   Procedure: Right Ventricular Peritoneal Shunt;  Surgeon: Kary Kos, MD;  Location: [facility];  Service: Neurosurgery;  Laterality: N/A;       Home Medications    Prior to Admission medications  Medication Sig Start Date End Date Taking? Authorizing Provider  cholecalciferol (VITAMIN D) 1000 units tablet Take 1,000 Units by mouth daily.   Yes Historical Provider, MD  citalopram (CELEXA) 10 MG tablet Take 1 tablet (10 mg total) by mouth at bedtime. Patient taking differently: Take 20 mg by mouth at bedtime.  07/05/16  Yes Albertine Patricia, MD  gabapentin (NEURONTIN) 100 MG capsule Take 2 capsules (200 mg total) by mouth 2 (two) times daily. 07/05/16  Yes Albertine Patricia, MD  metFORMIN (GLUCOPHAGE) 500 MG tablet Take 500 mg by mouth 2 (two) times daily with a meal.   Yes Historical Provider, MD  QUEtiapine (SEROQUEL) 100 MG tablet Take 100 mg by mouth at bedtime.   Yes Historical Provider, MD   sitaGLIPtin (JANUVIA) 100 MG tablet Take 100 mg by mouth daily with breakfast.   Yes Historical Provider, MD  tamsulosin (FLOMAX) 0.4 MG CAPS capsule Take 0.4 mg by mouth daily after supper.    Yes Historical Provider, MD  traZODone (DESYREL) 100 MG tablet Take 200 mg by mouth at bedtime as needed.  10/14/16  Yes Historical Provider, MD  benztropine (COGENTIN) 0.5 MG tablet Take 1 tablet (0.5 mg total) by mouth 2 (two) times daily. Patient not taking: Reported on 02/13/2017 07/05/16   Silver Huguenin Elgergawy, MD  HYDROcodone-acetaminophen (NORCO/VICODIN) 5-325 MG tablet Take 1 tablet by mouth every 4 (four) hours as needed for moderate pain. Patient not taking: Reported on 02/13/2017 10/31/16   Kevan Ny Ditty, MD  oxyCODONE (ROXICODONE) 5 MG immediate release tablet Take 1 tablet (5 mg total) by mouth every 4 (four) hours as needed for severe pain. 02/13/17   Deno Etienne, DO  risperiDONE (RISPERDAL) 1 MG tablet Take 1 tablet (1 mg total) by mouth 2 (two) times daily. Patient not taking: Reported on 02/13/2017 07/05/16   Albertine Patricia, MD    Family History Family History  Problem Relation Age of Onset  . Heart attack Father   . Anesthesia problems Neg Hx   . Hypotension Neg Hx   . Malignant hyperthermia Neg Hx   . Pseudochol deficiency Neg Hx     Social History Social History  Substance Use Topics  . Smoking status: Current Every Day Smoker    Packs/day: 1.00    Years: 40.00    Types: Cigarettes  . Smokeless tobacco: Never Used     Comment: pt states that he is ready to quit smoking but thinks he will need patches advised him to speak with his PCP which he is seaing this Thursday 3/26  . Alcohol use No     Allergies   Penicillins   Review of Systems Review of Systems  Constitutional: Negative for chills and fever.  HENT: Negative for congestion and facial swelling.   Eyes: Negative for discharge and visual disturbance.  Respiratory: Negative for shortness of breath.    Cardiovascular: Negative for chest pain and palpitations.  Gastrointestinal: Negative for abdominal pain, diarrhea and vomiting.  Musculoskeletal: Positive for gait problem. Negative for arthralgias and myalgias.  Skin: Negative for color change and rash.  Neurological: Negative for tremors, syncope and headaches.  Psychiatric/Behavioral: Negative for confusion and dysphoric mood.     Physical Exam Updated Vital Signs BP 105/69 (BP Location: Right Arm)   Pulse 83   Temp 98.5 F (36.9 C) (Oral)   Resp 18   SpO2 97%   Physical Exam  Constitutional: He is oriented to person, place, and time. He appears well-developed and  well-nourished.  HENT:  Head: Normocephalic and atraumatic.  Eyes: EOM are normal. Pupils are equal, round, and reactive to light.  Neck: Normal range of motion. Neck supple. No JVD present.  Cardiovascular: Normal rate and regular rhythm.  Exam reveals no gallop and no friction rub.   No murmur heard. Pulmonary/Chest: No respiratory distress. He has no wheezes.  Abdominal: He exhibits no distension. There is no rebound and no guarding.  Musculoskeletal: Normal range of motion.  Neurological: He is alert and oriented to person, place, and time. He has normal strength. No cranial nerve deficit or sensory deficit. Gait abnormal. Coordination normal. GCS eye subscore is 4. GCS verbal subscore is 5. GCS motor subscore is 6.  Wide based gait   Skin: No rash noted. No pallor.  Psychiatric: He has a normal mood and affect. His behavior is normal.  Nursing note and vitals reviewed.    ED Treatments / Results  Labs (all labs ordered are listed, but only abnormal results are displayed) Labs Reviewed  BASIC METABOLIC PANEL - Abnormal; Notable for the following:       Result Value   Glucose, Bld 181 (*)    Creatinine, Ser 1.27 (*)    All other components within normal limits  CBC WITH DIFFERENTIAL/PLATELET  CBC WITH DIFFERENTIAL/PLATELET    EKG  EKG  Interpretation None       Radiology Dg Skull 1-3 Views  Result Date: 02/13/2017 CLINICAL DATA:  Ventriculoperitoneal shunt malfunction. EXAM: SKULL - 1-3 VIEW COMPARISON:  Head CT 02/13/2017. FINDINGS: The right ventricular peritoneal shunt is intact with no discontinuity seen. Cervical spine fixation hardware. IMPRESSION: Intact ventriculoperitoneal shunt. Electronically Signed   By: [name] M.D.   On: 02/13/2017 16:10   Dg Chest 1 View  Result Date: 02/13/2017 CLINICAL DATA:  Shunt malfunction EXAM: CHEST 1 VIEW COMPARISON:  Chest radiograph 06/27/2016 FINDINGS: Shunt catheter noted along the anterior RIGHT chest wall. Catheter is continuous. Lungs are clear. IMPRESSION: Continuous shunt catheter along the RIGHT chest wall. Electronically Signed   By: [name] M.D.   On: 02/13/2017 16:09   Dg Cervical Spine 1 View  Result Date: 02/13/2017 CLINICAL DATA:  Ventriculoperitoneal shunt malfunction. EXAM: CERVICAL SPINE 1 VIEW COMPARISON:  Skull radiographs obtained today. FINDINGS: Intact right ventriculoperitoneal shunt. Cervical spine fixation hardware. IMPRESSION: Intact right ventriculoperitoneal shunt. Electronically Signed   By: [name] M.D.   On: 02/13/2017 16:11   Dg Abd 1 View  Result Date: 02/13/2017 CLINICAL DATA:  Ventriculoperitoneal shunt malfunction. EXAM: ABDOMEN - 1 VIEW COMPARISON:  Lumbar spine radiographs dated 06/20/2016. FINDINGS: Right ventriculoperitoneal shunt tip in the right mid abdomen. No shin discontinuity seen. Normal bowel gas pattern with prominent stool in the colon. Stable lumbar spine postoperative changes with fixation hardware. IMPRESSION: Intact ventriculoperitoneal shunt.  Prominent stool. Electronically Signed   By: [name] M.D.   On: 02/13/2017 16:08   Ct Head Wo Contrast  Result Date: 02/13/2017 CLINICAL DATA:  Headache.  Shunt. EXAM: CT HEAD WITHOUT CONTRAST TECHNIQUE: Contiguous axial images were obtained from the base of the  skull through the vertex without intravenous contrast. COMPARISON:  01/05/2017 and 07/01/2016 FINDINGS: Brain: Right posterior parietal ventriculostomy catheter with tip adjacent the midline unchanged. Ventricles, cisterns and other CSF spaces are within normal. There is chronic ischemic microvascular disease. There is no mass, mass effect, shift of midline structures or acute hemorrhage. No evidence of acute infarction. Visualized portions of the shunt are intact. Vascular: Calcified plaque over the  cavernous segment of the internal carotid arteries bilaterally. Skull: Within normal. Sinuses/Orbits: Subtle mucosal membrane thickening involving the frontal, ethmoid and sphenoid sinuses. Orbits are normal. Other: None. IMPRESSION: No acute intracranial findings. Right posterior parietal ventriculostomy catheter unchanged. Chronic ischemic microvascular disease. Electronically Signed   By: Marin Olp M.D.   On: 02/13/2017 15:49    Procedures Procedures (including critical care time)  Medications Ordered in ED Medications  sodium chloride 0.9 % bolus 1,000 mL (0 mLs Intravenous Stopped 02/13/17 1734)  ondansetron (ZOFRAN) injection 4 mg (4 mg Intravenous Given 02/13/17 1412)  fentaNYL (SUBLIMAZE) injection 50 mcg (50 mcg Intravenous Given 02/13/17 1412)  prochlorperazine (COMPAZINE) injection 10 mg (10 mg Intravenous Given 02/13/17 1546)  diphenhydrAMINE (BENADRYL) injection 25 mg (25 mg Intravenous Given 02/13/17 1546)  sodium chloride 0.9 % bolus 1,000 mL (0 mLs Intravenous Stopped 02/13/17 1658)  valproate (DEPACON) 500 mg in dextrose 5 % 50 mL IVPB (0 mg Intravenous Stopped 02/13/17 1831)  magnesium sulfate IVPB 2 g 50 mL (0 g Intravenous Stopped 02/13/17 1710)     Initial Impression / Assessment and Plan / ED Course  I have reviewed the triage vital signs and the nursing notes.  Pertinent labs & imaging results that were available during my care of the patient were reviewed by me and considered in  my medical decision making (see chart for details).     61 yo M with a cc of a headache.  Some difficulty with gait.  [place] a couple days ago.  No headaches previously.  Shunt placed 3 weeks ago. Wide based gait on exam. Imaging negative.    The patient had ongoing 10 at 10 headache despite Compazine and Benadryl Depakote magnesium. I discussed the case with Dr. Saintclair Halsted, neurosurgery. He came to the bedside and evaluated the patient's shunt. He felt that it somehow migrated to more of a 1.5 setting. He dialed it back to 1. We'll have him follow-up in his clinic on Thursday.  7:34 PM:  I have discussed the diagnosis/risks/treatment options with the patient and believe the pt to be eligible for discharge home to follow-up with Neurosurgery. We also discussed returning to the ED immediately if new or worsening sx occur. We discussed the sx which are most concerning (e.g., sudden worsening pain, fever, inability to tolerate by mouth) that necessitate immediate return. Medications administered to the patient during their visit and any new prescriptions provided to the patient are listed below.  Medications given during this visit Medications  sodium chloride 0.9 % bolus 1,000 mL (0 mLs Intravenous Stopped 02/13/17 1734)  ondansetron (ZOFRAN) injection 4 mg (4 mg Intravenous Given 02/13/17 1412)  fentaNYL (SUBLIMAZE) injection 50 mcg (50 mcg Intravenous Given 02/13/17 1412)  prochlorperazine (COMPAZINE) injection 10 mg (10 mg Intravenous Given 02/13/17 1546)  diphenhydrAMINE (BENADRYL) injection 25 mg (25 mg Intravenous Given 02/13/17 1546)  sodium chloride 0.9 % bolus 1,000 mL (0 mLs Intravenous Stopped 02/13/17 1658)  valproate (DEPACON) 500 mg in dextrose 5 % 50 mL IVPB (0 mg Intravenous Stopped 02/13/17 1831)  magnesium sulfate IVPB 2 g 50 mL (0 g Intravenous Stopped 02/13/17 1710)     The patient appears reasonably screen and/or stabilized for discharge and I doubt any other medical condition or other [facility]  requiring further screening, evaluation, or treatment in the ED at this time prior to discharge.    Final Clinical Impressions(s) / ED Diagnoses   Final diagnoses:  Headache disorder  History of brain shunt    New  Prescriptions New Prescriptions   OXYCODONE (ROXICODONE) 5 MG IMMEDIATE RELEASE TABLET    Take 1 tablet (5 mg total) by mouth every 4 (four) hours as needed for severe pain.     Deno Etienne, DO 02/13/17 1934

## 2017-05-24 ENCOUNTER — Encounter (HOSPITAL_COMMUNITY): Payer: Self-pay | Admitting: *Deleted

## 2017-05-24 ENCOUNTER — Emergency Department (HOSPITAL_COMMUNITY)
Admission: EM | Admit: 2017-05-24 | Discharge: 2017-05-24 | Disposition: A | Discharge status: Home / Self Care | Payer: Medicare Other | Attending: Emergency Medicine | Admitting: Emergency Medicine

## 2017-05-24 ENCOUNTER — Emergency Department (HOSPITAL_COMMUNITY): Payer: Medicare Other

## 2017-05-24 DIAGNOSIS — Y929 Unspecified place or not applicable: Secondary | ICD-10-CM | POA: Insufficient documentation

## 2017-05-24 DIAGNOSIS — W01198A Fall on same level from slipping, tripping and stumbling with subsequent striking against other object, initial encounter: Secondary | ICD-10-CM | POA: Diagnosis not present

## 2017-05-24 DIAGNOSIS — Z7984 Long term (current) use of oral hypoglycemic drugs: Secondary | ICD-10-CM | POA: Insufficient documentation

## 2017-05-24 DIAGNOSIS — S3992XA Unspecified injury of lower back, initial encounter: Secondary | ICD-10-CM | POA: Diagnosis present

## 2017-05-24 DIAGNOSIS — Y939 Activity, unspecified: Secondary | ICD-10-CM | POA: Diagnosis not present

## 2017-05-24 DIAGNOSIS — Y92039 Unspecified place in apartment as the place of occurrence of the external cause: Secondary | ICD-10-CM | POA: Insufficient documentation

## 2017-05-24 DIAGNOSIS — F17210 Nicotine dependence, cigarettes, uncomplicated: Secondary | ICD-10-CM | POA: Diagnosis not present

## 2017-05-24 DIAGNOSIS — W19XXXA Unspecified fall, initial encounter: Secondary | ICD-10-CM

## 2017-05-24 DIAGNOSIS — Y999 Unspecified external cause status: Secondary | ICD-10-CM | POA: Diagnosis not present

## 2017-05-24 DIAGNOSIS — R200 Anesthesia of skin: Secondary | ICD-10-CM | POA: Insufficient documentation

## 2017-05-24 DIAGNOSIS — S300XXA Contusion of lower back and pelvis, initial encounter: Secondary | ICD-10-CM | POA: Insufficient documentation

## 2017-05-24 DIAGNOSIS — E1140 Type 2 diabetes mellitus with diabetic neuropathy, unspecified: Secondary | ICD-10-CM | POA: Insufficient documentation

## 2017-05-24 DIAGNOSIS — S20221A Contusion of right back wall of thorax, initial encounter: Secondary | ICD-10-CM

## 2017-05-24 LAB — COMPREHENSIVE METABOLIC PANEL
ALK PHOS: 96 U/L (ref 38–126)
ALT: 12 U/L — ABNORMAL LOW (ref 17–63)
ANION GAP: 7 (ref 5–15)
AST: 16 U/L (ref 15–41)
Albumin: 3.6 g/dL (ref 3.5–5.0)
BUN: 10 mg/dL (ref 6–20)
CALCIUM: 9.3 mg/dL (ref 8.9–10.3)
CO2: 28 mmol/L (ref 22–32)
CREATININE: 1.02 mg/dL (ref 0.61–1.24)
Chloride: 105 mmol/L (ref 101–111)
Glucose, Bld: 120 mg/dL — ABNORMAL HIGH (ref 65–99)
Potassium: 3.7 mmol/L (ref 3.5–5.1)
SODIUM: 140 mmol/L (ref 135–145)
TOTAL PROTEIN: 7.4 g/dL (ref 6.5–8.1)
Total Bilirubin: 0.4 mg/dL (ref 0.3–1.2)

## 2017-05-24 LAB — CBC WITH DIFFERENTIAL/PLATELET
Basophils Absolute: 0.1 10*3/uL (ref 0.0–0.1)
Basophils Relative: 1 %
EOS ABS: 0.4 10*3/uL (ref 0.0–0.7)
EOS PCT: 3 %
HCT: 37.9 % — ABNORMAL LOW (ref 39.0–52.0)
HEMOGLOBIN: 13 g/dL (ref 13.0–17.0)
LYMPHS ABS: 3.5 10*3/uL (ref 0.7–4.0)
Lymphocytes Relative: 32 %
MCH: 32.2 pg (ref 26.0–34.0)
MCHC: 34.3 g/dL (ref 30.0–36.0)
MCV: 93.8 fL (ref 78.0–100.0)
MONOS PCT: 7 %
Monocytes Absolute: 0.7 10*3/uL (ref 0.1–1.0)
NEUTROS PCT: 57 %
Neutro Abs: 6.4 10*3/uL (ref 1.7–7.7)
Platelets: 258 10*3/uL (ref 150–400)
RBC: 4.04 MIL/uL — ABNORMAL LOW (ref 4.22–5.81)
RDW: 11.9 % (ref 11.5–15.5)
WBC: 11 10*3/uL — ABNORMAL HIGH (ref 4.0–10.5)

## 2017-05-24 MED ORDER — OXYCODONE-ACETAMINOPHEN 5-325 MG PO TABS
2.0000 | ORAL_TABLET | Freq: Once | ORAL | Status: AC
  Administered 2017-05-24: 2 via ORAL
  Filled 2017-05-24: qty 2

## 2017-05-24 MED ORDER — HYDROMORPHONE HCL 1 MG/ML IJ SOLN
1.0000 mg | Freq: Once | INTRAMUSCULAR | Status: AC
  Administered 2017-05-24: 1 mg via INTRAVENOUS
  Filled 2017-05-24: qty 1

## 2017-05-24 MED ORDER — MORPHINE SULFATE (PF) 4 MG/ML IV SOLN
4.0000 mg | Freq: Once | INTRAVENOUS | Status: AC
  Administered 2017-05-24: 4 mg via INTRAVENOUS
  Filled 2017-05-24: qty 1

## 2017-05-24 MED ORDER — CYCLOBENZAPRINE HCL 5 MG PO TABS: 5.0000 mg | ORAL_TABLET | Freq: Three times a day (TID) | ORAL | 0 refills | Status: DC | PRN

## 2017-05-24 MED ORDER — OXYCODONE-ACETAMINOPHEN 5-325 MG PO TABS: 1.0000 | ORAL_TABLET | Freq: Four times a day (QID) | ORAL | 0 refills | Status: DC | PRN

## 2017-05-24 MED ORDER — CYCLOBENZAPRINE HCL 10 MG PO TABS
5.0000 mg | ORAL_TABLET | Freq: Once | ORAL | Status: AC
  Administered 2017-05-24: 5 mg via ORAL
  Filled 2017-05-24: qty 1

## 2017-05-24 MED ORDER — SODIUM CHLORIDE 0.9 % IV BOLUS (SEPSIS)
1000.0000 mL | Freq: Once | INTRAVENOUS | Status: AC
  Administered 2017-05-24: 1000 mL via INTRAVENOUS

## 2017-05-24 MED ORDER — IBUPROFEN 600 MG PO TABS: 600.0000 mg | ORAL_TABLET | Freq: Four times a day (QID) | ORAL | 0 refills | Status: DC | PRN

## 2017-05-24 NOTE — ED Notes (Signed)
Pt ambulated down hallway and back

## 2017-05-24 NOTE — ED Notes (Signed)
Patient transported to X-ray 

## 2017-05-24 NOTE — ED Notes (Signed)
Patient verbalized understanding of discharge instructions and denies any further needs or questions at this time. VS stable. Patient ambulatory with steady gait.  

## 2017-05-24 NOTE — ED Provider Notes (Signed)
[facility] DEPT Provider Note   CSN: [id number] Arrival date & time: 05/24/17  1151     History   Chief Complaint Chief Complaint  Patient presents with  . Fall    HPI [name] is a 61 y.o. male history of bipolar, diabetes, chronic back pain with previous back surgeries here presenting with fall. States that he got up this morning and there was a water leak in the apartment above and he slipped and fell on his buttocks and did hit his head. Patient is complaining of back pain and some paresthesias down the right leg. He did fall and hit his head but had no LOC.   The history is provided by the patient.    Past Medical History:  Diagnosis Date  . Anxiety   . Bipolar disorder ([place])   . Chronic back pain   . Depression   . Diabetes mellitus without complication ([place])   . Diabetic neuropathy ([place])   . DVT (deep venous thrombosis) ([place])   . Enlarged prostate   . Headache   . Hepatitis    Hepatitis C - has been treated with Harvoni (cleared in July, 2017)  . History of colon polyps   . Mental disorder    bipolar  . Peripheral vascular disease ([place])   . Pneumonia    hx of-about 55yrs ago  . Schizophrenia [facility])     Patient Active Problem List   Diagnosis Date Noted  . NPH (normal pressure hydrocephalus) 10/30/2016  . Normal pressure hydrocephalus 09/01/2016  . Bilateral sciatica 09/01/2016  . Altered mental status 07/01/2016  . Acute encephalopathy 07/01/2016  . Hydrocephalus in adult 07/01/2016  . Shaking spells 06/01/2016  . Gait disturbance 04/10/2015  . Numbness 04/10/2015  . Lumbar radicular pain 04/10/2015  . Lumbar spondylosis 04/10/2015  . Bipolar I disorder with depression, severe ([place]) 06/30/2014  . Suicidal ideations 06/30/2014  . Ulcer of lower limb, unspecified 02/20/2014  . CELLULITIS AND ABSCESS OF LEG EXCEPT FOOT 11/08/2007    Past Surgical History:  Procedure Laterality Date  . APPENDECTOMY     early 3's  . BACK SURGERY  05/2011   x 5  . blood clot in groin  early 80's  . CARPAL TUNNEL RELEASE     left  . CERVICAL SPINE SURGERY  2009  . COLONOSCOPY    . HERNIA REPAIR     early 2000's  . SHOULDER SURGERY  70's   right  . VENTRICULOPERITONEAL SHUNT N/A 10/30/2016   Procedure: Right Ventricular Peritoneal Shunt;  Surgeon: Kary Kos, MD;  Location: [facility];  Service: Neurosurgery;  Laterality: N/A;       Home Medications    Prior to Admission medications   Medication Sig Start Date End Date Taking? Authorizing Provider  benztropine (COGENTIN) 0.5 MG tablet Take 1 tablet (0.5 mg total) by mouth 2 (two) times daily. Patient not taking: Reported on 02/13/2017 07/05/16   Elgergawy, Silver Huguenin, MD  cholecalciferol (VITAMIN D) 1000 units tablet Take 1,000 Units by mouth daily.    [provider]  citalopram (CELEXA) 10 MG tablet Take 1 tablet (10 mg total) by mouth at bedtime. Patient taking differently: Take 20 mg by mouth at bedtime.  07/05/16   Elgergawy, Silver Huguenin, MD  gabapentin (NEURONTIN) 100 MG capsule Take 2 capsules (200 mg total) by mouth 2 (two) times daily. 07/05/16   Elgergawy, Silver Huguenin, MD  HYDROcodone-acetaminophen (NORCO/VICODIN) 5-325 MG tablet Take 1 tablet by mouth every 4 (four) hours  as needed for moderate pain. Patient not taking: Reported on 02/13/2017 10/31/16   Ditty, Kevan Ny, MD  metFORMIN (GLUCOPHAGE) 500 MG tablet Take 500 mg by mouth 2 (two) times daily with a meal.    [provider]  oxyCODONE (ROXICODONE) 5 MG immediate release tablet Take 1 tablet (5 mg total) by mouth every 4 (four) hours as needed for severe pain. 02/13/17   Deno Etienne, DO  QUEtiapine (SEROQUEL) 100 MG tablet Take 100 mg by mouth at bedtime.    [provider]  risperiDONE (RISPERDAL) 1 MG tablet Take 1 tablet (1 mg total) by mouth 2 (two) times daily. Patient not taking: Reported on 02/13/2017 07/05/16   Elgergawy, Silver Huguenin, MD  sitaGLIPtin (JANUVIA) 100 MG tablet Take 100 mg by mouth daily with  breakfast.    [provider]  tamsulosin (FLOMAX) 0.4 MG CAPS capsule Take 0.4 mg by mouth daily after supper.     [provider]  traZODone (DESYREL) 100 MG tablet Take 200 mg by mouth at bedtime as needed.  10/14/16   [provider]    Family History Family History  Problem Relation Age of Onset  . Heart attack Father   . Anesthesia problems Neg Hx   . Hypotension Neg Hx   . Malignant hyperthermia Neg Hx   . Pseudochol deficiency Neg Hx     Social History Social History  Substance Use Topics  . Smoking status: Current Every Day Smoker    Packs/day: 1.00    Years: 40.00    Types: Cigarettes  . Smokeless tobacco: Never Used     Comment: pt states that he is ready to quit smoking but thinks he will need patches advised him to speak with his PCP which he is seaing this Thursday 3/26  . Alcohol use No     Allergies   Penicillins   Review of Systems Review of Systems  Musculoskeletal: Positive for back pain.  Neurological: Positive for numbness.  All other systems reviewed and are negative.    Physical Exam Updated Vital Signs BP (!) 144/82 (BP Location: Right Arm)   Pulse (!) 102   Temp 98.5 F (36.9 C) (Oral)   Resp 16   Ht 6' (1.829 m)   Wt 85.3 kg (188 lb)   SpO2 97%   BMI 25.50 kg/m   Physical Exam  Constitutional:  Uncomfortable   HENT:  Head: Normocephalic.  No obvious scalp hematoma   Eyes: EOM are normal. Pupils are equal, round, and reactive to light.  Neck:  C collar in place   Cardiovascular: Normal rate, regular rhythm and normal heart sounds.   Pulmonary/Chest: Effort normal and breath sounds normal. No respiratory distress. He has no wheezes. He has no rales.  Abdominal: Soft. Bowel sounds are normal.  No bruising or ecchymosis   Musculoskeletal:  + lower lumbar tenderness. Previous surgery lower lumbar area. No obvious deformity. Pelvis stable. Nl ROM R hip. + straight leg raise R side, neurovascular intact  bilateral lower extremities otherwise.   Neurological: He is alert.  Skin: Skin is warm.  Psychiatric: He has a normal mood and affect.  Nursing note and vitals reviewed.    ED Treatments / Results  Labs (all labs ordered are listed, but only abnormal results are displayed) Labs Reviewed  CBC WITH DIFFERENTIAL/PLATELET  COMPREHENSIVE METABOLIC PANEL    EKG  EKG Interpretation None       Radiology No results found.  Procedures Procedures (including critical care  time)  Medications Ordered in ED Medications  sodium chloride 0.9 % bolus 1,000 mL (not administered)  morphine 4 MG/ML injection 4 mg (not administered)     Initial Impression / Assessment and Plan / ED Course  I have reviewed the triage vital signs and the nursing notes.  Pertinent labs & imaging results that were available during my care of the patient were reviewed by me and considered in my medical decision making (see chart for details).     DOMANI BAKOS is a 61 y.o. male here with fall. Mechanical fall with head injury, back injury. Has positive straight leg raise on the right, likely sciatica symptoms from injury. Will get CT head/neck, CT lumbar spine, xrays. Will give pain meds.   3:17 PM CT head/neck unremarkable. CT lumbar showed no spinal fractures. Felt better with pain meds. Able to ambulate. Likely muscle strain. Will dc home with motrin, flexeril, prn percocet.    Final Clinical Impressions(s) / ED Diagnoses   Final diagnoses:  None    New Prescriptions New Prescriptions   No medications on file     Drenda Freeze, MD 05/24/17 [phone]

## 2017-05-24 NOTE — ED Triage Notes (Signed)
Patient states he got up to go to the bathroom and apparently there was a water leak in the apartment above hime and it ran down into his apartment. States he slipped and fell landed on his buttocks and then hit his head. C/o dizziness numbness in his right leg and numbness left hand., Bilateral grips equal. Moves all ext. Pupils equal and reactive

## 2017-05-24 NOTE — Discharge Instructions (Signed)
Take motrin for pain.   Take flexeril for muscle spasms.   Take percocet for severe pain. Do NOT drive with it,.   Expect to have headaches and back pain for several days.   See your doctor  Return to ER if you have severe back pain, worse right numbness, weakness, vomiting, severe headaches, chest pain.

## 2017-05-24 NOTE — ED Notes (Signed)
Patient ambulated down hall with MD present. Pt unsteady on feet at first, but after taking a few steps, ambulated without difficulty.

## 2017-06-03 ENCOUNTER — Encounter (HOSPITAL_COMMUNITY): Payer: Self-pay | Admitting: Emergency Medicine

## 2017-06-03 ENCOUNTER — Emergency Department (HOSPITAL_COMMUNITY)
Admission: EM | Admit: 2017-06-03 | Discharge: 2017-06-03 | Disposition: A | Discharge status: Home / Self Care | Payer: Medicare Other | Attending: Emergency Medicine | Admitting: Emergency Medicine

## 2017-06-03 DIAGNOSIS — Z7984 Long term (current) use of oral hypoglycemic drugs: Secondary | ICD-10-CM | POA: Diagnosis not present

## 2017-06-03 DIAGNOSIS — S299XXA Unspecified injury of thorax, initial encounter: Secondary | ICD-10-CM | POA: Insufficient documentation

## 2017-06-03 DIAGNOSIS — E119 Type 2 diabetes mellitus without complications: Secondary | ICD-10-CM | POA: Insufficient documentation

## 2017-06-03 DIAGNOSIS — Y990 Civilian activity done for income or pay: Secondary | ICD-10-CM | POA: Insufficient documentation

## 2017-06-03 DIAGNOSIS — R200 Anesthesia of skin: Secondary | ICD-10-CM | POA: Insufficient documentation

## 2017-06-03 DIAGNOSIS — N39498 Other specified urinary incontinence: Secondary | ICD-10-CM | POA: Insufficient documentation

## 2017-06-03 DIAGNOSIS — F17210 Nicotine dependence, cigarettes, uncomplicated: Secondary | ICD-10-CM | POA: Diagnosis not present

## 2017-06-03 DIAGNOSIS — Y929 Unspecified place or not applicable: Secondary | ICD-10-CM | POA: Diagnosis not present

## 2017-06-03 DIAGNOSIS — Y9301 Activity, walking, marching and hiking: Secondary | ICD-10-CM | POA: Diagnosis not present

## 2017-06-03 DIAGNOSIS — W19XXXD Unspecified fall, subsequent encounter: Secondary | ICD-10-CM

## 2017-06-03 DIAGNOSIS — R51 Headache: Secondary | ICD-10-CM | POA: Diagnosis not present

## 2017-06-03 DIAGNOSIS — W01198D Fall on same level from slipping, tripping and stumbling with subsequent striking against other object, subsequent encounter: Secondary | ICD-10-CM | POA: Diagnosis not present

## 2017-06-03 DIAGNOSIS — Z79899 Other long term (current) drug therapy: Secondary | ICD-10-CM | POA: Insufficient documentation

## 2017-06-03 DIAGNOSIS — E1140 Type 2 diabetes mellitus with diabetic neuropathy, unspecified: Secondary | ICD-10-CM | POA: Diagnosis not present

## 2017-06-03 MED ORDER — HYDROCODONE-ACETAMINOPHEN 5-325 MG PO TABS
2.0000 | ORAL_TABLET | Freq: Once | ORAL | Status: AC
  Administered 2017-06-03: 2 via ORAL
  Filled 2017-06-03: qty 2

## 2017-06-03 MED ORDER — HYDROCODONE-ACETAMINOPHEN 5-325 MG PO TABS: 1.0000 | ORAL_TABLET | Freq: Four times a day (QID) | ORAL | 0 refills | Status: DC | PRN

## 2017-06-03 NOTE — ED Triage Notes (Signed)
Pt. Restated, I did slip and fall on June 25 which is how my back pain started.

## 2017-06-03 NOTE — Discharge Instructions (Signed)
Please call Dr. Garth Bigness office tomorrow morning to schedule follow-up appointment. If you develop worsening symptoms, including bowel incontinence or new falls or injuries, please return to the emergency department for re-evaluation.

## 2017-06-03 NOTE — ED Triage Notes (Signed)
Pt. Stated,My back started hurting in June no Injuries. I came here and was told to come back if no better. Pain is now going to my legs . The pain is so bad I can't hardly do anything.

## 2017-06-03 NOTE — ED Provider Notes (Signed)
[facility] DEPT Provider Note   CSN: [id number] Arrival date & time: 06/03/17  1327  By signing my name below, I, [name], attest that this documentation has been prepared under the direction and in the presence of non-physician practitioner, Lyniah Fujita A., PA-C. Electronically Signed: Theresia Owen, ED Scribe. 06/03/17. 3:55 PM.  History   Chief Complaint Chief Complaint  Patient presents with  . Back Pain  . Leg Pain   The history is provided by the patient. No language interpreter was used.   HPI Comments: [name] is a 61 y.o. male who presents to the Emergency Department complaining of worsening, mid-lower back pain s/p a mechanical, ground-level fall that took place 10 days ago. Pt states he fell backwards, landing on his back and hitting his head. No LOC. Pt was seen in the ED after the fall and states his pain has become more severe in the last 4 days. No new falls. Pt describes pain as a burning sensation in his middle back. Pt reports associated headache, numbness in his right leg, weakness and urinary incontinence that occurred yesterday. Pt's PMHx includes back surgery and DM. Pt was admitted to the hospital for normal pressure hydrocephalus on 07/01/16 - 07/05/16; VP shunt was placed in Dec of 2017. Pt denies bowel incontinence or any other complaints at this time.  Past Medical History:  Diagnosis Date  . Anxiety   . Bipolar disorder ([place])   . Chronic back pain   . Depression   . Diabetes mellitus without complication ([place])   . Diabetic neuropathy ([place])   . DVT (deep venous thrombosis) ([place])   . Enlarged prostate   . Headache   . Hepatitis    Hepatitis C - has been treated with Harvoni (cleared in July, 2017)  . History of colon polyps   . Mental disorder    bipolar  . Peripheral vascular disease ([place])   . Pneumonia    hx of-about 53yrs ago  . Schizophrenia [facility])     Patient Active Problem List   Diagnosis Date Noted  . NPH (normal  pressure hydrocephalus) 10/30/2016  . Normal pressure hydrocephalus 09/01/2016  . Bilateral sciatica 09/01/2016  . Altered mental status 07/01/2016  . Acute encephalopathy 07/01/2016  . Hydrocephalus in adult 07/01/2016  . Shaking spells 06/01/2016  . Gait disturbance 04/10/2015  . Numbness 04/10/2015  . Lumbar radicular pain 04/10/2015  . Lumbar spondylosis 04/10/2015  . Bipolar I disorder with depression, severe ([place]) 06/30/2014  . Suicidal ideations 06/30/2014  . Ulcer of lower limb, unspecified 02/20/2014  . CELLULITIS AND ABSCESS OF LEG EXCEPT FOOT 11/08/2007    Past Surgical History:  Procedure Laterality Date  . APPENDECTOMY     early 24's  . BACK SURGERY  05/2011   x 5  . blood clot in groin  early 80's  . CARPAL TUNNEL RELEASE     left  . CERVICAL SPINE SURGERY  2009  . COLONOSCOPY    . HERNIA REPAIR     early 2000's  . SHOULDER SURGERY  70's   right  . VENTRICULOPERITONEAL SHUNT N/A 10/30/2016   Procedure: Right Ventricular Peritoneal Shunt;  Surgeon: Kary Kos, MD;  Location: [facility];  Service: Neurosurgery;  Laterality: N/A;       Home Medications    Prior to Admission medications   Medication Sig Start Date End Date Taking? Authorizing Provider  cholecalciferol (VITAMIN D) 1000 units tablet Take 1,000 Units by mouth daily.    [provider]  cyclobenzaprine (FLEXERIL) 5 MG tablet Take 1 tablet (5 mg total) by mouth 3 (three) times daily as needed for muscle spasms. 05/24/17   Drenda Freeze, MD  HYDROcodone-acetaminophen (NORCO/VICODIN) 5-325 MG tablet Take 1 tablet by mouth every 6 (six) hours as needed. 06/03/17   Emerly Prak A, PA-C  ibuprofen (ADVIL,MOTRIN) 600 MG tablet Take 1 tablet (600 mg total) by mouth every 6 (six) hours as needed. 05/24/17   Drenda Freeze, MD  metFORMIN (GLUCOPHAGE) 500 MG tablet Take 500 mg by mouth 2 (two) times daily with a meal.    [provider]  oxyCODONE-acetaminophen (PERCOCET) 5-325 MG tablet  Take 1 tablet by mouth every 6 (six) hours as needed. 05/24/17   Drenda Freeze, MD  paliperidone (INVEGA) 3 MG 24 hr tablet Take 3 mg by mouth at bedtime. 04/22/17   [provider]  QUEtiapine (SEROQUEL) 400 MG tablet Take 800 mg by mouth at bedtime. 05/14/17   [provider]  risperiDONE (RISPERDAL) 1 MG tablet Take 1 tablet (1 mg total) by mouth 2 (two) times daily. Patient not taking: Reported on 05/24/2017 07/05/16   Elgergawy, Silver Huguenin, MD  sertraline (ZOLOFT) 100 MG tablet Take 100 mg by mouth at bedtime.    [provider]  sitaGLIPtin (JANUVIA) 100 MG tablet Take 100 mg by mouth daily with breakfast.    [provider]  tamsulosin (FLOMAX) 0.4 MG CAPS capsule Take 0.4 mg by mouth daily after supper.     [provider]  traZODone (DESYREL) 150 MG tablet Take 150 mg by mouth at bedtime. 04/22/17   [provider]    Family History Family History  Problem Relation Age of Onset  . Heart attack Father   . Anesthesia problems Neg Hx   . Hypotension Neg Hx   . Malignant hyperthermia Neg Hx   . Pseudochol deficiency Neg Hx     Social History Social History  Substance Use Topics  . Smoking status: Current Every Day Smoker    Packs/day: 1.00    Years: 40.00    Types: Cigarettes  . Smokeless tobacco: Never Used     Comment: pt states that he is ready to quit smoking but thinks he will need patches advised him to speak with his PCP which he is seaing this Thursday 3/26  . Alcohol use No     Allergies   Penicillins   Review of Systems Review of Systems  Constitutional: Negative for activity change.  Respiratory: Negative for shortness of breath.   Cardiovascular: Negative for chest pain.  Gastrointestinal: Negative for abdominal pain.  Genitourinary: Positive for enuresis.  Musculoskeletal: Positive for back pain and myalgias.  Skin: Negative for rash.  Neurological: Positive for weakness, numbness and headaches.  Negative for syncope.   Physical Exam Updated Vital Signs BP (!) 118/56 (BP Location: Left Arm)   Pulse 88   Temp 98.3 F (36.8 C) (Oral)   Resp 16   SpO2 99%   Physical Exam  Constitutional: He is oriented to person, place, and time. He appears well-developed.  HENT:  Head: Normocephalic.  Eyes: Conjunctivae are normal.  Neck: Neck supple.  Cardiovascular: Normal rate, regular rhythm, normal heart sounds and intact distal pulses.  Exam reveals no gallop and no friction rub.   No murmur heard. Pulmonary/Chest: Effort normal and breath sounds normal. No respiratory distress. He has no wheezes. He has no rales. He exhibits no tenderness.  Abdominal: Soft. He exhibits no distension.  There is no tenderness. There is no guarding.  Musculoskeletal: Normal range of motion. He exhibits tenderness.  2+ DP and PT bilateral pulses. Good strength with dorsiflexion and plantar flexion. Decreased sensation to the RLE. Increased weakness to the RLE. There are 2 well-healed scars consistent with pevious fusion surgies over the lumbar spine. TTP over the lumber spine. No C or T tenderness.    Neurological: He is alert and oriented to person, place, and time.  Skin: Skin is warm and dry. No rash noted.  Psychiatric: His behavior is normal.  Nursing note and vitals reviewed.    ED Treatments / Results  DIAGNOSTIC STUDIES: Oxygen Saturation is 99% on RA, normal by my interpretation.   COORDINATION OF CARE: 3:31 PM-Discussed next steps with pt including a consult with neuro. Pt verbalized understanding and is agreeable with the plan.   Labs (all labs ordered are listed, but only abnormal results are displayed) Labs Reviewed - No data to display  EKG  EKG Interpretation None       Radiology No results found.  Procedures Procedures (including critical care time)  Medications Ordered in ED Medications  HYDROcodone-acetaminophen (NORCO/VICODIN) 5-325 MG per tablet 2 tablet (2  tablets Oral Given 06/03/17 1636)     Initial Impression / Assessment and Plan / ED Course  I have reviewed the triage vital signs and the nursing notes.  Pertinent labs & imaging results that were available during my care of the patient were reviewed by me and considered in my medical decision making (see chart for details).     Patient who is a poor historian presenting with acute on chronic low back pain secondary to a fall on June 25. The patient has an extensive neurological history, including his lumbar fusions and a left parietal VP shunt that was placed in December. The patient was initially evaluated immediately following the fall and diagnosed with a back contusion. 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   The patient reports worsening weakness and pain and urinary incontinence 1. Discussed the patient with Dr. Billy Fischer, attending physician. Pain control provided and physical exam was repeated. Good strength of the bilateral quadriceps, hamstrings, and gastrocnemius/soleus muscles with flexion, extension of the bilateral hips and knees. No weakness noted. Patellar DTRs are 2+, equal and symmetric. Considered deficits secondary to uncontrolled pain versus cord compression. Given history and physical exam, very low suspicion for cauda equina. The patient is established with neurosurgery for which I have recommended outpatient follow-up. Will provide the patient with a short course of pain medication. A 45-month prescription history query was performed using the [place] CSRS prior to discharge. Discussed the plan with the patient who is agreeable at this time. Answered all questions. Strict return precautions given. No acute distress. Vital signs stable. The patient is safe for discharge at this time.   Final Clinical Impressions(s) / ED Diagnoses   Final diagnoses:  Fall, subsequent encounter    New Prescriptions Discharge Medication List  as of 06/03/2017  5:48 PM     I personally performed the services described in this documentation, which was scribed in my presence. The recorded information has been reviewed and is accurate.     Joanne Gavel, PA-C 06/03/17 2011    Gareth Morgan, MD 06/06/17 2200

## 2017-06-05 ENCOUNTER — Encounter (HOSPITAL_COMMUNITY): Payer: Self-pay | Admitting: Emergency Medicine

## 2017-06-05 ENCOUNTER — Emergency Department (HOSPITAL_COMMUNITY)
Admission: EM | Admit: 2017-06-05 | Discharge: 2017-06-05 | Disposition: A | Discharge status: Home / Self Care | Payer: Medicare Other | Attending: Emergency Medicine | Admitting: Emergency Medicine

## 2017-06-05 ENCOUNTER — Emergency Department (HOSPITAL_COMMUNITY): Payer: Medicare Other

## 2017-06-05 DIAGNOSIS — E119 Type 2 diabetes mellitus without complications: Secondary | ICD-10-CM | POA: Insufficient documentation

## 2017-06-05 DIAGNOSIS — F319 Bipolar disorder, unspecified: Secondary | ICD-10-CM | POA: Diagnosis not present

## 2017-06-05 DIAGNOSIS — M545 Low back pain: Secondary | ICD-10-CM | POA: Diagnosis present

## 2017-06-05 DIAGNOSIS — M5431 Sciatica, right side: Secondary | ICD-10-CM | POA: Diagnosis not present

## 2017-06-05 DIAGNOSIS — F419 Anxiety disorder, unspecified: Secondary | ICD-10-CM | POA: Insufficient documentation

## 2017-06-05 DIAGNOSIS — Z7984 Long term (current) use of oral hypoglycemic drugs: Secondary | ICD-10-CM | POA: Insufficient documentation

## 2017-06-05 DIAGNOSIS — F209 Schizophrenia, unspecified: Secondary | ICD-10-CM | POA: Insufficient documentation

## 2017-06-05 DIAGNOSIS — Z79899 Other long term (current) drug therapy: Secondary | ICD-10-CM | POA: Diagnosis not present

## 2017-06-05 DIAGNOSIS — F17210 Nicotine dependence, cigarettes, uncomplicated: Secondary | ICD-10-CM | POA: Insufficient documentation

## 2017-06-05 MED ORDER — OXYCODONE-ACETAMINOPHEN 5-325 MG PO TABS
2.0000 | ORAL_TABLET | Freq: Once | ORAL | Status: AC
  Administered 2017-06-05: 2 via ORAL
  Filled 2017-06-05: qty 2

## 2017-06-05 MED ORDER — OXYCODONE-ACETAMINOPHEN 5-325 MG PO TABS
ORAL_TABLET | ORAL | Status: AC
  Filled 2017-06-05: qty 1

## 2017-06-05 MED ORDER — OXYCODONE-ACETAMINOPHEN 5-325 MG PO TABS
1.0000 | ORAL_TABLET | ORAL | Status: DC | PRN
  Administered 2017-06-05: 1 via ORAL

## 2017-06-05 MED ORDER — OXYCODONE-ACETAMINOPHEN 5-325 MG PO TABS: 1.0000 | ORAL_TABLET | Freq: Four times a day (QID) | ORAL | 0 refills | Status: DC | PRN

## 2017-06-05 MED ORDER — CYCLOBENZAPRINE HCL 10 MG PO TABS: 10.0000 mg | ORAL_TABLET | Freq: Three times a day (TID) | ORAL | 0 refills | Status: DC | PRN

## 2017-06-05 NOTE — ED Notes (Signed)
Pt requested pain medication and agreed to take percocet.

## 2017-06-05 NOTE — Discharge Instructions (Signed)
You need to follow-up with your surgeon, as soon as possible.  I would advise using a cane or a walker until that time.  If you have any worsening in your condition.  Return here as needed

## 2017-06-05 NOTE — ED Triage Notes (Signed)
Pt c/o mid-back  Burning sensation that radiates to low back and down B/L legs x 2 weeks. Pt seen for same and given prescription for percocet but reports ineffective.

## 2017-06-05 NOTE — ED Notes (Signed)
Per Gerald Stabs, [place], give pt pain medication then ambulate prior to d/c.

## 2017-06-05 NOTE — ED Notes (Signed)
Pt ambulated independently w/ a steady gait.

## 2017-06-06 NOTE — ED Provider Notes (Signed)
[facility] DEPT Provider Note   CSN: [id number] Arrival date & time: 06/05/17  1548     History   Chief Complaint Chief Complaint  Patient presents with  . Back Pain    HPI [name] is a 61 y.o. male.  HPI Patient presents to the emergency department with lower back pain.  The patient states that he felt like his pain radiates into his right leg.  He states that he has had chronic back issues, but tonight felt like his leg gave out.  The patient states that he was able to ambulate following this incident.  Patient states he is wanting to be reevaluated and states he was unable to follow up with his neurologist.  The patient states that he was seen here 2 days ago for the back pain.  He should states that nothing seems make the condition better, movement and palpation make the pain worse.  He states that the home medicines did seem to help with his discomfortThe patient denies chest pain, shortness of breath, headache,blurred vision, neck pain, fever, cough, weakness, numbness, dizziness, anorexia, edema, abdominal pain, nausea, vomiting, diarrhea, rash, dysuria, hematemesis, bloody stool, near syncope, or syncope.  She denies any incontinence of bowel or bladder at this time Past Medical History:  Diagnosis Date  . Anxiety   . Bipolar disorder ([place])   . Chronic back pain   . Depression   . Diabetes mellitus without complication ([place])   . Diabetic neuropathy ([place])   . DVT (deep venous thrombosis) ([place])   . Enlarged prostate   . Headache   . Hepatitis    Hepatitis C - has been treated with Harvoni (cleared in July, 2017)  . History of colon polyps   . Mental disorder    bipolar  . Peripheral vascular disease ([place])   . Pneumonia    hx of-about 46yrs ago  . Schizophrenia [facility])     Patient Active Problem List   Diagnosis Date Noted  . NPH (normal pressure hydrocephalus) 10/30/2016  . Normal pressure hydrocephalus 09/01/2016  . Bilateral sciatica 09/01/2016  . Altered  mental status 07/01/2016  . Acute encephalopathy 07/01/2016  . Hydrocephalus in adult 07/01/2016  . Shaking spells 06/01/2016  . Gait disturbance 04/10/2015  . Numbness 04/10/2015  . Lumbar radicular pain 04/10/2015  . Lumbar spondylosis 04/10/2015  . Bipolar I disorder with depression, severe ([place]) 06/30/2014  . Suicidal ideations 06/30/2014  . Ulcer of lower limb, unspecified 02/20/2014  . CELLULITIS AND ABSCESS OF LEG EXCEPT FOOT 11/08/2007    Past Surgical History:  Procedure Laterality Date  . APPENDECTOMY     early 94's  . BACK SURGERY  05/2011   x 5  . blood clot in groin  early 80's  . CARPAL TUNNEL RELEASE     left  . CERVICAL SPINE SURGERY  2009  . COLONOSCOPY    . HERNIA REPAIR     early 2000's  . SHOULDER SURGERY  70's   right  . VENTRICULOPERITONEAL SHUNT N/A 10/30/2016   Procedure: Right Ventricular Peritoneal Shunt;  Surgeon: Kary Kos, MD;  Location: [facility];  Service: Neurosurgery;  Laterality: N/A;       Home Medications    Prior to Admission medications   Medication Sig Start Date End Date Taking? Authorizing Provider  cholecalciferol (VITAMIN D) 1000 units tablet Take 1,000 Units by mouth daily.    [provider]  cyclobenzaprine (FLEXERIL) 10 MG tablet Take 1 tablet (10 mg total) by mouth  3 (three) times daily as needed for muscle spasms. 06/05/17   Teal Bontrager, Harrell Gave, PA-C  HYDROcodone-acetaminophen (NORCO/VICODIN) 5-325 MG tablet Take 1 tablet by mouth every 6 (six) hours as needed. 06/03/17   McDonald, Mia A, PA-C  ibuprofen (ADVIL,MOTRIN) 600 MG tablet Take 1 tablet (600 mg total) by mouth every 6 (six) hours as needed. 05/24/17   Drenda Freeze, MD  metFORMIN (GLUCOPHAGE) 500 MG tablet Take 500 mg by mouth 2 (two) times daily with a meal.    [provider]  oxyCODONE-acetaminophen (PERCOCET/ROXICET) 5-325 MG tablet Take 1 tablet by mouth every 6 (six) hours as needed for severe pain. 06/05/17   Amine Adelson, Harrell Gave, PA-C    paliperidone (INVEGA) 3 MG 24 hr tablet Take 3 mg by mouth at bedtime. 04/22/17   [provider]  QUEtiapine (SEROQUEL) 400 MG tablet Take 800 mg by mouth at bedtime. 05/14/17   [provider]  risperiDONE (RISPERDAL) 1 MG tablet Take 1 tablet (1 mg total) by mouth 2 (two) times daily. Patient not taking: Reported on 05/24/2017 07/05/16   Elgergawy, Silver Huguenin, MD  sertraline (ZOLOFT) 100 MG tablet Take 100 mg by mouth at bedtime.    [provider]  sitaGLIPtin (JANUVIA) 100 MG tablet Take 100 mg by mouth daily with breakfast.    [provider]  tamsulosin (FLOMAX) 0.4 MG CAPS capsule Take 0.4 mg by mouth daily after supper.     [provider]  traZODone (DESYREL) 150 MG tablet Take 150 mg by mouth at bedtime. 04/22/17   [provider]    Family History Family History  Problem Relation Age of Onset  . Heart attack Father   . Anesthesia problems Neg Hx   . Hypotension Neg Hx   . Malignant hyperthermia Neg Hx   . Pseudochol deficiency Neg Hx     Social History Social History  Substance Use Topics  . Smoking status: Current Every Day Smoker    Packs/day: 1.00    Years: 40.00    Types: Cigarettes  . Smokeless tobacco: Never Used     Comment: pt states that he is ready to quit smoking but thinks he will need patches advised him to speak with his PCP which he is seaing this Thursday 3/26  . Alcohol use No     Allergies   Penicillins   Review of Systems Review of Systems All other systems negative except as documented in the HPI. All pertinent positives and negatives as reviewed in the HPI.  Physical Exam Updated Vital Signs BP (!) 145/79 (BP Location: Right Arm)   Pulse 76   Temp 98 F (36.7 C) (Oral)   Resp 18   Ht 6' (1.829 m)   Wt 86.2 kg (190 lb)   SpO2 100%   BMI 25.77 kg/m   Physical Exam  Constitutional: He is oriented to person, place, and time. He appears well-developed and well-nourished. No distress.   HENT:  Head: Normocephalic and atraumatic.  Mouth/Throat: Oropharynx is clear and moist.  Eyes: Pupils are equal, round, and reactive to light.  Neck: Normal range of motion. Neck supple.  Cardiovascular: Normal rate, regular rhythm and normal heart sounds.  Exam reveals no gallop and no friction rub.   No murmur heard. Pulmonary/Chest: Effort normal and breath sounds normal. No respiratory distress. He has no wheezes.  Musculoskeletal:       Lumbar back: He exhibits tenderness and pain. He exhibits normal range of motion, no bony tenderness, no swelling and  no deformity.       Back:  Neurological: He is alert and oriented to person, place, and time. He has normal strength. He exhibits normal muscle tone. Coordination and gait normal. GCS eye subscore is 4. GCS verbal subscore is 5. GCS motor subscore is 6.  Reflex Scores:      Patellar reflexes are 2+ on the right side and 2+ on the left side.      Achilles reflexes are 2+ on the right side and 2+ on the left side. Skin: Skin is warm and dry. Capillary refill takes less than 2 seconds. No rash noted. No erythema.  Psychiatric: He has a normal mood and affect. His behavior is normal.  Nursing note and vitals reviewed.    ED Treatments / Results  Labs (all labs ordered are listed, but only abnormal results are displayed) Labs Reviewed - No data to display  EKG  EKG Interpretation None       Radiology Dg Lumbar Spine Complete  Result Date: 06/05/2017 CLINICAL DATA:  Back pain with radiation to right leg. Fall 2 weeks ago. Chronic back pain. EXAM: LUMBAR SPINE - COMPLETE 4+ VIEW COMPARISON:  CT of 05/24/2017 FINDINGS: Five lumbar type vertebral bodies. Sacroiliac joints are symmetric. Age advanced atherosclerosis. VP shunt catheter. Status post L2-L5 trans pedicle screw fixation. No acute hardware complication. Facet arthropathy involves L5-S1. Abdominal aortic atherosclerosis. IMPRESSION: Postsurgical changes of L2-5 trans pedicle  screw fixation, without acute hardware complication. Spondylosis. Aortic Atherosclerosis (ICD10-I70.0).  This is age advanced. Electronically Signed   By: Abigail Miyamoto M.D.   On: 06/05/2017 19:53    Procedures Procedures (including critical care time)  Medications Ordered in ED Medications  oxyCODONE-acetaminophen (PERCOCET/ROXICET) 5-325 MG per tablet 1 tablet (1 tablet Oral Given 06/05/17 1556)  oxyCODONE-acetaminophen (PERCOCET/ROXICET) 5-325 MG per tablet (not administered)  oxyCODONE-acetaminophen (PERCOCET/ROXICET) 5-325 MG per tablet 2 tablet (2 tablets Oral Given 06/05/17 2023)     Initial Impression / Assessment and Plan / ED Course  I have reviewed the triage vital signs and the nursing notes.  Pertinent labs & imaging results that were available during my care of the patient were reviewed by me and considered in my medical decision making (see chart for details).     At this point, I feel that the patient did not need follow-up with neurosurgery not neurology and he given follow-up with his neurosurgeon.  Told to return here as needed.  The patient was able to ambulate without difficulty.  Patient is advised to return here as needed.  Told to use ice and heat on his lower back.  Patient agrees the plan and all questions were answered.  He says normal strength and range of motion is lower 70s, along with sensation and reflexes Final Clinical Impressions(s) / ED Diagnoses   Final diagnoses:  Sciatica of right side    New Prescriptions Discharge Medication List as of 06/05/2017  8:13 PM       Dalia Heading, PA-C 06/06/17 0047    Tanna Furry, MD 06/16/17 [phone]

## 2017-06-11 ENCOUNTER — Emergency Department (HOSPITAL_COMMUNITY)
Admission: EM | Admit: 2017-06-11 | Discharge: 2017-06-11 | Disposition: A | Discharge status: Home / Self Care | Payer: Medicare Other | Attending: Emergency Medicine | Admitting: Emergency Medicine

## 2017-06-11 ENCOUNTER — Encounter (HOSPITAL_COMMUNITY): Payer: Self-pay | Admitting: Emergency Medicine

## 2017-06-11 DIAGNOSIS — I739 Peripheral vascular disease, unspecified: Secondary | ICD-10-CM | POA: Diagnosis not present

## 2017-06-11 DIAGNOSIS — E119 Type 2 diabetes mellitus without complications: Secondary | ICD-10-CM | POA: Insufficient documentation

## 2017-06-11 DIAGNOSIS — G8929 Other chronic pain: Secondary | ICD-10-CM | POA: Diagnosis not present

## 2017-06-11 DIAGNOSIS — Z86718 Personal history of other venous thrombosis and embolism: Secondary | ICD-10-CM | POA: Diagnosis not present

## 2017-06-11 DIAGNOSIS — M5440 Lumbago with sciatica, unspecified side: Secondary | ICD-10-CM

## 2017-06-11 DIAGNOSIS — M545 Low back pain: Secondary | ICD-10-CM | POA: Diagnosis present

## 2017-06-11 DIAGNOSIS — M5441 Lumbago with sciatica, right side: Secondary | ICD-10-CM | POA: Diagnosis not present

## 2017-06-11 DIAGNOSIS — Z7984 Long term (current) use of oral hypoglycemic drugs: Secondary | ICD-10-CM | POA: Insufficient documentation

## 2017-06-11 DIAGNOSIS — F17210 Nicotine dependence, cigarettes, uncomplicated: Secondary | ICD-10-CM | POA: Insufficient documentation

## 2017-06-11 DIAGNOSIS — Z79899 Other long term (current) drug therapy: Secondary | ICD-10-CM | POA: Insufficient documentation

## 2017-06-11 MED ORDER — OXYCODONE-ACETAMINOPHEN 5-325 MG PO TABS
1.0000 | ORAL_TABLET | Freq: Once | ORAL | Status: AC
  Administered 2017-06-11: 1 via ORAL
  Filled 2017-06-11: qty 1

## 2017-06-11 MED ORDER — DIAZEPAM 5 MG PO TABS
5.0000 mg | ORAL_TABLET | Freq: Once | ORAL | Status: AC
  Administered 2017-06-11: 5 mg via ORAL
  Filled 2017-06-11: qty 1

## 2017-06-11 MED ORDER — DIAZEPAM 2 MG PO TABS: 2.0000 mg | ORAL_TABLET | Freq: Four times a day (QID) | ORAL | 0 refills | Status: DC | PRN

## 2017-06-11 MED ORDER — TRAMADOL HCL 50 MG PO TABS: 50.0000 mg | ORAL_TABLET | Freq: Four times a day (QID) | ORAL | 0 refills | Status: DC | PRN

## 2017-06-11 MED ORDER — PREDNISONE 50 MG PO TABS: 50.0000 mg | ORAL_TABLET | Freq: Every day | ORAL | 0 refills | Status: DC

## 2017-06-11 MED ORDER — PREDNISONE 20 MG PO TABS
60.0000 mg | ORAL_TABLET | Freq: Once | ORAL | Status: AC
  Administered 2017-06-11: 60 mg via ORAL
  Filled 2017-06-11: qty 3

## 2017-06-11 NOTE — ED Provider Notes (Signed)
[facility] DEPT Provider Note   CSN: [id number] Arrival date & time: 06/11/17  1632     History   Chief Complaint Chief Complaint  Patient presents with  . Back Pain  . Leg Pain    HPI [name] is a 61 y.o. male.  61 year old male presents with worsening chronic lower back pain. Seen here 6 days ago for similar symptoms and had negative plain films. Says the pain is when he has prior surgery and has not been associated with bowel or bladder dysfunction. Does have intermittent spasms with some right lower extremity radicular pain. No fever or chills. No urinary symptoms. Pain characterized as sharp and worse with standing and better with remaining still. Has a follow-up appointment scheduled with a spine surgeon in 2 weeks. Is not currently taking any medications at this time.      Past Medical History:  Diagnosis Date  . Anxiety   . Bipolar disorder ([place])   . Chronic back pain   . Depression   . Diabetes mellitus without complication ([place])   . Diabetic neuropathy ([place])   . DVT (deep venous thrombosis) ([place])   . Enlarged prostate   . Headache   . Hepatitis    Hepatitis C - has been treated with Harvoni (cleared in July, 2017)  . History of colon polyps   . Mental disorder    bipolar  . Peripheral vascular disease ([place])   . Pneumonia    hx of-about 14yrs ago  . Schizophrenia [facility])     Patient Active Problem List   Diagnosis Date Noted  . NPH (normal pressure hydrocephalus) 10/30/2016  . Normal pressure hydrocephalus 09/01/2016  . Bilateral sciatica 09/01/2016  . Altered mental status 07/01/2016  . Acute encephalopathy 07/01/2016  . Hydrocephalus in adult 07/01/2016  . Shaking spells 06/01/2016  . Gait disturbance 04/10/2015  . Numbness 04/10/2015  . Lumbar radicular pain 04/10/2015  . Lumbar spondylosis 04/10/2015  . Bipolar I disorder with depression, severe ([place]) 06/30/2014  . Suicidal ideations 06/30/2014  . Ulcer of lower limb, unspecified  02/20/2014  . CELLULITIS AND ABSCESS OF LEG EXCEPT FOOT 11/08/2007    Past Surgical History:  Procedure Laterality Date  . APPENDECTOMY     early 62's  . BACK SURGERY  05/2011   x 5  . blood clot in groin  early 80's  . CARPAL TUNNEL RELEASE     left  . CERVICAL SPINE SURGERY  2009  . COLONOSCOPY    . HERNIA REPAIR     early 2000's  . SHOULDER SURGERY  70's   right  . VENTRICULOPERITONEAL SHUNT N/A 10/30/2016   Procedure: Right Ventricular Peritoneal Shunt;  Surgeon: Kary Kos, MD;  Location: [facility];  Service: Neurosurgery;  Laterality: N/A;       Home Medications    Prior to Admission medications   Medication Sig Start Date End Date Taking? Authorizing Provider  cholecalciferol (VITAMIN D) 1000 units tablet Take 1,000 Units by mouth daily.   Yes [provider]  cyclobenzaprine (FLEXERIL) 10 MG tablet Take 1 tablet (10 mg total) by mouth 3 (three) times daily as needed for muscle spasms. 06/05/17  Yes Lawyer, Harrell Gave, PA-C  gabapentin (NEURONTIN) 300 MG capsule Take 300 mg by mouth at bedtime.  06/11/17  Yes [provider]  metFORMIN (GLUCOPHAGE) 500 MG tablet Take 500 mg by mouth daily with breakfast.    Yes [provider]  paliperidone (INVEGA) 3 MG 24 hr tablet Take 3 mg  by mouth at bedtime. 04/22/17  Yes [provider]  QUEtiapine (SEROQUEL) 400 MG tablet Take 800 mg by mouth at bedtime. 05/14/17  Yes [provider]  sertraline (ZOLOFT) 100 MG tablet Take 100 mg by mouth at bedtime.   Yes [provider]  sitaGLIPtin (JANUVIA) 100 MG tablet Take 100 mg by mouth daily with breakfast.   Yes [provider]  tamsulosin (FLOMAX) 0.4 MG CAPS capsule Take 0.4 mg by mouth daily after supper.    Yes [provider]  traZODone (DESYREL) 150 MG tablet Take 150 mg by mouth at bedtime. 04/22/17  Yes [provider]  HYDROcodone-acetaminophen (NORCO/VICODIN) 5-325 MG tablet Take 1 tablet by mouth every 6  (six) hours as needed. Patient taking differently: Take 1 tablet by mouth every 6 (six) hours as needed for moderate pain or severe pain.  06/03/17   McDonald, Mia A, PA-C  ibuprofen (ADVIL,MOTRIN) 600 MG tablet Take 1 tablet (600 mg total) by mouth every 6 (six) hours as needed. Patient not taking: Reported on 06/11/2017 05/24/17   Drenda Freeze, MD  oxyCODONE-acetaminophen (PERCOCET/ROXICET) 5-325 MG tablet Take 1 tablet by mouth every 6 (six) hours as needed for severe pain. Patient not taking: Reported on 06/11/2017 06/05/17   Dalia Heading, PA-C  risperiDONE (RISPERDAL) 1 MG tablet Take 1 tablet (1 mg total) by mouth 2 (two) times daily. Patient not taking: Reported on 05/24/2017 07/05/16   Elgergawy, Silver Huguenin, MD    Family History Family History  Problem Relation Age of Onset  . Heart attack Father   . Anesthesia problems Neg Hx   . Hypotension Neg Hx   . Malignant hyperthermia Neg Hx   . Pseudochol deficiency Neg Hx     Social History Social History  Substance Use Topics  . Smoking status: Current Every Day Smoker    Packs/day: 1.00    Years: 40.00    Types: Cigarettes  . Smokeless tobacco: Never Used     Comment: pt states that he is ready to quit smoking but thinks he will need patches advised him to speak with his PCP which he is seaing this Thursday 3/26  . Alcohol use No     Allergies   Penicillins   Review of Systems Review of Systems  All other systems reviewed and are negative.    Physical Exam Updated Vital Signs BP 124/68 (BP Location: Right Arm)   Pulse 97   Temp 98 F (36.7 C) (Oral)   Resp 18   Ht 1.829 m (6')   Wt 86.2 kg (190 lb)   SpO2 97%   BMI 25.77 kg/m   Physical Exam  Constitutional: He is oriented to person, place, and time. He appears well-developed and well-nourished.  Non-toxic appearance. No distress.  HENT:  Head: Normocephalic and atraumatic.  Eyes: Pupils are equal, round, and reactive to light. Conjunctivae, EOM and  lids are normal.  Neck: Normal range of motion. Neck supple. No tracheal deviation present. No thyroid mass present.  Cardiovascular: Normal rate, regular rhythm and normal heart sounds.  Exam reveals no gallop.   No murmur heard. Pulmonary/Chest: Effort normal and breath sounds normal. No stridor. No respiratory distress. He has no decreased breath sounds. He has no wheezes. He has no rhonchi. He has no rales.  Abdominal: Soft. Normal appearance and bowel sounds are normal. He exhibits no distension. There is no tenderness. There is no rebound and no CVA tenderness.  Musculoskeletal: Normal range of motion. He exhibits  no edema or tenderness.       Back:  Neurological: He is alert and oriented to person, place, and time. He has normal strength. No cranial nerve deficit or sensory deficit. GCS eye subscore is 4. GCS verbal subscore is 5. GCS motor subscore is 6.  Reflex Scores:      Patellar reflexes are 1+ on the right side and 1+ on the left side. Skin: Skin is warm and dry. No abrasion and no rash noted.  Psychiatric: He has a normal mood and affect. His speech is normal and behavior is normal.  Nursing note and vitals reviewed.    ED Treatments / Results  Labs (all labs ordered are listed, but only abnormal results are displayed) Labs Reviewed  URINALYSIS, ROUTINE W REFLEX MICROSCOPIC    EKG  EKG Interpretation None       Radiology No results found.  Procedures Procedures (including critical care time)  Medications Ordered in ED Medications  predniSONE (DELTASONE) tablet 60 mg (not administered)  oxyCODONE-acetaminophen (PERCOCET/ROXICET) 5-325 MG per tablet 1 tablet (not administered)  diazepam (VALIUM) tablet 5 mg (not administered)     Initial Impression / Assessment and Plan / ED Course  I have reviewed the triage vital signs and the nursing notes.  Pertinent labs & imaging results that were available during my care of the patient were reviewed by me and  considered in my medical decision making (see chart for details).     Patient has no neurological findings on his lower extremity. He has no foot drop. Sensation is intact. Given prednisone here and will give short prescription for same as well as prescription for Ultram and Valium  Final Clinical Impressions(s) / ED Diagnoses   Final diagnoses:  None    New Prescriptions New Prescriptions   No medications on file     Lacretia Leigh, MD 06/11/17 2059

## 2017-06-11 NOTE — ED Triage Notes (Addendum)
Per [facility] pt from home c/o back and bilateral leg pain for past month. Pt c/o tingling sensation in right leg. Pt is non ambulatory since last night and states pain is worse then his typical chronic back pain. Pt states when back spasms he loses control of his bladder. C/o headache. BP: 130/70, CBG 86. Rates pain 10/10

## 2017-06-11 NOTE — ED Notes (Signed)
Discharge instructions reviewed with patient. Patient verbalizes understanding. VSS.   

## 2017-06-21 ENCOUNTER — Ambulatory Visit (INDEPENDENT_AMBULATORY_CARE_PROVIDER_SITE_OTHER): Payer: Medicare Other | Admitting: Orthopaedic Surgery

## 2017-06-21 ENCOUNTER — Telehealth (INDEPENDENT_AMBULATORY_CARE_PROVIDER_SITE_OTHER): Payer: Self-pay | Admitting: *Deleted

## 2017-06-21 DIAGNOSIS — M47816 Spondylosis without myelopathy or radiculopathy, lumbar region: Secondary | ICD-10-CM | POA: Diagnosis not present

## 2017-06-21 MED ORDER — HYDROCODONE-ACETAMINOPHEN 5-325 MG PO TABS: 1.0000 | ORAL_TABLET | Freq: Two times a day (BID) | ORAL | 0 refills | Status: DC | PRN

## 2017-06-21 NOTE — Telephone Encounter (Signed)
Pt saw Dr. Erlinda Hong today 7/23 and he wants him to see [facility] ASAP, he gave him a RX that will last about two weeks family member said. Gave them his first available and told them to call back to see if there are any cancellations.

## 2017-06-21 NOTE — Progress Notes (Signed)
Office Visit Note   Patient: [name]           Date of Birth: 08-15-1956           MRN: [id number] Visit Date: 06/21/2017              Requested by: Nolene Ebbs, MD [address] [place], [place] [address] PCP: Nolene Ebbs, MD   Assessment & Plan: Visit Diagnoses:  1. Lumbar spondylosis     Plan: I do not appreciate any focal or acute findings on x-ray or on exam. Norco was prescribed today. Follow-up with Dr. Hart Carwin as soon as possible.  Follow-Up Instructions: Return if symptoms worsen or fail to improve.   Orders:  No orders of the defined types were placed in this encounter.  Meds ordered this encounter  Medications  . HYDROcodone-acetaminophen (NORCO) 5-325 MG tablet    Sig: Take 1-2 tablets by mouth 2 (two) times daily as needed.    Dispense:  30 tablet    Refill:  0      Procedures: No procedures performed   Clinical Data: No additional findings.   Subjective: No chief complaint on file.   Patient comes in today for back pain with radiation down his right leg. He has taken prednisone oxycodone and diazepam without significant relief. He has fallen twice and into the ER twice. He continues to have tingling burning and numbness. He has difficulty sleeping. Denies any constitutional symptoms or bowel or bladder dysfunction.    Review of Systems  Constitutional: Negative.   All other systems reviewed and are negative.    Objective: Vital Signs: There were no vitals taken for this visit.  Physical Exam  Constitutional: He is oriented to person, place, and time. He appears well-developed and well-nourished.  Pulmonary/Chest: Effort normal.  Abdominal: Soft.  Neurological: He is alert and oriented to person, place, and time.  Skin: Skin is warm.  Psychiatric: He has a normal mood and affect. His behavior is normal. Judgment and thought content normal.  Nursing note and vitals reviewed.   Ortho Exam Lumbar spine exam shows a fully  healed surgical scar. He has no focal findings. No sciatic tension signs. Specialty Comments:  No specialty comments available.  Imaging: No results found.   PMFS History: Patient Active Problem List   Diagnosis Date Noted  . NPH (normal pressure hydrocephalus) 10/30/2016  . Normal pressure hydrocephalus 09/01/2016  . Bilateral sciatica 09/01/2016  . Altered mental status 07/01/2016  . Acute encephalopathy 07/01/2016  . Hydrocephalus in adult 07/01/2016  . Shaking spells 06/01/2016  . Gait disturbance 04/10/2015  . Numbness 04/10/2015  . Lumbar radicular pain 04/10/2015  . Lumbar spondylosis 04/10/2015  . Bipolar I disorder with depression, severe ([place]) 06/30/2014  . Suicidal ideations 06/30/2014  . Ulcer of lower limb, unspecified 02/20/2014  . CELLULITIS AND ABSCESS OF LEG EXCEPT FOOT 11/08/2007   Past Medical History:  Diagnosis Date  . Anxiety   . Bipolar disorder ([place])   . Chronic back pain   . Depression   . Diabetes mellitus without complication ([place])   . Diabetic neuropathy ([place])   . DVT (deep venous thrombosis) ([place])   . Enlarged prostate   . Headache   . Hepatitis    Hepatitis C - has been treated with Harvoni (cleared in July, 2017)  . History of colon polyps   . Mental disorder    bipolar  . Peripheral vascular disease ([place])   . Pneumonia  hx of-about 67yrs ago  . Schizophrenia ([place])     Family History  Problem Relation Age of Onset  . Heart attack Father   . Anesthesia problems Neg Hx   . Hypotension Neg Hx   . Malignant hyperthermia Neg Hx   . Pseudochol deficiency Neg Hx     Past Surgical History:  Procedure Laterality Date  . APPENDECTOMY     early 24's  . BACK SURGERY  05/2011   x 5  . blood clot in groin  early 80's  . CARPAL TUNNEL RELEASE     left  . CERVICAL SPINE SURGERY  2009  . COLONOSCOPY    . HERNIA REPAIR     early 2000's  . SHOULDER SURGERY  70's   right  . VENTRICULOPERITONEAL SHUNT N/A 10/30/2016   Procedure: Right  Ventricular Peritoneal Shunt;  Surgeon: Kary Kos, MD;  Location: [facility];  Service: Neurosurgery;  Laterality: N/A;   Social History   Occupational History  . Not on file.   Social History Main Topics  . Smoking status: Current Every Day Smoker    Packs/day: 1.00    Years: 40.00    Types: Cigarettes  . Smokeless tobacco: Never Used     Comment: pt states that he is ready to quit smoking but thinks he will need patches advised him to speak with his PCP which he is seaing this Thursday 3/26  . Alcohol use No  . Drug use: No  . Sexual activity: Yes

## 2017-06-21 NOTE — Telephone Encounter (Signed)
I have put pts name on cancellation list

## 2017-06-24 ENCOUNTER — Encounter (HOSPITAL_COMMUNITY): Payer: Self-pay

## 2017-06-24 ENCOUNTER — Encounter (INDEPENDENT_AMBULATORY_CARE_PROVIDER_SITE_OTHER): Payer: Self-pay | Admitting: Specialist

## 2017-06-24 ENCOUNTER — Inpatient Hospital Stay (HOSPITAL_COMMUNITY)
Admission: EM | Admit: 2017-06-24 | Discharge: 2017-06-30 | DRG: 460 | Disposition: A | Discharge status: Skilled Nursing Facility | Payer: Medicare Other | Attending: Specialist | Admitting: Specialist

## 2017-06-24 ENCOUNTER — Ambulatory Visit (INDEPENDENT_AMBULATORY_CARE_PROVIDER_SITE_OTHER): Payer: Medicare Other | Admitting: Specialist

## 2017-06-24 VITALS — BP 139/82 | HR 110 | Ht 72.0 in | Wt 190.0 lb

## 2017-06-24 DIAGNOSIS — M5416 Radiculopathy, lumbar region: Secondary | ICD-10-CM | POA: Diagnosis not present

## 2017-06-24 DIAGNOSIS — L7634 Postprocedural seroma of skin and subcutaneous tissue following other procedure: Secondary | ICD-10-CM | POA: Diagnosis not present

## 2017-06-24 DIAGNOSIS — G959 Disease of spinal cord, unspecified: Secondary | ICD-10-CM | POA: Diagnosis not present

## 2017-06-24 DIAGNOSIS — M4807 Spinal stenosis, lumbosacral region: Secondary | ICD-10-CM | POA: Diagnosis present

## 2017-06-24 DIAGNOSIS — Z981 Arthrodesis status: Secondary | ICD-10-CM

## 2017-06-24 DIAGNOSIS — R140 Abdominal distension (gaseous): Secondary | ICD-10-CM

## 2017-06-24 DIAGNOSIS — M545 Low back pain, unspecified: Secondary | ICD-10-CM | POA: Diagnosis present

## 2017-06-24 DIAGNOSIS — Z982 Presence of cerebrospinal fluid drainage device: Secondary | ICD-10-CM

## 2017-06-24 DIAGNOSIS — G8929 Other chronic pain: Secondary | ICD-10-CM | POA: Diagnosis present

## 2017-06-24 DIAGNOSIS — M4316 Spondylolisthesis, lumbar region: Secondary | ICD-10-CM | POA: Diagnosis present

## 2017-06-24 DIAGNOSIS — Z419 Encounter for procedure for purposes other than remedying health state, unspecified: Secondary | ICD-10-CM

## 2017-06-24 DIAGNOSIS — Z7952 Long term (current) use of systemic steroids: Secondary | ICD-10-CM

## 2017-06-24 DIAGNOSIS — G834 Cauda equina syndrome: Secondary | ICD-10-CM | POA: Diagnosis present

## 2017-06-24 DIAGNOSIS — F319 Bipolar disorder, unspecified: Secondary | ICD-10-CM | POA: Diagnosis present

## 2017-06-24 DIAGNOSIS — N3941 Urge incontinence: Secondary | ICD-10-CM | POA: Diagnosis present

## 2017-06-24 DIAGNOSIS — F209 Schizophrenia, unspecified: Secondary | ICD-10-CM | POA: Diagnosis present

## 2017-06-24 DIAGNOSIS — R32 Unspecified urinary incontinence: Secondary | ICD-10-CM | POA: Diagnosis not present

## 2017-06-24 DIAGNOSIS — Z9181 History of falling: Secondary | ICD-10-CM

## 2017-06-24 DIAGNOSIS — Z7984 Long term (current) use of oral hypoglycemic drugs: Secondary | ICD-10-CM

## 2017-06-24 DIAGNOSIS — E1140 Type 2 diabetes mellitus with diabetic neuropathy, unspecified: Secondary | ICD-10-CM | POA: Diagnosis present

## 2017-06-24 DIAGNOSIS — F17210 Nicotine dependence, cigarettes, uncomplicated: Secondary | ICD-10-CM | POA: Diagnosis present

## 2017-06-24 DIAGNOSIS — E119 Type 2 diabetes mellitus without complications: Secondary | ICD-10-CM

## 2017-06-24 DIAGNOSIS — M4716 Other spondylosis with myelopathy, lumbar region: Principal | ICD-10-CM | POA: Diagnosis present

## 2017-06-24 DIAGNOSIS — D500 Iron deficiency anemia secondary to blood loss (chronic): Secondary | ICD-10-CM | POA: Diagnosis not present

## 2017-06-24 DIAGNOSIS — M4317 Spondylolisthesis, lumbosacral region: Secondary | ICD-10-CM | POA: Diagnosis present

## 2017-06-24 DIAGNOSIS — M48061 Spinal stenosis, lumbar region without neurogenic claudication: Secondary | ICD-10-CM

## 2017-06-24 DIAGNOSIS — Y838 Other surgical procedures as the cause of abnormal reaction of the patient, or of later complication, without mention of misadventure at the time of the procedure: Secondary | ICD-10-CM | POA: Diagnosis not present

## 2017-06-24 DIAGNOSIS — E1151 Type 2 diabetes mellitus with diabetic peripheral angiopathy without gangrene: Secondary | ICD-10-CM | POA: Diagnosis present

## 2017-06-24 DIAGNOSIS — Y92230 Patient room in hospital as the place of occurrence of the external cause: Secondary | ICD-10-CM | POA: Diagnosis not present

## 2017-06-24 DIAGNOSIS — Z880 Allergy status to penicillin: Secondary | ICD-10-CM

## 2017-06-24 LAB — CBC WITH DIFFERENTIAL/PLATELET
BASOS ABS: 0.1 10*3/uL (ref 0.0–0.1)
Basophils Relative: 1 %
Eosinophils Absolute: 0.3 10*3/uL (ref 0.0–0.7)
Eosinophils Relative: 3 %
HEMATOCRIT: 38.4 % — AB (ref 39.0–52.0)
Hemoglobin: 13.2 g/dL (ref 13.0–17.0)
LYMPHS PCT: 41 %
Lymphs Abs: 3.6 10*3/uL (ref 0.7–4.0)
MCH: 31.4 pg (ref 26.0–34.0)
MCHC: 34.4 g/dL (ref 30.0–36.0)
MCV: 91.2 fL (ref 78.0–100.0)
MONO ABS: 0.7 10*3/uL (ref 0.1–1.0)
Monocytes Relative: 9 %
NEUTROS ABS: 4 10*3/uL (ref 1.7–7.7)
Neutrophils Relative %: 46 %
PLATELETS: 244 10*3/uL (ref 150–400)
RBC: 4.21 MIL/uL — AB (ref 4.22–5.81)
RDW: 11.4 % — AB (ref 11.5–15.5)
WBC: 8.6 10*3/uL (ref 4.0–10.5)

## 2017-06-24 LAB — COMPREHENSIVE METABOLIC PANEL
ALT: 12 U/L — ABNORMAL LOW (ref 17–63)
ANION GAP: 9 (ref 5–15)
AST: 18 U/L (ref 15–41)
Albumin: 3.9 g/dL (ref 3.5–5.0)
Alkaline Phosphatase: 89 U/L (ref 38–126)
BILIRUBIN TOTAL: 0.6 mg/dL (ref 0.3–1.2)
BUN: 8 mg/dL (ref 6–20)
CO2: 29 mmol/L (ref 22–32)
Calcium: 9.5 mg/dL (ref 8.9–10.3)
Chloride: 103 mmol/L (ref 101–111)
Creatinine, Ser: 0.84 mg/dL (ref 0.61–1.24)
Glucose, Bld: 119 mg/dL — ABNORMAL HIGH (ref 65–99)
POTASSIUM: 3.8 mmol/L (ref 3.5–5.1)
Sodium: 141 mmol/L (ref 135–145)
TOTAL PROTEIN: 7.7 g/dL (ref 6.5–8.1)

## 2017-06-24 MED ORDER — KETOROLAC TROMETHAMINE 15 MG/ML IJ SOLN
30.0000 mg | Freq: Four times a day (QID) | INTRAMUSCULAR | Status: DC
  Administered 2017-06-24 – 2017-06-25 (×5): 30 mg via INTRAVENOUS
  Filled 2017-06-24 (×5): qty 2

## 2017-06-24 MED ORDER — METHOCARBAMOL 1000 MG/10ML IJ SOLN
500.0000 mg | Freq: Four times a day (QID) | INTRAVENOUS | Status: DC | PRN
  Filled 2017-06-24: qty 5

## 2017-06-24 MED ORDER — ONDANSETRON HCL 4 MG PO TABS: 4.0000 mg | ORAL_TABLET | Freq: Four times a day (QID) | ORAL | Status: DC | PRN

## 2017-06-24 MED ORDER — ACETAMINOPHEN 650 MG RE SUPP: 650.0000 mg | Freq: Four times a day (QID) | RECTAL | Status: DC | PRN

## 2017-06-24 MED ORDER — ONDANSETRON HCL 4 MG/2ML IJ SOLN: 4.0000 mg | Freq: Four times a day (QID) | INTRAMUSCULAR | Status: DC | PRN

## 2017-06-24 MED ORDER — MORPHINE SULFATE (PF) 4 MG/ML IV SOLN
4.0000 mg | INTRAVENOUS | Status: DC | PRN
  Administered 2017-06-24: 4 mg via INTRAVENOUS
  Filled 2017-06-24: qty 1

## 2017-06-24 MED ORDER — METHOCARBAMOL 500 MG PO TABS
500.0000 mg | ORAL_TABLET | Freq: Four times a day (QID) | ORAL | Status: DC | PRN
  Administered 2017-06-24 – 2017-06-26 (×3): 500 mg via ORAL
  Filled 2017-06-24 (×2): qty 1

## 2017-06-24 MED ORDER — ONDANSETRON HCL 4 MG/2ML IJ SOLN
4.0000 mg | Freq: Once | INTRAMUSCULAR | Status: AC
  Administered 2017-06-24: 4 mg via INTRAVENOUS
  Filled 2017-06-24: qty 2

## 2017-06-24 MED ORDER — ACETAMINOPHEN 325 MG PO TABS: 650.0000 mg | ORAL_TABLET | Freq: Four times a day (QID) | ORAL | Status: DC | PRN

## 2017-06-24 MED ORDER — MORPHINE SULFATE (PF) 4 MG/ML IV SOLN
1.0000 mg | INTRAVENOUS | Status: DC | PRN
  Administered 2017-06-25 (×4): 1 mg via INTRAVENOUS
  Filled 2017-06-24 (×4): qty 1

## 2017-06-24 MED ORDER — OXYCODONE HCL 5 MG PO TABS
5.0000 mg | ORAL_TABLET | ORAL | Status: DC | PRN
  Administered 2017-06-24 – 2017-06-25 (×2): 10 mg via ORAL
  Filled 2017-06-24 (×2): qty 2

## 2017-06-24 NOTE — ED Triage Notes (Signed)
Pt presents from [facility] ortho office for evaluation of urinary incontinence intermittently x 1 week. Pt reports intermittently does not know when he has urge or need to urinate. Pt denies bowel incontinence. Pt reports recent hx of multiple falls. Pt able to stand and transfer to wheelchair from EMS stretcher. Pt reports chronic lower back pain.

## 2017-06-24 NOTE — Progress Notes (Signed)
Office Visit Note   Patient: [name]           Date of Birth: 05/10/1956           MRN: [id number] Visit Date: 06/24/2017              Requested by: Nolene Ebbs, MD [address] [place], [place] [address] PCP: Nolene Ebbs, MD   Assessment & Plan: Visit Diagnoses:  1. Radiculopathy, lumbar region   2. Urinary incontinence, unspecified type     Plan: I spoke with patient's behavioral health nurse Cipriano Bunker. Advised her of the complaint today of urinary incontinence. 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 Told patient and the nurse that at this point I recommend transfer to the emergency room due to the symptoms of urinary incontinence that he is describing and cauda equina syndrome must be ruled out. All questions answered.  Cipriano Bunker was going to be patient's transportation but states that she cannot be here immediately so nonemergent EMS was called.  Follow-Up Instructions: Return in about 4 weeks (around 07/22/2017).   Orders:  No orders of the defined types were placed in this encounter.  No orders of the defined types were placed in this encounter.     Procedures: No procedures performed   Clinical Data: No additional findings.   Subjective: Chief Complaint  Patient presents with  . Lower Back - Pain    HPI Patient being seen today for low back pain and bilateral lower extremity radiculopathy 1 month. Last month patient suffered a fall with direct impact to his low back in his apartment. Since that injury he's had multiple visits to the emergency room. CT lumbar spine performed 05/24/2017 and report read L2-L5 solid fusion. No hardware complications. Wide patency of the canal and foramina throughout the region. L5-S1 advanced bilateral facet ulcer arthritis. Disc degeneration with endplate osteophytes and circumferential bulging of the disc. Spinal stenosis and foraminal stenosis at this level could  cause neural compression on either both sides. He was seen by Dr. Erlinda Hong in our office last week and referred to [place] today. Patient complaining of ongoing pain down both legs. He is also complaining of urinary incontinence. States that he's had multiple episodes where he gets sharp pain down to his legs and he loses control of his bladder at that point. He's also had episodes where he is just sitting and all of a sudden notices that he is wet. Last time this occurred was yesterday. Status post L2-L5 fusion by Dr. Dimas [place]  2012. Patient has had a shunt placed by neurosurgery December 2017. Review of Systems No current complaints of cardiac pulmonary GI issues.  Objective: Vital Signs: BP 139/82 (BP Location: Left Arm, Patient Position: Sitting)   Pulse (!) 110   Ht 6' (1.829 m)   Wt 190 lb (86.2 kg)   BMI 25.77 kg/m   Physical Exam  Constitutional: No distress.  HENT:  Head: Normocephalic and atraumatic.  Eyes: Pupils are equal, round, and reactive to light.  Pulmonary/Chest: No respiratory distress.  Musculoskeletal:  Gait antalgic. Lumbar paraspinal tenderness. Bio sciatic notch tenderness. Negative logroll. Negative straight leg raise. Question trace bilateral anterior tib gastroc weakness.  Neurological: He is alert.    Ortho Exam  Specialty Comments:  No specialty comments available.  Imaging: No results found.   PMFS History: Patient Active Problem List   Diagnosis Date Noted  . NPH (normal pressure hydrocephalus) 10/30/2016  .  Normal pressure hydrocephalus 09/01/2016  . Bilateral sciatica 09/01/2016  . Altered mental status 07/01/2016  . Acute encephalopathy 07/01/2016  . Hydrocephalus in adult 07/01/2016  . Shaking spells 06/01/2016  . Gait disturbance 04/10/2015  . Numbness 04/10/2015  . Lumbar radicular pain 04/10/2015  . Lumbar spondylosis 04/10/2015  . Bipolar I disorder with depression, severe ([place]) 06/30/2014  . Suicidal ideations 06/30/2014  . Ulcer of  lower limb, unspecified 02/20/2014  . CELLULITIS AND ABSCESS OF LEG EXCEPT FOOT 11/08/2007   Past Medical History:  Diagnosis Date  . Anxiety   . Bipolar disorder ([place])   . Chronic back pain   . Depression   . Diabetes mellitus without complication ([place])   . Diabetic neuropathy ([place])   . DVT (deep venous thrombosis) ([place])   . Enlarged prostate   . Headache   . Hepatitis    Hepatitis C - has been treated with Harvoni (cleared in July, 2017)  . History of colon polyps   . Mental disorder    bipolar  . Peripheral vascular disease ([place])   . Pneumonia    hx of-about 21yrs ago  . Schizophrenia ([place])     Family History  Problem Relation Age of Onset  . Heart attack Father   . Anesthesia problems Neg Hx   . Hypotension Neg Hx   . Malignant hyperthermia Neg Hx   . Pseudochol deficiency Neg Hx     Past Surgical History:  Procedure Laterality Date  . APPENDECTOMY     early 30's  . BACK SURGERY  05/2011   x 5  . blood clot in groin  early 80's  . CARPAL TUNNEL RELEASE     left  . CERVICAL SPINE SURGERY  2009  . COLONOSCOPY    . HERNIA REPAIR     early 2000's  . SHOULDER SURGERY  70's   right  . VENTRICULOPERITONEAL SHUNT N/A 10/30/2016   Procedure: Right Ventricular Peritoneal Shunt;  Surgeon: Kary Kos, MD;  Location: [facility];  Service: Neurosurgery;  Laterality: N/A;   Social History   Occupational History  . Not on file.   Social History Main Topics  . Smoking status: Current Every Day Smoker    Packs/day: 1.00    Years: 40.00    Types: Cigarettes  . Smokeless tobacco: Never Used     Comment: pt states that he is ready to quit smoking but thinks he will need patches advised him to speak with his PCP which he is seaing this Thursday 3/26  . Alcohol use No  . Drug use: No  . Sexual activity: Yes

## 2017-06-24 NOTE — ED Notes (Signed)
[name]- call for discharge [phone] he is on the [facility] team.

## 2017-06-24 NOTE — Progress Notes (Addendum)
Patient arrived to floor via ED staff, pt oriented to unit/room. Questions answered. Vitals stable.  MD paged, pt requesting diet order. Continue to monitor. MD called back, verbalized patient can eat until breakfast in AM.

## 2017-06-24 NOTE — H&P (Addendum)
ORTHOPAEDIC HISTORY AND PHYSICAL   Chief Complaint: low back pain, BLE radicular pain, urinary incontinence, r/o cauda equina  HPI: [name] is a 61 y.o. male who complains of worsening BLE pain over the last week.  I evaluated patient in the office at that time.  He saw our PA today in the office with worsening pain and urinary incontinence over a week.  He states he has had weakness in his leg and trouble walking due to the pain.  He has had prior lumbar fusion surgery by Dr. Marcial Pacas.  Past Medical History:  Diagnosis Date  . Anxiety   . Bipolar disorder ([place])   . Chronic back pain   . Depression   . Diabetes mellitus without complication ([place])   . Diabetic neuropathy ([place])   . DVT (deep venous thrombosis) ([place])   . Enlarged prostate   . Headache   . Hepatitis    Hepatitis C - has been treated with Harvoni (cleared in July, 2017)  . History of colon polyps   . Mental disorder    bipolar  . Peripheral vascular disease ([place])   . Pneumonia    hx of-about 67yrs ago  . Schizophrenia [facility])    Past Surgical History:  Procedure Laterality Date  . APPENDECTOMY     early 76's  . BACK SURGERY  05/2011   x 5  . blood clot in groin  early 80's  . CARPAL TUNNEL RELEASE     left  . CERVICAL SPINE SURGERY  2009  . COLONOSCOPY    . HERNIA REPAIR     early 2000's  . SHOULDER SURGERY  70's   right  . VENTRICULOPERITONEAL SHUNT N/A 10/30/2016   Procedure: Right Ventricular Peritoneal Shunt;  Surgeon: Kary Kos, MD;  Location: [facility];  Service: Neurosurgery;  Laterality: N/A;   Social History   Social History  . Marital status: Single    Spouse name: N/A  . Number of children: N/A  . Years of education: N/A   Social History Main Topics  . Smoking status: Current Every Day Smoker    Packs/day: 1.00    Years: 40.00    Types: Cigarettes  . Smokeless tobacco: Never Used     Comment: pt states that he is ready to quit smoking but thinks he will need patches advised him to  speak with his PCP which he is seaing this Thursday 3/26  . Alcohol use No  . Drug use: No  . Sexual activity: Yes   Other Topics Concern  . None   Social History Narrative  . None   Family History  Problem Relation Age of Onset  . Heart attack Father   . Anesthesia problems Neg Hx   . Hypotension Neg Hx   . Malignant hyperthermia Neg Hx   . Pseudochol deficiency Neg Hx    Allergies  Allergen Reactions  . Penicillins Hives and Other (See Comments)    Has patient had a PCN reaction causing immediate rash, facial/tongue/throat swelling, SOB or lightheadedness with hypotension: No Has patient had a PCN reaction causing severe rash involving mucus membranes or skin necrosis: No Has patient had a PCN reaction that required hospitalization No Has patient had a PCN reaction occurring within the last 10 years: No If all of the above answers are "NO", then may proceed with Cephalosporin use.   Prior to Admission medications   Medication Sig Start Date End Date Taking? Authorizing Provider  cholecalciferol (VITAMIN D) 1000 units  tablet Take 1,000 Units by mouth daily.   Yes [provider]  diazepam (VALIUM) 2 MG tablet Take 1 tablet (2 mg total) by mouth every 6 (six) hours as needed for muscle spasms. 06/11/17  Yes Lacretia Leigh, MD  gabapentin (NEURONTIN) 300 MG capsule Take 300 mg by mouth at bedtime.  06/11/17  Yes [provider]  HYDROcodone-acetaminophen (NORCO) 5-325 MG tablet Take 1-2 tablets by mouth 2 (two) times daily as needed. 06/21/17  Yes Leandrew Koyanagi, MD  metFORMIN (GLUCOPHAGE) 500 MG tablet Take 500 mg by mouth daily with breakfast.    Yes [provider]  oxyCODONE-acetaminophen (PERCOCET/ROXICET) 5-325 MG tablet Take 1 tablet by mouth every 6 (six) hours as needed for severe pain. 06/05/17  Yes Lawyer, Harrell Gave, PA-C  paliperidone (INVEGA) 3 MG 24 hr tablet Take 3 mg by mouth at bedtime.  04/22/17  Yes [provider]  predniSONE  (DELTASONE) 50 MG tablet Take 1 tablet (50 mg total) by mouth daily. 06/11/17  Yes Lacretia Leigh, MD  QUEtiapine (SEROQUEL) 400 MG tablet Take 800 mg by mouth at bedtime. 05/14/17  Yes [provider]  risperiDONE (RISPERDAL) 1 MG tablet Take 1 tablet (1 mg total) by mouth 2 (two) times daily. 07/05/16  Yes Elgergawy, Silver Huguenin, MD  sertraline (ZOLOFT) 100 MG tablet Take 100 mg by mouth at bedtime.   Yes [provider]  sitaGLIPtin (JANUVIA) 100 MG tablet Take 100 mg by mouth daily with breakfast.   Yes [provider]  tamsulosin (FLOMAX) 0.4 MG CAPS capsule Take 0.4 mg by mouth daily after supper.    Yes [provider]  traZODone (DESYREL) 150 MG tablet Take 150 mg by mouth at bedtime. 04/22/17  Yes [provider]  cyclobenzaprine (FLEXERIL) 10 MG tablet Take 1 tablet (10 mg total) by mouth 3 (three) times daily as needed for muscle spasms. Patient not taking: Reported on 06/24/2017 06/05/17   Dalia Heading, PA-C  HYDROcodone-acetaminophen (NORCO/VICODIN) 5-325 MG tablet Take 1 tablet by mouth every 6 (six) hours as needed. Patient taking differently: Take 1 tablet by mouth every 6 (six) hours as needed for moderate pain or severe pain.  06/03/17   McDonald, Mia A, PA-C  ibuprofen (ADVIL,MOTRIN) 600 MG tablet Take 1 tablet (600 mg total) by mouth every 6 (six) hours as needed. 05/24/17   Drenda Freeze, MD  traMADol (ULTRAM) 50 MG tablet Take 1 tablet (50 mg total) by mouth every 6 (six) hours as needed. 06/11/17   Lacretia Leigh, MD   No results found. - pertinent xrays, CT, MRI studies were reviewed and independently interpreted  Positive ROS: All other systems have been reviewed and were otherwise negative with the exception of those mentioned in the HPI and as above.  Physical Exam: General: Alert, no acute distress Cardiovascular: No pedal edema Respiratory: No cyanosis, no use of accessory musculature GI: No organomegaly, abdomen is soft  and non-tender Skin: No lesions in the area of chief complaint Neurologic: Sensation intact distally Psychiatric: Patient is competent for consent with normal mood and affect Lymphatic: No axillary or cervical lymphadenopathy  MUSCULOSKELETAL:  - Hip flexion: 5-/5 - Knee extension: 5/5 - Ankle dorsiflexion: 5/5 - EHL extension: 5/5 - Ankle plantarflexion: 5/5 - Normal Babinski - no beats of clonus - hyporeflexic patellar reflexes - normal rectal tone - no saddle anesthesia - subjective decreased sensation to lateral thigh, medial and lateral calf, normal sensation to dorsum of foot  Assessment: Low back pain, severe  radicular pain  Plan: - doubt cauda equina syndrome - MRI r/o cauda equina - admit to ortho for pain control - patient was able to void 600 cc from his bladder and have bowel movement yesterday  N. Eduard Roux, MD [place] 7:48 PM

## 2017-06-25 ENCOUNTER — Encounter (HOSPITAL_COMMUNITY)
Admission: EM | Disposition: A | Discharge status: Skilled Nursing Facility | Payer: Self-pay | Source: Home / Self Care | Attending: Specialist

## 2017-06-25 ENCOUNTER — Inpatient Hospital Stay (HOSPITAL_COMMUNITY): Payer: Medicare Other | Admitting: Anesthesiology

## 2017-06-25 ENCOUNTER — Observation Stay (HOSPITAL_COMMUNITY): Payer: Medicare Other

## 2017-06-25 ENCOUNTER — Encounter (HOSPITAL_COMMUNITY): Payer: Self-pay | Admitting: Certified Registered"

## 2017-06-25 DIAGNOSIS — L7634 Postprocedural seroma of skin and subcutaneous tissue following other procedure: Secondary | ICD-10-CM | POA: Diagnosis not present

## 2017-06-25 DIAGNOSIS — M4807 Spinal stenosis, lumbosacral region: Secondary | ICD-10-CM | POA: Diagnosis present

## 2017-06-25 DIAGNOSIS — G8929 Other chronic pain: Secondary | ICD-10-CM | POA: Diagnosis present

## 2017-06-25 DIAGNOSIS — Y838 Other surgical procedures as the cause of abnormal reaction of the patient, or of later complication, without mention of misadventure at the time of the procedure: Secondary | ICD-10-CM | POA: Diagnosis not present

## 2017-06-25 DIAGNOSIS — Y92230 Patient room in hospital as the place of occurrence of the external cause: Secondary | ICD-10-CM | POA: Diagnosis not present

## 2017-06-25 DIAGNOSIS — N3941 Urge incontinence: Secondary | ICD-10-CM | POA: Diagnosis present

## 2017-06-25 DIAGNOSIS — Z880 Allergy status to penicillin: Secondary | ICD-10-CM | POA: Diagnosis not present

## 2017-06-25 DIAGNOSIS — M4317 Spondylolisthesis, lumbosacral region: Secondary | ICD-10-CM | POA: Diagnosis present

## 2017-06-25 DIAGNOSIS — G834 Cauda equina syndrome: Secondary | ICD-10-CM | POA: Diagnosis present

## 2017-06-25 DIAGNOSIS — F319 Bipolar disorder, unspecified: Secondary | ICD-10-CM | POA: Diagnosis present

## 2017-06-25 DIAGNOSIS — Z7984 Long term (current) use of oral hypoglycemic drugs: Secondary | ICD-10-CM | POA: Diagnosis not present

## 2017-06-25 DIAGNOSIS — Z982 Presence of cerebrospinal fluid drainage device: Secondary | ICD-10-CM | POA: Diagnosis not present

## 2017-06-25 DIAGNOSIS — Z7952 Long term (current) use of systemic steroids: Secondary | ICD-10-CM | POA: Diagnosis not present

## 2017-06-25 DIAGNOSIS — D500 Iron deficiency anemia secondary to blood loss (chronic): Secondary | ICD-10-CM | POA: Diagnosis not present

## 2017-06-25 DIAGNOSIS — Z9181 History of falling: Secondary | ICD-10-CM | POA: Diagnosis not present

## 2017-06-25 DIAGNOSIS — G959 Disease of spinal cord, unspecified: Secondary | ICD-10-CM | POA: Diagnosis present

## 2017-06-25 DIAGNOSIS — E1151 Type 2 diabetes mellitus with diabetic peripheral angiopathy without gangrene: Secondary | ICD-10-CM | POA: Diagnosis present

## 2017-06-25 DIAGNOSIS — F209 Schizophrenia, unspecified: Secondary | ICD-10-CM | POA: Diagnosis present

## 2017-06-25 DIAGNOSIS — M48061 Spinal stenosis, lumbar region without neurogenic claudication: Secondary | ICD-10-CM | POA: Diagnosis present

## 2017-06-25 DIAGNOSIS — Z981 Arthrodesis status: Secondary | ICD-10-CM | POA: Diagnosis not present

## 2017-06-25 DIAGNOSIS — F17210 Nicotine dependence, cigarettes, uncomplicated: Secondary | ICD-10-CM | POA: Diagnosis present

## 2017-06-25 DIAGNOSIS — M4316 Spondylolisthesis, lumbar region: Secondary | ICD-10-CM | POA: Diagnosis present

## 2017-06-25 DIAGNOSIS — E1140 Type 2 diabetes mellitus with diabetic neuropathy, unspecified: Secondary | ICD-10-CM | POA: Diagnosis present

## 2017-06-25 DIAGNOSIS — M4716 Other spondylosis with myelopathy, lumbar region: Secondary | ICD-10-CM | POA: Diagnosis present

## 2017-06-25 LAB — SURGICAL PCR SCREEN
MRSA, PCR: NEGATIVE
STAPHYLOCOCCUS AUREUS: NEGATIVE

## 2017-06-25 SURGERY — POSTERIOR LUMBAR FUSION 1 LEVEL
Anesthesia: General | Site: Back | Laterality: Bilateral

## 2017-06-25 MED ORDER — THROMBIN 20000 UNITS EX SOLR
CUTANEOUS | Status: AC
  Filled 2017-06-25: qty 20000

## 2017-06-25 MED ORDER — PROPOFOL 10 MG/ML IV BOLUS
INTRAVENOUS | Status: DC | PRN
  Administered 2017-06-25: 130 mg via INTRAVENOUS

## 2017-06-25 MED ORDER — SURGIFOAM 100 EX MISC
CUTANEOUS | Status: DC | PRN
  Administered 2017-06-25: 1 via TOPICAL

## 2017-06-25 MED ORDER — PHENYLEPHRINE 40 MCG/ML (10ML) SYRINGE FOR IV PUSH (FOR BLOOD PRESSURE SUPPORT)
PREFILLED_SYRINGE | INTRAVENOUS | Status: DC | PRN
  Administered 2017-06-25: 80 ug via INTRAVENOUS
  Administered 2017-06-25: 40 ug via INTRAVENOUS
  Administered 2017-06-25 (×3): 80 ug via INTRAVENOUS

## 2017-06-25 MED ORDER — BUPIVACAINE HCL 0.5 % IJ SOLN
INTRAMUSCULAR | Status: DC | PRN
  Administered 2017-06-25: 20 mL

## 2017-06-25 MED ORDER — PROPOFOL 10 MG/ML IV BOLUS
INTRAVENOUS | Status: AC
  Filled 2017-06-25: qty 20

## 2017-06-25 MED ORDER — CHLORHEXIDINE GLUCONATE 4 % EX LIQD
60.0000 mL | Freq: Once | CUTANEOUS | Status: DC
  Filled 2017-06-25: qty 60

## 2017-06-25 MED ORDER — CLINDAMYCIN PHOSPHATE 900 MG/50ML IV SOLN
INTRAVENOUS | Status: DC | PRN
  Administered 2017-06-25: 900 mg via INTRAVENOUS

## 2017-06-25 MED ORDER — FENTANYL CITRATE (PF) 250 MCG/5ML IJ SOLN
INTRAMUSCULAR | Status: AC
  Filled 2017-06-25: qty 5

## 2017-06-25 MED ORDER — LIDOCAINE 2% (20 MG/ML) 5 ML SYRINGE
INTRAMUSCULAR | Status: DC | PRN
  Administered 2017-06-25: 60 mg via INTRAVENOUS

## 2017-06-25 MED ORDER — ROCURONIUM BROMIDE 10 MG/ML (PF) SYRINGE
PREFILLED_SYRINGE | INTRAVENOUS | Status: DC | PRN
  Administered 2017-06-25: 60 mg via INTRAVENOUS
  Administered 2017-06-25: 40 mg via INTRAVENOUS
  Administered 2017-06-26: 20 mg via INTRAVENOUS
  Administered 2017-06-26: 10 mg via INTRAVENOUS

## 2017-06-25 MED ORDER — CLINDAMYCIN PHOSPHATE 900 MG/50ML IV SOLN
INTRAVENOUS | Status: AC
  Filled 2017-06-25: qty 50

## 2017-06-25 MED ORDER — LACTATED RINGERS IV SOLN
INTRAVENOUS | Status: DC | PRN
  Administered 2017-06-25 – 2017-06-26 (×3): via INTRAVENOUS

## 2017-06-25 MED ORDER — FENTANYL CITRATE (PF) 250 MCG/5ML IJ SOLN
INTRAMUSCULAR | Status: DC | PRN
  Administered 2017-06-25: 50 ug via INTRAVENOUS
  Administered 2017-06-25: 100 ug via INTRAVENOUS
  Administered 2017-06-26 (×2): 50 ug via INTRAVENOUS

## 2017-06-25 MED ORDER — BUPIVACAINE LIPOSOME 1.3 % IJ SUSP
20.0000 mL | Freq: Once | INTRAMUSCULAR | Status: AC
  Administered 2017-06-25: 20 mL
  Filled 2017-06-25: qty 20

## 2017-06-25 MED ORDER — HEMOSTATIC AGENTS (NO CHARGE) OPTIME
TOPICAL | Status: DC | PRN
  Administered 2017-06-25: 1 via TOPICAL

## 2017-06-25 MED ORDER — MIDAZOLAM HCL 2 MG/2ML IJ SOLN
INTRAMUSCULAR | Status: AC
  Filled 2017-06-25: qty 2

## 2017-06-25 MED ORDER — MIDAZOLAM HCL 5 MG/5ML IJ SOLN
INTRAMUSCULAR | Status: DC | PRN
  Administered 2017-06-25: 2 mg via INTRAVENOUS

## 2017-06-25 MED ORDER — 0.9 % SODIUM CHLORIDE (POUR BTL) OPTIME
TOPICAL | Status: DC | PRN
  Administered 2017-06-25: 1000 mL

## 2017-06-25 MED ORDER — EPHEDRINE SULFATE 50 MG/ML IJ SOLN
INTRAMUSCULAR | Status: DC | PRN
  Administered 2017-06-25: 5 mg via INTRAVENOUS

## 2017-06-25 MED ORDER — BUPIVACAINE HCL (PF) 0.5 % IJ SOLN
INTRAMUSCULAR | Status: AC
  Filled 2017-06-25: qty 30

## 2017-06-25 MED ORDER — THROMBIN 20000 UNITS EX SOLR
CUTANEOUS | Status: DC | PRN
  Administered 2017-06-25: 20000 [IU] via TOPICAL

## 2017-06-25 SURGICAL SUPPLY — 83 items
ADH SKN CLS APL DERMABOND .7 (GAUZE/BANDAGES/DRESSINGS) ×1
BONE VIVIGEN FORMABLE 5.4CC (Bone Implant) ×3 IMPLANT
BUR MATCHSTICK NEURO 3.0 LAGG (BURR) ×3 IMPLANT
BUR RND FLUTED 2.5 (BURR) IMPLANT
BUR SABER RD CUTTING 3.0 (BURR) IMPLANT
BUR SABER RD CUTTING 3.0MM (BURR)
CAGE CONCORDE LIFT 9X21 (Cage) ×2 IMPLANT
CAGE CONCORDE LIFT 9X21MM (Cage) ×2 IMPLANT
CONNECTOR EXPEDIUM TI 55MM (Connector) ×4 IMPLANT
COVER BACK TABLE 80X110 HD (DRAPES) ×3 IMPLANT
COVER MAYO STAND STRL (DRAPES) ×3 IMPLANT
COVER SURGICAL LIGHT HANDLE (MISCELLANEOUS) ×3 IMPLANT
DECANTER SPIKE VIAL GLASS SM (MISCELLANEOUS) ×3 IMPLANT
DERMABOND ADVANCED (GAUZE/BANDAGES/DRESSINGS) ×2
DERMABOND ADVANCED .7 DNX12 (GAUZE/BANDAGES/DRESSINGS) ×1 IMPLANT
DRAPE C-ARM 42X72 X-RAY (DRAPES) ×3 IMPLANT
DRAPE C-ARMOR (DRAPES) ×3 IMPLANT
DRAPE POUCH INSTRU U-SHP 10X18 (DRAPES) ×3 IMPLANT
DRAPE SURG 17X23 STRL (DRAPES) ×12 IMPLANT
DRSG MEPILEX BORDER 4X4 (GAUZE/BANDAGES/DRESSINGS) IMPLANT
DRSG MEPILEX BORDER 4X8 (GAUZE/BANDAGES/DRESSINGS) ×2 IMPLANT
DURAPREP 26ML APPLICATOR (WOUND CARE) ×3 IMPLANT
ELECT BLADE 6.5 EXT (BLADE) ×2 IMPLANT
ELECT CAUTERY BLADE 6.4 (BLADE) ×3 IMPLANT
ELECT REM PT RETURN 9FT ADLT (ELECTROSURGICAL) ×3
ELECTRODE REM PT RTRN 9FT ADLT (ELECTROSURGICAL) ×1 IMPLANT
EVACUATOR 1/8 PVC DRAIN (DRAIN) IMPLANT
GAUZE SPONGE 4X4 12PLY STRL (GAUZE/BANDAGES/DRESSINGS) ×1 IMPLANT
GLOVE BIO SURGEON STRL SZ 6.5 (GLOVE) ×1 IMPLANT
GLOVE BIO SURGEONS STRL SZ 6.5 (GLOVE) ×1
GLOVE BIOGEL PI IND STRL 7.0 (GLOVE) IMPLANT
GLOVE BIOGEL PI IND STRL 7.5 (GLOVE) IMPLANT
GLOVE BIOGEL PI IND STRL 8 (GLOVE) ×1 IMPLANT
GLOVE BIOGEL PI INDICATOR 7.0 (GLOVE) ×2
GLOVE BIOGEL PI INDICATOR 7.5 (GLOVE) ×2
GLOVE BIOGEL PI INDICATOR 8 (GLOVE)
GLOVE ECLIPSE 9.0 STRL (GLOVE) ×3 IMPLANT
GLOVE ORTHO TXT STRL SZ7.5 (GLOVE) ×3 IMPLANT
GLOVE SURG 8.5 LATEX PF (GLOVE) ×3 IMPLANT
GLOVE SURG SS PI 6.5 STRL IVOR (GLOVE) ×2 IMPLANT
GOWN STRL REUS W/ TWL LRG LVL3 (GOWN DISPOSABLE) ×1 IMPLANT
GOWN STRL REUS W/ TWL XL LVL3 (GOWN DISPOSABLE) IMPLANT
GOWN STRL REUS W/TWL 2XL LVL3 (GOWN DISPOSABLE) ×4 IMPLANT
GOWN STRL REUS W/TWL LRG LVL3 (GOWN DISPOSABLE) ×6
GOWN STRL REUS W/TWL XL LVL3 (GOWN DISPOSABLE) ×3
GRAFT BNE MATRIX VG FRMBL MD 5 (Bone Implant) IMPLANT
KIT BASIN OR (CUSTOM PROCEDURE TRAY) ×3 IMPLANT
KIT POSITION SURG JACKSON T1 (MISCELLANEOUS) ×3 IMPLANT
KIT ROOM TURNOVER OR (KITS) ×3 IMPLANT
NDL 18GX1X1/2 (RX/OR ONLY) (NEEDLE) IMPLANT
NDL ASP BONE MRW 11GX15 (NEEDLE) IMPLANT
NDL SPNL 18GX3.5 QUINCKE PK (NEEDLE) ×1 IMPLANT
NEEDLE 18GX1X1/2 (RX/OR ONLY) (NEEDLE) ×3 IMPLANT
NEEDLE 22X1 1/2 (OR ONLY) (NEEDLE) ×3 IMPLANT
NEEDLE ASP BONE MRW 11GX15 (NEEDLE) IMPLANT
NEEDLE BONE MARROW 8GAX6 (NEEDLE) IMPLANT
NEEDLE SPNL 18GX3.5 QUINCKE PK (NEEDLE) ×3 IMPLANT
NS IRRIG 1000ML POUR BTL (IV SOLUTION) ×3 IMPLANT
PACK LAMINECTOMY ORTHO (CUSTOM PROCEDURE TRAY) ×3 IMPLANT
PAD ARMBOARD 7.5X6 YLW CONV (MISCELLANEOUS) ×6 IMPLANT
PATTIES SURGICAL .75X.75 (GAUZE/BANDAGES/DRESSINGS) ×3 IMPLANT
PATTIES SURGICAL 1X1 (DISPOSABLE) ×3 IMPLANT
ROD EXPEDIUM PRE BENT 5.5X75 (Rod) ×4 IMPLANT
SCREW SET SINGLE INNER (Screw) ×4 IMPLANT
SCREW VIPER 7X45MM (Screw) ×4 IMPLANT
SPONGE LAP 18X18 X RAY DECT (DISPOSABLE) ×8 IMPLANT
SPONGE SURGIFOAM ABS GEL 100 (HEMOSTASIS) IMPLANT
SUT VIC AB 0 CT1 27 (SUTURE) ×3
SUT VIC AB 0 CT1 27XBRD ANBCTR (SUTURE) ×1 IMPLANT
SUT VIC AB 1 CTX 36 (SUTURE) ×6
SUT VIC AB 1 CTX36XBRD ANBCTR (SUTURE) ×2 IMPLANT
SUT VIC AB 2-0 CT1 27 (SUTURE)
SUT VIC AB 2-0 CT1 TAPERPNT 27 (SUTURE) ×1 IMPLANT
SUT VIC AB 3-0 X1 27 (SUTURE) ×3 IMPLANT
SYR 20CC LL (SYRINGE) ×5 IMPLANT
SYR CONTROL 10ML LL (SYRINGE) ×6 IMPLANT
TAP CANN VIPER2 DL 5.0 (TAP) ×2 IMPLANT
TAP CANN VIPER2 DL 6.0 (TAP) ×2 IMPLANT
TOWEL OR 17X24 6PK STRL BLUE (TOWEL DISPOSABLE) ×1 IMPLANT
TOWEL OR 17X26 10 PK STRL BLUE (TOWEL DISPOSABLE) ×5 IMPLANT
TRAY FOLEY W/METER SILVER 16FR (SET/KITS/TRAYS/PACK) ×3 IMPLANT
WATER STERILE IRR 1000ML POUR (IV SOLUTION) ×3 IMPLANT
YANKAUER SUCT BULB TIP NO VENT (SUCTIONS) ×3 IMPLANT

## 2017-06-25 NOTE — Anesthesia Procedure Notes (Signed)
Procedure Name: Intubation Date/Time: 06/25/2017 9:56 PM Performed by: Myna Bright Pre-anesthesia Checklist: Patient identified, Emergency Drugs available, Suction available and Patient being monitored Patient Re-evaluated:Patient Re-evaluated prior to induction Oxygen Delivery Method: Circle system utilized Preoxygenation: Pre-oxygenation with 100% oxygen Induction Type: IV induction Ventilation: Mask ventilation without difficulty Laryngoscope Size: Mac and 4 Grade View: Grade I Tube type: Oral Tube size: 7.5 mm Number of attempts: 1 Airway Equipment and Method: Stylet Placement Confirmation: ETT inserted through vocal cords under direct vision,  positive ETCO2 and breath sounds checked- equal and bilateral Secured at: 22 cm Tube secured with: Tape Dental Injury: Teeth and Oropharynx as per pre-operative assessment

## 2017-06-25 NOTE — Anesthesia Preprocedure Evaluation (Addendum)
Anesthesia Evaluation  Patient identified by MRN, date of birth, ID band Patient awake    Reviewed: Allergy & Precautions, H&P , NPO status , Patient's Chart, lab work & pertinent test results  Airway Mallampati: II  TM Distance: >3 FB Neck ROM: Full    Dental no notable dental hx. (+) Partial Lower, Partial Upper, Dental Advisory Given   Pulmonary Current Smoker,    Pulmonary exam normal breath sounds clear to auscultation       Cardiovascular negative cardio ROS   Rhythm:Regular Rate:Normal     Neuro/Psych  Headaches, Anxiety Depression Bipolar Disorder Schizophrenia    GI/Hepatic negative GI ROS, Neg liver ROS,   Endo/Other  diabetes, Type 2, Oral Hypoglycemic Agents  Renal/GU negative Renal ROS  negative genitourinary   Musculoskeletal  (+) Arthritis , Osteoarthritis,    Abdominal   Peds  Hematology negative hematology ROS (+)   Anesthesia Other Findings   Reproductive/Obstetrics negative OB ROS                            Anesthesia Physical Anesthesia Plan  ASA: II  Anesthesia Plan: General   Post-op Pain Management:    Induction: Intravenous  PONV Risk Score and Plan: 2 and Ondansetron, Dexamethasone and Midazolam  Airway Management Planned: Oral ETT  Additional Equipment:   Intra-op Plan:   Post-operative Plan: Extubation in OR  Informed Consent: I have reviewed the patients History and Physical, chart, labs and discussed the procedure including the risks, benefits and alternatives for the proposed anesthesia with the patient or authorized representative who has indicated his/her understanding and acceptance.   Dental advisory given  Plan Discussed with: CRNA  Anesthesia Plan Comments:        Anesthesia Quick Evaluation

## 2017-06-25 NOTE — Consult Note (Addendum)
Reason for Consult:Inability to walk, lumbago with sciatica,urinary incontinence. Referring Physician: Dr. Erlinda Hong Consulting Physician:Delia Slatten E Aiyah Scarpelli  Orthopedic Diagnosis:1)Severe lumbar spinal stenosis L5-S1, below L2-L5 fusion with interbody fusions L2-3, L3-4 and L4-5. Rods and screws L2-L5. 2)Spondylosis L5-S1. 3)Anterolisthesis L5-S1.Manifested on radiographs and with fluid signals within the facet joints at L5-S1 consistent with an Empty facet sign. 4)Bipolar disorder 5) Diabetes Mellitus   NZV:JKQAS [name] is an 61 y.o. male who has been experiencing weakness in his legs and pain radiating into the posterior thighs and calves and into the feet following a fall onto his back 3 weeks ago. He has a history of previous lumbar fusion surgery by Dr. Dimas Alexandria 5 years ago from L2-L5. 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 Did well until a fall 3 weeks ago and had had inability to walk more than 150 feet and is experiencing pain associated with incontinence of bladder when the pain is severe. Seen in the office yesterday and evaluated with complaints of bilateral buttock and posterior thigh and leg numbness and weakness and loss of bladder control. He was referred to the ER for evaluation and MRI has been done showing severe L5-S1 stenosis findings with fluid within the facets at L5-S1.    Past Medical History:  Diagnosis Date  . Anxiety   . Bipolar disorder ([place])   . Chronic back pain   . Depression   . Diabetes mellitus without complication ([place])   . Diabetic neuropathy ([place])   . DVT (deep venous thrombosis) ([place])   . Enlarged prostate   . Headache   . Hepatitis    Hepatitis C - has been treated with Harvoni (cleared in July, 2017)  . History of colon polyps   . Mental disorder    bipolar  . Peripheral vascular disease ([place])   . Pneumonia    hx of-about 31yr ago  . Schizophrenia ([facility]     Past Surgical History:  Procedure Laterality Date  .  APPENDECTOMY     early 962's . BACK SURGERY  05/2011   x 5  . blood clot in groin  early 80's  . CARPAL TUNNEL RELEASE     left  . CERVICAL SPINE SURGERY  2009  . COLONOSCOPY    . HERNIA REPAIR     early 2000's  . SHOULDER SURGERY  70's   right  . VENTRICULOPERITONEAL SHUNT N/A 10/30/2016   Procedure: Right Ventricular Peritoneal Shunt;  Surgeon: [name];  [facility] [facility]  Service: Neurosurgery;  Laterality: N/A;    Family History  Problem Relation Age of Onset  . Heart attack Father   . Anesthesia problems Neg Hx   . Hypotension Neg Hx   . Malignant hyperthermia Neg Hx   . Pseudochol deficiency Neg Hx     Social History:  reports that he has been smoking Cigarettes.  He has a 40.00 pack-year smoking history. He has never used smokeless tobacco. He reports that he does not drink alcohol or use drugs.  Allergies:  Allergies  Allergen Reactions  . Penicillins Hives and Other (See Comments)    Has patient had a PCN reaction causing immediate rash, facial/tongue/throat swelling, SOB or lightheadedness with hypotension: No Has patient had a PCN reaction causing severe rash involving mucus membranes or skin necrosis: No Has patient had a PCN reaction that required hospitalization No Has patient had a PCN reaction occurring within the last 10 years: No If all of the above  answers are "NO", then may proceed with Cephalosporin use.    Medications:  Prior to Admission:  Prescriptions Prior to Admission  Medication Sig Dispense Refill Last Dose  . cholecalciferol (VITAMIN D) 1000 units tablet Take 1,000 Units by mouth daily.   06/24/2017 at Unknown time  . diazepam (VALIUM) 2 MG tablet Take 1 tablet (2 mg total) by mouth every 6 (six) hours as needed for muscle spasms. 15 tablet 0 Past Week at Unknown time  . gabapentin (NEURONTIN) 300 MG capsule Take 300 mg by mouth at bedtime.    06/23/2017 at Unknown time  . HYDROcodone-acetaminophen (NORCO) 5-325 MG tablet Take 1-2 tablets  by mouth 2 (two) times daily as needed. 30 tablet 0 Past Week at Unknown time  . metFORMIN (GLUCOPHAGE) 500 MG tablet Take 500 mg by mouth daily with breakfast.    06/24/2017 at Unknown time  . oxyCODONE-acetaminophen (PERCOCET/ROXICET) 5-325 MG tablet Take 1 tablet by mouth every 6 (six) hours as needed for severe pain. 15 tablet 0 Past Week at Unknown time  . paliperidone (INVEGA) 3 MG 24 hr tablet Take 3 mg by mouth at bedtime.    06/23/2017 at Unknown time  . predniSONE (DELTASONE) 50 MG tablet Take 1 tablet (50 mg total) by mouth daily. 4 tablet 0 06/23/2017 at Unknown time  . QUEtiapine (SEROQUEL) 400 MG tablet Take 800 mg by mouth at bedtime.   06/23/2017 at Unknown time  . risperiDONE (RISPERDAL) 1 MG tablet Take 1 tablet (1 mg total) by mouth 2 (two) times daily. 60 tablet 0 06/24/2017 at Unknown time  . sertraline (ZOLOFT) 100 MG tablet Take 100 mg by mouth at bedtime.   06/23/2017 at Unknown time  . sitaGLIPtin (JANUVIA) 100 MG tablet Take 100 mg by mouth daily with breakfast.   06/24/2017 at Unknown time  . tamsulosin (FLOMAX) 0.4 MG CAPS capsule Take 0.4 mg by mouth daily after supper.    06/23/2017 at Unknown time  . traZODone (DESYREL) 150 MG tablet Take 150 mg by mouth at bedtime.   06/23/2017 at Unknown time  . cyclobenzaprine (FLEXERIL) 10 MG tablet Take 1 tablet (10 mg total) by mouth 3 (three) times daily as needed for muscle spasms. (Patient not taking: Reported on 06/24/2017) 15 tablet 0 Not Taking at Unknown time  . HYDROcodone-acetaminophen (NORCO/VICODIN) 5-325 MG tablet Take 1 tablet by mouth every 6 (six) hours as needed. (Patient taking differently: Take 1 tablet by mouth every 6 (six) hours as needed for moderate pain or severe pain. ) 10 tablet 0 Taking  . ibuprofen (ADVIL,MOTRIN) 600 MG tablet Take 1 tablet (600 mg total) by mouth every 6 (six) hours as needed. 30 tablet 0 PRN at unknown  . traMADol (ULTRAM) 50 MG tablet Take 1 tablet (50 mg total) by mouth every 6 (six) hours as  needed. 15 tablet 0 PRN   Scheduled: . ketorolac  30 mg Intravenous Q6H   Continuous: . methocarbamol (ROBAXIN)  IV     EXH:BZJIRCVELFYBO **OR** acetaminophen, methocarbamol **OR** methocarbamol (ROBAXIN)  IV, morphine injection, ondansetron **OR** ondansetron (ZOFRAN) IV, oxyCODONE  Results for orders placed or performed during the hospital encounter of 06/24/17 (from the past 48 hour(s))  Comprehensive metabolic panel     Status: Abnormal   Collection Time: 06/24/17  7:20 PM  Result Value Ref Range   Sodium 141 135 - 145 mmol/L   Potassium 3.8 3.5 - 5.1 mmol/L   Chloride 103 101 - 111 mmol/L   CO2 29 22 -  32 mmol/L   Glucose, Bld 119 (H) 65 - 99 mg/dL   BUN 8 6 - 20 mg/dL   Creatinine, Ser 0.84 0.61 - 1.24 mg/dL   Calcium 9.5 8.9 - 10.3 mg/dL   Total Protein 7.7 6.5 - 8.1 g/dL   Albumin 3.9 3.5 - 5.0 g/dL   AST 18 15 - 41 U/L   ALT 12 (L) 17 - 63 U/L   Alkaline Phosphatase 89 38 - 126 U/L   Total Bilirubin 0.6 0.3 - 1.2 mg/dL   GFR calc non Af Amer >60 >60 mL/min   GFR calc Af Amer >60 >60 mL/min    Comment: (NOTE) The eGFR has been calculated using the CKD EPI equation. This calculation has not been validated in all clinical situations. eGFR's persistently <60 mL/min signify possible Chronic Kidney Disease.    Anion gap 9 5 - 15  CBC with Differential/Platelet     Status: Abnormal   Collection Time: 06/24/17  7:20 PM  Result Value Ref Range   WBC 8.6 4.0 - 10.5 K/uL   RBC 4.21 (L) 4.22 - 5.81 MIL/uL   Hemoglobin 13.2 13.0 - 17.0 g/dL   HCT 38.4 (L) 39.0 - 52.0 %   MCV 91.2 78.0 - 100.0 fL   MCH 31.4 26.0 - 34.0 pg   MCHC 34.4 30.0 - 36.0 g/dL   RDW 11.4 (L) 11.5 - 15.5 %   Platelets 244 150 - 400 K/uL   Neutrophils Relative % 46 %   Neutro Abs 4.0 1.7 - 7.7 K/uL   Lymphocytes Relative 41 %   Lymphs Abs 3.6 0.7 - 4.0 K/uL   Monocytes Relative 9 %   Monocytes Absolute 0.7 0.1 - 1.0 K/uL   Eosinophils Relative 3 %   Eosinophils Absolute 0.3 0.0 - 0.7 K/uL    Basophils Relative 1 %   Basophils Absolute 0.1 0.0 - 0.1 K/uL  Surgical PCR screen     Status: None   Collection Time: 06/25/17 12:44 PM  Result Value Ref Range   MRSA, PCR NEGATIVE NEGATIVE   Staphylococcus aureus NEGATIVE NEGATIVE    Comment:        The Xpert SA Assay (FDA approved for NASAL specimens in patients over 106 years of age), is one component of a comprehensive surveillance program.  Test performance has been validated by [facility] for patients greater than or equal to 53 year old. It is not intended to diagnose infection nor to guide or monitor treatment.     Mr Lumbar Spine Wo Contrast  Result Date: 06/25/2017 CLINICAL DATA:  61 y/o M; worsening bilateral lower extremity pain over the last week. Urinary incontinence. EXAM: MRI LUMBAR SPINE WITHOUT CONTRAST TECHNIQUE: Multiplanar, multisequence MR imaging of the lumbar spine was performed. No intravenous contrast was administered. COMPARISON:  06/05/2017 lumbar spine radiographs. 05/24/2017 lumbar spine CT. 08/23/2007 lumbar spine MRI. FINDINGS: Segmentation:  Standard. Alignment:  Physiologic. Vertebrae: Mild edema and bilateral L5-S1 facet joints extending into the pedicles with trace facet effusions. No evidence for discitis or acute fracture. Susceptibility artifact from posterior instrumentation hardware at the L2 through L5 levels partially obscures the vertebral bodies at those levels. Conus medullaris: Extends to the L1 level and appears normal. Paraspinal and other soft tissues: Left kidney interpolar 11 mm T2 hyperintense structure, likely cyst. 3 mm cyst in the right interpolar kidney. There is a fluid collection measuring up to 7 mm thickness surrounding the left-sided lumbar fusion. Rod with extending from the L2-3 to L5-S1  levels. No significant edema surrounding the collection, likely representing seroma. Disc levels: L1-2: Minimal disc bulge and mild facet hypertrophy. No significant foraminal or canal  stenosis. L2-3: No disc displacement. Mild facet hypertrophy. No significant foraminal or canal stenosis. L3-4: No disc displacement. Mild facet hypertrophy. No significant foraminal or canal stenosis. L4-5: Discectomy. Right-sided endplate marginal osteophytes marginal osteophytes and prominent facet hypertrophy with moderate right foraminal stenosis. No significant canal or left foraminal stenosis. L5-S1: Small disc bulge with moderate disc and ligamentum flavum hypertrophy. Moderate right and mild left foraminal stenosis. Severe canal stenosis. IMPRESSION: 1. L2-L5 posterior instrumented fusion. Small seroma surrounding the left-sided vertical rod. 2. Edema of the bilateral L5-S1 facet joints, likely degenerative. 3. L5-S1 adjacent segment disease with multifactorial severe canal stenosis. 4. Moderate right L4-5, moderate right L5-S1, and mild left L5-S1 foraminal stenosis. Electronically Signed   By: Kristine Garbe M.D.   On: 06/25/2017 03:16    ROS Blood pressure 116/65, pulse 73, temperature 98.2 F (36.8 C), temperature source Oral, resp. rate 18, height 6' (1.829 m), weight 190 lb (86.2 kg), SpO2 96 %. Physical Exam: General: Alert, no acute distress Cardiovascular: No pedal edema Respiratory: No cyanosis, no use of accessory musculature GI: No organomegaly, abdomen is soft and non-tender Skin: No lesions in the area of chief complaint Neurologic: Sensation intact distally Psychiatric: Patient is competent for consent with normal mood and affect Lymphatic: No axillary or cervical lymphadenopathy   Orthopaedic Exam: Awake, alert and oriented x 4. Lying on hospital bed. Positive SLR both sides at 30-40 degrees. Gastro weakness 4/5 bilaterally, foot dorsiflexion weakness 4/5 bilaterally. EHL is 4/5 bilaterally. Pain with extension, and unable to bend finger tips below the knee. Rectal tone is normal.  Assessment/Plan: 1)Severe lumbar spinal stenosis L5-S1, below L2-L5 fusion with  interbody fusions L2-3, L3-4 and L4-5. Rods and screws L2-L5. 2)Spondylosis L5-S1. 3)Anterolisthesis L5-S1.Manifested on radiographs and with fluid signals within the facet joints at L5-S1 consistent with an Empty facet sign. 4)Bipolar disorder 5) Diabetes Mellitus  Plan: I discussed options with [name] including epidural steroids, facet injections and physical therapy. These would likely be unsuccessful as he is already showing signs of lumbar radiculopathy and Sciatica. There is significant stress at this segment associated with the 3 level fusion L2-L5 above and the solid sacral and pelvis structures below this level. The degree of stenosis is likely to persist and remain  Symptomatic with these conservative measures. This is a degenerative spondylolisthesis with severe stenosis and the appropriate treatment is therefore decompression and extension of the lumbar fusion to include the lumbosacral junction. The bladder symptoms he may be experiencing are likely due to cauda equina compression intermittantly or severe pain in either care surgical treatment offers the best solution For longer term relief of his pain and improved standing and walking tolerance. The risks of surgery discussed with [name] and he wishes to proceed with surgical decompression and fusion with rods, screws, Cages and local and allograft bone graft. RIsk of infection 1 in 100, risk of blood transfusion less than 1 percent, risk to the nerve roots is one in 1,000.  The surgery will be schedule today. Expect 2-3 days of recuperation in the hospital then a SNF for 2 weeks post hospitalization for recovery to independence and then he may return to his apartment where he lives alone.    [name] 06/25/2017, 2:50 PM

## 2017-06-26 ENCOUNTER — Inpatient Hospital Stay (HOSPITAL_COMMUNITY): Payer: Medicare Other

## 2017-06-26 DIAGNOSIS — M48061 Spinal stenosis, lumbar region without neurogenic claudication: Secondary | ICD-10-CM

## 2017-06-26 DIAGNOSIS — G834 Cauda equina syndrome: Secondary | ICD-10-CM

## 2017-06-26 DIAGNOSIS — M4316 Spondylolisthesis, lumbar region: Secondary | ICD-10-CM

## 2017-06-26 DIAGNOSIS — Z981 Arthrodesis status: Secondary | ICD-10-CM

## 2017-06-26 LAB — GLUCOSE, CAPILLARY
GLUCOSE-CAPILLARY: 167 mg/dL — AB (ref 65–99)
GLUCOSE-CAPILLARY: 205 mg/dL — AB (ref 65–99)
GLUCOSE-CAPILLARY: 170 mg/dL — AB (ref 65–99)
GLUCOSE-CAPILLARY: 160 mg/dL — AB (ref 65–99)
GLUCOSE-CAPILLARY: 195 mg/dL — AB (ref 65–99)

## 2017-06-26 LAB — CBC
HCT: 33.6 % — ABNORMAL LOW (ref 39.0–52.0)
HEMOGLOBIN: 11.3 g/dL — AB (ref 13.0–17.0)
MCH: 31 pg (ref 26.0–34.0)
MCHC: 33.6 g/dL (ref 30.0–36.0)
MCV: 92.1 fL (ref 78.0–100.0)
PLATELETS: 226 10*3/uL (ref 150–400)
RBC: 3.65 MIL/uL — AB (ref 4.22–5.81)
RDW: 11.6 % (ref 11.5–15.5)
WBC: 11.3 10*3/uL — AB (ref 4.0–10.5)

## 2017-06-26 LAB — BASIC METABOLIC PANEL
Anion gap: 5 (ref 5–15)
BUN: 12 mg/dL (ref 6–20)
CO2: 28 mmol/L (ref 22–32)
CREATININE: 0.96 mg/dL (ref 0.61–1.24)
Calcium: 8.8 mg/dL — ABNORMAL LOW (ref 8.9–10.3)
Chloride: 104 mmol/L (ref 101–111)
GFR calc Af Amer: >60 mL/min (ref >60)
GLUCOSE: 215 mg/dL — AB (ref 65–99)
POTASSIUM: 4 mmol/L (ref 3.5–5.1)
SODIUM: 137 mmol/L (ref 135–145)

## 2017-06-26 MED ORDER — OXYCODONE HCL 5 MG PO TABS
5.0000 mg | ORAL_TABLET | ORAL | Status: DC | PRN
  Administered 2017-06-26 (×2): 10 mg via ORAL
  Filled 2017-06-26: qty 1
  Filled 2017-06-26: qty 2
  Filled 2017-06-26: qty 1
  Filled 2017-06-26 (×2): qty 2

## 2017-06-26 MED ORDER — POVIDONE-IODINE 10 % EX SWAB: 2.0000 "application " | Freq: Once | CUTANEOUS | Status: DC

## 2017-06-26 MED ORDER — METHOCARBAMOL 1000 MG/10ML IJ SOLN
500.0000 mg | Freq: Four times a day (QID) | INTRAVENOUS | Status: DC | PRN
  Filled 2017-06-26: qty 5

## 2017-06-26 MED ORDER — ONDANSETRON HCL 4 MG PO TABS: 4.0000 mg | ORAL_TABLET | Freq: Four times a day (QID) | ORAL | Status: DC | PRN

## 2017-06-26 MED ORDER — ACETAMINOPHEN 325 MG PO TABS
650.0000 mg | ORAL_TABLET | ORAL | Status: DC | PRN
  Administered 2017-06-28 – 2017-06-29 (×4): 650 mg via ORAL
  Filled 2017-06-26 (×5): qty 2

## 2017-06-26 MED ORDER — GABAPENTIN 300 MG PO CAPS
300.0000 mg | ORAL_CAPSULE | Freq: Every day | ORAL | Status: DC
  Administered 2017-06-26 – 2017-06-29 (×5): 300 mg via ORAL
  Filled 2017-06-26 (×5): qty 1

## 2017-06-26 MED ORDER — HYDROMORPHONE HCL 1 MG/ML IJ SOLN
0.2500 mg | INTRAMUSCULAR | Status: DC | PRN
  Administered 2017-06-26 (×4): 0.5 mg via INTRAVENOUS

## 2017-06-26 MED ORDER — SODIUM CHLORIDE 0.9 % IV SOLN
INTRAVENOUS | Status: DC
  Administered 2017-06-26: 09:00:00 via INTRAVENOUS

## 2017-06-26 MED ORDER — CHLORHEXIDINE GLUCONATE 4 % EX LIQD
60.0000 mL | Freq: Once | CUTANEOUS | Status: DC
  Filled 2017-06-26: qty 60

## 2017-06-26 MED ORDER — HYDROMORPHONE HCL 1 MG/ML IJ SOLN
INTRAMUSCULAR | Status: AC
  Administered 2017-06-26: 0.5 mg via INTRAVENOUS
  Filled 2017-06-26: qty 1

## 2017-06-26 MED ORDER — CELECOXIB 200 MG PO CAPS
200.0000 mg | ORAL_CAPSULE | Freq: Two times a day (BID) | ORAL | Status: DC
  Administered 2017-06-26 – 2017-06-29 (×7): 200 mg via ORAL
  Filled 2017-06-26 (×9): qty 1

## 2017-06-26 MED ORDER — HYDROCODONE-ACETAMINOPHEN 7.5-325 MG PO TABS
1.0000 | ORAL_TABLET | Freq: Four times a day (QID) | ORAL | Status: DC
  Administered 2017-06-26 – 2017-06-27 (×5): 1 via ORAL
  Filled 2017-06-26 (×5): qty 1

## 2017-06-26 MED ORDER — PANTOPRAZOLE SODIUM 40 MG IV SOLR
40.0000 mg | Freq: Every day | INTRAVENOUS | Status: DC
  Administered 2017-06-26 – 2017-06-29 (×5): 40 mg via INTRAVENOUS
  Filled 2017-06-26 (×5): qty 40

## 2017-06-26 MED ORDER — PHENYLEPHRINE HCL 10 MG/ML IJ SOLN
INTRAMUSCULAR | Status: DC | PRN
  Administered 2017-06-26: 50 ug/min via INTRAVENOUS

## 2017-06-26 MED ORDER — EPHEDRINE 5 MG/ML INJ
INTRAVENOUS | Status: AC
  Filled 2017-06-26: qty 10

## 2017-06-26 MED ORDER — SUGAMMADEX SODIUM 500 MG/5ML IV SOLN
INTRAVENOUS | Status: DC | PRN
  Administered 2017-06-26: 300 mg via INTRAVENOUS

## 2017-06-26 MED ORDER — METHOCARBAMOL 500 MG PO TABS
ORAL_TABLET | ORAL | Status: AC
  Filled 2017-06-26: qty 1

## 2017-06-26 MED ORDER — PHENYLEPHRINE 40 MCG/ML (10ML) SYRINGE FOR IV PUSH (FOR BLOOD PRESSURE SUPPORT)
PREFILLED_SYRINGE | INTRAVENOUS | Status: AC
  Filled 2017-06-26: qty 10

## 2017-06-26 MED ORDER — BISACODYL 5 MG PO TBEC: 5.0000 mg | DELAYED_RELEASE_TABLET | Freq: Every day | ORAL | Status: DC | PRN

## 2017-06-26 MED ORDER — ROCURONIUM BROMIDE 10 MG/ML (PF) SYRINGE
PREFILLED_SYRINGE | INTRAVENOUS | Status: AC
  Filled 2017-06-26: qty 5

## 2017-06-26 MED ORDER — SODIUM CHLORIDE 0.9% FLUSH: 3.0000 mL | INTRAVENOUS | Status: DC | PRN

## 2017-06-26 MED ORDER — CLINDAMYCIN PHOSPHATE 600 MG/50ML IV SOLN
600.0000 mg | Freq: Three times a day (TID) | INTRAVENOUS | Status: AC
  Administered 2017-06-26 (×3): 600 mg via INTRAVENOUS
  Filled 2017-06-26 (×3): qty 50

## 2017-06-26 MED ORDER — PHENOL 1.4 % MT LIQD: 1.0000 | OROMUCOSAL | Status: DC | PRN

## 2017-06-26 MED ORDER — SUGAMMADEX SODIUM 500 MG/5ML IV SOLN
INTRAVENOUS | Status: AC
  Filled 2017-06-26: qty 5

## 2017-06-26 MED ORDER — METHOCARBAMOL 500 MG PO TABS
500.0000 mg | ORAL_TABLET | Freq: Four times a day (QID) | ORAL | Status: DC | PRN
  Administered 2017-06-28 – 2017-06-29 (×3): 500 mg via ORAL
  Filled 2017-06-26 (×4): qty 1

## 2017-06-26 MED ORDER — DIAZEPAM 2 MG PO TABS
2.0000 mg | ORAL_TABLET | Freq: Four times a day (QID) | ORAL | Status: DC | PRN
Start: 2017-06-26 — End: 2017-06-28
  Administered 2017-06-26: 2 mg via ORAL
  Filled 2017-06-26: qty 1

## 2017-06-26 MED ORDER — INSULIN ASPART 100 UNIT/ML ~~LOC~~ SOLN
0.0000 [IU] | Freq: Three times a day (TID) | SUBCUTANEOUS | Status: DC
  Administered 2017-06-26: 5 [IU] via SUBCUTANEOUS
  Administered 2017-06-26 – 2017-06-27 (×4): 3 [IU] via SUBCUTANEOUS
  Administered 2017-06-27: 2 [IU] via SUBCUTANEOUS
  Administered 2017-06-28: 5 [IU] via SUBCUTANEOUS
  Administered 2017-06-28: 3 [IU] via SUBCUTANEOUS
  Administered 2017-06-28: 5 [IU] via SUBCUTANEOUS
  Administered 2017-06-29: 8 [IU] via SUBCUTANEOUS
  Administered 2017-06-29 – 2017-06-30 (×3): 5 [IU] via SUBCUTANEOUS

## 2017-06-26 MED ORDER — SODIUM CHLORIDE 0.9 % IV SOLN: 250.0000 mL | INTRAVENOUS | Status: DC

## 2017-06-26 MED ORDER — MENTHOL 3 MG MT LOZG: 1.0000 | LOZENGE | OROMUCOSAL | Status: DC | PRN

## 2017-06-26 MED ORDER — QUETIAPINE FUMARATE 400 MG PO TABS
800.0000 mg | ORAL_TABLET | Freq: Every day | ORAL | Status: DC
  Administered 2017-06-26 – 2017-06-29 (×4): 800 mg via ORAL
  Filled 2017-06-26 (×4): qty 2

## 2017-06-26 MED ORDER — ONDANSETRON HCL 4 MG/2ML IJ SOLN
INTRAMUSCULAR | Status: DC | PRN
Start: 2017-06-26 — End: 2017-06-26
  Administered 2017-06-26: 4 mg via INTRAVENOUS

## 2017-06-26 MED ORDER — ONDANSETRON HCL 4 MG/2ML IJ SOLN: 4.0000 mg | Freq: Four times a day (QID) | INTRAMUSCULAR | Status: DC | PRN

## 2017-06-26 MED ORDER — TAMSULOSIN HCL 0.4 MG PO CAPS
0.4000 mg | ORAL_CAPSULE | Freq: Every day | ORAL | Status: DC
  Administered 2017-06-26 – 2017-06-29 (×4): 0.4 mg via ORAL
  Filled 2017-06-26 (×4): qty 1

## 2017-06-26 MED ORDER — ONDANSETRON HCL 4 MG/2ML IJ SOLN
INTRAMUSCULAR | Status: AC
  Filled 2017-06-26: qty 2

## 2017-06-26 MED ORDER — TRAZODONE HCL 150 MG PO TABS
150.0000 mg | ORAL_TABLET | Freq: Every day | ORAL | Status: DC
  Administered 2017-06-26 – 2017-06-29 (×4): 150 mg via ORAL
  Filled 2017-06-26 (×4): qty 1

## 2017-06-26 MED ORDER — VITAMIN D 1000 UNITS PO TABS
1000.0000 [IU] | ORAL_TABLET | Freq: Every day | ORAL | Status: DC
  Administered 2017-06-26 – 2017-06-29 (×3): 1000 [IU] via ORAL
  Filled 2017-06-26 (×9): qty 1

## 2017-06-26 MED ORDER — PALIPERIDONE ER 3 MG PO TB24
3.0000 mg | ORAL_TABLET | Freq: Every day | ORAL | Status: DC
  Administered 2017-06-27 – 2017-06-29 (×3): 3 mg via ORAL
  Filled 2017-06-26 (×5): qty 1

## 2017-06-26 MED ORDER — SODIUM CHLORIDE 0.9% FLUSH
3.0000 mL | Freq: Two times a day (BID) | INTRAVENOUS | Status: DC
  Administered 2017-06-26 – 2017-06-29 (×9): 3 mL via INTRAVENOUS

## 2017-06-26 MED ORDER — SODIUM CHLORIDE 0.9 % IV SOLN
INTRAVENOUS | Status: DC
  Administered 2017-06-27: 02:00:00 via INTRAVENOUS

## 2017-06-26 MED ORDER — FLEET ENEMA 7-19 GM/118ML RE ENEM: 1.0000 | ENEMA | Freq: Once | RECTAL | Status: DC | PRN

## 2017-06-26 MED ORDER — RISPERIDONE 1 MG PO TABS
1.0000 mg | ORAL_TABLET | Freq: Two times a day (BID) | ORAL | Status: DC
  Administered 2017-06-26 – 2017-06-29 (×7): 1 mg via ORAL
  Filled 2017-06-26 (×2): qty 1
  Filled 2017-06-26 (×2): qty 2
  Filled 2017-06-26: qty 1
  Filled 2017-06-26 (×2): qty 2
  Filled 2017-06-26 (×3): qty 1
  Filled 2017-06-26 (×3): qty 2
  Filled 2017-06-26 (×2): qty 1
  Filled 2017-06-26: qty 2
  Filled 2017-06-26: qty 1

## 2017-06-26 MED ORDER — ROCURONIUM BROMIDE 10 MG/ML (PF) SYRINGE
PREFILLED_SYRINGE | INTRAVENOUS | Status: AC
  Filled 2017-06-26: qty 10

## 2017-06-26 MED ORDER — ACETAMINOPHEN 650 MG RE SUPP: 650.0000 mg | RECTAL | Status: DC | PRN

## 2017-06-26 MED ORDER — GABAPENTIN 300 MG PO CAPS
300.0000 mg | ORAL_CAPSULE | Freq: Once | ORAL | Status: AC
  Administered 2017-06-26: 300 mg via ORAL
  Filled 2017-06-26: qty 1

## 2017-06-26 MED ORDER — DOCUSATE SODIUM 100 MG PO CAPS
100.0000 mg | ORAL_CAPSULE | Freq: Two times a day (BID) | ORAL | Status: DC
  Administered 2017-06-26 – 2017-06-29 (×6): 100 mg via ORAL
  Filled 2017-06-26 (×9): qty 1

## 2017-06-26 MED ORDER — SERTRALINE HCL 100 MG PO TABS
100.0000 mg | ORAL_TABLET | Freq: Every day | ORAL | Status: DC
  Administered 2017-06-26 – 2017-06-29 (×5): 100 mg via ORAL
  Filled 2017-06-26 (×5): qty 1

## 2017-06-26 MED ORDER — LIDOCAINE 2% (20 MG/ML) 5 ML SYRINGE
INTRAMUSCULAR | Status: AC
  Filled 2017-06-26: qty 5

## 2017-06-26 MED ORDER — VANCOMYCIN HCL IN DEXTROSE 1-5 GM/200ML-% IV SOLN: 1000.0000 mg | INTRAVENOUS | Status: DC

## 2017-06-26 MED ORDER — POLYETHYLENE GLYCOL 3350 17 G PO PACK: 17.0000 g | PACK | Freq: Every day | ORAL | Status: DC | PRN

## 2017-06-26 MED ORDER — CLINDAMYCIN PHOSPHATE 900 MG/50ML IV SOLN: 900.0000 mg | INTRAVENOUS | Status: DC

## 2017-06-26 MED ORDER — ALUM & MAG HYDROXIDE-SIMETH 200-200-20 MG/5ML PO SUSP: 30.0000 mL | Freq: Four times a day (QID) | ORAL | Status: DC | PRN

## 2017-06-26 NOTE — Evaluation (Signed)
Occupational Therapy Evaluation Patient Details Name: [name] MRN: [id number] DOB: 03-27-1956 Today's Date: 06/26/2017    History of Present Illness Pt is a 61 y/o male s/p L5 -S1 BILATERAL T LIF (Bilateral). 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   Clinical Impression   PTA Pt independent in ADL and used RW due to pain occasionally. Pt is currently mod A for ADL and mod A for transfers with RW. Please see OT problem list below. Back handout provided and reviewed adls in detail. Pt educated on: clothing between brace, never sleep in brace (MD Ok'd ambulation without brace this session - education provided as precursor for Pt getting brace), set an alarm at night for medication, avoid sitting for long periods of time, correct bed positioning for sleeping, correct sequence for bed mobility, initiated AE education, avoiding lifting more than 5 pounds and never wash directly over incision. Pt will require skilled OT in the acute setting and SNF level therapy at discharge to maximize safety and independence in ADL and functional transfers as Pt lives alone. Next session to focus on brace education and AE for LB bathing/dressing practice.     Follow Up Recommendations  SNF    Equipment Recommendations  3 in 1 bedside commode;Other (comment) (defer to SNF)    Recommendations for Other Services       Precautions / Restrictions Precautions Precautions: Back Precaution Booklet Issued: Yes (comment) Precaution Comments: handout reviewed in full Restrictions Weight Bearing Restrictions: No      Mobility Bed Mobility Overal bed mobility: Needs Assistance Bed Mobility: Rolling;Sit to Sidelying Rolling: Min assist Sidelying to sit: Min assist     Sit to sidelying: Min assist General bed mobility comments: Min assist to help pt bring legs back into bed. Verbal cues for log roll technique.   Transfers Overall transfer level: Needs assistance Equipment used: Rolling walker (2 wheeled) Transfers: Sit to/from [employer] Sit to Stand: Mod assist;From elevated surface Stand pivot transfers: Min assist       General transfer comment: Mod assist from lower recliner chair. MD ok'd getting up without the brace.     Balance Overall balance assessment: Needs assistance Sitting-balance support: Feet supported;Bilateral upper extremity supported Sitting balance-Leahy Scale: Fair     Standing balance support: Bilateral upper extremity supported Standing balance-Leahy Scale: Fair                             ADL either performed or assessed with clinical judgement   ADL Overall ADL's : Needs assistance/impaired Eating/Feeding: Sitting;Set up   Grooming: Minimal assistance;Standing Grooming Details (indicate cue type and reason): min A for standing support Upper Body Bathing: Moderate assistance   Lower Body Bathing: Maximal assistance   Upper Body Dressing : Moderate assistance   Lower Body Dressing: Maximal assistance   Toilet Transfer: Stand-pivot;BSC;RW;Moderate assistance Toilet Transfer Details (indicate cue type and reason): simualted through recliner transfer [place] and Hygiene: Moderate assistance       Functional mobility during ADLs: Minimal assistance;Rolling walker;Cueing for safety General ADL Comments: initiated education for AE to assist with LB bathing/dressing     Vision Patient Visual Report: No change from baseline Vision Assessment?: No apparent visual deficits     Perception     Praxis  Pertinent Vitals/Pain Pain Assessment: Faces Faces Pain Scale: Hurts even more Pain Location: back and right leg Pain Descriptors / Indicators:  Aching;Burning Pain Intervention(s): Monitored during session;Repositioned     Hand Dominance Right   Extremity/Trunk Assessment Upper Extremity Assessment Upper Extremity Assessment: Overall WFL for tasks assessed   Lower Extremity Assessment Lower Extremity Assessment: Defer to PT evaluation RLE Deficits / Details: right leg with numbness and burning in WB, buckling with full weight.   RLE Sensation: decreased light touch   Cervical / Trunk Assessment Cervical / Trunk Assessment: Other exceptions Cervical / Trunk Exceptions: pt with h/o other lumbar and cervical surgery   Communication Communication Communication: No difficulties   Cognition Arousal/Alertness: Awake/alert Behavior During Therapy: WFL for tasks assessed/performed Overall Cognitive Status: Within Functional Limits for tasks assessed                                     General Comments       Exercises     Shoulder Instructions      Home Living Family/patient expects to be discharged to:: Private residence Living Arrangements: Alone Available Help at Discharge: Friend(s);Available PRN/intermittently Type of Home: House Home Access: Stairs to enter [employer] of Steps: 1 Entrance Stairs-Rails: None Home Layout: One level     Bathroom Shower/Tub: Teacher, early years/pre: Standard     Home Equipment: Environmental consultant - 2 wheels          Prior Functioning/Environment Level of Independence: Independent with assistive device(s)        Comments: Pt reports at times he would have to use RW on his "bad days"        OT Problem List: Decreased range of motion;Decreased activity tolerance;Impaired balance (sitting and/or standing);Decreased safety awareness;Decreased knowledge of use of DME or AE;Decreased knowledge of precautions;Pain      OT Treatment/Interventions: Self-care/ADL training;Therapeutic exercise;Energy conservation;DME and/or AE instruction;Therapeutic  activities;Patient/family education;Balance training    OT Goals(Current goals can be found in the care plan section) Acute Rehab OT Goals Patient Stated Goal: to get his right leg to feel better OT Goal Formulation: With patient Time For Goal Achievement: 07/10/17 Potential to Achieve Goals: Good ADL Goals Pt Will Perform Grooming: with modified independence;standing ( maintaining back precautions) Pt Will Perform Upper Body Bathing: with modified independence;sitting Pt Will Perform Lower Body Bathing: with modified independence;with adaptive equipment;sit to/from stand Pt Will Transfer to Toilet: with modified independence;ambulating;regular height toilet (with 3in1; with RW;  maintaining back precautions) Pt Will Perform Toileting - Clothing Manipulation and hygiene: with modified independence;sit to/from stand Additional ADL Goal #1: Pt will perform bed mobility at modified independent level maintaining precautions as precursor to ADL  OT Frequency: Min 2X/week   Barriers to D/C: Decreased caregiver support  Pt lives alone       Co-evaluation              AM-PAC PT "6 Clicks" Daily Activity     Outcome Measure Help from another person eating meals?: None Help from another person taking care of personal grooming?: A Little Help from another person toileting, which includes using toliet, bedpan, or urinal?: A Lot Help from another person bathing (including washing, rinsing, drying)?: A Lot Help from another person to put on and taking off regular upper body clothing?: A Lot Help from another person to put on and taking off regular lower body clothing?: A Lot  6 Click Score: 15   End of Session Equipment Utilized During Treatment: Gait belt;Rolling walker Nurse Communication: Mobility status (wants graham crackers and gingerale)  Activity Tolerance: Patient tolerated treatment well Patient left: in bed;with call bell/phone within reach;with bed alarm set;with nursing/sitter  in room;with SCD's reapplied  OT Visit Diagnosis: Unsteadiness on feet (R26.81);Other abnormalities of gait and mobility (R26.89);Muscle weakness (generalized) (M62.81);Pain Pain - Right/Left: Right Pain - part of body: Leg (back)                Time: 8110-3159 OT Time Calculation (min): 18 min Charges:  OT General Charges $OT Visit: 1 Procedure OT Evaluation $OT Eval Moderate Complexity: 1 Procedure G-Codes:    Hulda Humphrey OTR/L [place] 06/26/2017, 4:01 PM

## 2017-06-26 NOTE — Transfer of Care (Signed)
Immediate Anesthesia Transfer of Care Note  Patient: [name]  Procedure(s) Performed: Procedure(s): L5 -S1 BILATERAL T LIF (Bilateral)  Patient Location: PACU  Anesthesia Type:General  Level of Consciousness: awake, alert  and oriented  Airway & Oxygen Therapy: Patient Spontanous Breathing  Post-op Assessment: Report given to RN and Post -op Vital signs reviewed and stable  Post vital signs: Reviewed and stable  Last Vitals:  Vitals:   06/25/17 1726 06/25/17 2046  BP: 130/67 (!) 109/95  Pulse: 74 71  Resp: 18 18  Temp: 37 C 36.7 C    Last Pain:  Vitals:   06/25/17 2046  TempSrc: Oral  PainSc:       Patients Stated Pain Goal: 3 (42/59/56 3875)  Complications: No apparent anesthesia complications

## 2017-06-26 NOTE — Op Note (Signed)
06/24/2017 - 06/26/2017  2:31 AM  PATIENT:  [name]  61 y.o. male  MRN: [id number]  OPERATIVE REPORT  PRE-OPERATIVE DIAGNOSIS:  myelopathy  POST-OPERATIVE DIAGNOSIS:  myelopathy  PROCEDURE:  Procedure(s): L5 -S1 BILATERAL TLIF with pedicle screw placement at SI bilaterally fixing the SI level to the retained L2-L5 rods with a sleeve., Concorde lift cages 89m x 348m     SURGEON:  JaJessy OtoMD     ASSISTANT:  JaBenjiman CorePA-C  (Present throughout the entire procedure and necessary for completion of procedure in a timely manner)     ANESTHESIA:  General, supplemented with local marcaine 0.5% 1:1 exparel 1.3% total 30cc, Dr. EdOren Bracket  EBL: 500CC  DRAINS: Foley to SD    COMPLICATIONS:  None.     COMPONENTS: Implant Name Type Inv. Item Serial No. Manufacturer Lot No. LRB No. Used  CAGE CONCORDE LIFT 9X21MM - [id number] CAGE CONCORDE LIFT 9X21MM  [employer] [id number]  CAGE CONCORDE LIFT 9X21MM - [id number] CAGE CONCORDE LIFT 9X21MM  [id number]  BONE VIVIGEN FORMABLE 5.4CC - [id number] Implant BONE VIVIGEN FORMABLE 5.4CC  [facility]   1  CONNECTOR EXPEDIUM TI 55MM - [id number] CONNECTOR EXPEDIUM TI 55MM  [employer]   2  ROD EXPEDIUM PRE BENT 5.5X75 - [id number] ROD EXPEDIUM PRE BENT 5.5X75  [employer]   2  SCREW VIPER 7X45MM - [id number] SCREW VIPER 7X45MM  [employer]   2  SCREW SET SINGLE INNER - [id number] SCREW SET SINGLE INNER   [employer]     2     PROCEDURE: The patient was met in the holding area, and the appropriate lumbar levels right  L5-S1 identified and marked with an "X" and my initials. I had discussion with the patient in the preop holding area regarding a change of consent form.The fusion level was reidentified as bilateral L5-S1. Patient understands the rationale to perform TLIF at  bilateral L5-S1 level to decompress the bilateral L5-S1 lateral recess and foramenal stenosis and stabilize a spondylolisthesis L5-S1 below L2-L5 fusion. The patient was then transported to OR and was placed under general anestheticwithout difficulty. The patient received appropriate preoperative antibiotic prophylaxis Clindamycin 900 mg IV.  Nursing staff inserted a Foley catheter under sterile conditions. The patient was then turned to a prone position using the JaMinneiskapine frame. PAS. all pressure points well padded the arms at the side to 90 90. Standard prep with DuraPrep solution draped in the usual manner from the lower dorsal spine the mid sacral segment. Iodine Vi-Drape was used and the old incision scar was marked. Time-out procedure was called and correct. Skin in the midline between L2 and S3L4was then infiltrated with local anesthesia, marcaine 1/2% 1:1 exparel 1.3% total 20 cc used. Incision was then made  extending from L4-S2  through the skin and subcutaneous layers ellipsing the skin incision caudle, this was continued down to the patient's lumbodorsal fascia and spinous processes L4,L5 and S1. The incision then carried sharply excising the supraspinous ligament and then continuing the lateral aspect of the spinous processes of L4, L5 and S1. Cobb elevator used to carefully elevate the paralumbar muscles off of the posterior elements using electrocautery carefully drilled bleeding and perform dissection of the muscle tissues of the resecting the facet capsule at the L5-S1. Continuing  the exposure out laterally to expose the lateral margin of the facet joint line at L5-S1. Incision was carried in the midline down to the L5 level area bleeders controlled using electrocautery monopolar electrocautery. Retained pedicle screws and rods at L4 and L5 were identified. The pedicle screws location allowed for the use of connector sleeves to the old rod and screw construct L4 to L5. The posterior elements  exposed and debrided with leckell rounger and electrocautery.  C-arm fluoroscopy was then brought into the field and using C-arm fluoroscopy then a hole made into the inferomedial aspect of the pedicle of S1 using a high speed burr observed in the pedicle using C arm at the 5 oclock position on the right S1 pedicle nerve probe initial entry was determined on fluoroscopy to be good position alignment so that a 4.556m tap was passed to 45 mm within the right S1 pedicle to a depth of nearly 45 mm observed on C-arm fluoroscopy to be beyond the midpoint of the lumbar vertebra and then position alignment within the right S1 pedicle this was then removed and the pedicle channel probed demonstrating patency no sign of rupture the cortex of the pedicle. Tapping with a 4 mm screw tap then 5 mm tap then a 6 mm tap and then  a 7.0 mm x 45 mm screw was reserved for later placement on the right side pedicle at the S1 level after the TLIF is performed. C-arm fluoroscopy was then brought into the field and using C-arm fluoroscopy then a hole made into the posterior medial aspect of the pedicle of left S1 observed in the pedicle using ball tipped nerve hook and hockey stick nerve probe initial entry was determined on fluoroscopy to be good position alignment so that 4.060mtap was then used to tap the left S1 pedicle to a depth of nearly 45 mm observed on C-arm fluoroscopy to be beyond the midpoint of the lumbar vertebra and then position alignment within the left S1 pedicle this was then removed and the pedicle channel probed demonstrating patency no sign of rupture the cortex of the pedicle. Tapping with a 5 mm screw tap then tapping with a 6.0 mm tap, a 7.0 mm x 45 mm screw was then place and observed to be in good position and alignement of fluoro. C-arm fluoroscopy was then removed from the field.The rod area between the L4 and L5 pedicle screw fasteners was exposed and the L4-5 bilateral rod fastener sleeves were attached to  the rods with the torque screw driver.The upper spinous processes of  S1 and inferior 50% of L5 were then resected down to the base the lamina at each segment.  Leksell rongeur used to resect inferior aspect of the lamina on the both sides at the L5 level and the bilateral medial 40% of the facet of L5-S1 was resected in order to decompress the both sides of the lumbar thecal sac at L5-S1 and decompress the both lateral recesses of L5 and S1 neuroforamen. Osteotomes and 56m58mnd 3mm49mrrisons were used for this portion of the decompression.  Near complete facetectomy was perform on the both sides at L5-S1 to provide for exposure of the right and left sides L5-S1neuroforamen for ease of placement of TLIF (transforaminal lumbar interbody fusion) bilaterally, at the L5 level, the inferior portions of the lamina and pars were also resected first beginning with the Leksell rongeur and osteotomes and then resecting using 2 and 3 mm Kerrison. Continued laminectomy was carried out resecting  the central portions of the lamina of L5 and upper S1 performing foraminotomies on the right side and left sides at the L5 and S1 levels. The inferior articular process  L5 was resected on the both sides.  A large amount of hypertrophic ligmentum flavum was found impressing on the lateral recesses at L5-S1 and narrowing the respective L5 and S1 neuroforamen.  Loupe magnification and headlight were used during this portion procedure.  Attention then turned to placement of the right L5-S1 transforaminal lumbar interbody fusion cage.  Bleeding controlled using bipolar electrocautery thrombin soaked gel cottonoids. Then turned to the right L5-S1 level the exposure the posterior lateral aspect this was carried out using a Penfield 4 bipolar electrocautery to control small bleeders present. Derricho retractor used to retract the thecal sac and L5 nerve root a 15 blade scalpel was used to incise posterior lateral aspect of the right L5-S1 disc  the disc space at this level showed a rather severe narrowing posteriorly was more open anteriorly so that an osteotome again was used to resect a small portion the posterior inferior lip of the vertebral body at L5  in order to gain ease of access into the L5-S1 disc space. The space was debrided of degenerative disc material using pituitary along root the entire disc space was then debrided of degenerative disc material using pituitary rongeurs curettage down to bleeding bone endplates. 96mm and 9 mm shavers were used to debride the disc space and pituitary ronguers used to remove the loosened debris. This space was then carefully assess using spacers  a 10.34mm trial cage provided the best fit, the Depuy concorde lift bullet lordotic cage 44mm x 49mm was chosen so that the permanent 9.0 mm cage by 21 mm Lordotic concorde lift cage packed with local bone graft and vivigen II was placed into the intervertebral disc space. The posterior intervertebral disc space was then packed with autogenous local bone graft that been harvested from the central laminectomy and vivigen II allograft, allograft cancellous chips. Bleeding controlled using bipolar electrocautery.  Observed on C-arm fluoroscopy to be in good position alignment. The cage at L5-S1 was placed anteriorly as best as possible the correct patient's lordosis. The cage then expanded to 11 and then 12 mm. The inserting handle maintained while the central threaded part was removed and additional bone graft then passed down the central cannulated part of the insertion handle to fill the remaining unfilled parts of the expanded cage. Additional bone graft was placed along the lateral aspect of the  Cage within the disc space. The spinal canal and neuroforamen were then explored and debrided of any extra bone graft that was outside of the disc space.m This then completed the transforaminal lumbar interbody fusion portion of the case. Bleeders were controlled using  bipolar electrocautery thrombin-soaked Gelfoam were appropriate.Decortication of the left L5-S1 facet joint carried out. These were packed with cancellous local bone graft.  Attention then turned to placement of the left L5-S1 transforaminal lumbar interbody fusion cage.  Bleeding controlled using bipolar electrocautery thrombin soaked gel cottonoids. Then turned to the left L5-S1 level the exposure the posterior lateral aspect this was carried out using a Penfield 4 bipolar electrocautery to control small bleeders present. Derricho retractor used to retract the thecal sac and L5 nerve root a 15 blade scalpel was used to incise posterior lateral aspect of the left L5-S1 disc the disc space at this level showed a rather severe narrowing posteriorly was more open anteriorly so that an  osteotome again was used to resect a small portion the posterior inferior lip of the vertebral body at L5  in order to gain ease of access into the L5-S1 disc space. The space was debrided of degenerative disc material using pituitary along root the entire disc space was then debrided of degenerative disc material using pituitary rongeurs curettage down to bleeding bone endplates. 53m and 9 mm shavers were used to debride the disc space and pituitary ronguers used to remove the loosened debris. This space was then carefully assess using spacers  a 10.050mtrial cage provided the best fit, the Depuy concorde lift bullet lordotic cage 60m36m 57m57ms chosen so that the permanent 9.0 mm cage by 32 mm Lordotic concorde lift cage packed with local bone graft and vivigen II was placed into the intervertebral disc space. The posterior intervertebral disc space was then packed with autogenous local bone graft that been harvested from the central laminectomy and vivigen II allograft, allograft cancellous chips. Bleeding controlled using bipolar electrocautery.  Observed on C-arm fluoroscopy to be in good position alignment. The cage at L5-S1 was  placed anteriorly as best as possible the correct patient's lordosis. The cage then expanded to 11 and then 12 mm. The inserting handle maintained while the central threaded part was removed and additional bone graft then passed down the central cannulated part of the insertion handle to fill the remaining unfilled parts of the expanded cage. Additional bone graft was placed along the lateral aspect of the  Cage within the disc space. The spinal canal and neuroforamen were then explored and debrided of any extra bone graft that was outside of the disc space.m This then completed the transforaminal lumbar interbody fusion portion of the case. Bleeders were controlled using bipolar electrocautery thrombin-soaked Gelfoam were appropriate.Decortication of the left L5-S1 facet joint carried out. These were packed with cancellous local bone graft.  The 2 viper corticofixation screws on the right and left S1 were each placed and then each fastener carefully aligned  to allow for placement of rods. The right side first quarter inch titanium rod was then carefully contoured using the french benders and a precontoured 75 mm rod. This was then placed into the pedicle screws on the right extending from L4-5 rod sleeve to the fastener at S1 each of the caps were carefully placed loosely tightened. Attention turned to the left side were similarly and then screws were carefully adjusted to allow for a better pattern screws to allow for placement of fixation of the rod a quarter inch 75 mm precontoured titanium rod was then carefully contoured. This was able to be inserted into the left pedicle screw fastener at S1 and into the sleeve at L4-5, Caps onto the L4-5 fasteners were tightened to 80 foot lbs. Across the right side  L4-5 to S1 screw fasteners compression was obtained on the right side between the sleeve and the fastener at S1 compressing between the fasteners and tightening the screw caps 85 pounds. Similarly this was  done on the left side at L4-5 sleeve to the left S1 pedicle screw fastener obtaining compression and tightened 85 pounds. Irrigation was carried out with copious amounts of saline solution this was done throughout the case. Cell Saver was used during the case but due to some fragmentation of the titanium rods while dividing earlier in the case the cell saver blood was not returned to the paited. Hockey stick neuroprobe was used to probe the neuroforamen bilateral L5 and S1  these were determined to be well decompressed. Irrigation was carried out with copious amounts of saline solution this was done throughout the case. Hockey stick neuroprobe was used to probe the neuroforamen bilateral L5 and S1 these were determined to be well decompressed. Permanent C-arm images were obtained in AP and lateral plane and oblique planes. Remaining local bone graft was then applied along the both lateral posterior lateral regions extending from L5 to S1 facet beds.Gelfoam was then removed spinal canal. The lumbodorsal musculature carefully exam debrided of any devitalized tissue following removal of self retaining retractors were the bleeders were controlled using electrocautery and the area dorsal lumbar muscle were then approximated in the midline with interrupted #1 Vicryl sutures loose the dorsal fascia was reattached to the spinous process of L4  superiorly and S2  inferiorly this was done with #1 Vicryl sutures. Subcutaneous layers then approximated using interrupted 0 Vicryl sutures and 2-0 Vicryl sutures. Skin was closed with a running subcutaneous stitch of 4-0 Vicryl Dermabond was applied then MedPlex bandage. All instrument and sponge counts were correct. The patient was then returned to a supine position on her bed reactivated extubated and returned to the recovery room in satisfactory condition.   Rodell Perna, MD perform the duties of assistant surgeon during this case. He was present from the beginning of the case to  the end of the case assisting in transfer the patient from her stretcher to the OR table and back to the stretcher at the end of the case. Assisted in careful retraction and suction of the laminectomy site delicate neural structures operating under the operating room microscope. He performed portions of the foramenal decompression bilaterally at L5 and at S1. His assistance was necessary to perform this surgery in a timely fashion.    [name]  06/26/2017, 2:31 AM

## 2017-06-26 NOTE — Clinical Social Work Note (Signed)
Clinical Social Work Assessment  Patient Details  Name: [name] MRN: [id number] Date of Birth: [birth year]  Date of referral:  06/26/17               Reason for consult:  Discharge Planning, Facility Placement, Mental Health Concerns                Permission sought to share information with:  Family Supports Permission granted to share information::  Yes, Verbal Permission Granted  Name::     Fredrich Birks  Agency::  snf  Relationship::  [phone]  Contact Information:  friend  Housing/Transportation Living arrangements for the past 2 months:  Apartment Source of Information:  Patient Patient Interpreter Needed:  None Criminal Activity/Legal Involvement Pertinent to Current Situation/Hospitalization:  No - Comment as needed Significant Relationships:  Other Family Members, Siblings Lives with:  Self Do you feel safe going back to the place where you live?  No Need for family participation in patient care:  Yes (Comment)  Care giving concerns: No family at bedside. Patient stated he has family in the area and they are supportive   Social Worker assessment / plan:  Clinical Social Worker met patient at bedside to offer support and discuss patient disposition needs. Patient stated he is doing well after surgery. Patient stated he lives at home by himself but does have support from siblings. Patient stated he is agreeable to discharge to SNF and would like to be referred out to facility in the [place] area. CSW to complete necessary paperwork and initiate SNF search on patient behalf. CSW to follow up with patient once bed offers are available. CSW remains available for support and to facilitate patient discharge needs once medically ready.  Employment status:  Retired Forensic scientist:  [employer] PT Recommendations:  [place] / Referral to community resources:  [place]  Patient/Family's Response to care:  Patient is very  Patent attorney of CSW role in patients care Patient/Family's Understanding of and Emotional Response to Diagnosis, Current Treatment, and Prognosis:  Patient is agreeable to discharge to snf  Emotional Assessment Appearance:  Appears stated age Attitude/Demeanor/Rapport:  Other Affect (typically observed):  Pleasant Orientation:  Oriented to Place, Oriented to  Time, Oriented to Situation, Oriented to Self Alcohol / Substance use:  Not Applicable, Tobacco Use Psych involvement (Current and /or in the community):  No (Comment) (pt has psych history )  Discharge Needs  Concerns to be addressed:  No discharge needs identified Readmission within the last 30 days:  No Current discharge risk:  None Barriers to Discharge:  No Barriers Identified   Wende Neighbors, LCSW 06/26/2017, 3:00 PM

## 2017-06-26 NOTE — Progress Notes (Signed)
Orthopedic Tech Progress Note Patient Details:  [name] [birth year] [id number]  Patient ID: [name], male   DOB: Apr 19, 1956, 61 y.o.   MRN: [id number]   [name] 06/26/2017, 11:55 AMCalled [employer] for [employer].

## 2017-06-26 NOTE — Discharge Instructions (Signed)

## 2017-06-26 NOTE — Brief Op Note (Signed)
06/24/2017 - 06/26/2017  2:08 AM  PATIENT:  [name]  61 y.o. male  PRE-OPERATIVE DIAGNOSIS:  myelopathy  POST-OPERATIVE DIAGNOSIS:  myelopathy  PROCEDURE:  Procedure(s): L5 -S1 BILATERAL T LIF (Bilateral)  SURGEON:  Surgeon(s) and Role:    [name], MD - Primary    Marybelle Killings, MD - Assisting  PHYSICIAN ASSISTANT:   ASSISTANTS: Dr. Rodell Perna, MD   ANESTHESIA:   local and general Dr. Oren Bracket.  EBL:  Total I/O In: 2200 [I.V.:2200] Out: 1360 [Urine:860; Blood:500]  BLOOD ADMINISTERED:none  DRAINS: Urinary Catheter (Foley)   LOCAL MEDICATIONS USED:  MARCAINE 0.5% 1:1 EXPAREL 1.3% Amount: 35ml  SPECIMEN:  No Specimen  DISPOSITION OF SPECIMEN:  N/A  COUNTS:  YES  TOURNIQUET:  * No tourniquets in log *  DICTATION: .Dragon Dictation  PLAN OF CARE: Admit to inpatient   PATIENT DISPOSITION:  PACU - hemodynamically stable.   Delay start of Pharmacological VTE agent (>24hrs) due to surgical blood loss or risk of bleeding: yes

## 2017-06-26 NOTE — Progress Notes (Addendum)
     Subjective: 1 Day Post-Op Procedure(s) (LRB): L5 -S1 BILATERAL T LIF (Bilateral) Awake,alert and oriented x 4. Foley discontinued and able to void. Tolerating diet, and po narcotics.  Pharmacy indicates that a more recent EKG is in order as this patient is on several medications that can alter cardiac rythym Patient reports pain as moderate.    Objective:   VITALS:  Temp:  [97.7 F (36.5 C)-99 F (37.2 C)] 99 F (37.2 C) (07/28 0936) Pulse Rate:  [71-138] 109 (07/28 0936) Resp:  [16-20] 20 (07/28 0936) BP: (106-151)/(58-95) 106/58 (07/28 0936) SpO2:  [92 %-100 %] 92 % (07/28 0936)  Neurologically intact ABD soft Neurovascular intact Sensation intact distally Intact pulses distally Dorsiflexion/Plantar flexion intact Incision: scant drainage   LABS  Recent Labs  06/24/17 1920 06/26/17 0439  HGB 13.2 11.3*  WBC 8.6 11.3*  PLT 244 226    Recent Labs  06/24/17 1920 06/26/17 0439  NA 141 137  K 3.8 4.0  CL 103 104  CO2 29 28  BUN 8 12  CREATININE 0.84 0.96  GLUCOSE 119* 215*   No results for input(s): LABPT, INR in the last 72 hours.   Assessment/Plan: 1 Day Post-Op Procedure(s) (LRB): L5 -S1 BILATERAL T LIF (Bilateral)  Advance diet Up with therapy Discharge to SNF, may need PASAR, social service to assist. OK to get up and walking if brace is not going to be available for a couple of days, mobilize. Consult diabetes coordinator to assist with perioperative diabetes management. EKG ordered.  [name] 06/26/2017, 12:47 PM Patient ID: [name], male   DOB: 10-01-1956, 61 y.o.   MRN: [id number]

## 2017-06-26 NOTE — Evaluation (Signed)
Physical Therapy Evaluation Patient Details Name: [name] MRN: [id number] DOB: 08-07-1956 Today's Date: 06/26/2017   History of Present Illness  Pt is a 61 y/o male s/p L5 -S1 BILATERAL T LIF (Bilateral). 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  Clinical Impression  Pt was able to get OOB to chair and walk a short distance around his room with RW.  Right leg is painful and weak in standing and with gait, pt relying heavily on RW for support.  Pt is planning SNF for rehab at discharge.   PT to follow acutely for deficits listed below.       Follow Up Recommendations SNF    Equipment Recommendations  3in1 (PT)    Recommendations for Other Services   NA    Precautions / Restrictions Precautions Precautions: Back Precaution Booklet Issued: No Precaution Comments: verbally reviewed spinal precautions, log roll technique.       Mobility  Bed Mobility Overal bed mobility: Needs Assistance Bed Mobility: Rolling;Sidelying to Sit Rolling: Min assist Sidelying to sit: Min assist       General bed mobility comments: Min assist to help pt flex knees and bring his trunk all the way over to side lying.  Assist needed to support trunk to get to sitting EOB.  Verbal cues for log roll technique.   Transfers Overall transfer level: Needs assistance Equipment used: Rolling walker (2 wheeled) Transfers: Sit to/from Stand Sit to Stand: Min assist;Mod assist;From elevated surface         General transfer comment: Min assist from elevated bed, mod assist from lower recliner chair.  Sit to stand x 4 due to [place] from [employer] here to measure him for his brace.  MD ok'd getting up without the brace.   Ambulation/Gait Ambulation/Gait assistance:  Min assist Ambulation Distance (Feet): 20 Feet Assistive device: Rolling walker (2 wheeled) Gait Pattern/deviations: Step-to pattern;Antalgic Gait velocity: decreased   General Gait Details: decreased WB on right leg due to pain and burning.           Balance Overall balance assessment: Needs assistance Sitting-balance support: Feet supported;Bilateral upper extremity supported Sitting balance-Leahy Scale: Fair     Standing balance support: Bilateral upper extremity supported Standing balance-Leahy Scale: Fair                               Pertinent Vitals/Pain Pain Assessment: Faces Faces Pain Scale: Hurts even more Pain Location: back and right leg Pain Descriptors / Indicators: Aching;Burning Pain Intervention(s): Limited activity within patient's tolerance;Monitored during session;Repositioned    Home Living Family/patient expects to be discharged to:: Private residence Living Arrangements: Alone Available Help at Discharge: Friend(s);Available PRN/intermittently Type of Home: House Home Access: Stairs to enter Entrance Stairs-Rails: None Entrance Stairs-Number of Steps: 1 Home Layout: One level Home Equipment: Walker - 2 wheels      Prior Function Level of Independence: Independent with assistive device(s)         Comments: Pt reports at times he would have to use RW on his "bad days"     Hand Dominance   Dominant Hand: Right    Extremity/Trunk Assessment   Upper Extremity Assessment Upper Extremity Assessment: Defer to OT evaluation    Lower Extremity Assessment Lower Extremity Assessment: RLE deficits/detail RLE  Deficits / Details: right leg with numbness and burning in WB, buckling with full weight.   RLE Sensation: decreased light touch    Cervical / Trunk Assessment Cervical / Trunk Assessment: Other exceptions Cervical / Trunk Exceptions: pt with h/o other lumbar and cervical surgery  Communication   Communication: No  difficulties  Cognition Arousal/Alertness: Awake/alert Behavior During Therapy: WFL for tasks assessed/performed Overall Cognitive Status: Within Functional Limits for tasks assessed                                               Assessment/Plan    PT Assessment Patient needs continued PT services  PT Problem List Decreased strength;Decreased activity tolerance;Decreased balance;Decreased mobility;Decreased knowledge of use of DME;Decreased knowledge of precautions;Pain;Impaired sensation       PT Treatment Interventions DME instruction;Gait training;Stair training;Functional mobility training;Therapeutic activities;Therapeutic exercise;Balance training;Patient/family education;Neuromuscular re-education    PT Goals (Current goals can be found in the Care Plan section)  Acute Rehab PT Goals Patient Stated Goal: to get his right leg to feel better PT Goal Formulation: With patient Time For Goal Achievement: 07/03/17 Potential to Achieve Goals: Good    Frequency Min 5X/week           AM-PAC PT "6 Clicks" Daily Activity  Outcome Measure Difficulty turning over in bed (including adjusting bedclothes, sheets and blankets)?: Total Difficulty moving from lying on back to sitting on the side of the bed? : Total Difficulty sitting down on and standing up from a chair with arms (e.g., wheelchair, bedside commode, etc,.)?: Total Help needed moving to and from a bed to chair (including a wheelchair)?: A Little Help needed walking in hospital room?: A Little Help needed climbing 3-5 steps with a railing? : A Lot 6 Click Score: 11    End of Session Equipment Utilized During Treatment: Gait belt Activity Tolerance: Patient limited by pain Patient left: in chair;with call bell/phone within reach Nurse Communication: Mobility status PT Visit Diagnosis: Muscle weakness (generalized) (M62.81);Difficulty in walking, not elsewhere classified (R26.2);Pain Pain - Right/Left:  Right Pain - part of body: Leg (and back)    Time: 6606-3016 PT Time Calculation (min) (ACUTE ONLY): 39 min   Charges:        Wells Guiles B. Madilynne Mullan, PT, DPT [phone]   PT Evaluation $PT Eval Moderate Complexity: 1 Procedure PT Treatments $Gait Training: 8-22 mins $Therapeutic Activity: 8-22 mins   06/26/2017, 1:50 PM

## 2017-06-26 NOTE — Progress Notes (Signed)
Patient returned from surgery alert oriented vitals ok pulse is coming down. Pain in lower mid back and left leg which was a pre surgery complaint, will continue to monitor.

## 2017-06-27 LAB — BASIC METABOLIC PANEL
ANION GAP: 5 (ref 5–15)
BUN: 12 mg/dL (ref 6–20)
CALCIUM: 8.6 mg/dL — AB (ref 8.9–10.3)
CO2: 27 mmol/L (ref 22–32)
Chloride: 106 mmol/L (ref 101–111)
Creatinine, Ser: 1.07 mg/dL (ref 0.61–1.24)
GFR calc Af Amer: >60 mL/min (ref >60)
GLUCOSE: 239 mg/dL — AB (ref 65–99)
Potassium: 3.8 mmol/L (ref 3.5–5.1)
SODIUM: 138 mmol/L (ref 135–145)

## 2017-06-27 LAB — GLUCOSE, CAPILLARY
GLUCOSE-CAPILLARY: 183 mg/dL — AB (ref 65–99)
GLUCOSE-CAPILLARY: 244 mg/dL — AB (ref 65–99)
Glucose-Capillary: 241 mg/dL — ABNORMAL HIGH (ref 65–99)
Glucose-Capillary: 159 mg/dL — ABNORMAL HIGH (ref 65–99)

## 2017-06-27 LAB — CBC
HCT: 28.1 % — ABNORMAL LOW (ref 39.0–52.0)
Hemoglobin: 9.4 g/dL — ABNORMAL LOW (ref 13.0–17.0)
MCH: 30.8 pg (ref 26.0–34.0)
MCHC: 33.5 g/dL (ref 30.0–36.0)
MCV: 92.1 fL (ref 78.0–100.0)
PLATELETS: 183 10*3/uL (ref 150–400)
RBC: 3.05 MIL/uL — ABNORMAL LOW (ref 4.22–5.81)
RDW: 11.5 % (ref 11.5–15.5)
WBC: 12.4 10*3/uL — AB (ref 4.0–10.5)

## 2017-06-27 LAB — HEMOGLOBIN A1C
HEMOGLOBIN A1C: 5.5 % (ref 4.8–5.6)
MEAN PLASMA GLUCOSE: 111 mg/dL

## 2017-06-27 MED ORDER — HYDROCODONE-ACETAMINOPHEN 7.5-325 MG PO TABS
1.0000 | ORAL_TABLET | Freq: Three times a day (TID) | ORAL | Status: DC
  Administered 2017-06-27 – 2017-06-28 (×2): 1 via ORAL
  Filled 2017-06-27 (×2): qty 1

## 2017-06-27 NOTE — Progress Notes (Signed)
Physical Therapy Treatment Patient Details Name: [name] MRN: [id number] DOB: Jun 11, 1956 Today's Date: 06/27/2017    History of Present Illness Pt is a 61 y/o male s/p L5 -S1 BILATERAL T LIF (Bilateral). 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    PT Comments    Progressing with activity and mobility. Decreased recall of precautions but good overall technique with cues for mobility and safety. Patient ambulating with use of RW. Cues for posture and positioning during transfers. Continue to await arrival of brace. Will continue to see and progress as tolerated.    Follow Up Recommendations  SNF     Equipment Recommendations  3in1 (PT)    Recommendations for Other Services       Precautions / Restrictions Precautions Precautions: Back Precaution Booklet Issued: No Precaution Comments: verbally reviewed spinal precautions, log roll technique.  Restrictions Weight Bearing Restrictions: No    Mobility  Bed Mobility Overal bed mobility: Needs Assistance Bed Mobility: Rolling;Sidelying to Sit Rolling: Min guard Sidelying to sit: Min guard       General bed mobility comments: Min guard to come to upright at EOB for safety, VCs for technique   Transfers Overall transfer level: Needs assistance Equipment used: Rolling walker (2 wheeled) Transfers: Sit to/from Stand Sit to Stand: Min assist         General transfer comment: min assist to power up from bed and from toilet  Ambulation/Gait Ambulation/Gait assistance: Min guard Ambulation Distance (Feet): 90 Feet Assistive device: Rolling walker (2 wheeled) Gait Pattern/deviations: Step-to pattern;Antalgic Gait velocity: decreased   General Gait Details: Vcs for upright  posture and positioning with RW   Stairs            Wheelchair Mobility    Modified Rankin (Stroke Patients Only)       Balance Overall balance assessment: Needs assistance Sitting-balance support: Feet supported;Bilateral upper extremity supported Sitting balance-Leahy Scale: Fair     Standing balance support: Bilateral upper extremity supported Standing balance-Leahy Scale: Fair                              Cognition Arousal/Alertness: Awake/alert Behavior During Therapy: Flat affect Overall Cognitive Status: Within Functional Limits for tasks assessed                                        Exercises      General Comments        Pertinent Vitals/Pain Pain Assessment: No/denies pain    Home Living                      Prior Function            PT Goals (current goals can now be found in the care plan section) Acute Rehab PT Goals Patient Stated Goal: to get his right leg to feel better PT Goal Formulation: With patient Time For Goal Achievement: 07/03/17 Potential to Achieve Goals: Good Progress towards PT goals: Progressing toward goals    Frequency    Min 5X/week      PT Plan Current plan remains appropriate    Co-evaluation  AM-PAC PT "6 Clicks" Daily Activity  Outcome Measure  Difficulty turning over in bed (including adjusting bedclothes, sheets and blankets)?: Total Difficulty moving from lying on back to sitting on the side of the bed? : Total Difficulty sitting down on and standing up from a chair with arms (e.g., wheelchair, bedside commode, etc,.)?: Total Help needed moving to and from a bed to chair (including a wheelchair)?: A Little Help needed walking in hospital room?: A Little Help needed climbing 3-5 steps with a railing? : A Little 6 Click Score: 12    End of Session Equipment Utilized During Treatment: Gait belt Activity Tolerance: Patient limited by  pain Patient left: in chair;with call bell/phone within reach Nurse Communication: Mobility status PT Visit Diagnosis: Muscle weakness (generalized) (M62.81);Difficulty in walking, not elsewhere classified (R26.2);Pain Pain - Right/Left: Right Pain - part of body: Leg (and back)     Time: 1561-5379 PT Time Calculation (min) (ACUTE ONLY): 19 min  Charges:  $Gait Training: 8-22 mins                    G Codes:       Alben Deeds, PT DPT  Board Certified Neurologic Specialist [place] 06/27/2017, 2:14 PM

## 2017-06-27 NOTE — Progress Notes (Signed)
Patient sitting up in bedside chair.  Alert, verbally pleasant.  No complaints of pain or discomfort.  Honey comb dressing clean, dry, intact with minimal serosanguinous drainage.  Call light within reach.

## 2017-06-27 NOTE — Progress Notes (Signed)
Pt dressing changed X 2 overnight with serosanguinous drainage. Honeycomb dressing applied at 0200. Incision margins approximated. Ice applied and drainage stopped. Dressing clean, dry, intact @ 0600. BP trended soft at 0200 and NS was restarted continuously @ 100 mL/hr starting at 0230. Pain controlled overnight with PRN and scheduled pain meds. Pt did not ambulate d/t drowsiness. Will continue to monitor.

## 2017-06-27 NOTE — Progress Notes (Signed)
Pt dressing changed with moderate sanguinous drainage. BP 105/51, pulse 111. Started NS @ 100 mL/hr. Temp 98.1 Axillary. Will continue to monitor.

## 2017-06-27 NOTE — Progress Notes (Signed)
Pt dressing changed at 2000 on 7/28. Dressing saturated with serosanguinous fluid. Replaced with honeycomb dressing. Will continue to monitor.

## 2017-06-27 NOTE — Progress Notes (Signed)
Inpatient Diabetes Program Recommendations  AACE/ADA: New Consensus Statement on Inpatient Glycemic Control (2015)  Target Ranges:  Prepandial:   less than 140 mg/dL      Peak postprandial:   less than 180 mg/dL (1-2 hours)      Critically ill patients:  140 - 180 mg/dL   Lab Results  Component Value Date   GLUCAP 159 (H) 06/27/2017   HGBA1C 5.5 06/26/2017    Review of Glycemic Control  Diabetes history: DM2 Outpatient Diabetes medications: metformin 500 mg QD, januvia 100 mg QD Current orders for Inpatient glycemic control: Novolog 0-15 units tidwc  HgbA1C of 5.5% indicates excellent glycemic control at home.  Inpatient Diabetes Program Recommendations:     Agree with current orders.  Will follow while inpatient.  Thank you. Lorenda Peck, RD, LDN, CDE Inpatient Diabetes Coordinator [phone]

## 2017-06-27 NOTE — Anesthesia Postprocedure Evaluation (Signed)
Anesthesia Post Note  Patient: [name]  Procedure(s) Performed: Procedure(s) (LRB): L5 -S1 BILATERAL T LIF (Bilateral)     Patient location during evaluation: PACU Anesthesia Type: General Level of consciousness: awake and alert Pain management: pain level controlled Vital Signs Assessment: post-procedure vital signs reviewed and stable Respiratory status: spontaneous breathing, nonlabored ventilation and respiratory function stable Cardiovascular status: blood pressure returned to baseline and stable Postop Assessment: no signs of nausea or vomiting Anesthetic complications: no    Last Vitals:  Vitals:   06/27/17 0442 06/27/17 0500  BP: (!) 117/56 129/65  Pulse:  94  Resp:  16  Temp:  36.6 C    Last Pain:  Vitals:   06/27/17 0500  TempSrc: Oral  PainSc:                  Marlei Glomski,W. EDMOND

## 2017-06-27 NOTE — Progress Notes (Addendum)
Subjective: Pt stable - pain ok - eating lunch  Objective: Vital signs in last 24 hours: Temp:  [97.9 F (36.6 C)-99.9 F (37.7 C)] 97.9 F (36.6 C) (07/29 0500) Pulse Rate:  [91-123] 94 (07/29 0500) Resp:  [16-20] 16 (07/29 0500) BP: (102-143)/(51-90) 129/65 (07/29 0500) SpO2:  [92 %-96 %] 96 % (07/29 0500)  Intake/Output from previous day: 07/28 0701 - 07/29 0700 In: 600 [P.O.:600] Out: 1250 [Urine:1250] Intake/Output this shift: No intake/output data recorded.  Exam:  Ankle df pf ok bil  Labs:  Recent Labs  06/24/17 1920 06/26/17 0439 06/27/17 0339  HGB 13.2 11.3* 9.4*    Recent Labs  06/26/17 0439 06/27/17 0339  WBC 11.3* 12.4*  RBC 3.65* 3.05*  HCT 33.6* 28.1*  PLT 226 183    Recent Labs  06/26/17 0439 06/27/17 0339  NA 137 138  K 4.0 3.8  CL 104 106  CO2 28 27  BUN 12 12  CREATININE 0.96 1.07  GLUCOSE 215* 239*  CALCIUM 8.8* 8.6*   No results for input(s): LABPT, INR in the last 72 hours.  Assessment/Plan: hgb ok - eating - has required 2 dressing changes - f/u am with dr Rondel Jumbo 06/27/2017, 7:20 AM

## 2017-06-28 ENCOUNTER — Inpatient Hospital Stay (HOSPITAL_COMMUNITY): Payer: Medicare Other

## 2017-06-28 LAB — COMPREHENSIVE METABOLIC PANEL
ALBUMIN: 2.8 g/dL — AB (ref 3.5–5.0)
ALK PHOS: 66 U/L (ref 38–126)
ALT: 15 U/L — AB (ref 17–63)
ANION GAP: 6 (ref 5–15)
AST: 18 U/L (ref 15–41)
BILIRUBIN TOTAL: 0.4 mg/dL (ref 0.3–1.2)
BUN: 5 mg/dL — ABNORMAL LOW (ref 6–20)
CALCIUM: 8.8 mg/dL — AB (ref 8.9–10.3)
CO2: 26 mmol/L (ref 22–32)
CREATININE: 0.86 mg/dL (ref 0.61–1.24)
Chloride: 109 mmol/L (ref 101–111)
GFR calc Af Amer: >60 mL/min (ref >60)
GFR calc non Af Amer: >60 mL/min (ref >60)
GLUCOSE: 185 mg/dL — AB (ref 65–99)
Potassium: 3.9 mmol/L (ref 3.5–5.1)
Sodium: 141 mmol/L (ref 135–145)
TOTAL PROTEIN: 6 g/dL — AB (ref 6.5–8.1)

## 2017-06-28 LAB — CBC
HEMATOCRIT: 26.7 % — AB (ref 39.0–52.0)
Hemoglobin: 9 g/dL — ABNORMAL LOW (ref 13.0–17.0)
MCH: 31 pg (ref 26.0–34.0)
MCHC: 33.7 g/dL (ref 30.0–36.0)
MCV: 92.1 fL (ref 78.0–100.0)
Platelets: 193 10*3/uL (ref 150–400)
RBC: 2.9 MIL/uL — AB (ref 4.22–5.81)
RDW: 11.3 % — ABNORMAL LOW (ref 11.5–15.5)
WBC: 10.6 10*3/uL — ABNORMAL HIGH (ref 4.0–10.5)

## 2017-06-28 LAB — GLUCOSE, CAPILLARY
GLUCOSE-CAPILLARY: 214 mg/dL — AB (ref 65–99)
Glucose-Capillary: 125 mg/dL — ABNORMAL HIGH (ref 65–99)
Glucose-Capillary: 214 mg/dL — ABNORMAL HIGH (ref 65–99)
Glucose-Capillary: 176 mg/dL — ABNORMAL HIGH (ref 65–99)
Glucose-Capillary: 199 mg/dL — ABNORMAL HIGH (ref 65–99)

## 2017-06-28 MED ORDER — MAGNESIUM CITRATE PO SOLN
0.5000 | Freq: Once | ORAL | Status: AC
Start: 2017-06-28 — End: 2017-06-28
  Administered 2017-06-28: 0.5 via ORAL
  Filled 2017-06-28: qty 296

## 2017-06-28 MED ORDER — BISACODYL 10 MG RE SUPP
10.0000 mg | Freq: Once | RECTAL | Status: AC
  Administered 2017-06-28: 10 mg via RECTAL
  Filled 2017-06-28: qty 1

## 2017-06-28 NOTE — Progress Notes (Signed)
Pt blood pressure trended down since 0530. Pain med given at 0545. Pt lethargic but opens eyes to voice and oriented to name. Called [employer]. Was told by on call MD that pt's MD would make his rounds shortly.  Honey comb dressing changed X 1 at beginning of shift with serosanguinous drainage. Dressing clean, dry, intact at end of shift. Pt urinating 1000 mL X 2 during shift. Pt walked to chair and back to bed. Refused ambulation in hallway until "morning".  Will continue to monitor.

## 2017-06-28 NOTE — Progress Notes (Signed)
Patient ID: [name], male   DOB: 1956/01/26, 61 y.o.   MRN: [id number]  Reviewed abd xray and report read  IMPRESSION: Increased colonic stool burden consistent with constipation. No acute intra-abdominal abnormality is observed.  Ordered mag citrate 1/2 bottle and dulcolax suppository. BP good.

## 2017-06-28 NOTE — Progress Notes (Signed)
Occupational Therapy Treatment Patient Details Name: [name] MRN: [id number] DOB: [birth year] Today's Date: 06/28/2017    History of present illness Pt is a 61 y/o male s/p L5 -S1 BILATERAL T LIF (Bilateral). 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   OT comments  Pt making progress with ADL and mobility. Pt used brace during session today. Pt does not have 24/7 S available after DC and will benefit from rehab at SNF to reach a modified independent level to facilitate safe DC home. Will continue to follow acutely to address established goals and facilitate DC to SNF for rehab.    Follow Up Recommendations  SNF    Equipment Recommendations  3 in 1 bedside commode;Other (comment)    Recommendations for Other Services      Precautions / Restrictions Precautions Precautions: Back Precaution Booklet Issued: Yes (comment) Required Braces or Orthoses: Spinal Brace Spinal Brace: Thoracolumbosacral orthotic       Mobility Bed Mobility               General bed mobility comments: OOB in chair  Transfers Overall transfer level: Needs assistance Equipment used: Rolling walker (2 wheeled) Transfers: Sit to/from Stand Sit to Stand: Min guard         General transfer comment: VCs for hand placement and use of RW    Balance                                           ADL either performed or assessed with clinical judgement   ADL Overall ADL's : Needs assistance/impaired     Grooming: Supervision/safety;Standing   Upper Body Bathing: Minimal assistance;Sitting   Lower Body Bathing: Moderate assistance;Sit to/from stand   Upper Body Dressing : Moderate assistance Upper Body Dressing Details (indicate cue  type and reason): to donn TLSO Lower Body Dressing: Maximal assistance;Sit to/from stand       Toileting- Water quality scientist and Hygiene: Moderate assistance Toileting - Clothing Manipulation Details (indicate cue type and reason): Began educating pt on use of AE for hygiene after toileting     Functional mobility during ADLs: Minimal assistance;Rolling walker;Cueing for safety;Cueing for sequencing General ADL Comments: educated pt on back precautions for ADL     Vision       Perception     Praxis      Cognition Arousal/Alertness: Awake/alert Behavior During Therapy: Flat affect Overall Cognitive Status: Within Functional Limits for tasks assessed                                          Exercises     Shoulder Instructions       General Comments      Pertinent Vitals/ Pain       Pain Assessment: 0-10 Pain Score: 7  Pain Location: back and right leg Pain Descriptors / Indicators: Aching;Burning Pain Intervention(s): Limited activity within patient's tolerance  Home Living  Prior Functioning/Environment              Frequency  Min 2X/week        Progress Toward Goals  OT Goals(current goals can now be found in the care plan section)  Progress towards OT goals: Progressing toward goals  Acute Rehab OT Goals Patient Stated Goal: to be independent again OT Goal Formulation: With patient Time For Goal Achievement: 07/10/17 Potential to Achieve Goals: Good ADL Goals Pt Will Perform Grooming: with modified independence;standing Pt Will Perform Upper Body Bathing: with modified independence;sitting Pt Will Perform Lower Body Bathing: with modified independence;with adaptive equipment;sit to/from stand Pt Will Transfer to Toilet: with modified independence;ambulating;regular height toilet Pt Will Perform Toileting - Clothing Manipulation and hygiene: with modified  independence;sit to/from stand Additional ADL Goal #1: Pt will perform bed mobility at modified independent level maintaining precautions as precursor to ADL  Plan Discharge plan remains appropriate    Co-evaluation                 AM-PAC PT "6 Clicks" Daily Activity     Outcome Measure   Help from another person eating meals?: None Help from another person taking care of personal grooming?: A Little Help from another person toileting, which includes using toliet, bedpan, or urinal?: A Lot Help from another person bathing (including washing, rinsing, drying)?: A Lot Help from another person to put on and taking off regular upper body clothing?: A Lot Help from another person to put on and taking off regular lower body clothing?: A Lot 6 Click Score: 15    End of Session Equipment Utilized During Treatment: Gait belt;Rolling walker;Back brace  OT Visit Diagnosis: Unsteadiness on feet (R26.81);Other abnormalities of gait and mobility (R26.89);Muscle weakness (generalized) (M62.81);Pain Pain - Right/Left: Right Pain - part of body: Leg   Activity Tolerance Patient tolerated treatment well   Patient Left in chair;with call bell/phone within reach;with chair alarm set   Nurse Communication Patient requests pain meds        Time: 4496-7591 OT Time Calculation (min): 24 min  Charges: OT General Charges $OT Visit: 1 Procedure OT Treatments $Self Care/Home Management : 23-37 mins  [facility] [address], OT/L  [phone] 06/28/2017   Kosta Schnitzler,HILLARY 06/28/2017, 4:57 PM

## 2017-06-28 NOTE — Progress Notes (Signed)
Subjective: Patient very drowsy this morning. Since a preoperative back and bilateral leg pain is greatly improved. Some residual soreness right lower extremity   Objective: Vital signs in last 24 hours: Temp:  [98 F (36.7 C)-99.8 F (37.7 C)] 98 F (36.7 C) (07/30 0527) Pulse Rate:  [88-113] 88 (07/30 0712) Resp:  [16-20] 20 (07/30 0527) BP: (97-137)/(50-63) 100/51 (07/30 0712) SpO2:  [94 %-100 %] 100 % (07/30 0712)  Intake/Output from previous day: 07/29 0701 - 07/30 0700 In: 360 [P.O.:360] Out: 2100 [Urine:2100] Intake/Output this shift: No intake/output data recorded.   Recent Labs  06/26/17 0439 06/27/17 0339  HGB 11.3* 9.4*    Recent Labs  06/26/17 0439 06/27/17 0339  WBC 11.3* 12.4*  RBC 3.65* 3.05*  HCT 33.6* 28.1*  PLT 226 183    Recent Labs  06/26/17 0439 06/27/17 0339  NA 137 138  K 4.0 3.8  CL 104 106  CO2 28 27  BUN 12 12  CREATININE 0.96 1.07  GLUCOSE 215* 239*  CALCIUM 8.8* 8.6*   No results for input(s): LABPT, INR in the last 72 hours.  Exam Patient very drowsy but I am able to arouse him and he does answer questions. Surgical incision is healing very well. No drainage or signs of infection. Bilateral calves are nontender. Neurovascularly intact. No focal motor deficits.  Assessment/Plan: Patient needs lumbar brace when he gets this can get up with PT. Plan for transfer to skilled nursing facility since patient lives alone and will not have any assistance. With increased drowsiness will D/C Valium, oxycodone and Norco 7.5/325.  We'll see how he does with Norco 5/325 one tab by mouth every 8 hours when necessary for pain. Will check CBC and cmet.  Benjiman Core 06/28/2017, 8:15 AM

## 2017-06-28 NOTE — Progress Notes (Signed)
Patient ID: GEROLD SAR, male   DOB: [birth year], 61 y.o.   MRN: [id number]   Just spoke with Clifton Custard. States the patient is still hypotensive and last blood pressure 105/56. Nurse also states that patient's abdomen is distended today. States that it was not like this last couple of days that she had him.  He states the patient has not had a bowel movement in the last couple of days. Positive flatus. States that he has good bowel sounds. Patient also has a ventriculoperitoneal shunt. We'll get stat KUB. Advised RN.

## 2017-06-28 NOTE — Progress Notes (Signed)
OT NOTE  Called [employer] regarding need for brace. Per [employer], brace should arrive @ lunchtime. Will plan to see pt after brace arrives.  [facility], OT/L  [phone] 06/28/2017

## 2017-06-28 NOTE — Progress Notes (Signed)
Physical Therapy Treatment Patient Details Name: [name] MRN: [id number] DOB: July 07, 1956 Today's Date: 06/28/2017    History of Present Illness Pt is a 61 y/o male s/p L5 -S1 BILATERAL T LIF (Bilateral). 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    PT Comments    Patient seen for mobility progression Still awaiting brace therefore mobility limited. Patient mobilizing with RW at min guard level, patient desires to return home if able to arrange 24/7 assist initially. [place] patient is progressing well but will need to see how patient does with use of brace. Will continue to see and progress as tolerated. NSG aware that we are still awaiting brace.   Follow Up Recommendations  SNF (if can arrange 24/7 assist, may consider d/c home)     Equipment Recommendations  Rolling walker with 5" wheels;3in1 (PT)    Recommendations for Other Services       Precautions / Restrictions Precautions Precautions: Back Precaution Booklet Issued: No Precaution Comments: verbally reviewed spinal precautions, log roll technique.  Required Braces or Orthoses:  (still awaiting brace) Restrictions Weight Bearing Restrictions: No    Mobility  Bed Mobility Overal bed mobility: Needs Assistance Bed Mobility: Rolling;Sidelying to Sit Rolling: Min guard Sidelying to sit: Min guard     Sit to sidelying: Min assist General bed mobility comments: Min guard for safety to come to upright, min assist to return to sidelying. Cues for positioning and technique  Transfers Overall transfer level: Needs assistance Equipment used: Rolling walker (2 wheeled) Transfers: Sit to/from Stand Sit to Stand: Min guard         General transfer comment: VCs for hand  placement and use of RW  Ambulation/Gait Ambulation/Gait assistance: Min guard Ambulation Distance (Feet): 70 Feet Assistive device: Rolling walker (2 wheeled) Gait Pattern/deviations: Step-to pattern;Antalgic Gait velocity: decreased   General Gait Details: Some noted RLE weakness improved with distance   Stairs            Wheelchair Mobility    Modified Rankin (Stroke Patients Only)       Balance Overall balance assessment: Needs assistance Sitting-balance support: Feet supported;Bilateral upper extremity supported Sitting balance-Leahy Scale: Fair     Standing balance support: Bilateral upper extremity supported Standing balance-Leahy Scale: Fair                              Cognition Arousal/Alertness: Awake/alert Behavior During Therapy: Flat affect Overall Cognitive Status: Within Functional Limits for tasks assessed                                        Exercises      General Comments        Pertinent Vitals/Pain Pain Assessment: No/denies pain    Home Living                      Prior Function            PT Goals (current goals can now be found in the care plan section) Acute Rehab PT Goals Patient Stated Goal: to get his right leg to feel better PT Goal Formulation: With patient Time For Goal Achievement: 07/03/17 Potential  to Achieve Goals: Good Progress towards PT goals: Progressing toward goals    Frequency    Min 5X/week      PT Plan Current plan remains appropriate    Co-evaluation              AM-PAC PT "6 Clicks" Daily Activity  Outcome Measure  Difficulty turning over in bed (including adjusting bedclothes, sheets and blankets)?: Total Difficulty moving from lying on back to sitting on the side of the bed? : Total Difficulty sitting down on and standing up from a chair with arms (e.g., wheelchair, bedside commode, etc,.)?: Total Help needed moving to and from a bed to  chair (including a wheelchair)?: A Little Help needed walking in hospital room?: A Little Help needed climbing 3-5 steps with a railing? : A Little 6 Click Score: 12    End of Session Equipment Utilized During Treatment: Gait belt Activity Tolerance: Patient limited by pain Patient left: in bed;with call bell/phone within reach;with bed alarm set Nurse Communication: Mobility status PT Visit Diagnosis: Muscle weakness (generalized) (M62.81);Difficulty in walking, not elsewhere classified (R26.2);Pain Pain - Right/Left: Right Pain - part of body: Leg (and back)     Time: 8756-4332 PT Time Calculation (min) (ACUTE ONLY): 18 min  Charges:  $Gait Training: 8-22 mins                    G Codes:       Alben Deeds, PT DPT  Board Certified Neurologic Specialist [place] 06/28/2017, 9:17 AM

## 2017-06-28 NOTE — Progress Notes (Signed)
CM met with [name] and he states he has decided he needs to go to rehab prior to going home. CSW updated. CM will continue to follow for d/c needs.  

## 2017-06-29 DIAGNOSIS — G834 Cauda equina syndrome: Secondary | ICD-10-CM | POA: Diagnosis present

## 2017-06-29 DIAGNOSIS — M4316 Spondylolisthesis, lumbar region: Secondary | ICD-10-CM | POA: Diagnosis present

## 2017-06-29 DIAGNOSIS — M48061 Spinal stenosis, lumbar region without neurogenic claudication: Secondary | ICD-10-CM

## 2017-06-29 DIAGNOSIS — E119 Type 2 diabetes mellitus without complications: Secondary | ICD-10-CM

## 2017-06-29 LAB — GLUCOSE, CAPILLARY
GLUCOSE-CAPILLARY: 250 mg/dL — AB (ref 65–99)
GLUCOSE-CAPILLARY: 287 mg/dL — AB (ref 65–99)
Glucose-Capillary: 214 mg/dL — ABNORMAL HIGH (ref 65–99)
Glucose-Capillary: 276 mg/dL — ABNORMAL HIGH (ref 65–99)

## 2017-06-29 MED ORDER — DOCUSATE SODIUM 100 MG PO CAPS: 100.0000 mg | ORAL_CAPSULE | Freq: Two times a day (BID) | ORAL | 0 refills | Status: DC

## 2017-06-29 MED ORDER — DIAZEPAM 2 MG PO TABS: 2.0000 mg | ORAL_TABLET | Freq: Three times a day (TID) | ORAL | 0 refills | Status: DC | PRN

## 2017-06-29 MED ORDER — OXYCODONE-ACETAMINOPHEN 5-325 MG PO TABS: 1.0000 | ORAL_TABLET | Freq: Four times a day (QID) | ORAL | 0 refills | Status: DC | PRN

## 2017-06-29 MED ORDER — TRAZODONE HCL 150 MG PO TABS: 150.0000 mg | ORAL_TABLET | Freq: Every day | ORAL | 0 refills | Status: DC

## 2017-06-29 MED ORDER — CELECOXIB 200 MG PO CAPS: 200.0000 mg | ORAL_CAPSULE | Freq: Every day | ORAL | 3 refills | Status: DC

## 2017-06-29 MED ORDER — QUETIAPINE FUMARATE 400 MG PO TABS: 800.0000 mg | ORAL_TABLET | Freq: Every day | ORAL | 2 refills | Status: DC

## 2017-06-29 MED ORDER — GABAPENTIN 300 MG PO CAPS: 300.0000 mg | ORAL_CAPSULE | Freq: Every day | ORAL | 3 refills | Status: AC

## 2017-06-29 MED ORDER — PALIPERIDONE ER 3 MG PO TB24: 3.0000 mg | ORAL_TABLET | Freq: Every day | ORAL | 2 refills | Status: DC

## 2017-06-29 NOTE — NC FL2 (Signed)
[place] LEVEL OF CARE SCREENING TOOL     IDENTIFICATION  Patient Name: [name] Birthdate: 1956-02-19 Sex: male Admission Date (Current Location): 06/24/2017  [facility] and [place] Number:  Herbalist and Address:  [facility], [place] [address] [place], [place] [address]      Provider Number: [id number]  Attending Physician Name and Address:  [name], MD  Relative Name and Phone Number:  Fredrich Birks, [phone]    Current Level of Care: Hospital Recommended Level of Care: [place] Prior Approval Number:    Date Approved/Denied:   PASRR Number: Pending  Discharge Plan: SNF    Current Diagnoses: Patient Active Problem List   Diagnosis Date Noted  . Diabetes mellitus without complication ([place]) 44/81/8563    Class: Chronic  . S/P lumbar spinal fusion 06/26/2017  . Myelopathy ([place]) 06/24/2017  . Low back pain 06/24/2017  . NPH (normal pressure hydrocephalus) 10/30/2016  . Normal pressure hydrocephalus 09/01/2016  . Bilateral sciatica 09/01/2016  . Altered mental status 07/01/2016  . Acute encephalopathy 07/01/2016  . Hydrocephalus in adult 07/01/2016  . Shaking spells 06/01/2016  . Gait disturbance 04/10/2015  . Numbness 04/10/2015  . Lumbar radicular pain 04/10/2015  . Lumbar spondylosis 04/10/2015  . Bipolar I disorder with depression, severe ([place]) 06/30/2014  . Suicidal ideations 06/30/2014  . Ulcer of lower limb, unspecified 02/20/2014  . CELLULITIS AND ABSCESS OF LEG EXCEPT FOOT 11/08/2007    Orientation RESPIRATION BLADDER Height & Weight     Self, Time, Situation, Place  Normal Indwelling catheter (just removed indwelling cath and will assess pt's ability to urinate afterward removal) Weight: 190 lb (86.2 kg) Height:  6' (182.9 cm)  BEHAVIORAL SYMPTOMS/MOOD NEUROLOGICAL BOWEL NUTRITION STATUS      Continent Diet (heart healthy/carb modified )  AMBULATORY STATUS COMMUNICATION  OF NEEDS Skin   Limited Assist Verbally Normal                       Personal Care Assistance Level of Assistance  Bathing, Feeding, Dressing Bathing Assistance: Limited assistance Feeding assistance: Independent Dressing Assistance: Limited assistance     Functional Limitations Info  Sight, Hearing, Speech Sight Info: Adequate Hearing Info: Adequate Speech Info: Adequate    SPECIAL CARE FACTORS FREQUENCY  PT (By licensed PT), OT (By licensed OT)     PT Frequency: 5x wk OT Frequency: 5x wk            Contractures Contractures Info: Not present    Additional Factors Info  Allergies   Allergies Info: Penicillins           Current Medications (06/29/2017):  This is the current hospital active medication list Current Facility-Administered Medications  Medication Dose Route Frequency Provider Last Rate Last Dose  . 0.9 %  sodium chloride infusion   Intravenous Continuous [name], MD 125 mL/hr at 06/26/17 0845    . 0.9 %  sodium chloride infusion  250 mL Intravenous Continuous [name], MD      . 0.9 %  sodium chloride infusion   Intravenous Continuous [name], MD 100 mL/hr at 06/27/17 0219    . acetaminophen (TYLENOL) tablet 650 mg  650 mg Oral Q4H PRN [name], MD   650 mg at 06/29/17 1449   Or  . acetaminophen (TYLENOL) suppository 650 mg  650 mg Rectal Q4H PRN [name], MD      .  alum & mag hydroxide-simeth (MAALOX/MYLANTA) 200-200-20 MG/5ML suspension 30 mL  30 mL Oral Q6H PRN [name], MD      . celecoxib (CELEBREX) capsule 200 mg  200 mg Oral Q12H [name], MD   200 mg at 06/29/17 5638  . chlorhexidine (HIBICLENS) 4 % liquid 4 application  60 mL Topical Once [name], MD      . cholecalciferol (VITAMIN D) tablet 1,000 Units  1,000 Units Oral Daily [name], MD   1,000 Units at 06/29/17 [phone]  . docusate sodium (COLACE) capsule 100 mg  100 mg Oral BID [name], MD   100 mg at 06/29/17 3329  .  gabapentin (NEURONTIN) capsule 300 mg  300 mg Oral QHS [name], MD   300 mg at 06/28/17 2248  . insulin aspart (novoLOG) injection 0-15 Units  0-15 Units Subcutaneous TID WC [name], MD   8 Units at 06/29/17 1159  . menthol-cetylpyridinium (CEPACOL) lozenge 3 mg  1 lozenge Oral PRN [name], MD       Or  . phenol (CHLORASEPTIC) mouth spray 1 spray  1 spray Mouth/Throat PRN [name], MD      . methocarbamol (ROBAXIN) tablet 500 mg  500 mg Oral Q6H PRN [name], MD   500 mg at 06/29/17 5188   Or  . methocarbamol (ROBAXIN) 500 mg in dextrose 5 % 50 mL IVPB  500 mg Intravenous Q6H PRN [name], MD      . ondansetron [facility]) tablet 4 mg  4 mg Oral Q6H PRN [name], MD       Or  . ondansetron [facility]) injection 4 mg  4 mg Intravenous Q6H PRN [name], MD      . paliperidone (INVEGA) 24 hr tablet 3 mg  3 mg Oral QHS [name], MD   3 mg at 06/28/17 2248  . pantoprazole (PROTONIX) injection 40 mg  40 mg Intravenous QHS [name], MD   40 mg at 06/28/17 2247  . polyethylene glycol (MIRALAX / GLYCOLAX) packet 17 g  17 g Oral Daily PRN [name], MD      . povidone-iodine 10 % swab 2 application  2 application Topical Once [name], MD      . QUEtiapine (SEROQUEL) tablet 800 mg  800 mg Oral QHS [name], MD   800 mg at 06/28/17 2248  . risperiDONE (RISPERDAL) tablet 1 mg  1 mg Oral BID [name], MD   1 mg at 06/29/17 4166  . sertraline (ZOLOFT) tablet 100 mg  100 mg Oral QHS [name], MD   100 mg at 06/28/17 2249  . sodium chloride flush (NS) 0.9 % injection 3 mL  3 mL Intravenous Q12H [name], MD   3 mL at 06/29/17 1000  . sodium chloride flush (NS) 0.9 % injection 3 mL  3 mL Intravenous PRN [name], MD      . sodium phosphate (FLEET) 7-19 GM/118ML enema 1 enema  1 enema Rectal Once PRN [name], MD      . tamsulosin (FLOMAX) capsule 0.4 mg  0.4 mg Oral QPC supper [name], MD   0.4 mg at  06/28/17 1625  . traZODone (DESYREL) tablet 150 mg  150 mg Oral QHS [name], MD   150 mg at 06/28/17 2249     Discharge Medications:  Please see discharge summary for a list of discharge medications.  Relevant Imaging Results:  Relevant Lab Results:   Additional Information SS#[id number]  Geralynn Ochs, [place]

## 2017-06-29 NOTE — Progress Notes (Signed)
Inpatient Diabetes Program Recommendations  AACE/ADA: New Consensus Statement on Inpatient Glycemic Control (2015)  Target Ranges:  Prepandial:   less than 140 mg/dL      Peak postprandial:   less than 180 mg/dL (1-2 hours)      Critically ill patients:  140 - 180 mg/dL   Lab Results  Component Value Date   GLUCAP 214 (H) 06/29/2017   HGBA1C 5.5 06/26/2017    Review of Glycemic Control  FBS elevated. May benefit from addition of basal insulin. HgbA1C may not be accurate with low H/H.  Inpatient Diabetes Program Recommendations:   Add Lantus 10 units Q24H.  Will continue to follow.  Thank you. Lorenda Peck, RD, LDN, CDE Inpatient Diabetes Coordinator [phone]

## 2017-06-29 NOTE — Care Management Note (Signed)
Case Management Note  Patient Details  Name: [name] MRN: [id number] Date of Birth: 1956/03/19  Subjective/Objective:                    Action/Plan: Pt discharging to SNF today. No further needs per CM.   Expected Discharge Date:  06/29/17               Expected Discharge Plan:  [place]  In-House Referral:     Discharge planning Services  CM Consult  Post Acute Care Choice:    Choice offered to:     DME Arranged:    DME Agency:     HH Arranged:  PT, OT HH Agency:     Status of Service:  Completed, signed off  If discussed at [place] of Stay Meetings, dates discussed:    Additional Comments:  Pollie Friar, RN 06/29/2017, 11:24 AM

## 2017-06-29 NOTE — Progress Notes (Signed)
Physical Therapy Treatment Patient Details Name: [name] MRN: [id number] DOB: [birth year] Today's Date: 06/29/2017    History of Present Illness Pt is a 61 y/o male s/p L5 -S1 BILATERAL T LIF (Bilateral). 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    PT Comments    Patient progressing with mobility and functional task performance. Patient requires assist to don brace. Still endorses pain in RLE and back. Current POC remains appropriate.   Follow Up Recommendations  SNF (if can arrange 24/7 assist, may consider d/c home)     Equipment Recommendations  Rolling walker with 5" wheels;3in1 (PT)    Recommendations for Other Services       Precautions / Restrictions Precautions Precautions: Back Precaution Booklet Issued: Yes (comment) Required Braces or Orthoses: Spinal Brace Spinal Brace: Thoracolumbosacral orthotic Restrictions Weight Bearing Restrictions: No    Mobility  Bed Mobility               General bed mobility comments: OOB in chair  Transfers Overall transfer level: Needs assistance Equipment used: Rolling walker (2 wheeled) Transfers: Sit to/from Stand Sit to Stand: Min guard         General transfer comment: VCs for hand placement and use of RW  Ambulation/Gait Ambulation/Gait assistance: Min guard Ambulation Distance (Feet): 90 Feet Assistive device: Rolling walker (2 wheeled) Gait Pattern/deviations: Step-to pattern;Antalgic Gait velocity: decreased Gait velocity interpretation: Below normal speed for age/gender General Gait Details: continues to report some instbaility in RLE, reliance on RW for support.   Stairs            Wheelchair Mobility    Modified Rankin (Stroke Patients Only)        Balance Overall balance assessment: Needs assistance Sitting-balance support: Feet supported;Bilateral upper extremity supported Sitting balance-Leahy Scale: Fair     Standing balance support: Bilateral upper extremity supported Standing balance-Leahy Scale: Fair                              Cognition Arousal/Alertness: Awake/alert Behavior During Therapy: WFL for tasks assessed/performed Overall Cognitive Status: Within Functional Limits for tasks assessed                                        Exercises      General Comments General comments (skin integrity, edema, etc.): patient required assist to don brace      Pertinent Vitals/Pain Pain Assessment: 0-10 Pain Score: 6  Pain Location: back Pain Descriptors / Indicators: Aching Pain Intervention(s): Monitored during session    Home Living                      Prior Function            PT Goals (current goals can now be found in the care plan section) Acute Rehab PT Goals Patient Stated Goal: to go home PT Goal Formulation: With patient Time For Goal Achievement: 07/03/17 Potential to Achieve Goals: Good Progress towards PT goals: Progressing toward goals    Frequency    Min 5X/week      PT Plan Current plan remains appropriate    Co-evaluation  AM-PAC PT "6 Clicks" Daily Activity  Outcome Measure  Difficulty turning over in bed (including adjusting bedclothes, sheets and blankets)?: Total Difficulty moving from lying on back to sitting on the side of the bed? : Total Difficulty sitting down on and standing up from a chair with arms (e.g., wheelchair, bedside commode, etc,.)?: A Little Help needed moving to and from a bed to chair (including a wheelchair)?: A Little Help needed walking in hospital room?: A Little Help needed climbing 3-5 steps with a railing? : A Little 6 Click Score: 14    End of Session Equipment Utilized During  Treatment: Gait belt;Back brace Activity Tolerance: Patient limited by pain Patient left: in chair;with call bell/phone within reach;with chair alarm set Nurse Communication: Mobility status PT Visit Diagnosis: Muscle weakness (generalized) (M62.81);Difficulty in walking, not elsewhere classified (R26.2);Pain Pain - Right/Left: Right Pain - part of body: Leg (and back)     Time: 0981-1914 PT Time Calculation (min) (ACUTE ONLY): 19 min  Charges:  $Gait Training: 8-22 mins                    G Codes:       Alben Deeds, PT DPT  Board Certified Neurologic Specialist [place] 06/29/2017, 9:03 AM

## 2017-06-29 NOTE — Progress Notes (Signed)
CSW met with patient to discuss placement options. Patient indicated preference for [facility], [facility], or [facility]. CSW contacted facilities to confirm bed availability. [facility], and will accept patient.  Patient still does not have PASRR. MD signed 30 day note, and CSW faxed over additional information to [facility], but has not heard anything back yet.   CSW will update when PASRR number is received and patient can transition to rehab.  Laveda Abbe, [place] Clinical Social Worker [phone]

## 2017-06-29 NOTE — Care Management Important Message (Signed)
Important Message  Patient Details  Name: [name] MRN: [id number] Date of Birth: October 09, 1956   Medicare Important Message Given:  Yes    Crystal Ellwood [place] 06/29/2017, 11:42 AM

## 2017-06-29 NOTE — Progress Notes (Signed)
Pt refused to ambulate in hall at beginning of shift d/t feeling like he would have another BM after the bowel treatment earlier in the day. He walked to bathroom X 2, walking 20 ft.  He had one  area of new sanguinous fluid and one old area of minimal drainage  at beginning of shift. Mid-shift, it was slightly larger. At change of shift, it was saturated and changed by day shift RN. No PRN pain meds required overnight.

## 2017-06-29 NOTE — Progress Notes (Signed)
Subjective: Patient doing well. Again preop back and leg pain greatly improved. Right leg residual soreness also better from yesterday. Patient is pleased. He is much more alert today and feels better.  Had a very good bowel movement yesterday.  Objective: Vital signs in last 24 hours: Temp:  [98.5 F (36.9 C)-99 F (37.2 C)] 98.7 F (37.1 C) (07/31 0453) Pulse Rate:  [89-103] 98 (07/31 0453) Resp:  [18-20] 18 (07/31 0453) BP: (105-141)/(56-64) 130/57 (07/31 0453) SpO2:  [94 %-99 %] 99 % (07/31 0453)  Intake/Output from previous day: 07/30 0701 - 07/31 0700 In: 1800 [P.O.:1800] Out: 2500 [Urine:2500] Intake/Output this shift: No intake/output data recorded.   Recent Labs  06/27/17 0339 06/28/17 0903  HGB 9.4* 9.0*    Recent Labs  06/27/17 0339 06/28/17 0903  WBC 12.4* 10.6*  RBC 3.05* 2.90*  HCT 28.1* 26.7*  PLT 183 193    Recent Labs  06/27/17 0339 06/28/17 0903  NA 138 141  K 3.8 3.9  CL 106 109  CO2 27 26  BUN 12 5*  CREATININE 1.07 0.86  GLUCOSE 239* 185*  CALCIUM 8.6* 8.8*   No results for input(s): LABPT, INR in the last 72 hours.  Exam Patient alert and oriented. Much more alert today and is at his baseline. Wound there is a couple millimeter area at the top of his incision with some serous sinus drainage. Nothing purulent. Honeycomb dressing saturated. New dressing applied with OpSite and 4 x 4's. Bilateral calves nontender. Neurovascularly intact. No focal motor deficits.  Assessment/Plan We'll continue to monitor wound. needs brace on when up and ambulating. Awaiting skilled nursing facility placement.   Benjiman Core 06/29/2017, 8:46 AM

## 2017-06-29 NOTE — Discharge Summary (Addendum)
Physician Discharge Summary      Patient ID: [name] MRN: [id number] DOB/AGE: 08-20-1956 61 y.o.  Admit date: 06/24/2017 Discharge date:06/30/2017   Admission Diagnoses:  Principal Problem:   Cauda equina compression [facility]) Active Problems:   Spinal stenosis of lumbar region   Spondylolisthesis, lumbar region   Diabetes mellitus without complication ([place])   Anemia due to blood loss   Myelopathy (HCC)   Low back pain   History of lumbar spinal fusion   Discharge Diagnoses:  Same  Past Medical History:  Diagnosis Date  . Anxiety   . Bipolar disorder ([place])   . Chronic back pain   . Depression   . Diabetes mellitus without complication ([place])   . Diabetic neuropathy ([place])   . DVT (deep venous thrombosis) ([place])   . Enlarged prostate   . Headache   . Hepatitis    Hepatitis C - has been treated with Harvoni (cleared in July, 2017)  . History of colon polyps   . Mental disorder    bipolar  . Peripheral vascular disease ([place])   . Pneumonia    hx of-about 64yrs ago  . Schizophrenia ([place])     Surgeries: Procedure(s): L5 -S1 BILATERAL T LIF on 06/24/2017 - 06/26/2017   Consultants:   Discharged Condition: Improved  Hospital Course: [name] is an 61 y.o. male who was admitted 06/24/2017 with a chief complaint of  Chief Complaint  Patient presents with  . Urinary Incontinence  , and found to have a diagnosis of Cauda equina compression ([place]).  He was brought to the operating room on 06/24/2017 - 06/26/2017 and underwent the above named procedures. CBJ:SEGBT [name] is an 61 y.o. male who has been experiencing weakness in his legs and pain radiating into the posterior thighs and calves and into the feet following a fall onto his back 3 weeks ago. He has a history of previous lumbar fusion surgery by Dr. Dimas [place] 5 years ago from L2-L5. 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 Did well until a fall 3 weeks ago and had had inability to  walk more than 150 feet and is experiencing pain associated with incontinence of bladder when the pain is severe. Seen in the office yesterday and evaluated with complaints of bilateral buttock and posterior thigh and leg numbness and weakness and loss of bladder control. He was referred to the ER for evaluation and MRI has been done showing severe L5-S1 stenosis findings with fluid within the facets at L5-S1.      He was given perioperative antibiotics:  Anti-infectives    Start     Dose/Rate Route Frequency Ordered Stop   06/26/17 2100  vancomycin (VANCOCIN) IVPB 1000 mg/200 mL premix  Status:  Discontinued     1,000 mg 200 mL/hr over 60 Minutes Intravenous On call to O.R. 06/26/17 0757 06/26/17 2041   06/26/17 0800  clindamycin (CLEOCIN) IVPB 900 mg  Status:  Discontinued     900 mg 100 mL/hr over 30 Minutes Intravenous On call to O.R. 06/26/17 0757 06/26/17 0800   06/26/17 0600  clindamycin (CLEOCIN) IVPB 600 mg     600 mg 100 mL/hr over 30 Minutes Intravenous Every 8 hours 06/26/17 0326 06/26/17 2314   06/25/17 2211  clindamycin (CLEOCIN) 900 MG/50ML IVPB    Comments:  [name]   : cabinet override      06/25/17 2211 06/25/17 2204    Seen in consultation on 06/25/2017, MRI demonstrating severe lumbar  spinal stenosis at L5-S1 below a 3 level lumbar fusion L2-L5. A [place] condom catheter was placed. Discussion concerning the problems of cauda equina symptoms in the face if severe stenosis findings at L5-S1.  He elected to have surgical intervention after discussion of the alterative treatment. Due to the nature of his symptoms and the increasing symptoms post fall I did recommend  Surgical intervention. Post surgery foley was discontinued on POD#1 and ambulation begun. A brace was not available for several days so PT and OT were instructed to start even without the brace in order to mobilize the patient and decrease risks associated with prolong bedrest post fusion. On POD#2 he was able  to ambulate in the hallway.  He did have some drainage from his incison site. Dressing  Changed and dry dressings applied. The drainage was serosangineous and consistent with a post op hematoma, Seroma. POD#3 the drainage was improving. SOcial service consulted to obtain SNF placement due to his living  Alone and history of mental disorder. Hgb was stable at 10-11 post op. At this time SNF placement is being undertaken. Plan will be to discharge him to a [facility]ille nursing faciltity to continue post operative rehabilitation for a period of 2-3 weeks the return to his apartment.  He was given sequential compression devices, early ambulation, and chemoprophylaxis for DVT prophylaxis.  He benefited maximally from their hospital stay and there were no complications.    Recent vital signs:  Vitals:   06/30/17 0447 06/30/17 0958  BP: 121/64 124/66  Pulse: 99 92  Resp: 18 20  Temp: 98.5 F (36.9 C) 98.1 F (36.7 C)    Recent laboratory studies:  Results for orders placed or performed during the hospital encounter of 06/24/17  Surgical PCR screen  Result Value Ref Range   MRSA, PCR NEGATIVE NEGATIVE   Staphylococcus aureus NEGATIVE NEGATIVE  Comprehensive metabolic panel  Result Value Ref Range   Sodium 141 135 - 145 mmol/L   Potassium 3.8 3.5 - 5.1 mmol/L   Chloride 103 101 - 111 mmol/L   CO2 29 22 - 32 mmol/L   Glucose, Bld 119 (H) 65 - 99 mg/dL   BUN 8 6 - 20 mg/dL   Creatinine, Ser 0.84 0.61 - 1.24 mg/dL   Calcium 9.5 8.9 - 10.3 mg/dL   Total Protein 7.7 6.5 - 8.1 g/dL   Albumin 3.9 3.5 - 5.0 g/dL   AST 18 15 - 41 U/L   ALT 12 (L) 17 - 63 U/L   Alkaline Phosphatase 89 38 - 126 U/L   Total Bilirubin 0.6 0.3 - 1.2 mg/dL   GFR calc non Af Amer >60 >60 mL/min   GFR calc Af Amer >60 >60 mL/min   Anion gap 9 5 - 15  CBC with Differential/Platelet  Result Value Ref Range   WBC 8.6 4.0 - 10.5 K/uL   RBC 4.21 (L) 4.22 - 5.81 MIL/uL   Hemoglobin 13.2 13.0 - 17.0 g/dL   HCT 38.4 (L)  39.0 - 52.0 %   MCV 91.2 78.0 - 100.0 fL   MCH 31.4 26.0 - 34.0 pg   MCHC 34.4 30.0 - 36.0 g/dL   RDW 11.4 (L) 11.5 - 15.5 %   Platelets 244 150 - 400 K/uL   Neutrophils Relative % 46 %   Neutro Abs 4.0 1.7 - 7.7 K/uL   Lymphocytes Relative 41 %   Lymphs Abs 3.6 0.7 - 4.0 K/uL   Monocytes Relative 9 %   Monocytes Absolute  0.7 0.1 - 1.0 K/uL   Eosinophils Relative 3 %   Eosinophils Absolute 0.3 0.0 - 0.7 K/uL   Basophils Relative 1 %   Basophils Absolute 0.1 0.0 - 0.1 K/uL  Glucose, capillary  Result Value Ref Range   Glucose-Capillary 167 (H) 65 - 99 mg/dL  Hemoglobin A1c  Result Value Ref Range   Hgb A1c MFr Bld 5.5 4.8 - 5.6 %   Mean Plasma Glucose 111 mg/dL  CBC  Result Value Ref Range   WBC 11.3 (H) 4.0 - 10.5 K/uL   RBC 3.65 (L) 4.22 - 5.81 MIL/uL   Hemoglobin 11.3 (L) 13.0 - 17.0 g/dL   HCT 33.6 (L) 39.0 - 52.0 %   MCV 92.1 78.0 - 100.0 fL   MCH 31.0 26.0 - 34.0 pg   MCHC 33.6 30.0 - 36.0 g/dL   RDW 11.6 11.5 - 15.5 %   Platelets 226 150 - 400 K/uL  Basic Metabolic Panel  Result Value Ref Range   Sodium 137 135 - 145 mmol/L   Potassium 4.0 3.5 - 5.1 mmol/L   Chloride 104 101 - 111 mmol/L   CO2 28 22 - 32 mmol/L   Glucose, Bld 215 (H) 65 - 99 mg/dL   BUN 12 6 - 20 mg/dL   Creatinine, Ser 0.96 0.61 - 1.24 mg/dL   Calcium 8.8 (L) 8.9 - 10.3 mg/dL   GFR calc non Af Amer >60 >60 mL/min   GFR calc Af Amer >60 >60 mL/min   Anion gap 5 5 - 15  Glucose, capillary  Result Value Ref Range   Glucose-Capillary 205 (H) 65 - 99 mg/dL   Comment 1 Notify RN    Comment 2 Document in Chart   Glucose, capillary  Result Value Ref Range   Glucose-Capillary 170 (H) 65 - 99 mg/dL  Glucose, capillary  Result Value Ref Range   Glucose-Capillary 160 (H) 65 - 99 mg/dL   Comment 1 Notify RN    Comment 2 Document in Chart   CBC  Result Value Ref Range   WBC 12.4 (H) 4.0 - 10.5 K/uL   RBC 3.05 (L) 4.22 - 5.81 MIL/uL   Hemoglobin 9.4 (L) 13.0 - 17.0 g/dL   HCT 28.1 (L) 39.0 -  52.0 %   MCV 92.1 78.0 - 100.0 fL   MCH 30.8 26.0 - 34.0 pg   MCHC 33.5 30.0 - 36.0 g/dL   RDW 11.5 11.5 - 15.5 %   Platelets 183 150 - 400 K/uL  Basic metabolic panel  Result Value Ref Range   Sodium 138 135 - 145 mmol/L   Potassium 3.8 3.5 - 5.1 mmol/L   Chloride 106 101 - 111 mmol/L   CO2 27 22 - 32 mmol/L   Glucose, Bld 239 (H) 65 - 99 mg/dL   BUN 12 6 - 20 mg/dL   Creatinine, Ser 1.07 0.61 - 1.24 mg/dL   Calcium 8.6 (L) 8.9 - 10.3 mg/dL   GFR calc non Af Amer >60 >60 mL/min   GFR calc Af Amer >60 >60 mL/min   Anion gap 5 5 - 15  Glucose, capillary  Result Value Ref Range   Glucose-Capillary 195 (H) 65 - 99 mg/dL   Comment 1 Notify RN    Comment 2 Document in Chart   Glucose, capillary  Result Value Ref Range   Glucose-Capillary 241 (H) 65 - 99 mg/dL   Comment 1 Notify RN    Comment 2 Document in Chart   Glucose,  capillary  Result Value Ref Range   Glucose-Capillary 159 (H) 65 - 99 mg/dL  Glucose, capillary  Result Value Ref Range   Glucose-Capillary 183 (H) 65 - 99 mg/dL   Comment 1 Notify RN    Comment 2 Document in Chart   Glucose, capillary  Result Value Ref Range   Glucose-Capillary 244 (H) 65 - 99 mg/dL   Comment 1 Notify RN    Comment 2 Document in Chart   Glucose, capillary  Result Value Ref Range   Glucose-Capillary 214 (H) 65 - 99 mg/dL   Comment 1 Notify RN    Comment 2 Document in Chart   CBC  Result Value Ref Range   WBC 10.6 (H) 4.0 - 10.5 K/uL   RBC 2.90 (L) 4.22 - 5.81 MIL/uL   Hemoglobin 9.0 (L) 13.0 - 17.0 g/dL   HCT 26.7 (L) 39.0 - 52.0 %   MCV 92.1 78.0 - 100.0 fL   MCH 31.0 26.0 - 34.0 pg   MCHC 33.7 30.0 - 36.0 g/dL   RDW 11.3 (L) 11.5 - 15.5 %   Platelets 193 150 - 400 K/uL  Comprehensive metabolic panel  Result Value Ref Range   Sodium 141 135 - 145 mmol/L   Potassium 3.9 3.5 - 5.1 mmol/L   Chloride 109 101 - 111 mmol/L   CO2 26 22 - 32 mmol/L   Glucose, Bld 185 (H) 65 - 99 mg/dL   BUN 5 (L) 6 - 20 mg/dL   Creatinine, Ser  0.86 0.61 - 1.24 mg/dL   Calcium 8.8 (L) 8.9 - 10.3 mg/dL   Total Protein 6.0 (L) 6.5 - 8.1 g/dL   Albumin 2.8 (L) 3.5 - 5.0 g/dL   AST 18 15 - 41 U/L   ALT 15 (L) 17 - 63 U/L   Alkaline Phosphatase 66 38 - 126 U/L   Total Bilirubin 0.4 0.3 - 1.2 mg/dL   GFR calc non Af Amer >60 >60 mL/min   GFR calc Af Amer >60 >60 mL/min   Anion gap 6 5 - 15  Glucose, capillary  Result Value Ref Range   Glucose-Capillary 125 (H) 65 - 99 mg/dL  Glucose, capillary  Result Value Ref Range   Glucose-Capillary 176 (H) 65 - 99 mg/dL  Glucose, capillary  Result Value Ref Range   Glucose-Capillary 214 (H) 65 - 99 mg/dL  Glucose, capillary  Result Value Ref Range   Glucose-Capillary 199 (H) 65 - 99 mg/dL   Comment 1 Notify RN    Comment 2 Document in Chart   Glucose, capillary  Result Value Ref Range   Glucose-Capillary 214 (H) 65 - 99 mg/dL  Glucose, capillary  Result Value Ref Range   Glucose-Capillary 276 (H) 65 - 99 mg/dL  Glucose, capillary  Result Value Ref Range   Glucose-Capillary 250 (H) 65 - 99 mg/dL  Glucose, capillary  Result Value Ref Range   Glucose-Capillary 287 (H) 65 - 99 mg/dL   Comment 1 Notify RN    Comment 2 Document in Chart   Glucose, capillary  Result Value Ref Range   Glucose-Capillary 246 (H) 65 - 99 mg/dL   Comment 1 Notify RN    Comment 2 Document in Chart   Glucose, capillary  Result Value Ref Range   Glucose-Capillary 377 (H) 65 - 99 mg/dL    Discharge Medications:   Allergies as of 06/30/2017      Reactions   Penicillins Hives, Other (See Comments)   Has patient had a  PCN reaction causing immediate rash, facial/tongue/throat swelling, SOB or lightheadedness with hypotension: No Has patient had a PCN reaction causing severe rash involving mucus membranes or skin necrosis: No Has patient had a PCN reaction that required hospitalization No Has patient had a PCN reaction occurring within the last 10 years: No If all of the above answers are "NO", then may  proceed with Cephalosporin use.      Medication List    STOP taking these medications   cyclobenzaprine 10 MG tablet Commonly known as:  FLEXERIL   HYDROcodone-acetaminophen 5-325 MG tablet Commonly known as:  NORCO   ibuprofen 600 MG tablet Commonly known as:  ADVIL,MOTRIN   predniSONE 50 MG tablet Commonly known as:  DELTASONE   traMADol 50 MG tablet Commonly known as:  ULTRAM     TAKE these medications   celecoxib 200 MG capsule Commonly known as:  CELEBREX Take 1 capsule (200 mg total) by mouth daily.   cholecalciferol 1000 units tablet Commonly known as:  VITAMIN D Take 1,000 Units by mouth daily.   diazepam 2 MG tablet Commonly known as:  VALIUM Take 1 tablet (2 mg total) by mouth every 8 (eight) hours as needed for muscle spasms. What changed:  when to take this   docusate sodium 100 MG capsule Commonly known as:  COLACE Take 1 capsule (100 mg total) by mouth 2 (two) times daily.   ferrous gluconate 324 MG tablet Commonly known as:  FERGON Take 1 tablet (324 mg total) by mouth 2 (two) times daily with a meal.   gabapentin 300 MG capsule Commonly known as:  NEURONTIN Take 300 mg by mouth at bedtime. What changed:  Another medication with the same name was added. Make sure you understand how and when to take each.   gabapentin 300 MG capsule Commonly known as:  NEURONTIN Take 1 capsule (300 mg total) by mouth at bedtime. What changed:  You were already taking a medication with the same name, and this prescription was added. Make sure you understand how and when to take each.   metFORMIN 500 MG tablet Commonly known as:  GLUCOPHAGE Take 500 mg by mouth daily with breakfast.   oxyCODONE-acetaminophen 5-325 MG tablet Commonly known as:  PERCOCET/ROXICET Take 1-2 tablets by mouth every 6 (six) hours as needed for severe pain. What changed:  how much to take   paliperidone 3 MG 24 hr tablet Commonly known as:  INVEGA Take 3 mg by mouth at  bedtime. What changed:  Another medication with the same name was added. Make sure you understand how and when to take each.   paliperidone 3 MG 24 hr tablet Commonly known as:  INVEGA Take 1 tablet (3 mg total) by mouth at bedtime. What changed:  You were already taking a medication with the same name, and this prescription was added. Make sure you understand how and when to take each.   QUEtiapine 400 MG tablet Commonly known as:  SEROQUEL Take 800 mg by mouth at bedtime. What changed:  Another medication with the same name was added. Make sure you understand how and when to take each.   QUEtiapine 400 MG tablet Commonly known as:  SEROQUEL Take 2 tablets (800 mg total) by mouth at bedtime. What changed:  You were already taking a medication with the same name, and this prescription was added. Make sure you understand how and when to take each.   risperiDONE 1 MG tablet Commonly known as:  RISPERDAL Take 1  tablet (1 mg total) by mouth 2 (two) times daily.   sertraline 100 MG tablet Commonly known as:  ZOLOFT Take 100 mg by mouth at bedtime.   sitaGLIPtin 100 MG tablet Commonly known as:  JANUVIA Take 100 mg by mouth daily with breakfast.   tamsulosin 0.4 MG Caps capsule Commonly known as:  FLOMAX Take 0.4 mg by mouth daily after supper.   traZODone 150 MG tablet Commonly known as:  DESYREL Take 150 mg by mouth at bedtime. What changed:  Another medication with the same name was added. Make sure you understand how and when to take each.   traZODone 150 MG tablet Commonly known as:  DESYREL Take 1 tablet (150 mg total) by mouth at bedtime. What changed:  You were already taking a medication with the same name, and this prescription was added. Make sure you understand how and when to take each.            Durable Medical Equipment        Start     Ordered   06/26/17 0326  DME Walker rolling  Once    Question:  Patient needs a walker to treat with the following  condition  Answer:  S/P lumbar fusion   06/26/17 0326   06/26/17 0326  DME 3 n 1  Once     06/26/17 0326       Diagnostic Studies: Dg Lumbar Spine 2-3 Views  Result Date: 06/26/2017 CLINICAL DATA:  TLIF L5-S1 EXAM: LUMBAR SPINE - 2-3 VIEW; DG C-ARM 61-120 MIN COMPARISON:  MRI lumbar spine 06/25/2017 and lumbar spine radiographs 06/05/2017 FINDINGS: Intraoperative fluoroscopy is obtained for surgical control purposes. Fluoroscopy time is not reported. Six spot fluoroscopic images are obtained. Spot fluoroscopic images obtained demonstrate postoperative changes with intervertebral disc prosthesis and posterior plate and screw fixation at L5-S1. Previous posterior fixation of the upper lumbar spine. Visualized hardware appears intact. IMPRESSION: Intraoperative fluoroscopy obtained for surgical control purposes demonstrating posterior fixation and intervertebral disc prosthesis at L5-S1. Electronically Signed   By: Lucienne Capers M.D.   On: 06/26/2017 01:54   Dg Lumbar Spine Complete  Result Date: 06/05/2017 CLINICAL DATA:  Back pain with radiation to right leg. Fall 2 weeks ago. Chronic back pain. EXAM: LUMBAR SPINE - COMPLETE 4+ VIEW COMPARISON:  CT of 05/24/2017 FINDINGS: Five lumbar type vertebral bodies. Sacroiliac joints are symmetric. Age advanced atherosclerosis. VP shunt catheter. Status post L2-L5 trans pedicle screw fixation. No acute hardware complication. Facet arthropathy involves L5-S1. Abdominal aortic atherosclerosis. IMPRESSION: Postsurgical changes of L2-5 trans pedicle screw fixation, without acute hardware complication. Spondylosis. Aortic Atherosclerosis (ICD10-I70.0).  This is age advanced. Electronically Signed   By: Abigail Miyamoto M.D.   On: 06/05/2017 19:53   Dg Abd 1 View  Result Date: 06/28/2017 CLINICAL DATA:  Generalized abdominal distension. Tense abdomen to exam. No report pain. EXAM: ABDOMEN - 1 VIEW COMPARISON:  Abdominal radiograph of February 13, 2017. FINDINGS: The  colonic stool burden is increased diffusely. No small or large bowel obstructive pattern is observed. The patient has undergone previous lumbar fusion. A ventriculoperitoneal shunt tube is present. IMPRESSION: Increased colonic stool burden consistent with constipation. No acute intra-abdominal abnormality is observed. Electronically Signed   By: David  [place] M.D.   On: 06/28/2017 14:34   Mr Lumbar Spine Wo Contrast  Result Date: 06/25/2017 CLINICAL DATA:  61 y/o M; worsening bilateral lower extremity pain over the last week. Urinary incontinence. EXAM: MRI LUMBAR SPINE WITHOUT CONTRAST TECHNIQUE: Multiplanar,  multisequence MR imaging of the lumbar spine was performed. No intravenous contrast was administered. COMPARISON:  06/05/2017 lumbar spine radiographs. 05/24/2017 lumbar spine CT. 08/23/2007 lumbar spine MRI. FINDINGS: Segmentation:  Standard. Alignment:  Physiologic. Vertebrae: Mild edema and bilateral L5-S1 facet joints extending into the pedicles with trace facet effusions. No evidence for discitis or acute fracture. Susceptibility artifact from posterior instrumentation hardware at the L2 through L5 levels partially obscures the vertebral bodies at those levels. Conus medullaris: Extends to the L1 level and appears normal. Paraspinal and other soft tissues: Left kidney interpolar 11 mm T2 hyperintense structure, likely cyst. 3 mm cyst in the right interpolar kidney. There is a fluid collection measuring up to 7 mm thickness surrounding the left-sided lumbar fusion. Rod with extending from the L2-3 to L5-S1 levels. No significant edema surrounding the collection, likely representing seroma. Disc levels: L1-2: Minimal disc bulge and mild facet hypertrophy. No significant foraminal or canal stenosis. L2-3: No disc displacement. Mild facet hypertrophy. No significant foraminal or canal stenosis. L3-4: No disc displacement. Mild facet hypertrophy. No significant foraminal or canal stenosis. L4-5:  Discectomy. Right-sided endplate marginal osteophytes marginal osteophytes and prominent facet hypertrophy with moderate right foraminal stenosis. No significant canal or left foraminal stenosis. L5-S1: Small disc bulge with moderate disc and ligamentum flavum hypertrophy. Moderate right and mild left foraminal stenosis. Severe canal stenosis. IMPRESSION: 1. L2-L5 posterior instrumented fusion. Small seroma surrounding the left-sided vertical rod. 2. Edema of the bilateral L5-S1 facet joints, likely degenerative. 3. L5-S1 adjacent segment disease with multifactorial severe canal stenosis. 4. Moderate right L4-5, moderate right L5-S1, and mild left L5-S1 foraminal stenosis. Electronically Signed   By: Kristine Garbe M.D.   On: 06/25/2017 03:16   Dg C-arm 61-120 Min  Result Date: 06/26/2017 CLINICAL DATA:  TLIF L5-S1 EXAM: LUMBAR SPINE - 2-3 VIEW; DG C-ARM 61-120 MIN COMPARISON:  MRI lumbar spine 06/25/2017 and lumbar spine radiographs 06/05/2017 FINDINGS: Intraoperative fluoroscopy is obtained for surgical control purposes. Fluoroscopy time is not reported. Six spot fluoroscopic images are obtained. Spot fluoroscopic images obtained demonstrate postoperative changes with intervertebral disc prosthesis and posterior plate and screw fixation at L5-S1. Previous posterior fixation of the upper lumbar spine. Visualized hardware appears intact. IMPRESSION: Intraoperative fluoroscopy obtained for surgical control purposes demonstrating posterior fixation and intervertebral disc prosthesis at L5-S1. Electronically Signed   By: Lucienne Capers M.D.   On: 06/26/2017 01:54    Disposition: 01-Home or Self Care  Discharge Instructions    Call MD / Call 911    Complete by:  As directed    If you experience chest pain or shortness of breath, CALL 911 and be transported to the hospital emergency room.  If you develope a fever above 101 F, pus (white drainage) or increased drainage or redness at the wound, or  calf pain, call your surgeon's office.   Constipation Prevention    Complete by:  As directed    Drink plenty of fluids.  Prune juice may be helpful.  You may use a stool softener, such as Colace (over the counter) 100 mg twice a day.  Use MiraLax (over the counter) for constipation as needed.   Diet Carb Modified    Complete by:  As directed    Discharge instructions    Complete by:  As directed    Call if there is increasing drainage, fever greater than 101.5, severe head aches, and worsening nausea or light sensitivity. If shortness of breath, bloody cough or chest tightness or pain  go to an emergency room. No lifting greater than 10 lbs. Avoid bending, stooping and twisting. Use brace when sitting and out of bed even to go to bathroom. Walk in house for first 2 weeks then may start to get out slowly increasing distances up to one mile by 4-6 weeks post op. After 5 days may shower and change dressing following bathing with shower.When bathing remove the brace shower and replace brace before getting out of the shower. If drainage, keep dry dressing and do not bathe the incision, use an moisture impervious dressing. Please call and return for scheduled follow up appointment 2 weeks from the time of surgery.   Driving restrictions    Complete by:  As directed    No driving.   Increase activity slowly as tolerated    Complete by:  As directed    Lifting restrictions    Complete by:  As directed    No lifting for 12 weeks       Contact information for follow-up providers    [name], MD Follow up in 2 week(s).   Specialty:  Orthopedic Surgery Why:  For wound re-check Contact information: [place] [address] [phone]            Contact information for after-discharge care    [facility] .   Specialty:  [place] information: [address] [place]  [place] [phone]                   Signed: [name] 06/30/2017, 1:22 PM

## 2017-06-30 ENCOUNTER — Telehealth (INDEPENDENT_AMBULATORY_CARE_PROVIDER_SITE_OTHER): Payer: Self-pay | Admitting: *Deleted

## 2017-06-30 DIAGNOSIS — D500 Iron deficiency anemia secondary to blood loss (chronic): Secondary | ICD-10-CM | POA: Diagnosis not present

## 2017-06-30 LAB — GLUCOSE, CAPILLARY
GLUCOSE-CAPILLARY: 246 mg/dL — AB (ref 65–99)
GLUCOSE-CAPILLARY: 377 mg/dL — AB (ref 65–99)

## 2017-06-30 MED ORDER — FERROUS GLUCONATE 324 (38 FE) MG PO TABS: 324.0000 mg | ORAL_TABLET | Freq: Two times a day (BID) | ORAL | 0 refills | Status: DC

## 2017-06-30 MED ORDER — PANTOPRAZOLE SODIUM 40 MG PO TBEC: 40.0000 mg | DELAYED_RELEASE_TABLET | Freq: Every day | ORAL | Status: DC

## 2017-06-30 MED ORDER — FERROUS GLUCONATE 324 (38 FE) MG PO TABS
324.0000 mg | ORAL_TABLET | Freq: Two times a day (BID) | ORAL | Status: DC
Start: 2017-06-30 — End: 2017-06-30
  Filled 2017-06-30 (×2): qty 1

## 2017-06-30 NOTE — Telephone Encounter (Signed)
A prescription for percocet is in the computer as being available as of 7/31,  I did sign off on Rx for hydrocodone also. It is too early 5 days since a lumbar fusion to expect Tylenol would be enough for pain. jen

## 2017-06-30 NOTE — Telephone Encounter (Signed)
[facility] called stating pt is requesting a stronger pain medication other than tylenol, pt has notified staff they he can not take Tylenol d/t upsets stomach and is very upset that he was given tylenol without his knowledge.   Pt is in [place] Rm 4, Nira Conn is requiesting a call back at [phone]

## 2017-06-30 NOTE — Progress Notes (Signed)
Discharge to: [facility] Anticipated discharge date: 06/30/17 Family notified: Yes, by phone Transportation by: [facility]  Report #: [id number], Room 128B  [place] signing off.  Laveda Abbe LCSW [phone]

## 2017-06-30 NOTE — Progress Notes (Signed)
Occupational Therapy Treatment Patient Details Name: [name] MRN: [id number] DOB: Mar 10, 1956 Today's Date: 06/30/2017    History of present illness Pt is a 61 y/o male s/p L5 -S1 BILATERAL T LIF (Bilateral). 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   OT comments  Pt making progress towards OT goals. This session focused on brace education (Pt refusing until "I walk with PT") and education on compensatory strategies for maintaining back precautions during ADL, including AE education. Pt educated on grabber/reacher and long handle sponge as well as reinforcement for tongs as toilet aide. Pt remains appropriate for SNF level therapy as he lives alone and will need continued therapy.   Follow Up Recommendations  SNF    Equipment Recommendations  3 in 1 bedside commode;Other (comment) (defer to next venue)    Recommendations for Other Services      Precautions / Restrictions Precautions Precautions: Back Precaution Booklet Issued: Yes (comment) Precaution Comments: verbally reviewed spinal precautions Required Braces or Orthoses: Spinal Brace Spinal Brace: Thoracolumbosacral orthotic;Applied in sitting position;Applied in standing position (Pt declined this session) Restrictions Weight Bearing Restrictions: No       Mobility Bed Mobility               General bed mobility comments: Pt sitting EOB when OT walked into room  Transfers Overall transfer level: Needs assistance Equipment used: Rolling walker (2 wheeled) Transfers: Sit to/from [facility] Sit to Stand: Min guard Stand pivot transfers: Min guard       General transfer comment: VCs for hand placement and use of RW    Balance Overall balance  assessment: Needs assistance Sitting-balance support: Feet supported;No upper extremity supported Sitting balance-Leahy Scale: Good Sitting balance - Comments: sitting EOB for grooming activity   Standing balance support: Bilateral upper extremity supported Standing balance-Leahy Scale: Fair Standing balance comment: uses RW, but able to perform static standing with no UE support                           ADL either performed or assessed with clinical judgement   ADL Overall ADL's : Needs assistance/impaired     Grooming: Set up;Sitting Grooming Details (indicate cue type and reason): Pt performed sitting EOB - Pt declined sink level grooming due to donning the brace despite max encouragement from OT                 Toilet Transfer: Min guard;Stand-pivot;Cueing for safety;BSC;RW Toilet Transfer Details (indicate cue type and reason): vc for safe hand placement with RW; simulated through recliner transfer           General ADL Comments: verbally reviewed importance and don/doff of brace.      Vision       Perception     Praxis      Cognition Arousal/Alertness: Awake/alert Behavior During Therapy: WFL for tasks assessed/performed Overall Cognitive Status: Within Functional Limits for tasks assessed                                          Exercises     Shoulder Instructions       General Comments      Pertinent  Vitals/ Pain       Pain Assessment: 0-10 Pain Score: 8  Pain Location: back Pain Descriptors / Indicators: Aching Pain Intervention(s): Monitored during session;Repositioned;Patient requesting pain meds-RN notified  Home Living                                          Prior Functioning/Environment              Frequency  Min 2X/week        Progress Toward Goals  OT Goals(current goals can now be found in the care plan section)  Progress towards OT goals: Progressing toward  goals  Acute Rehab OT Goals Patient Stated Goal: to go home OT Goal Formulation: With patient Time For Goal Achievement: 07/10/17 Potential to Achieve Goals: Good  Plan Discharge plan remains appropriate    Co-evaluation                 AM-PAC PT "6 Clicks" Daily Activity     Outcome Measure   Help from another person eating meals?: None Help from another person taking care of personal grooming?: A Little Help from another person toileting, which includes using toliet, bedpan, or urinal?: A Lot Help from another person bathing (including washing, rinsing, drying)?: A Lot Help from another person to put on and taking off regular upper body clothing?: A Lot Help from another person to put on and taking off regular lower body clothing?: A Lot 6 Click Score: 15    End of Session Equipment Utilized During Treatment: Gait belt;Rolling walker (declined use of brace this session despite max encourgament)  OT Visit Diagnosis: Unsteadiness on feet (R26.81);Other abnormalities of gait and mobility (R26.89);Muscle weakness (generalized) (M62.81);Pain Pain - Right/Left: Right Pain - part of body: Leg (back)   Activity Tolerance Patient tolerated treatment well   Patient Left in chair;with call bell/phone within reach;with chair alarm set   Nurse Communication Patient requests pain meds        Time: 4210-3128 OT Time Calculation (min): 28 min  Charges: OT General Charges $OT Visit: 1 Procedure OT Treatments $Self Care/Home Management : 23-37 mins  Hulda Humphrey OTR/L [place] 06/30/2017, 11:45 AM

## 2017-06-30 NOTE — Progress Notes (Signed)
Physical Therapy Treatment Patient Details Name: [name] MRN: [id number] DOB: 03-03-1956 Today's Date: 06/30/2017    History of Present Illness Pt is a 61 y/o male s/p L5 -S1 BILATERAL T LIF (Bilateral). 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    PT Comments    Pt making good progress with functional mobility. Pt would continue to benefit from skilled physical therapy services at this time while admitted and after d/c to address the below listed limitations in order to improve overall safety and independence with functional mobility.    Follow Up Recommendations  SNF     Equipment Recommendations  Rolling walker with 5" wheels;3in1 (PT)    Recommendations for Other Services       Precautions / Restrictions Precautions Precautions: Back Precaution Booklet Issued: Yes (comment) Precaution Comments: verbally reviewed spinal precautions Required Braces or Orthoses: Spinal Brace Spinal Brace: Thoracolumbosacral orthotic;Applied in sitting position Restrictions Weight Bearing Restrictions: No    Mobility  Bed Mobility               General bed mobility comments: pt OOB in recliner upon arrival  Transfers Overall transfer level: Needs assistance Equipment used: Rolling walker (2 wheeled) Transfers: Sit to/from Stand Sit to Stand: Min guard Stand pivot transfers: Min guard       General transfer comment: good technique, steady with rise into standing, min guard for safety  Ambulation/Gait Ambulation/Gait assistance: Min guard Ambulation Distance (Feet): 150 Feet Assistive device: Rolling walker (2 wheeled) Gait Pattern/deviations: Step-through pattern;Decreased stride length Gait velocity: decreased Gait velocity  interpretation: Below normal speed for age/gender General Gait Details: no instability or LOB with TLSO donned; min guard for safety   Stairs            Wheelchair Mobility    Modified Rankin (Stroke Patients Only)       Balance Overall balance assessment: Needs assistance Sitting-balance support: Feet supported;No upper extremity supported Sitting balance-Leahy Scale: Good Sitting balance - Comments: sitting EOB for grooming activity   Standing balance support: No upper extremity supported;During functional activity Standing balance-Leahy Scale: Fair Standing balance comment: pt able to statically stand without RW, min guard                            Cognition Arousal/Alertness: Awake/alert Behavior During Therapy: WFL for tasks assessed/performed Overall Cognitive Status: Within Functional Limits for tasks assessed                                        Exercises      General Comments        Pertinent Vitals/Pain Pain Assessment: Faces Pain Score: 8  Faces Pain Scale: Hurts a little bit Pain Location: back, bilateral LEs Pain Descriptors / Indicators: Sore Pain Intervention(s): Monitored during session;Repositioned    Home Living                      Prior Function            PT Goals (current goals can now be found in the care plan section) Acute Rehab PT Goals Patient Stated Goal: to go home PT Goal Formulation: With patient Time  For Goal Achievement: 07/03/17 Potential to Achieve Goals: Good Progress towards PT goals: Progressing toward goals    Frequency    Min 5X/week      PT Plan Current plan remains appropriate    Co-evaluation              AM-PAC PT "6 Clicks" Daily Activity  Outcome Measure  Difficulty turning over in bed (including adjusting bedclothes, sheets and blankets)?: Total Difficulty moving from lying on back to sitting on the side of the bed? : Total Difficulty sitting  down on and standing up from a chair with arms (e.g., wheelchair, bedside commode, etc,.)?: A Little Help needed moving to and from a bed to chair (including a wheelchair)?: A Little Help needed walking in hospital room?: A Little Help needed climbing 3-5 steps with a railing? : A Little 6 Click Score: 14    End of Session Equipment Utilized During Treatment: Gait belt;Back brace Activity Tolerance: Patient tolerated treatment well Patient left: in chair;with call bell/phone within reach;with chair alarm set Nurse Communication: Mobility status PT Visit Diagnosis: Muscle weakness (generalized) (M62.81);Difficulty in walking, not elsewhere classified (R26.2);Pain Pain - part of body:  (back)     Time: 3716-9678 PT Time Calculation (min) (ACUTE ONLY): 19 min  Charges:  $Gait Training: 8-22 mins                    G Codes:       [place], [place], [place] 06/30/2017, 12:54 PM

## 2017-06-30 NOTE — Telephone Encounter (Signed)
[facility] called stating pt is requesting a stronger pain medication other than tylenol, pt has notified staff they he can not take Tylenol d/t upsets stomach and is very upset that he was given tylenol without his knowledge.   Pt is in [place] Rm 4, Nira Conn is requiesting a call back at [phone]

## 2017-06-30 NOTE — Progress Notes (Signed)
Patient discharged to [facility]. IV discontinued. [facility] arrived to transport patient to SNF. Patient continues to be verbally abusive to staff. Wendee Copp

## 2017-06-30 NOTE — Progress Notes (Signed)
     Subjective: 5 Days Post-Op Procedure(s) (LRB): L5 -S1 BILATERAL T LIF (Bilateral) Awake, alert and oriented x 4. My  Left leg aches as much as the right in the thighs. Moved bowels 2 days ago. Patient reports pain as moderate.    Objective:   VITALS:  Temp:  [97.9 F (36.6 C)-98.5 F (36.9 C)] 98.5 F (36.9 C) (08/01 0447) Pulse Rate:  [93-101] 99 (08/01 0447) Resp:  [18] 18 (08/01 0447) BP: (108-129)/(55-67) 121/64 (08/01 0447) SpO2:  [99 %-100 %] 100 % (08/01 0447)  Neurologically intact ABD soft Neurovascular intact Sensation intact distally Intact pulses distally Dorsiflexion/Plantar flexion intact Incision: no drainage No cellulitis present   LABS  Recent Labs  06/28/17 0903  HGB 9.0*  WBC 10.6*  PLT 193    Recent Labs  06/28/17 0903  NA 141  K 3.9  CL 109  CO2 26  BUN 5*  CREATININE 0.86  GLUCOSE 185*   No results for input(s): LABPT, INR in the last 72 hours.   Assessment/Plan: 5 Days Post-Op Procedure(s) (LRB): L5 -S1 BILATERAL T LIF (Bilateral) Anemia, due to perioperative blood loss, start iron Advance diet Up with therapy Discharge to SNF  [name] 06/30/2017, 8:24 AM Patient ID: [name], male   DOB: 08-22-1956, 61 y.o.   MRN: [id number]

## 2017-06-30 NOTE — Progress Notes (Signed)
Attempted to give patient tylenol for pain management. Patient asking what medication is being given and was told tylenol. Patient become angry, stating that he cannot take this medication because of the side effect of upsetting the stomach.  Notified patient that he has not had any other pain medication since back surgery. Patient now angry that he feels tricked and lied to.  Patient began to use foul language and raised voice at nurse. Notified leadership.  Left message at Dr. Otho Ket office for an alternative med. Patient is refusing to take any of morning medications until pain medication is changed. Wendee Copp

## 2017-06-30 NOTE — Telephone Encounter (Signed)
I called and spoke with heather she states that she has already spoke with Dr. Louanne Skye

## 2017-07-01 ENCOUNTER — Ambulatory Visit: Payer: Medicare Other | Admitting: Neurology

## 2017-07-02 ENCOUNTER — Encounter: Payer: Self-pay | Admitting: Neurology

## 2017-07-05 ENCOUNTER — Encounter (HOSPITAL_COMMUNITY): Payer: Self-pay | Admitting: Specialist

## 2017-07-08 NOTE — ED Provider Notes (Signed)
[facility] DEPT Provider Note   CSN: [id number] Arrival date & time: 06/24/17  1339     History   Chief Complaint Chief Complaint  Patient presents with  . Urinary Incontinence    HPI [name] is a 61 y.o. male. Chief complaint is leg weakness, incontinence  HPI [name] is a 62 year old male with previous lumbosacral sacral interbody fusion by Dr.Tooke. He's had progressive weakness of his bilateral legs and urinary incontinence since a fall several weeks ago. At that time he had normal plain films. Seen in evaluation and follow-up at [facility]. He was describing the incontinence and was referred here for further evaluation.  He can walk short distances less than 100 feet. Occasional unpredictable incontinence. He did void yesterday spontaneously. He states he feels urge at times to urinate but at times will have incontinence. Had normal bowel movement yesterday has not had retention or incontinence of stool.  Past Medical History:  Diagnosis Date  . Anxiety   . Bipolar disorder ([place])   . Chronic back pain   . Depression   . Diabetes mellitus without complication ([place])   . Diabetic neuropathy ([place])   . DVT (deep venous thrombosis) ([place])   . Enlarged prostate   . Headache   . Hepatitis    Hepatitis C - has been treated with Harvoni (cleared in July, 2017)  . History of colon polyps   . Mental disorder    bipolar  . Peripheral vascular disease ([place])   . Pneumonia    hx of-about 47yrs ago  . Schizophrenia [facility])     Patient Active Problem List   Diagnosis Date Noted  . Anemia due to blood loss 06/30/2017    Class: Acute  . Diabetes mellitus without complication ([place]) 12/75/1700    Class: Chronic  . Spinal stenosis of lumbar region 06/29/2017    Class: Chronic  . Cauda equina compression ([place]) 06/29/2017    Class: Acute  . Spondylolisthesis, lumbar region 06/29/2017    Class: Chronic  . History of lumbar spinal fusion 06/26/2017  . Myelopathy  ([place]) 06/24/2017  . Low back pain 06/24/2017  . NPH (normal pressure hydrocephalus) 10/30/2016  . Normal pressure hydrocephalus 09/01/2016  . Bilateral sciatica 09/01/2016  . Altered mental status 07/01/2016  . Acute encephalopathy 07/01/2016  . Hydrocephalus in adult 07/01/2016  . Shaking spells 06/01/2016  . Gait disturbance 04/10/2015  . Numbness 04/10/2015  . Lumbar radicular pain 04/10/2015  . Lumbar spondylosis 04/10/2015  . Bipolar I disorder with depression, severe ([place]) 06/30/2014  . Suicidal ideations 06/30/2014  . Ulcer of lower limb, unspecified 02/20/2014  . CELLULITIS AND ABSCESS OF LEG EXCEPT FOOT 11/08/2007    Past Surgical History:  Procedure Laterality Date  . APPENDECTOMY     early 82's  . BACK SURGERY  05/2011   x 5  . blood clot in groin  early 80's  . CARPAL TUNNEL RELEASE     left  . CERVICAL SPINE SURGERY  2009  . COLONOSCOPY    . HERNIA REPAIR     early 2000's  . SHOULDER SURGERY  70's   right  . VENTRICULOPERITONEAL SHUNT N/A 10/30/2016   Procedure: Right Ventricular Peritoneal Shunt;  Surgeon: Kary Kos, MD;  Location: [facility];  Service: Neurosurgery;  Laterality: N/A;       Home Medications    Prior to Admission medications   Medication Sig Start Date End Date Taking? Authorizing Provider  cholecalciferol (VITAMIN D) 1000 units tablet Take  1,000 Units by mouth daily.   Yes [provider]  gabapentin (NEURONTIN) 300 MG capsule Take 300 mg by mouth at bedtime.  06/11/17  Yes [provider]  metFORMIN (GLUCOPHAGE) 500 MG tablet Take 500 mg by mouth daily with breakfast.    Yes [provider]  paliperidone (INVEGA) 3 MG 24 hr tablet Take 3 mg by mouth at bedtime.  04/22/17  Yes [provider]  QUEtiapine (SEROQUEL) 400 MG tablet Take 800 mg by mouth at bedtime. 05/14/17  Yes [provider]  risperiDONE (RISPERDAL) 1 MG tablet Take 1 tablet (1 mg total) by mouth 2 (two) times daily. 07/05/16  Yes  Elgergawy, Silver Huguenin, MD  sertraline (ZOLOFT) 100 MG tablet Take 100 mg by mouth at bedtime.   Yes [provider]  sitaGLIPtin (JANUVIA) 100 MG tablet Take 100 mg by mouth daily with breakfast.   Yes [provider]  tamsulosin (FLOMAX) 0.4 MG CAPS capsule Take 0.4 mg by mouth daily after supper.    Yes [provider]  traZODone (DESYREL) 150 MG tablet Take 150 mg by mouth at bedtime. 04/22/17  Yes [provider]  celecoxib (CELEBREX) 200 MG capsule Take 1 capsule (200 mg total) by mouth daily. 06/29/17   [name], MD  diazepam (VALIUM) 2 MG tablet Take 1 tablet (2 mg total) by mouth every 8 (eight) hours as needed for muscle spasms. 06/29/17   [name], MD  docusate sodium (COLACE) 100 MG capsule Take 1 capsule (100 mg total) by mouth 2 (two) times daily. 06/29/17   [name], MD  ferrous gluconate (FERGON) 324 MG tablet Take 1 tablet (324 mg total) by mouth 2 (two) times daily with a meal. 06/30/17   [name], MD  gabapentin (NEURONTIN) 300 MG capsule Take 1 capsule (300 mg total) by mouth at bedtime. 06/29/17   [name], MD  oxyCODONE-acetaminophen (PERCOCET/ROXICET) 5-325 MG tablet Take 1-2 tablets by mouth every 6 (six) hours as needed for severe pain. 06/29/17   [name], MD  paliperidone (INVEGA) 3 MG 24 hr tablet Take 1 tablet (3 mg total) by mouth at bedtime. 06/29/17   [name], MD  QUEtiapine (SEROQUEL) 400 MG tablet Take 2 tablets (800 mg total) by mouth at bedtime. 06/29/17   [name], MD  traZODone (DESYREL) 150 MG tablet Take 1 tablet (150 mg total) by mouth at bedtime. 06/29/17   [name], MD    Family History Family History  Problem Relation Age of Onset  . Heart attack Father   . Anesthesia problems Neg Hx   . Hypotension Neg Hx   . Malignant hyperthermia Neg Hx   . Pseudochol deficiency Neg Hx     Social History Social History  Substance Use Topics  . Smoking status: Current Every Day  Smoker    Packs/day: 1.00    Years: 40.00    Types: Cigarettes  . Smokeless tobacco: Never Used     Comment: pt states that he is ready to quit smoking but thinks he will need patches advised him to speak with his PCP which he is seaing this Thursday 3/26  . Alcohol use No     Allergies   Penicillins   Review of Systems Review of Systems  Constitutional: Negative for appetite change, chills, diaphoresis, fatigue and fever.  HENT: Negative for mouth sores, sore throat and trouble swallowing.   Eyes: Negative for visual disturbance.  Respiratory:  Negative for cough, chest tightness, shortness of breath and wheezing.   Cardiovascular: Negative for chest pain.  Gastrointestinal: Negative for abdominal distention, abdominal pain, diarrhea, nausea and vomiting.  Endocrine: Negative for polydipsia, polyphagia and polyuria.  Genitourinary: Negative for dysuria, frequency and hematuria.       Urinary incontinence  Musculoskeletal: Positive for back pain. Negative for gait problem.  Skin: Negative for color change, pallor and rash.  Neurological: Positive for weakness. Negative for dizziness, syncope, light-headedness and headaches.  Hematological: Does not bruise/bleed easily.  Psychiatric/Behavioral: Negative for behavioral problems and confusion.     Physical Exam Updated Vital Signs BP 124/66 (BP Location: Right Arm)   Pulse 92   Temp 98.1 F (36.7 C) (Oral)   Resp 20   Ht 6' (1.829 m)   Wt 86.2 kg (190 lb)   SpO2 99%   BMI 25.77 kg/m   Physical Exam  Constitutional: He is oriented to person, place, and time. He appears well-developed and well-nourished. No distress.  HENT:  Head: Normocephalic.  Eyes: Pupils are equal, round, and reactive to light. Conjunctivae are normal. No scleral icterus.  Neck: Normal range of motion. Neck supple. No thyromegaly present.  Cardiovascular: Normal rate and regular rhythm.  Exam reveals no gallop and no friction rub.   No murmur  heard. Pulmonary/Chest: [place] normal and breath sounds normal. No respiratory distress. He has no wheezes. He has no rales.  Abdominal: Soft. Bowel sounds are normal. He exhibits no distension. There is no tenderness. There is no rebound.  Musculoskeletal: Normal range of motion.  Neurological: He is alert and oriented to person, place, and time.  He has intact rectal tone. He has pain with range of motion of the legs at the hips and knees. He has no frank unilateral loss of strength.Normal symmetric Strength to shoulder shrug, triceps, biceps, grip,wrist flex/extend,and intrinsics  Norma lsymmetric sensation above and below clavicles, and to all distributions to UEs. Norma symmetric strength to flex/.extend hip and knees, dorsi/plantar flex ankles. Normal symmetric sensation to all distributions to LEs Patellar and achilles reflexes 1-2+. Downgoing Babinski   Skin: Skin is warm and dry. No rash noted.  Psychiatric: He has a normal mood and affect. His behavior is normal.     ED Treatments / Results  Labs (all labs ordered are listed, but only abnormal results are displayed) Labs Reviewed  COMPREHENSIVE METABOLIC PANEL - Abnormal; Notable for the following:       Result Value   Glucose, Bld 119 (*)    ALT 12 (*)    All other components within normal limits  CBC WITH DIFFERENTIAL/PLATELET - Abnormal; Notable for the following:    RBC 4.21 (*)    HCT 38.4 (*)    RDW 11.4 (*)    All other components within normal limits  GLUCOSE, CAPILLARY - Abnormal; Notable for the following:    Glucose-Capillary 167 (*)    All other components within normal limits  CBC - Abnormal; Notable for the following:    WBC 11.3 (*)    RBC 3.65 (*)    Hemoglobin 11.3 (*)    HCT 33.6 (*)    All other components within normal limits  BASIC METABOLIC PANEL - Abnormal; Notable for the following:    Glucose, Bld 215 (*)    Calcium 8.8 (*)    All other components within normal limits  GLUCOSE, CAPILLARY -  Abnormal; Notable for the following:    Glucose-Capillary 205 (*)    All  other components within normal limits  GLUCOSE, CAPILLARY - Abnormal; Notable for the following:    Glucose-Capillary 170 (*)    All other components within normal limits  GLUCOSE, CAPILLARY - Abnormal; Notable for the following:    Glucose-Capillary 160 (*)    All other components within normal limits  CBC - Abnormal; Notable for the following:    WBC 12.4 (*)    RBC 3.05 (*)    Hemoglobin 9.4 (*)    HCT 28.1 (*)    All other components within normal limits  BASIC METABOLIC PANEL - Abnormal; Notable for the following:    Glucose, Bld 239 (*)    Calcium 8.6 (*)    All other components within normal limits  GLUCOSE, CAPILLARY - Abnormal; Notable for the following:    Glucose-Capillary 195 (*)    All other components within normal limits  GLUCOSE, CAPILLARY - Abnormal; Notable for the following:    Glucose-Capillary 241 (*)    All other components within normal limits  GLUCOSE, CAPILLARY - Abnormal; Notable for the following:    Glucose-Capillary 159 (*)    All other components within normal limits  GLUCOSE, CAPILLARY - Abnormal; Notable for the following:    Glucose-Capillary 183 (*)    All other components within normal limits  GLUCOSE, CAPILLARY - Abnormal; Notable for the following:    Glucose-Capillary 244 (*)    All other components within normal limits  GLUCOSE, CAPILLARY - Abnormal; Notable for the following:    Glucose-Capillary 214 (*)    All other components within normal limits  CBC - Abnormal; Notable for the following:    WBC 10.6 (*)    RBC 2.90 (*)    Hemoglobin 9.0 (*)    HCT 26.7 (*)    RDW 11.3 (*)    All other components within normal limits  COMPREHENSIVE METABOLIC PANEL - Abnormal; Notable for the following:    Glucose, Bld 185 (*)    BUN 5 (*)    Calcium 8.8 (*)    Total Protein 6.0 (*)    Albumin 2.8 (*)    ALT 15 (*)    All other components within normal limits  GLUCOSE,  CAPILLARY - Abnormal; Notable for the following:    Glucose-Capillary 125 (*)    All other components within normal limits  GLUCOSE, CAPILLARY - Abnormal; Notable for the following:    Glucose-Capillary 176 (*)    All other components within normal limits  GLUCOSE, CAPILLARY - Abnormal; Notable for the following:    Glucose-Capillary 214 (*)    All other components within normal limits  GLUCOSE, CAPILLARY - Abnormal; Notable for the following:    Glucose-Capillary 199 (*)    All other components within normal limits  GLUCOSE, CAPILLARY - Abnormal; Notable for the following:    Glucose-Capillary 214 (*)    All other components within normal limits  GLUCOSE, CAPILLARY - Abnormal; Notable for the following:    Glucose-Capillary 276 (*)    All other components within normal limits  GLUCOSE, CAPILLARY - Abnormal; Notable for the following:    Glucose-Capillary 250 (*)    All other components within normal limits  GLUCOSE, CAPILLARY - Abnormal; Notable for the following:    Glucose-Capillary 287 (*)    All other components within normal limits  GLUCOSE, CAPILLARY - Abnormal; Notable for the following:    Glucose-Capillary 246 (*)    All other components within normal limits  GLUCOSE, CAPILLARY - Abnormal; Notable for the following:  Glucose-Capillary 377 (*)    All other components within normal limits  SURGICAL PCR SCREEN  HEMOGLOBIN A1C    EKG  EKG Interpretation None       Radiology No results found.  Procedures Procedures (including critical care time)  Medications Ordered in ED Medications  methocarbamol (ROBAXIN) 500 MG tablet (  Not Given 06/26/17 0417)  ondansetron (ZOFRAN) injection 4 mg (4 mg Intravenous Given 06/24/17 1804)  gabapentin (NEURONTIN) capsule 300 mg (300 mg Oral Given 06/26/17 0843)  bupivacaine liposome (EXPAREL) 1.3 % injection 266 mg (20 mLs Infiltration Given 06/25/17 2228)  clindamycin (CLEOCIN) 900 MG/50ML IVPB (  Override pull for Anesthesia  06/25/17 2204)  clindamycin (CLEOCIN) IVPB 600 mg (0 mg Intravenous Stopped 06/26/17 2314)  magnesium citrate solution 0.5 Bottle (0.5 Bottles Oral Given 06/28/17 1625)  bisacodyl (DULCOLAX) suppository 10 mg (10 mg Rectal Given 06/28/17 1626)     Initial Impression / Assessment and Plan / ED Course  I have reviewed the triage vital signs and the nursing notes.  Pertinent labs & imaging results that were available during my care of the patient were reviewed by me and considered in my medical decision making (see chart for details).     Incontinence. Has history of normal pressure hydrocephalus. Has otherwise normal neurological exam does not have confusion. His ataxic gait is likely secondary to pain. Thus doubt exacerbation of NPH.  I'm unable to determine if the patient's VP shunt is compatible with MRI. I discussed the case with Dr. Legrand Como Cu. Dr. Sherrian Divers as always return my call immediately. He requests admission for further studies and evaluation he will evaluate the patient in the emergency room.  Final Clinical Impressions(s) / ED Diagnoses   Final diagnoses:  Myelopathy [facility])  Surgery, elective  Distended abdomen    New Prescriptions Discharge Medication List as of 06/30/2017  1:48 PM    START taking these medications   Details  celecoxib (CELEBREX) 200 MG capsule Take 1 capsule (200 mg total) by mouth daily., Starting Tue 06/29/2017, Normal    docusate sodium (COLACE) 100 MG capsule Take 1 capsule (100 mg total) by mouth 2 (two) times daily., Starting Tue 06/29/2017, Normal    ferrous gluconate (FERGON) 324 MG tablet Take 1 tablet (324 mg total) by mouth 2 (two) times daily with a meal., Starting Wed 06/30/2017, Normal         Tanna Furry, MD 07/08/17 [phone]

## 2017-07-23 ENCOUNTER — Telehealth (INDEPENDENT_AMBULATORY_CARE_PROVIDER_SITE_OTHER): Payer: Self-pay | Admitting: Specialist

## 2017-07-23 ENCOUNTER — Ambulatory Visit (INDEPENDENT_AMBULATORY_CARE_PROVIDER_SITE_OTHER): Payer: Medicare Other | Admitting: Specialist

## 2017-07-23 ENCOUNTER — Encounter (INDEPENDENT_AMBULATORY_CARE_PROVIDER_SITE_OTHER): Payer: Self-pay | Admitting: Specialist

## 2017-07-23 ENCOUNTER — Ambulatory Visit (INDEPENDENT_AMBULATORY_CARE_PROVIDER_SITE_OTHER): Payer: Medicare Other

## 2017-07-23 VITALS — BP 147/83 | HR 104

## 2017-07-23 DIAGNOSIS — M545 Low back pain: Secondary | ICD-10-CM | POA: Diagnosis not present

## 2017-07-23 DIAGNOSIS — G8929 Other chronic pain: Secondary | ICD-10-CM | POA: Diagnosis not present

## 2017-07-23 DIAGNOSIS — Z981 Arthrodesis status: Secondary | ICD-10-CM

## 2017-07-23 IMAGING — CT CT HEAD W/O CM
4 series · 16 of 47 positions shown, 18 images · non-contrast
Comparison: 01/05/2017 and 07/01/2016

CLINICAL DATA: Headache.  Shunt.

EXAM:
CT HEAD WITHOUT CONTRAST
TECHNIQUE: Contiguous axial images were obtained from the base of the skull
through the vertex without intravenous contrast.

[Series 3: head bone · axial · 0.46mm/px · z∈[-70,-34]mm · 3 of 88 slices shown]
[im 9/88  bone]
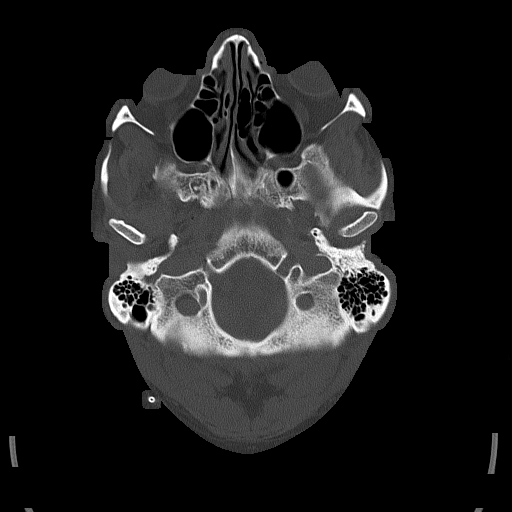
[im 18/88  bone]
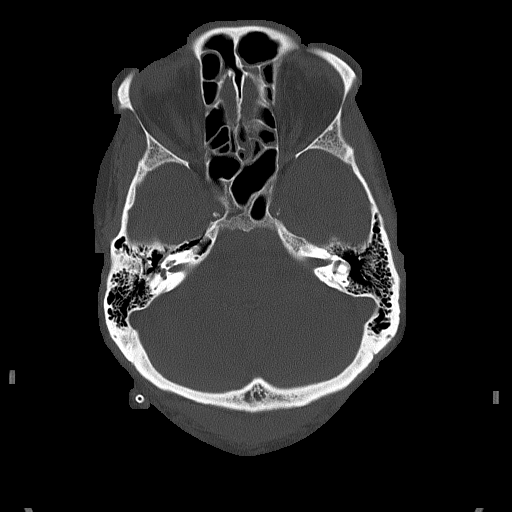
[im 27/88  bone]
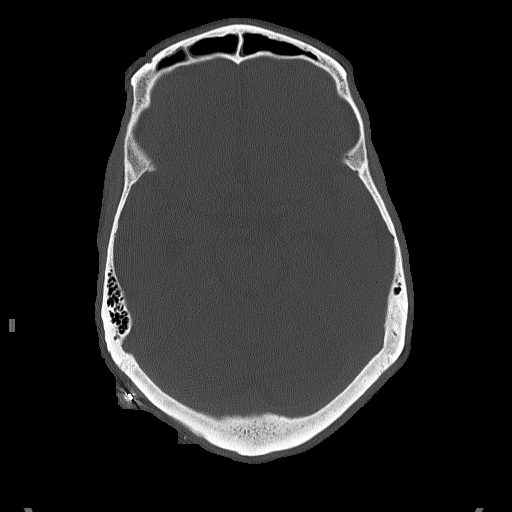

[Series 4: head without · axial · non-contrast · 0.46mm/px · z∈[-66,+59]mm · 7 of 35 slices shown, 9 images]
[im 5/35  brain]
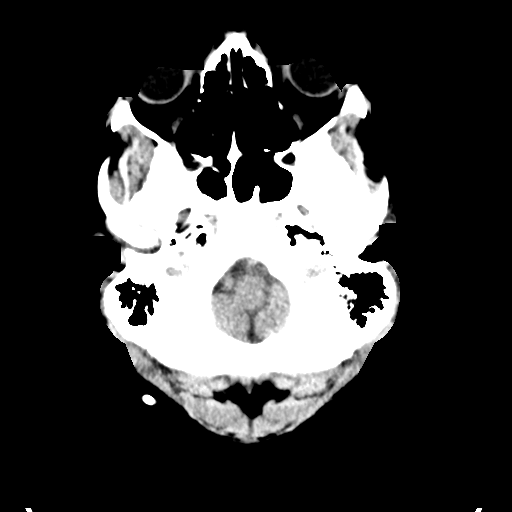
[im 5/35  bone]
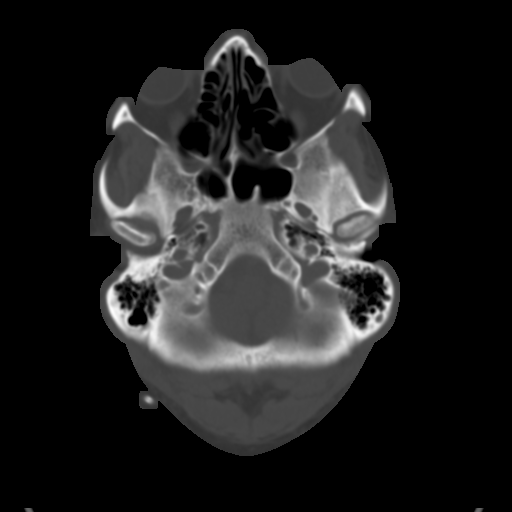
[im 9/35  brain]
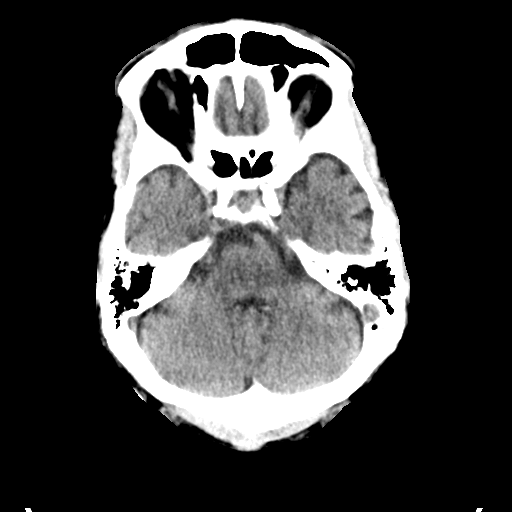
[im 13/35  brain]
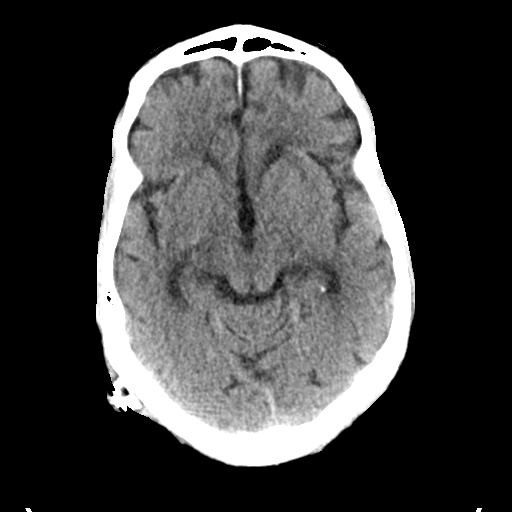
[im 18/35  brain]
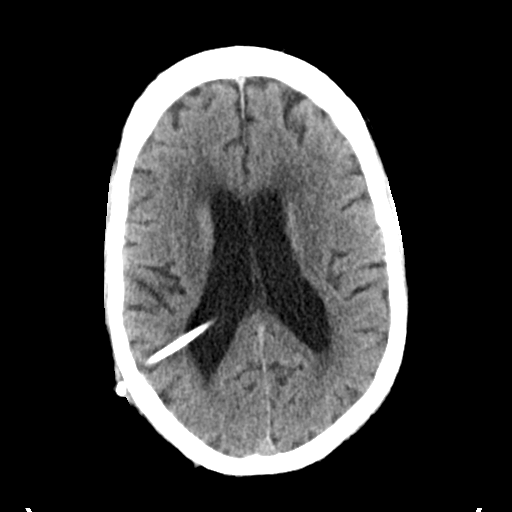
[im 22/35  brain]
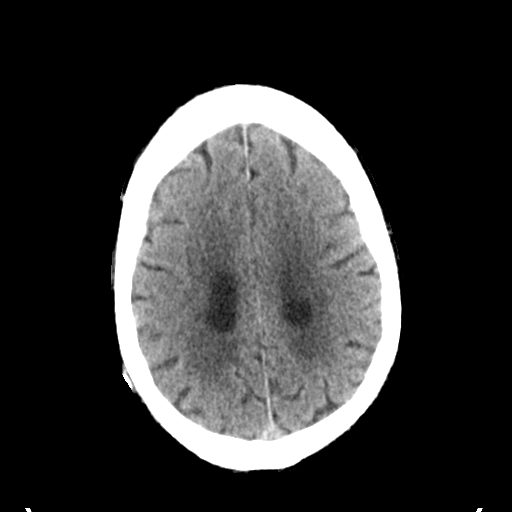
[im 22/35  bone]
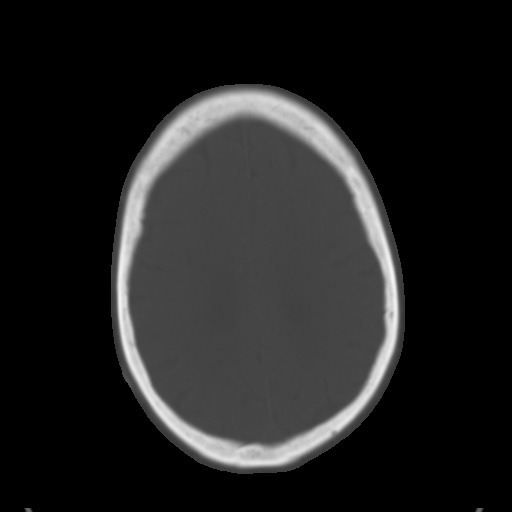
[im 26/35  brain]
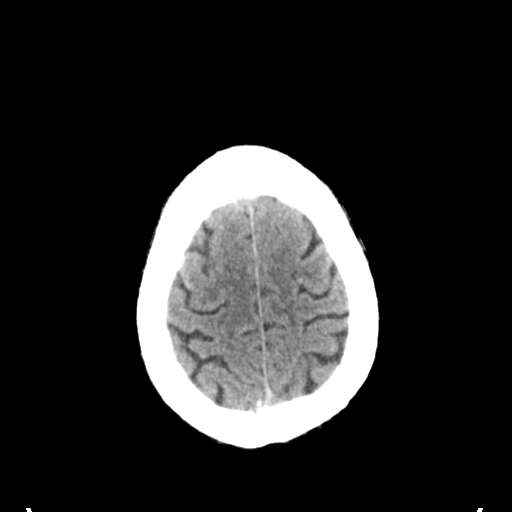
[im 30/35  brain]
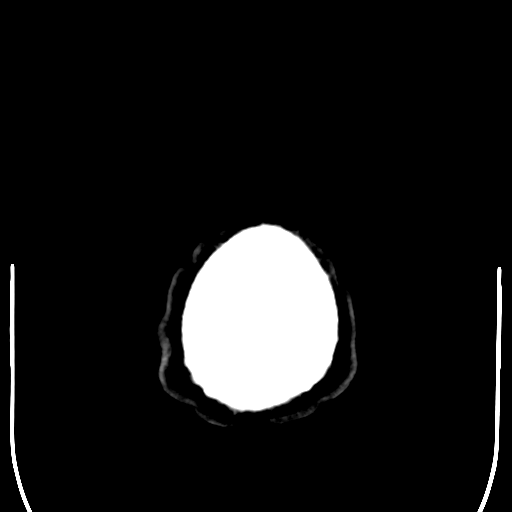

[Series 5: head without cor · coronal · non-contrast · 0.32mm/px · 3 of 67 slices shown]
[im 23/67  brain]
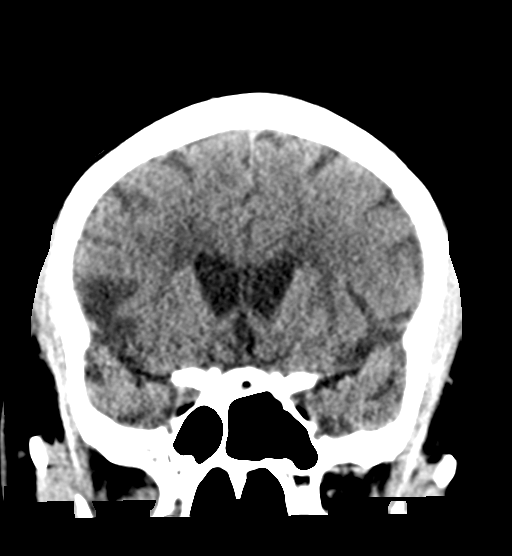
[im 30/67  brain]
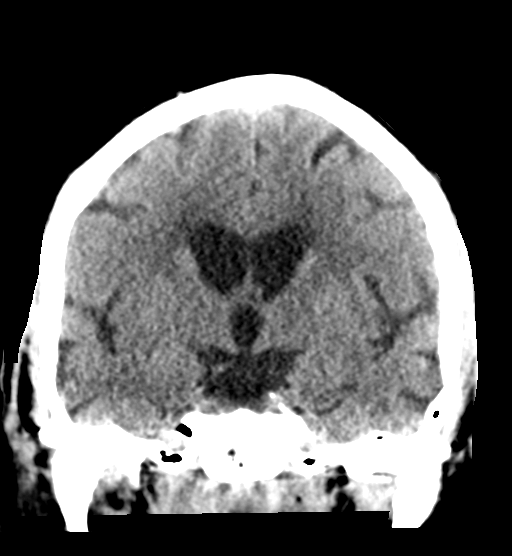
[im 37/67  brain]
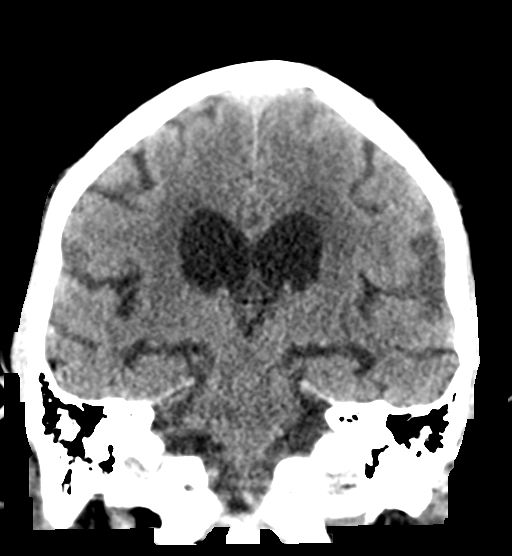

[Series 6: head without sag · sagittal · non-contrast · 0.34mm/px · 3 of 67 slices shown]
[im 23/67  brain]
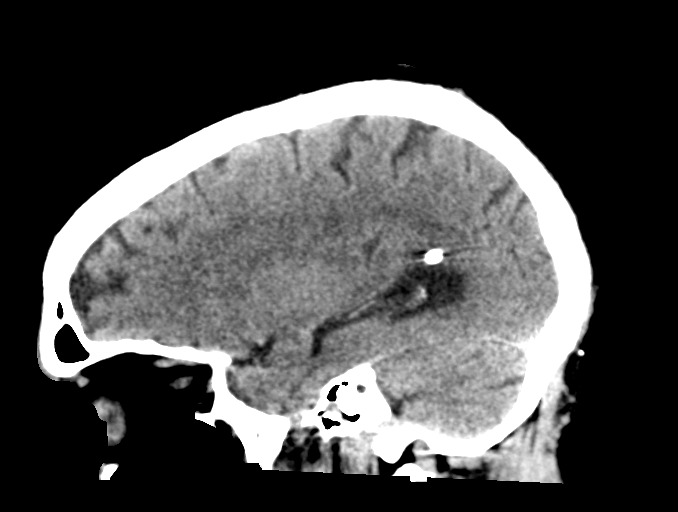
[im 34/67  brain]
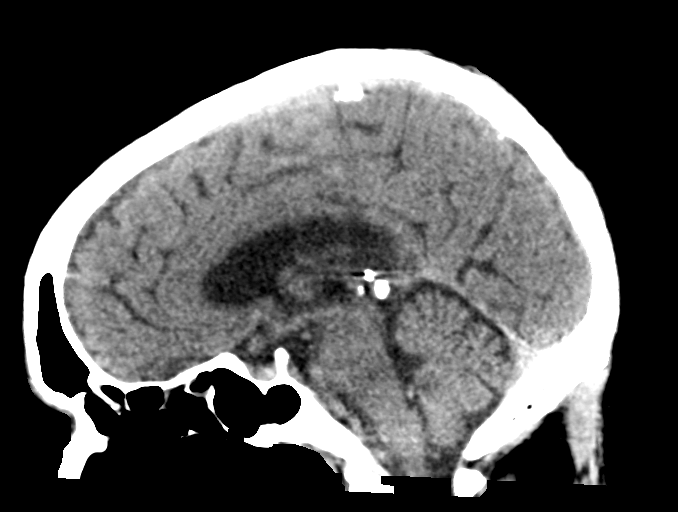
[im 45/67  brain]
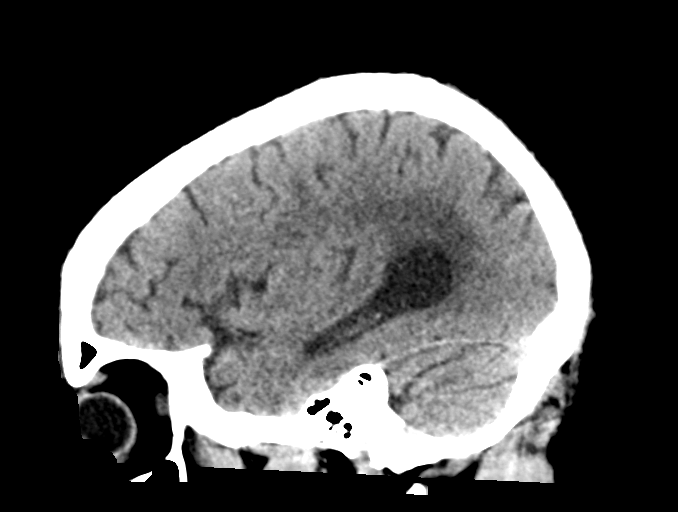

[16 of 47 positions shown; findings below may reference images not displayed]

FINDINGS: Brain: Right posterior parietal ventriculostomy catheter with tip
adjacent the midline unchanged. Ventricles, cisterns and other CSF
spaces are within normal. There is chronic ischemic microvascular
disease. There is no mass, mass effect, shift of midline structures
or acute hemorrhage. No evidence of acute infarction. Visualized
portions of the shunt are intact.

Vascular: Calcified plaque over the cavernous segment of the
internal carotid arteries bilaterally.

Skull: Within normal.

Sinuses/Orbits: Subtle mucosal membrane thickening involving the
frontal, ethmoid and sphenoid sinuses. Orbits are normal.

Other: None.
IMPRESSION: No acute intracranial findings.

Right posterior parietal ventriculostomy catheter unchanged.

Chronic ischemic microvascular disease.

## 2017-07-23 MED ORDER — OXYCODONE-ACETAMINOPHEN 5-325 MG PO TABS: 1.0000 | ORAL_TABLET | Freq: Four times a day (QID) | ORAL | 0 refills | Status: DC | PRN

## 2017-07-23 NOTE — Telephone Encounter (Signed)
Please advise 

## 2017-07-23 NOTE — Telephone Encounter (Signed)
[name], patient's care taker would like to know if there is a Urologist that Dr. Louanne Skye can refer him to in [place].  CX#[phone]

## 2017-07-23 NOTE — Progress Notes (Addendum)
Post-Op Visit Note   Patient: [name]           Date of Birth: 1956-01-22           MRN: [id number] Visit Date: 07/23/2017 PCP: Nolene Ebbs, MD   Assessment & Plan: 4 weeks post L5-S1 extension of L2-L5 fusion,   Chief Complaint:  Chief Complaint  Patient presents with  . Lower Back - Routine Post Op  Still complaining of urinary incontinence, he had severe lumbar spinal stenosis at L5-S1,true loss of bladder with Ability to walk due to lumbar disease is unusual, he also had a normal  Pressure hydrocephalus which could Relate to his persisting problems with incontinence and also ataxia.   Visit Diagnoses:  1. Chronic bilateral low back pain without sciatica   Incision lumbar spine is healed, no drainage or fluctuance. Strength in both legs is normal  SLR is negative Radiographs without abnormality post extension of lumbar fusion from L2-5 to L2-S1.  Plan:    No lifting greater than 10 lbs. Avoid bending, stooping and twisting. Use brace when sitting and out of bed even to go to bathroom. Walk slowly increasing distances up to one mile by 4-6 weeks post op. May shower and change dressing following bathing with shower.When bathing remove the brace shower and replace brace before getting out of the shower. May bathe the incision. Please call and return for scheduled follow up appointment 2 weeks from the time of surgery. Urology appointment for eval of urinary incontinence, Upper motor neuron vs lower motor neuron spastic vs flaccid paralysis   Follow-Up Instructions: No Follow-up on file.   Orders:  Orders Placed This Encounter  Procedures  . XR Lumbar Spine 2-3 Views   No orders of the defined types were placed in this encounter.   Imaging: No results found.  PMFS History: Patient Active Problem List   Diagnosis Date Noted  . Spinal stenosis of lumbar region 06/29/2017    Priority: High    Class: Chronic  . Cauda equina compression ([place])  06/29/2017    Priority: High    Class: Acute  . Spondylolisthesis, lumbar region 06/29/2017    Priority: High    Class: Chronic  . Anemia due to blood loss 06/30/2017    Priority: Medium    Class: Acute  . Diabetes mellitus without complication ([place]) [id number]/90/3009    Priority: Medium    Class: Chronic  . History of lumbar spinal fusion 06/26/2017  . Myelopathy ([place]) 06/24/2017  . Low back pain 06/24/2017  . NPH (normal pressure hydrocephalus) 10/30/2016  . Normal pressure hydrocephalus 09/01/2016  . Bilateral sciatica 09/01/2016  . Altered mental status 07/01/2016  . Acute encephalopathy 07/01/2016  . Hydrocephalus in adult 07/01/2016  . Shaking spells 06/01/2016  . Gait disturbance 04/10/2015  . Numbness 04/10/2015  . Lumbar radicular pain 04/10/2015  . Lumbar spondylosis 04/10/2015  . Bipolar I disorder with depression, severe ([place]) 06/30/2014  . Suicidal ideations 06/30/2014  . Ulcer of lower limb, unspecified 02/20/2014  . CELLULITIS AND ABSCESS OF LEG EXCEPT FOOT 11/08/2007   Past Medical History:  Diagnosis Date  . Anxiety   . Bipolar disorder ([place])   . Chronic back pain   . Depression   . Diabetes mellitus without complication ([place])   . Diabetic neuropathy ([place])   . DVT (deep venous thrombosis) ([place])   . Enlarged prostate   . Headache   . Hepatitis    Hepatitis C - has been treated with Harvoni (  cleared in July, 2017)  . History of colon polyps   . Mental disorder    bipolar  . Peripheral vascular disease ([place])   . Pneumonia    hx of-about 16yrs ago  . Schizophrenia ([place])     Family History  Problem Relation Age of Onset  . Heart attack Father   . Anesthesia problems Neg Hx   . Hypotension Neg Hx   . Malignant hyperthermia Neg Hx   . Pseudochol deficiency Neg Hx     Past Surgical History:  Procedure Laterality Date  . APPENDECTOMY     early 7's  . BACK SURGERY  05/2011   x 5  . blood clot in groin  early 80's  . CARPAL TUNNEL RELEASE     left  .  CERVICAL SPINE SURGERY  2009  . COLONOSCOPY    . HERNIA REPAIR     early 2000's  . SHOULDER SURGERY  70's   right  . VENTRICULOPERITONEAL SHUNT N/A 10/30/2016   Procedure: Right Ventricular Peritoneal Shunt;  Surgeon: Kary Kos, MD;  Location: [facility];  Service: Neurosurgery;  Laterality: N/A;   Social History   Occupational History  . Not on file.   Social History Main Topics  . Smoking status: Current Every Day Smoker    Packs/day: 1.00    Years: 40.00    Types: Cigarettes  . Smokeless tobacco: Never Used     Comment: pt states that he is ready to quit smoking but thinks he will need patches advised him to speak with his PCP which he is seaing this Thursday 3/26  . Alcohol use No  . Drug use: No  . Sexual activity: Yes

## 2017-07-23 NOTE — Patient Instructions (Addendum)
No lifting greater than 10 lbs. Avoid bending, stooping and twisting. Use brace when sitting and out of bed even to go to bathroom. Walk slowly increasing distances up to one mile by 4-6 weeks post op. May shower and change dressing following bathing with shower.When bathing remove the brace shower and replace brace before getting out of the shower. May bathe the incision. Please call and return for scheduled follow up appointment 2 weeks from the time of surgery. Urology appointment for eval of urinary incontinence, Upper motor neuron vs lower motor neuron spastic vs flaccid paralysis

## 2017-08-12 ENCOUNTER — Telehealth (INDEPENDENT_AMBULATORY_CARE_PROVIDER_SITE_OTHER): Payer: Self-pay

## 2017-08-12 DIAGNOSIS — Z981 Arthrodesis status: Secondary | ICD-10-CM

## 2017-08-12 NOTE — Telephone Encounter (Signed)
Patient would like a Rx refill on Oxycodone.  Cb# is [id number].  Please advise.  Thank You.

## 2017-08-12 NOTE — Addendum Note (Signed)
Addended by: Minda Ditto, Geoffery Spruce on: 08/12/2017 05:14 PM   Modules accepted: Orders

## 2017-08-17 ENCOUNTER — Other Ambulatory Visit (INDEPENDENT_AMBULATORY_CARE_PROVIDER_SITE_OTHER): Payer: Self-pay | Admitting: Specialist

## 2017-08-17 MED ORDER — HYDROCODONE-ACETAMINOPHEN 5-325 MG PO TABS: 1.0000 | ORAL_TABLET | Freq: Three times a day (TID) | ORAL | 0 refills | Status: DC | PRN

## 2017-08-17 NOTE — Telephone Encounter (Signed)
Call [facility] and schedule an appointment, the urologists there are very capable and he should be schedule an appointment that can be obtained sooner rather than later. jen

## 2017-08-17 NOTE — Telephone Encounter (Signed)
I reviewed the [facility] website and [name] received #30 tablets of oxycodone on 8/22, then filled our Rx for #50 on 8/24. Total of 80 tablets received in 3 days. It is likely that he would have require a new RX within one week of his office visit post op surgery performed on 06/25/2017. We will change to hydrocodone 3 times a day #30 and discontinue over the next 2 weeks.

## 2017-08-18 NOTE — Telephone Encounter (Signed)
I called and lmom for [name] to let me know what the patient needs referral for and I would call and make him an appt.

## 2017-08-20 ENCOUNTER — Ambulatory Visit (INDEPENDENT_AMBULATORY_CARE_PROVIDER_SITE_OTHER): Payer: Medicare Other | Admitting: Specialist

## 2017-08-25 NOTE — Telephone Encounter (Signed)
Has not returned my call --- I will schedule if she calls back with the problem

## 2017-08-27 ENCOUNTER — Encounter (HOSPITAL_COMMUNITY): Payer: Self-pay

## 2017-08-27 ENCOUNTER — Emergency Department (HOSPITAL_COMMUNITY): Payer: Medicare Other

## 2017-08-27 ENCOUNTER — Emergency Department (HOSPITAL_COMMUNITY)
Admission: EM | Admit: 2017-08-27 | Discharge: 2017-08-27 | Disposition: A | Discharge status: Home / Self Care | Payer: Medicare Other | Attending: Emergency Medicine | Admitting: Emergency Medicine

## 2017-08-27 ENCOUNTER — Ambulatory Visit (INDEPENDENT_AMBULATORY_CARE_PROVIDER_SITE_OTHER)
Admission: EM | Admit: 2017-08-27 | Discharge: 2017-08-27 | Disposition: A | Discharge status: Nursing Home (Includes ICF) | Payer: Medicare Other | Source: Home / Self Care | Attending: Internal Medicine | Admitting: Internal Medicine

## 2017-08-27 ENCOUNTER — Encounter (HOSPITAL_COMMUNITY): Payer: Self-pay | Admitting: Emergency Medicine

## 2017-08-27 DIAGNOSIS — F419 Anxiety disorder, unspecified: Secondary | ICD-10-CM | POA: Insufficient documentation

## 2017-08-27 DIAGNOSIS — F17210 Nicotine dependence, cigarettes, uncomplicated: Secondary | ICD-10-CM

## 2017-08-27 DIAGNOSIS — R590 Localized enlarged lymph nodes: Secondary | ICD-10-CM

## 2017-08-27 DIAGNOSIS — D500 Iron deficiency anemia secondary to blood loss (chronic): Secondary | ICD-10-CM

## 2017-08-27 DIAGNOSIS — E1151 Type 2 diabetes mellitus with diabetic peripheral angiopathy without gangrene: Secondary | ICD-10-CM

## 2017-08-27 DIAGNOSIS — N50819 Testicular pain, unspecified: Secondary | ICD-10-CM | POA: Insufficient documentation

## 2017-08-27 DIAGNOSIS — F209 Schizophrenia, unspecified: Secondary | ICD-10-CM

## 2017-08-27 DIAGNOSIS — Z794 Long term (current) use of insulin: Secondary | ICD-10-CM

## 2017-08-27 DIAGNOSIS — M549 Dorsalgia, unspecified: Secondary | ICD-10-CM

## 2017-08-27 DIAGNOSIS — R45851 Suicidal ideations: Secondary | ICD-10-CM

## 2017-08-27 DIAGNOSIS — M48061 Spinal stenosis, lumbar region without neurogenic claudication: Secondary | ICD-10-CM | POA: Insufficient documentation

## 2017-08-27 DIAGNOSIS — G8929 Other chronic pain: Secondary | ICD-10-CM | POA: Insufficient documentation

## 2017-08-27 DIAGNOSIS — E1140 Type 2 diabetes mellitus with diabetic neuropathy, unspecified: Secondary | ICD-10-CM | POA: Insufficient documentation

## 2017-08-27 DIAGNOSIS — G959 Disease of spinal cord, unspecified: Secondary | ICD-10-CM | POA: Insufficient documentation

## 2017-08-27 DIAGNOSIS — N50811 Right testicular pain: Secondary | ICD-10-CM

## 2017-08-27 DIAGNOSIS — Z5321 Procedure and treatment not carried out due to patient leaving prior to being seen by health care provider: Secondary | ICD-10-CM | POA: Diagnosis not present

## 2017-08-27 DIAGNOSIS — Z981 Arthrodesis status: Secondary | ICD-10-CM

## 2017-08-27 DIAGNOSIS — Z86010 Personal history of colonic polyps: Secondary | ICD-10-CM | POA: Insufficient documentation

## 2017-08-27 DIAGNOSIS — N400 Enlarged prostate without lower urinary tract symptoms: Secondary | ICD-10-CM

## 2017-08-27 DIAGNOSIS — N509 Disorder of male genital organs, unspecified: Secondary | ICD-10-CM | POA: Diagnosis not present

## 2017-08-27 DIAGNOSIS — N5089 Other specified disorders of the male genital organs: Secondary | ICD-10-CM

## 2017-08-27 DIAGNOSIS — F319 Bipolar disorder, unspecified: Secondary | ICD-10-CM

## 2017-08-27 DIAGNOSIS — M5416 Radiculopathy, lumbar region: Secondary | ICD-10-CM | POA: Insufficient documentation

## 2017-08-27 DIAGNOSIS — G912 (Idiopathic) normal pressure hydrocephalus: Secondary | ICD-10-CM | POA: Insufficient documentation

## 2017-08-27 LAB — COMPREHENSIVE METABOLIC PANEL
ALBUMIN: 3.8 g/dL (ref 3.5–5.0)
ALT: 9 U/L — ABNORMAL LOW (ref 17–63)
ANION GAP: 9 (ref 5–15)
AST: 16 U/L (ref 15–41)
Alkaline Phosphatase: 107 U/L (ref 38–126)
BILIRUBIN TOTAL: 0.5 mg/dL (ref 0.3–1.2)
BUN: 6 mg/dL (ref 6–20)
CALCIUM: 9.7 mg/dL (ref 8.9–10.3)
CO2: 26 mmol/L (ref 22–32)
Chloride: 102 mmol/L (ref 101–111)
Creatinine, Ser: 0.85 mg/dL (ref 0.61–1.24)
GFR calc Af Amer: >60 mL/min (ref >60)
Glucose, Bld: 144 mg/dL — ABNORMAL HIGH (ref 65–99)
Potassium: 3.6 mmol/L (ref 3.5–5.1)
Sodium: 137 mmol/L (ref 135–145)
TOTAL PROTEIN: 8 g/dL (ref 6.5–8.1)

## 2017-08-27 LAB — CBC WITH DIFFERENTIAL/PLATELET
BASOS ABS: 0 10*3/uL (ref 0.0–0.1)
Basophils Relative: 0 %
Eosinophils Absolute: 0.2 10*3/uL (ref 0.0–0.7)
Eosinophils Relative: 2 %
HEMATOCRIT: 36.9 % — AB (ref 39.0–52.0)
Hemoglobin: 12.5 g/dL — ABNORMAL LOW (ref 13.0–17.0)
Lymphocytes Relative: 22 %
Lymphs Abs: 2.9 10*3/uL (ref 0.7–4.0)
MCH: 30.2 pg (ref 26.0–34.0)
MCHC: 33.9 g/dL (ref 30.0–36.0)
MCV: 89.1 fL (ref 78.0–100.0)
MONO ABS: 0.8 10*3/uL (ref 0.1–1.0)
Monocytes Relative: 6 %
NEUTROS ABS: 9.3 10*3/uL — AB (ref 1.7–7.7)
NEUTROS PCT: 70 %
Platelets: 290 10*3/uL (ref 150–400)
RBC: 4.14 MIL/uL — ABNORMAL LOW (ref 4.22–5.81)
RDW: 13.9 % (ref 11.5–15.5)
WBC: 13.3 10*3/uL — ABNORMAL HIGH (ref 4.0–10.5)

## 2017-08-27 LAB — POCT URINALYSIS DIP (DEVICE)
Bilirubin Urine: NEGATIVE
GLUCOSE, UA: NEGATIVE mg/dL
Ketones, ur: NEGATIVE mg/dL
Nitrite: POSITIVE — AB
PROTEIN: NEGATIVE mg/dL
SPECIFIC GRAVITY, URINE: 1.015 (ref 1.005–1.030)
UROBILINOGEN UA: 0.2 mg/dL (ref 0.0–1.0)
pH: 5.5 (ref 5.0–8.0)

## 2017-08-27 NOTE — ED Notes (Signed)
Pt advised he was leaving and would come back tomorrow. Attempted to get pt to stay and he advised he was leaving anyways.

## 2017-08-27 NOTE — ED Provider Notes (Cosign Needed)
Scrotal and testicular US order placed based on patient complaint and UC MD assessment. [place] tech tried to call patient in our waiting room, but was notified by front desk personnel that patient has left. Note in patient's chart at 1729 states patient told the tech he was leaving, despite them trying to convince the patient to stay.   Lorayne Bender, PA-C 08/27/17 1750

## 2017-08-27 NOTE — ED Provider Notes (Signed)
[place]    CSN: [id number] Arrival date & time: 08/27/17  1350     History   Chief Complaint Chief Complaint  Patient presents with  . Testicle Pain    HPI [name] is a 61 y.o. male.   Pt c/o testicular pain x2-3 days. Hurts to move. Pain does not radiate into groin (inguinal region). Denies dysuria but admits to milky yellow discharge. No sexual interaction in nearly 4 years.       Past Medical History:  Diagnosis Date  . Anxiety   . Bipolar disorder ([place])   . Chronic back pain   . Depression   . Diabetes mellitus without complication ([place])   . Diabetic neuropathy ([place])   . DVT (deep venous thrombosis) ([place])   . Enlarged prostate   . Headache   . Hepatitis    Hepatitis C - has been treated with Harvoni (cleared in July, 2017)  . History of colon polyps   . Mental disorder    bipolar  . Peripheral vascular disease ([place])   . Pneumonia    hx of-about 63yrs ago  . Schizophrenia [facility])     Patient Active Problem List   Diagnosis Date Noted  . Anemia due to blood loss 06/30/2017    Class: Acute  . Diabetes mellitus without complication ([place]) 94/85/4627    Class: Chronic  . Spinal stenosis of lumbar region 06/29/2017    Class: Chronic  . Cauda equina compression ([place]) 06/29/2017    Class: Acute  . Spondylolisthesis, lumbar region 06/29/2017    Class: Chronic  . History of lumbar spinal fusion 06/26/2017  . Myelopathy ([place]) 06/24/2017  . Low back pain 06/24/2017  . NPH (normal pressure hydrocephalus) 10/30/2016  . Normal pressure hydrocephalus 09/01/2016  . Bilateral sciatica 09/01/2016  . Altered mental status 07/01/2016  . Acute encephalopathy 07/01/2016  . Hydrocephalus in adult 07/01/2016  . Shaking spells 06/01/2016  . Gait disturbance 04/10/2015  . Numbness 04/10/2015  . Lumbar radicular pain 04/10/2015  . Lumbar spondylosis 04/10/2015  . Bipolar I disorder with depression, severe ([place]) 06/30/2014  . Suicidal ideations  06/30/2014  . Ulcer of lower limb, unspecified 02/20/2014  . CELLULITIS AND ABSCESS OF LEG EXCEPT FOOT 11/08/2007    Past Surgical History:  Procedure Laterality Date  . APPENDECTOMY     early 38's  . BACK SURGERY  05/2011   x 5  . blood clot in groin  early 80's  . CARPAL TUNNEL RELEASE     left  . CERVICAL SPINE SURGERY  2009  . COLONOSCOPY    . HERNIA REPAIR     early 2000's  . SHOULDER SURGERY  70's   right  . VENTRICULOPERITONEAL SHUNT N/A 10/30/2016   Procedure: Right Ventricular Peritoneal Shunt;  Surgeon: Kary Kos, MD;  Location: [place];  Service: Neurosurgery;  Laterality: N/A;       Home Medications    Prior to Admission medications   Medication Sig Start Date End Date Taking? Authorizing Provider  celecoxib (CELEBREX) 200 MG capsule Take 1 capsule (200 mg total) by mouth daily. 06/29/17  Yes [name], MD  cholecalciferol (VITAMIN D) 1000 units tablet Take 1,000 Units by mouth daily.   Yes [provider]  cyclobenzaprine (FLEXERIL) 10 MG tablet TAKE 1 TABLET BY MOUTH 3 TIMES A DAY AS NEEDED FOR MUSCLE SPASMS 06/06/17  Yes [provider]  gabapentin (NEURONTIN) 300 MG capsule Take 300 mg by mouth at bedtime.  06/11/17  Yes [provider]  gabapentin (NEURONTIN) 300 MG capsule Take 1 capsule (300 mg total) by mouth at bedtime. 06/29/17  Yes [name], MD  LEVEMIR FLEXTOUCH 100 UNIT/ML Pen  07/21/17  Yes [provider]  metFORMIN (GLUCOPHAGE) 500 MG tablet Take 500 mg by mouth daily with breakfast.    Yes [provider]  oxyCODONE-acetaminophen (PERCOCET/ROXICET) 5-325 MG tablet Take 1-2 tablets by mouth every 6 (six) hours as needed for severe pain. 07/23/17  Yes [name], MD  paliperidone (INVEGA) 3 MG 24 hr tablet Take 3 mg by mouth at bedtime.  04/22/17  Yes [provider]  paliperidone (INVEGA) 3 MG 24 hr tablet Take 1 tablet (3 mg total) by mouth at bedtime. 06/29/17  Yes [name], MD    QUEtiapine (SEROQUEL) 400 MG tablet Take 800 mg by mouth at bedtime. 05/14/17  Yes [provider]  QUEtiapine (SEROQUEL) 400 MG tablet Take 2 tablets (800 mg total) by mouth at bedtime. 06/29/17  Yes [name], MD  risperiDONE (RISPERDAL) 1 MG tablet Take 1 tablet (1 mg total) by mouth 2 (two) times daily. 07/05/16  Yes Elgergawy, Silver Huguenin, MD  sertraline (ZOLOFT) 100 MG tablet Take 100 mg by mouth at bedtime.   Yes [provider]  sitaGLIPtin (JANUVIA) 100 MG tablet Take 100 mg by mouth daily with breakfast.   Yes [provider]  tamsulosin (FLOMAX) 0.4 MG CAPS capsule Take 0.4 mg by mouth daily after supper.    Yes [provider]  traZODone (DESYREL) 150 MG tablet Take 150 mg by mouth at bedtime. 04/22/17  Yes [provider]  traZODone (DESYREL) 150 MG tablet Take 1 tablet (150 mg total) by mouth at bedtime. 06/29/17  Yes [name], MD  diazepam (VALIUM) 2 MG tablet Take 1 tablet (2 mg total) by mouth every 8 (eight) hours as needed for muscle spasms. 06/29/17   [name], MD  docusate sodium (COLACE) 100 MG capsule Take 1 capsule (100 mg total) by mouth 2 (two) times daily. 06/29/17   [name], MD  EASY TOUCH PEN NEEDLES 29G X 12MM MISC  07/21/17   [provider]  ferrous gluconate (FERGON) 324 MG tablet Take 1 tablet (324 mg total) by mouth 2 (two) times daily with a meal. 06/30/17   [name], MD  HYDROcodone-acetaminophen (NORCO/VICODIN) 5-325 MG tablet Take 1 tablet by mouth 3 (three) times daily as needed for severe pain. 08/17/17   [name], MD  ibuprofen (ADVIL,MOTRIN) 600 MG tablet  05/27/17   [provider]    Family History Family History  Problem Relation Age of Onset  . Heart attack Father   . Anesthesia problems Neg Hx   . Hypotension Neg Hx   . Malignant hyperthermia Neg Hx   . Pseudochol deficiency Neg Hx     Social History Social History  Substance Use Topics  . Smoking status:  Current Every Day Smoker    Packs/day: 1.00    Years: 40.00    Types: Cigarettes  . Smokeless tobacco: Never Used     Comment: pt states that he is ready to quit smoking but thinks he will need patches advised him to speak with his PCP which he is seaing this Thursday 3/26  . Alcohol use No     Allergies   Penicillins   Review of Systems Review of Systems  Constitutional: Negative for chills and fever.  HENT: Negative  for sore throat and tinnitus.   Eyes: Negative for redness.  Respiratory: Negative for cough and shortness of breath.   Cardiovascular: Negative for chest pain and palpitations.  Gastrointestinal: Negative for abdominal pain, diarrhea, nausea and vomiting.  Genitourinary: Positive for testicular pain. Negative for difficulty urinating, dysuria, frequency, genital sores and urgency.  Musculoskeletal: Negative for myalgias.  Skin: Negative for rash.       No lesions  Neurological: Negative for weakness.  Hematological: Does not bruise/bleed easily.  Psychiatric/Behavioral: Negative for suicidal ideas.     Physical Exam Triage Vital Signs ED Triage Vitals  Enc Vitals Group     BP 08/27/17 1449 124/65     Pulse Rate 08/27/17 1449 (!) 110     Resp 08/27/17 1449 20     Temp 08/27/17 1449 99.9 F (37.7 C)     Temp Source 08/27/17 1449 Oral     SpO2 08/27/17 1449 98 %     Weight --      Height --      Head Circumference --      Peak Flow --      Pain Score 08/27/17 1451 10     Pain Loc --      Pain Edu? --      Excl. in [place] [address] [place]? --    No data found.   Updated Vital Signs BP 124/65 (BP Location: Right Arm)   Pulse (!) 110   Temp 99.9 F (37.7 C) (Oral)   Resp 20   SpO2 98%   Visual Acuity Right Eye Distance:   Left Eye Distance:   Bilateral Distance:    Right Eye Near:   Left Eye Near:    Bilateral Near:     Physical Exam  Constitutional: He is oriented to person, place, and time. He appears well-developed and well-nourished. No distress.    HENT:  Head: Normocephalic and atraumatic.  Mouth/Throat: Oropharynx is clear and moist.  Eyes: Pupils are equal, round, and reactive to light. Conjunctivae and EOM are normal. No scleral icterus.  Neck: Normal range of motion. Neck supple. No JVD present. No tracheal deviation present. No thyromegaly present.  Cardiovascular: Normal rate, regular rhythm and normal heart sounds.  Exam reveals no gallop and no friction rub.   No murmur heard. Pulmonary/Chest: Effort normal and breath sounds normal. No respiratory distress.  Abdominal: Soft. Bowel sounds are normal. He exhibits no distension. There is no tenderness.  Genitourinary:     Musculoskeletal: Normal range of motion. He exhibits no edema.  Lymphadenopathy:    He has no cervical adenopathy. No inguinal adenopathy noted on the right side.  Neurological: He is alert and oriented to person, place, and time. No cranial nerve deficit.  Skin: Skin is warm and dry. No rash noted. No erythema.  Psychiatric: He has a normal mood and affect. His behavior is normal. Judgment and thought content normal.     UC Treatments / Results  Labs (all labs ordered are listed, but only abnormal results are displayed) Labs Reviewed  POCT URINALYSIS DIP (DEVICE) - Abnormal; Notable for the following:       Result Value   Hgb urine dipstick MODERATE (*)    Nitrite POSITIVE (*)    Leukocytes, UA SMALL (*)    All other components within normal limits  URINE CYTOLOGY ANCILLARY ONLY    EKG  EKG Interpretation None       Radiology No results found.  Procedures Procedures (including critical care time)  Medications  Ordered in UC Medications - No data to display   Initial Impression / Assessment and Plan / UC Course  I have reviewed the triage vital signs and the nursing notes.  Pertinent labs & imaging results that were available during my care of the patient were reviewed by me and considered in my medical decision making (see chart  for details).     Needs urgent ultrasound of testicles. Screen for GC/Chlam. Discharged to ED.   Final Clinical Impressions(s) / UC Diagnoses   Final diagnoses:  Testicular mass    New Prescriptions Discharge Medication List as of 08/27/2017  3:43 PM       Controlled Substance Prescriptions [place] Controlled Substance Registry consulted? Not Applicable   Harrie Foreman, MD 08/27/17 [phone]

## 2017-08-27 NOTE — ED Triage Notes (Signed)
Here for constant pain on right teste onset 3 days associated w/swelling  Denies inj/trauma, urinary sx  Pt has back brace present upon arrival due to recent back surgery  Brought back on wheel chair... A&O x4... NAD.

## 2017-08-27 NOTE — ED Triage Notes (Signed)
Per pt, Pt is coming from UC with reports of right sided testicle pain that started three days ago. Reports some swelling with some yellow discharge.

## 2017-08-29 ENCOUNTER — Emergency Department (HOSPITAL_COMMUNITY)
Admission: EM | Admit: 2017-08-29 | Discharge: 2017-08-29 | Disposition: A | Discharge status: Home / Self Care | Payer: Medicare Other | Attending: Emergency Medicine | Admitting: Emergency Medicine

## 2017-08-29 ENCOUNTER — Emergency Department (HOSPITAL_COMMUNITY): Payer: Medicare Other

## 2017-08-29 ENCOUNTER — Other Ambulatory Visit (HOSPITAL_COMMUNITY): Payer: Medicare Other

## 2017-08-29 ENCOUNTER — Encounter (HOSPITAL_COMMUNITY): Payer: Self-pay | Admitting: Emergency Medicine

## 2017-08-29 DIAGNOSIS — F99 Mental disorder, not otherwise specified: Secondary | ICD-10-CM | POA: Insufficient documentation

## 2017-08-29 DIAGNOSIS — Z7984 Long term (current) use of oral hypoglycemic drugs: Secondary | ICD-10-CM | POA: Insufficient documentation

## 2017-08-29 DIAGNOSIS — N451 Epididymitis: Secondary | ICD-10-CM | POA: Insufficient documentation

## 2017-08-29 DIAGNOSIS — Z79899 Other long term (current) drug therapy: Secondary | ICD-10-CM | POA: Diagnosis not present

## 2017-08-29 DIAGNOSIS — G8929 Other chronic pain: Secondary | ICD-10-CM | POA: Insufficient documentation

## 2017-08-29 DIAGNOSIS — Z791 Long term (current) use of non-steroidal anti-inflammatories (NSAID): Secondary | ICD-10-CM | POA: Insufficient documentation

## 2017-08-29 DIAGNOSIS — E119 Type 2 diabetes mellitus without complications: Secondary | ICD-10-CM | POA: Insufficient documentation

## 2017-08-29 DIAGNOSIS — F17210 Nicotine dependence, cigarettes, uncomplicated: Secondary | ICD-10-CM | POA: Diagnosis not present

## 2017-08-29 DIAGNOSIS — N50819 Testicular pain, unspecified: Secondary | ICD-10-CM | POA: Diagnosis present

## 2017-08-29 MED ORDER — NAPROXEN 500 MG PO TABS: 500.0000 mg | ORAL_TABLET | Freq: Two times a day (BID) | ORAL | 0 refills | Status: DC

## 2017-08-29 MED ORDER — SULFAMETHOXAZOLE-TRIMETHOPRIM 800-160 MG PO TABS: 1.0000 | ORAL_TABLET | Freq: Two times a day (BID) | ORAL | 0 refills | Status: AC

## 2017-08-29 MED ORDER — LEVOFLOXACIN 500 MG PO TABS: 500.0000 mg | ORAL_TABLET | Freq: Once | ORAL | Status: DC

## 2017-08-29 MED ORDER — AZITHROMYCIN 1 G PO PACK
1.0000 g | PACK | Freq: Once | ORAL | Status: AC
  Administered 2017-08-29: 1 g via ORAL
  Filled 2017-08-29: qty 1

## 2017-08-29 MED ORDER — LEVOFLOXACIN 500 MG PO TABS: 500.0000 mg | ORAL_TABLET | Freq: Every day | ORAL | 0 refills | Status: DC

## 2017-08-29 NOTE — Discharge Instructions (Signed)
Follow-up with your primary care physician if not improving

## 2017-08-29 NOTE — ED Provider Notes (Signed)
[place] DEPT Provider Note   CSN: [id number] Arrival date & time: 08/29/17  1239     History   Chief Complaint Chief Complaint  Patient presents with  . Testicle Pain    HPI [name] is a 61 y.o. male. Chief complaint is testicle pain  HPI:  61 year old male. Reports right testicle pain for the last 5-6 days. He states occasionally will have a "milky discharge". No sexual activity for the last 4 years. Chronic back pain and disability from previous back injury. No history of penile lesions or blistering.  No trauma to the testicle.  Past Medical History:  Diagnosis Date  . Anxiety   . Bipolar disorder ([place])   . Chronic back pain   . Depression   . Diabetes mellitus without complication ([place])   . Diabetic neuropathy ([place])   . DVT (deep venous thrombosis) ([place])   . Enlarged prostate   . Headache   . Hepatitis    Hepatitis C - has been treated with Harvoni (cleared in July, 2017)  . History of colon polyps   . Mental disorder    bipolar  . Peripheral vascular disease ([place])   . Pneumonia    hx of-about 37yrs ago  . Schizophrenia [facility])     Patient Active Problem List   Diagnosis Date Noted  . Anemia due to blood loss 06/30/2017    Class: Acute  . Diabetes mellitus without complication ([place]) 43/32/9518    Class: Chronic  . Spinal stenosis of lumbar region 06/29/2017    Class: Chronic  . Cauda equina compression ([place]) 06/29/2017    Class: Acute  . Spondylolisthesis, lumbar region 06/29/2017    Class: Chronic  . History of lumbar spinal fusion 06/26/2017  . Myelopathy ([place]) 06/24/2017  . Low back pain 06/24/2017  . NPH (normal pressure hydrocephalus) 10/30/2016  . Normal pressure hydrocephalus 09/01/2016  . Bilateral sciatica 09/01/2016  . Altered mental status 07/01/2016  . Acute encephalopathy 07/01/2016  . Hydrocephalus in adult 07/01/2016  . Shaking spells 06/01/2016  . Gait disturbance 04/10/2015  . Numbness 04/10/2015  . Lumbar radicular  pain 04/10/2015  . Lumbar spondylosis 04/10/2015  . Bipolar I disorder with depression, severe ([place]) 06/30/2014  . Suicidal ideations 06/30/2014  . Ulcer of lower limb, unspecified 02/20/2014  . CELLULITIS AND ABSCESS OF LEG EXCEPT FOOT 11/08/2007    Past Surgical History:  Procedure Laterality Date  . APPENDECTOMY     early 42's  . BACK SURGERY  05/2011   x 5  . blood clot in groin  early 80's  . CARPAL TUNNEL RELEASE     left  . CERVICAL SPINE SURGERY  2009  . COLONOSCOPY    . HERNIA REPAIR     early 2000's  . SHOULDER SURGERY  70's   right  . VENTRICULOPERITONEAL SHUNT N/A 10/30/2016   Procedure: Right Ventricular Peritoneal Shunt;  Surgeon: Kary Kos, MD;  Location: [place];  Service: Neurosurgery;  Laterality: N/A;       Home Medications    Prior to Admission medications   Medication Sig Start Date End Date Taking? Authorizing Provider  celecoxib (CELEBREX) 200 MG capsule Take 1 capsule (200 mg total) by mouth daily. 06/29/17   [name], MD  cholecalciferol (VITAMIN D) 1000 units tablet Take 1,000 Units by mouth daily.    [provider]  cyclobenzaprine (FLEXERIL) 10 MG tablet TAKE 1 TABLET BY MOUTH 3 TIMES A DAY AS NEEDED FOR MUSCLE SPASMS 06/06/17  [provider]  diazepam (VALIUM) 2 MG tablet Take 1 tablet (2 mg total) by mouth every 8 (eight) hours as needed for muscle spasms. 06/29/17   [name], MD  docusate sodium (COLACE) 100 MG capsule Take 1 capsule (100 mg total) by mouth 2 (two) times daily. 06/29/17   [name], MD  EASY TOUCH PEN NEEDLES 29G X 12MM MISC  07/21/17   [provider]  ferrous gluconate (FERGON) 324 MG tablet Take 1 tablet (324 mg total) by mouth 2 (two) times daily with a meal. 06/30/17   [name], MD  gabapentin (NEURONTIN) 300 MG capsule Take 300 mg by mouth at bedtime.  06/11/17   [provider]  gabapentin (NEURONTIN) 300 MG capsule Take 1 capsule (300 mg total) by mouth at bedtime.  06/29/17   [name], MD  HYDROcodone-acetaminophen (NORCO/VICODIN) 5-325 MG tablet Take 1 tablet by mouth 3 (three) times daily as needed for severe pain. 08/17/17   [name], MD  ibuprofen (ADVIL,MOTRIN) 600 MG tablet  05/27/17   [provider]  LEVEMIR FLEXTOUCH 100 UNIT/ML Pen  07/21/17   [provider]  metFORMIN (GLUCOPHAGE) 500 MG tablet Take 500 mg by mouth daily with breakfast.     [provider]  naproxen (NAPROSYN) 500 MG tablet Take 1 tablet (500 mg total) by mouth 2 (two) times daily. 08/29/17   Tanna Furry, MD  naproxen (NAPROSYN) 500 MG tablet Take 1 tablet (500 mg total) by mouth 2 (two) times daily. 08/29/17   Tanna Furry, MD  oxyCODONE-acetaminophen (PERCOCET/ROXICET) 5-325 MG tablet Take 1-2 tablets by mouth every 6 (six) hours as needed for severe pain. 07/23/17   [name], MD  paliperidone (INVEGA) 3 MG 24 hr tablet Take 3 mg by mouth at bedtime.  04/22/17   [provider]  paliperidone (INVEGA) 3 MG 24 hr tablet Take 1 tablet (3 mg total) by mouth at bedtime. 06/29/17   [name], MD  QUEtiapine (SEROQUEL) 400 MG tablet Take 800 mg by mouth at bedtime. 05/14/17   [provider]  QUEtiapine (SEROQUEL) 400 MG tablet Take 2 tablets (800 mg total) by mouth at bedtime. 06/29/17   [name], MD  risperiDONE (RISPERDAL) 1 MG tablet Take 1 tablet (1 mg total) by mouth 2 (two) times daily. 07/05/16   Elgergawy, Silver Huguenin, MD  sertraline (ZOLOFT) 100 MG tablet Take 100 mg by mouth at bedtime.    [provider]  sitaGLIPtin (JANUVIA) 100 MG tablet Take 100 mg by mouth daily with breakfast.    [provider]  sulfamethoxazole-trimethoprim (BACTRIM DS,SEPTRA DS) 800-160 MG tablet Take 1 tablet by mouth 2 (two) times daily. 08/29/17 09/08/17  Tanna Furry, MD  tamsulosin (FLOMAX) 0.4 MG CAPS capsule Take 0.4 mg by mouth daily after supper.     [provider]  traZODone (DESYREL) 150 MG tablet Take 150  mg by mouth at bedtime. 04/22/17   [provider]  traZODone (DESYREL) 150 MG tablet Take 1 tablet (150 mg total) by mouth at bedtime. 06/29/17   [name], MD    Family History Family History  Problem Relation Age of Onset  . Heart attack Father   . Anesthesia problems Neg Hx   . Hypotension Neg Hx   . Malignant hyperthermia Neg Hx   . Pseudochol deficiency Neg Hx     Social History Social History  Substance Use Topics  . Smoking status: Current  Every Day Smoker    Packs/day: 1.00    Years: 40.00    Types: Cigarettes  . Smokeless tobacco: Never Used     Comment: pt states that he is ready to quit smoking but thinks he will need patches advised him to speak with his PCP which he is seaing this Thursday 3/26  . Alcohol use No     Allergies   Penicillins   Review of Systems Review of Systems  Constitutional: Negative for appetite change, chills, diaphoresis, fatigue and fever.  HENT: Negative for mouth sores, sore throat and trouble swallowing.   Eyes: Negative for visual disturbance.  Respiratory: Negative for cough, chest tightness, shortness of breath and wheezing.   Cardiovascular: Negative for chest pain.  Gastrointestinal: Negative for abdominal distention, abdominal pain, diarrhea, nausea and vomiting.  Endocrine: Negative for polydipsia, polyphagia and polyuria.  Genitourinary: Positive for scrotal swelling and testicular pain. Negative for dysuria, frequency and hematuria.  Musculoskeletal: Negative for gait problem.  Skin: Negative for color change, pallor and rash.  Neurological: Negative for dizziness, syncope, light-headedness and headaches.  Hematological: Does not bruise/bleed easily.  Psychiatric/Behavioral: Negative for behavioral problems and confusion.     Physical Exam Updated Vital Signs BP (!) 142/83   Pulse 95   Temp 98.7 F (37.1 C) (Oral)   Resp 18   Ht 6' (1.829 m)   Wt 86.2 kg (190 lb)   SpO2 96%   BMI 25.77 kg/m    Physical Exam  Constitutional: He is oriented to person, place, and time. He appears well-developed and well-nourished. No distress.  HENT:  Head: Normocephalic.  Eyes: Pupils are equal, round, and reactive to light. Conjunctivae are normal. No scleral icterus.  Neck: Normal range of motion. Neck supple. No thyromegaly present.  Cardiovascular: Normal rate and regular rhythm.  Exam reveals no gallop and no friction rub.   No murmur heard. Pulmonary/Chest: Effort normal and breath sounds normal. No respiratory distress. He has no wheezes. He has no rales.  Abdominal: Soft. Bowel sounds are normal. He exhibits no distension. There is no tenderness. There is no rebound.  Genitourinary:  Genitourinary Comments: Right epididymis palpably enlarged and tender versus left. Cremasteric reflexes present bilaterally and symmetric.  Musculoskeletal: Normal range of motion.  Neurological: He is alert and oriented to person, place, and time.  Skin: Skin is warm and dry. No rash noted.  Psychiatric: He has a normal mood and affect. His behavior is normal.     ED Treatments / Results  Labs (all labs ordered are listed, but only abnormal results are displayed) Labs Reviewed - No data to display  EKG  EKG Interpretation None       Radiology US Scrotum  Result Date: 08/29/2017 CLINICAL DATA:  Initial evaluation for acute right testicular pain for 3 days. No injury. EXAM: ULTRASOUND OF SCROTUM TECHNIQUE: Complete ultrasound examination of the testicles, epididymis, and other scrotal structures was performed. COMPARISON:  None. FINDINGS: Right testicle Measurements: 2.9 x 3.9 x 2.8 cm. No mass or microlithiasis visualized. Slightly increased vascularity within the right testicle as compared to the left. Left testicle Measurements: 4.6 x 2.5 x 2.9 cm. No mass or microlithiasis visualized. Right epididymis: Right epididymis is somewhat thickened and enlarged with increased vascularity. Findings  suggestive of acute epididymitis. Superimposed complex cyst measuring 7 x 5 x 5 mm noted, of doubtful significance. Left epididymis:  Within normal limits. Hydrocele:  Bilateral hydroceles, right larger than left. Varicocele:  None visualized. IMPRESSION: 1. Thickened and  hypervascular right epididymis, most consistent with acute epididymitis. Mildly increased vascularity within the right testicle as compared to the left suggestive of associated mild/early right-sided orchitis. 2. Bilateral hydroceles, right greater than left, presumably reactive. 3. No other acute abnormality.  No evidence for torsion. Electronically Signed   By: Jeannine Boga M.D.   On: 08/29/2017 16:13   [place] Scrotom W/doppler  Result Date: 08/29/2017 CLINICAL DATA:  Initial evaluation for acute right testicular pain for 3 days. No injury. EXAM: ULTRASOUND OF SCROTUM TECHNIQUE: Complete ultrasound examination of the testicles, epididymis, and other scrotal structures was performed. COMPARISON:  None. FINDINGS: Right testicle Measurements: 2.9 x 3.9 x 2.8 cm. No mass or microlithiasis visualized. Slightly increased vascularity within the right testicle as compared to the left. Left testicle Measurements: 4.6 x 2.5 x 2.9 cm. No mass or microlithiasis visualized. Right epididymis: Right epididymis is somewhat thickened and enlarged with increased vascularity. Findings suggestive of acute epididymitis. Superimposed complex cyst measuring 7 x 5 x 5 mm noted, of doubtful significance. Left epididymis:  Within normal limits. Hydrocele:  Bilateral hydroceles, right larger than left. Varicocele:  None visualized. IMPRESSION: 1. Thickened and hypervascular right epididymis, most consistent with acute epididymitis. Mildly increased vascularity within the right testicle as compared to the left suggestive of associated mild/early right-sided orchitis. 2. Bilateral hydroceles, right greater than left, presumably reactive. 3. No other acute  abnormality.  No evidence for torsion. Electronically Signed   By: Jeannine Boga M.D.   On: 08/29/2017 16:13    Procedures Procedures (including critical care time)  Medications Ordered in ED Medications  azithromycin (ZITHROMAX) powder 1 g (not administered)     Initial Impression / Assessment and Plan / ED Course  I have reviewed the triage vital signs and the nursing notes.  Pertinent labs & imaging results that were available during my care of the patient were reviewed by me and considered in my medical decision making (see chart for details).     Scrotal ultrasound confirms right epididymitis without torsion or mass. Due to multiple medication interactions with his cervical patient given Zithromax 1 g by mouth now. We'll continue on Bactrim DS twice a day 7 days. Naproxen for pain. Primary care follow-up.  Final Clinical Impressions(s) / ED Diagnoses   Final diagnoses:  Testicular pain  Epididymitis    New Prescriptions New Prescriptions   NAPROXEN (NAPROSYN) 500 MG TABLET    Take 1 tablet (500 mg total) by mouth 2 (two) times daily.   NAPROXEN (NAPROSYN) 500 MG TABLET    Take 1 tablet (500 mg total) by mouth 2 (two) times daily.   SULFAMETHOXAZOLE-TRIMETHOPRIM (BACTRIM DS,SEPTRA DS) 800-160 MG TABLET    Take 1 tablet by mouth 2 (two) times daily.     Tanna Furry, MD 08/29/17 [phone]

## 2017-08-29 NOTE — ED Triage Notes (Signed)
Pt from EMS- complaining of testicular pain for 3 days. Pt seen here yesterday for the pain, pt left before he could have ultrasound. Denies trouble urinating per EMS BP 148/82, HR 120, 97% on room air. CBG 201

## 2017-08-29 NOTE — ED Notes (Signed)
Pt in [place]. RN asked [place] tech to please transport pt to [facility] when finished

## 2017-08-30 LAB — URINE CYTOLOGY ANCILLARY ONLY
Chlamydia: NEGATIVE
Neisseria Gonorrhea: NEGATIVE
Trichomonas: NEGATIVE

## 2017-09-14 NOTE — Telephone Encounter (Signed)
Patient called requesting a RX refill on his oxycodone.  Patient stated that he would have to call back to check on the prescription because his phone is not working.

## 2017-09-16 ENCOUNTER — Ambulatory Visit (INDEPENDENT_AMBULATORY_CARE_PROVIDER_SITE_OTHER): Payer: Medicare Other

## 2017-09-16 ENCOUNTER — Encounter (INDEPENDENT_AMBULATORY_CARE_PROVIDER_SITE_OTHER): Payer: Self-pay | Admitting: Surgery

## 2017-09-16 ENCOUNTER — Ambulatory Visit (INDEPENDENT_AMBULATORY_CARE_PROVIDER_SITE_OTHER): Payer: Medicare Other | Admitting: Surgery

## 2017-09-16 VITALS — BP 126/80 | HR 100 | Ht 72.0 in | Wt 190.0 lb

## 2017-09-16 DIAGNOSIS — M4316 Spondylolisthesis, lumbar region: Secondary | ICD-10-CM

## 2017-09-16 MED ORDER — HYDROCODONE-ACETAMINOPHEN 5-325 MG PO TABS: 1.0000 | ORAL_TABLET | Freq: Three times a day (TID) | ORAL | 0 refills | Status: DC | PRN

## 2017-09-16 NOTE — Telephone Encounter (Signed)
Patient seen in office by Jeneen Rinks

## 2017-09-16 NOTE — Progress Notes (Signed)
Office Visit Note   Patient: [name]           Date of Birth: [birth year]           MRN: [id number] Visit Date: 09/16/2017              Requested by: Nolene Ebbs, MD [address] [place], [place] [address] PCP: Nolene Ebbs, MD   Assessment & Plan: Visit Diagnoses:  1. Spondylolisthesis, lumbar region     Plan:  Patient will continue wearing his brace. He'll call the office in a few days and we will see if Dr. Louanne Skye thinks he can begin weaning out of this. Refilled Norco. We'll need to continue weaning off pain medication. Follow-Up Instructions: Return in about 6 weeks (around 10/28/2017).   Orders:  Orders Placed This Encounter  Procedures  . XR Lumbar Spine 2-3 Views   Meds ordered this encounter  Medications  . HYDROcodone-acetaminophen (NORCO/VICODIN) 5-325 MG tablet    Sig: Take 1 tablet by mouth 3 (three) times daily as needed for severe pain.    Dispense:  30 tablet    Refill:  0      Procedures: No procedures performed   Clinical Data: No additional findings.   Subjective: Chief Complaint  Patient presents with  . Lower Back - Routine Post Op    HPI Patient returns. Status post L5-S1 Tlif 06/25/2017. Patient did not come in for his regular scheduled postop appointment.  States that he is doing well. Has been wearing his brace. Preoperative leg symptoms are improved. Does continue to have some leg weakness. Review of Systems No current cardiopulmonary GI GU issues.  Objective: Vital Signs: BP 126/80   Pulse 100   Ht 6' (1.829 m)   Wt 190 lb (86.2 kg)   BMI 25.77 kg/m   Physical Exam  Constitutional: He is oriented to person, place, and time. He appears well-developed. No distress.  HENT:  Head: Normocephalic and atraumatic.  Eyes: EOM are normal. Pupils are equal, round, and reactive to light.  Pulmonary/Chest: No respiratory distress.  Musculoskeletal:  Surgical incision is well-healed. Neurovascular intact. Bilateral calves  are nontender.  Neurological: He is oriented to person, place, and time.    Ortho Exam  Specialty Comments:  No specialty comments available.  Imaging: No results found.   PMFS History: Patient Active Problem List   Diagnosis Date Noted  . Anemia due to blood loss 06/30/2017    Class: Acute  . Diabetes mellitus without complication ([place]) 28/31/5176    Class: Chronic  . Spinal stenosis of lumbar region 06/29/2017    Class: Chronic  . Cauda equina compression ([place]) 06/29/2017    Class: Acute  . Spondylolisthesis, lumbar region 06/29/2017    Class: Chronic  . History of lumbar spinal fusion 06/26/2017  . Myelopathy ([place]) 06/24/2017  . Low back pain 06/24/2017  . NPH (normal pressure hydrocephalus) 10/30/2016  . Normal pressure hydrocephalus 09/01/2016  . Bilateral sciatica 09/01/2016  . Altered mental status 07/01/2016  . Acute encephalopathy 07/01/2016  . Hydrocephalus in adult 07/01/2016  . Shaking spells 06/01/2016  . Gait disturbance 04/10/2015  . Numbness 04/10/2015  . Lumbar radicular pain 04/10/2015  . Lumbar spondylosis 04/10/2015  . Bipolar I disorder with depression, severe ([place]) 06/30/2014  . Suicidal ideations 06/30/2014  . Ulcer of lower limb, unspecified 02/20/2014  . CELLULITIS AND ABSCESS OF LEG EXCEPT FOOT 11/08/2007   Past Medical History:  Diagnosis Date  . Anxiety   . Bipolar  disorder ([place])   . Chronic back pain   . Depression   . Diabetes mellitus without complication ([place])   . Diabetic neuropathy ([place])   . DVT (deep venous thrombosis) ([place])   . Enlarged prostate   . Headache   . Hepatitis    Hepatitis C - has been treated with Harvoni (cleared in July, 2017)  . History of colon polyps   . Mental disorder    bipolar  . Peripheral vascular disease ([place])   . Pneumonia    hx of-about 33yrs ago  . Schizophrenia ([place])     Family History  Problem Relation Age of Onset  . Heart attack Father   . Anesthesia problems Neg Hx   . Hypotension  Neg Hx   . Malignant hyperthermia Neg Hx   . Pseudochol deficiency Neg Hx     Past Surgical History:  Procedure Laterality Date  . APPENDECTOMY     early 60's  . BACK SURGERY  05/2011   x 5  . blood clot in groin  early 80's  . CARPAL TUNNEL RELEASE     left  . CERVICAL SPINE SURGERY  2009  . COLONOSCOPY    . HERNIA REPAIR     early 2000's  . SHOULDER SURGERY  70's   right  . VENTRICULOPERITONEAL SHUNT N/A 10/30/2016   Procedure: Right Ventricular Peritoneal Shunt;  Surgeon: Kary Kos, MD;  Location: [facility];  Service: Neurosurgery;  Laterality: N/A;   Social History   Occupational History  . Not on file.   Social History Main Topics  . Smoking status: Current Every Day Smoker    Packs/day: 1.00    Years: 40.00    Types: Cigarettes  . Smokeless tobacco: Never Used     Comment: pt states that he is ready to quit smoking but thinks he will need patches advised him to speak with his PCP which he is seaing this Thursday 3/26  . Alcohol use No  . Drug use: No  . Sexual activity: Yes

## 2017-09-17 ENCOUNTER — Ambulatory Visit (INDEPENDENT_AMBULATORY_CARE_PROVIDER_SITE_OTHER): Payer: Self-pay | Admitting: Specialist

## 2017-10-07 ENCOUNTER — Telehealth (INDEPENDENT_AMBULATORY_CARE_PROVIDER_SITE_OTHER): Payer: Self-pay | Admitting: Specialist

## 2017-10-07 NOTE — Telephone Encounter (Signed)
Patient called requesting a RX refill on his Vicodin.  He is completely out and stated that he is in a lot of pain.  Also he wanted to know if he still needs to wear the back brace.  Patient has a new number, which he can not remember, so he will call back to check on his prescription.

## 2017-10-07 NOTE — Telephone Encounter (Signed)
Cipriano Bunker RN with [facility] states Patient still in a lot of pain & is very uncomfortable when he doe not wear the back brace. She was just concern that he still havs pain with out brace.

## 2017-10-08 NOTE — Telephone Encounter (Signed)
Can you ask Dr. Lorin Mercy or Jeneen Rinks about this one this afternoon?

## 2017-10-08 NOTE — Telephone Encounter (Signed)
I attempted to reach patient, did not get answer. Per message, he is unsure of his new number.

## 2017-10-08 NOTE — Telephone Encounter (Signed)
Can you please advise?  You last prescribed Hydrocodone 5/325 1 po tid prn #30 on 09/16/17. Also please advise on back brace.

## 2017-10-08 NOTE — Telephone Encounter (Signed)
Give Ultram 50 mg 1 tab by mouth every 12 hours when necessary #60 tablets no refills

## 2017-10-10 ENCOUNTER — Encounter (HOSPITAL_COMMUNITY): Payer: Self-pay | Admitting: Emergency Medicine

## 2017-10-10 ENCOUNTER — Emergency Department (HOSPITAL_COMMUNITY)
Admission: EM | Admit: 2017-10-10 | Discharge: 2017-10-10 | Disposition: A | Discharge status: Home / Self Care | Payer: Medicare Other | Attending: Emergency Medicine | Admitting: Emergency Medicine

## 2017-10-10 DIAGNOSIS — E119 Type 2 diabetes mellitus without complications: Secondary | ICD-10-CM | POA: Diagnosis not present

## 2017-10-10 DIAGNOSIS — F419 Anxiety disorder, unspecified: Secondary | ICD-10-CM | POA: Insufficient documentation

## 2017-10-10 DIAGNOSIS — M5441 Lumbago with sciatica, right side: Secondary | ICD-10-CM | POA: Diagnosis not present

## 2017-10-10 DIAGNOSIS — F319 Bipolar disorder, unspecified: Secondary | ICD-10-CM | POA: Diagnosis not present

## 2017-10-10 DIAGNOSIS — Z794 Long term (current) use of insulin: Secondary | ICD-10-CM | POA: Diagnosis not present

## 2017-10-10 DIAGNOSIS — F209 Schizophrenia, unspecified: Secondary | ICD-10-CM | POA: Insufficient documentation

## 2017-10-10 DIAGNOSIS — Z79899 Other long term (current) drug therapy: Secondary | ICD-10-CM | POA: Diagnosis not present

## 2017-10-10 DIAGNOSIS — Z791 Long term (current) use of non-steroidal anti-inflammatories (NSAID): Secondary | ICD-10-CM | POA: Insufficient documentation

## 2017-10-10 DIAGNOSIS — F17210 Nicotine dependence, cigarettes, uncomplicated: Secondary | ICD-10-CM | POA: Insufficient documentation

## 2017-10-10 DIAGNOSIS — M545 Low back pain: Secondary | ICD-10-CM | POA: Diagnosis present

## 2017-10-10 MED ORDER — KETOROLAC TROMETHAMINE 30 MG/ML IJ SOLN
30.0000 mg | Freq: Once | INTRAMUSCULAR | Status: AC
  Administered 2017-10-10: 30 mg via INTRAVENOUS
  Filled 2017-10-10: qty 1

## 2017-10-10 MED ORDER — DIAZEPAM 5 MG PO TABS
5.0000 mg | ORAL_TABLET | Freq: Once | ORAL | Status: AC
  Administered 2017-10-10: 5 mg via ORAL
  Filled 2017-10-10: qty 1

## 2017-10-10 NOTE — ED Provider Notes (Signed)
[facility] EMERGENCY DEPARTMENT Provider Note   CSN: [id number] Arrival date & time: 10/10/17  1642     History   Chief Complaint Chief Complaint  Patient presents with  . Back Pain    HPI [name] is a 61 y.o. male.  HPI   Patient is a 61 year old male with a recent history of lumbar spine fusion in July 2018 presenting for worsening low back pain.  Patient reports that he has been treated with hydrocodone, however he is not his prescription, and has had worsening back pain.  Patient is also reporting that he has had increasing right lower extremity decreased sensation.  Patient does not feel that he has weakness in his lower extremities or foot drop.  Patient reports he is still been ambulatory.  Patient has had a several month problem with urinary incontinence, however he has not had any new changes in the or loss of bowel control.  Patient denies saddle anesthesia.  Patient denies IV drug use, fever, chills, history of cancer.    Patient is followed by [facility] for his lumbar fusion.  Per record review, patient has a prescription filled for tramadol however they were unable to reach the patient over the phone regarding this prescription.  Past Medical History:  Diagnosis Date  . Anxiety   . Bipolar disorder ([place])   . Chronic back pain   . Depression   . Diabetes mellitus without complication ([place])   . Diabetic neuropathy ([place])   . DVT (deep venous thrombosis) ([place])   . Enlarged prostate   . Headache   . Hepatitis    Hepatitis C - has been treated with Harvoni (cleared in July, 2017)  . History of colon polyps   . Mental disorder    bipolar  . Peripheral vascular disease ([place])   . Pneumonia    hx of-about 24yrs ago  . Schizophrenia [facility])     Patient Active Problem List   Diagnosis Date Noted  . Anemia due to blood loss 06/30/2017    Class: Acute  . Diabetes mellitus without complication ([place]) 23/30/0762    Class: Chronic  .  Spinal stenosis of lumbar region 06/29/2017    Class: Chronic  . Cauda equina compression ([place]) 06/29/2017    Class: Acute  . Spondylolisthesis, lumbar region 06/29/2017    Class: Chronic  . History of lumbar spinal fusion 06/26/2017  . Myelopathy ([place]) 06/24/2017  . Low back pain 06/24/2017  . NPH (normal pressure hydrocephalus) 10/30/2016  . Normal pressure hydrocephalus 09/01/2016  . Bilateral sciatica 09/01/2016  . Altered mental status 07/01/2016  . Acute encephalopathy 07/01/2016  . Hydrocephalus in adult 07/01/2016  . Shaking spells 06/01/2016  . Gait disturbance 04/10/2015  . Numbness 04/10/2015  . Lumbar radicular pain 04/10/2015  . Lumbar spondylosis 04/10/2015  . Bipolar I disorder with depression, severe ([place]) 06/30/2014  . Suicidal ideations 06/30/2014  . Ulcer of lower limb, unspecified 02/20/2014  . CELLULITIS AND ABSCESS OF LEG EXCEPT FOOT 11/08/2007    Past Surgical History:  Procedure Laterality Date  . APPENDECTOMY     early 75's  . BACK SURGERY  05/2011   x 5  . blood clot in groin  early 80's  . CARPAL TUNNEL RELEASE     left  . CERVICAL SPINE SURGERY  2009  . COLONOSCOPY    . HERNIA REPAIR     early 2000's  . SHOULDER SURGERY  70's   right  Home Medications    Prior to Admission medications   Medication Sig Start Date End Date Taking? Authorizing Provider  celecoxib (CELEBREX) 200 MG capsule Take 1 capsule (200 mg total) by mouth daily. Patient not taking: Reported on 09/16/2017 06/29/17   [name], MD  cholecalciferol (VITAMIN D) 1000 units tablet Take 1,000 Units by mouth daily.    [provider]  cyclobenzaprine (FLEXERIL) 10 MG tablet TAKE 1 TABLET BY MOUTH 3 TIMES A DAY AS NEEDED FOR MUSCLE SPASMS 06/06/17   [provider]  diazepam (VALIUM) 2 MG tablet Take 1 tablet (2 mg total) by mouth every 8 (eight) hours as needed for muscle spasms. Patient not taking: Reported on 09/16/2017 06/29/17   [name], MD   docusate sodium (COLACE) 100 MG capsule Take 1 capsule (100 mg total) by mouth 2 (two) times daily. Patient not taking: Reported on 09/16/2017 06/29/17   [name], MD  EASY TOUCH PEN NEEDLES 29G X 12MM [place]  07/21/17   [provider]  ferrous gluconate (FERGON) 324 MG tablet Take 1 tablet (324 mg total) by mouth 2 (two) times daily with a meal. 06/30/17   [name], MD  gabapentin (NEURONTIN) 300 MG capsule Take 300 mg by mouth at bedtime.  06/11/17   [provider]  gabapentin (NEURONTIN) 300 MG capsule Take 1 capsule (300 mg total) by mouth at bedtime. Patient not taking: Reported on 09/16/2017 06/29/17   [name], MD  HYDROcodone-acetaminophen (NORCO/VICODIN) 5-325 MG tablet Take 1 tablet by mouth 3 (three) times daily as needed for severe pain. 09/16/17   Lanae Crumbly, PA-C  ibuprofen (ADVIL,MOTRIN) 600 MG tablet  05/27/17   [provider]  LEVEMIR FLEXTOUCH 100 UNIT/ML Pen  07/21/17   [provider]  metFORMIN (GLUCOPHAGE) 500 MG tablet Take 500 mg by mouth daily with breakfast.     [provider]  naproxen (NAPROSYN) 500 MG tablet Take 1 tablet (500 mg total) by mouth 2 (two) times daily. Patient not taking: Reported on 09/16/2017 08/29/17   Tanna Furry, MD  naproxen (NAPROSYN) 500 MG tablet Take 1 tablet (500 mg total) by mouth 2 (two) times daily. Patient not taking: Reported on 09/16/2017 08/29/17   Tanna Furry, MD  oxyCODONE-acetaminophen (PERCOCET/ROXICET) 5-325 MG tablet Take 1-2 tablets by mouth every 6 (six) hours as needed for severe pain. Patient not taking: Reported on 09/16/2017 07/23/17   [name], MD  paliperidone (INVEGA) 3 MG 24 hr tablet Take 3 mg by mouth at bedtime.  04/22/17   [provider]  paliperidone (INVEGA) 3 MG 24 hr tablet Take 1 tablet (3 mg total) by mouth at bedtime. 06/29/17   [name], MD  QUEtiapine (SEROQUEL) 400 MG tablet Take 800 mg by mouth at bedtime. 05/14/17   [provider]  QUEtiapine (SEROQUEL) 400 MG tablet Take 2 tablets (800 mg total) by mouth at bedtime. Patient not taking: Reported on 09/16/2017 06/29/17   [name], MD  risperiDONE (RISPERDAL) 1 MG tablet Take 1 tablet (1 mg total) by mouth 2 (two) times daily. Patient not taking: Reported on 09/16/2017 07/05/16   Elgergawy, Silver Huguenin, MD  sertraline (ZOLOFT) 100 MG tablet Take 100 mg by mouth at bedtime.    [provider]  sitaGLIPtin (JANUVIA) 100 MG tablet Take 100 mg by mouth daily with breakfast.    [provider]  tamsulosin (FLOMAX) 0.4 MG CAPS capsule Take 0.4 mg by  mouth daily after supper.     [provider]  traZODone (DESYREL) 150 MG tablet Take 150 mg by mouth at bedtime. 04/22/17   [provider]  traZODone (DESYREL) 150 MG tablet Take 1 tablet (150 mg total) by mouth at bedtime. Patient not taking: Reported on 09/16/2017 06/29/17   [name], MD    Family History Family History  Problem Relation Age of Onset  . Heart attack Father   . Anesthesia problems Neg Hx   . Hypotension Neg Hx   . Malignant hyperthermia Neg Hx   . Pseudochol deficiency Neg Hx     Social History Social History   Tobacco Use  . Smoking status: Current Every Day Smoker    Packs/day: 1.00    Years: 40.00    Pack years: 40.00    Types: Cigarettes  . Smokeless tobacco: Never Used  . Tobacco comment: pt states that he is ready to quit smoking but thinks he will need patches advised him to speak with his PCP which he is seaing this Thursday 3/26  Substance Use Topics  . Alcohol use: No  . Drug use: No     Allergies   Penicillins   Review of Systems Review of Systems  Constitutional: Negative for chills and fever.  Gastrointestinal: Negative for abdominal pain.  Genitourinary: Negative for difficulty urinating.  Musculoskeletal: Positive for arthralgias and back pain. Negative for gait problem and neck pain.  Neurological: Positive for  numbness. Negative for weakness.     Physical Exam Updated Vital Signs BP 107/61   Pulse 91   Temp 98.6 F (37 C)   Resp 18   Ht 6' (1.829 m)   Wt 85.7 kg (189 lb)   SpO2 100%   BMI 25.63 kg/m   Physical Exam  Constitutional: He appears well-developed and well-nourished. No distress.  Sitting comfortably in bed.  HENT:  Head: Normocephalic and atraumatic.  Eyes: Conjunctivae are normal. Right eye exhibits no discharge. Left eye exhibits no discharge.  EOMs normal to gross examination.  Neck: Normal range of motion.  Cardiovascular: Normal rate and regular rhythm.  Intact, 2+ DP and PT pulses bilateral lower extremities.  Pulmonary/Chest:  Normal respiratory effort. Patient converses comfortably. No audible wheeze or stridor.  Abdominal: He exhibits no distension.  Musculoskeletal: Normal range of motion.  Spine Exam: Inspection/Palpation: No tenderness to palpation of cervical or thoracic midline or paraspinal musculature.  There is a well-healed lumbar fusion scar at the midline.  Tenderness to palpation surrounding the scar and particularly in the right paraspinal musculature of the lumbar region. Strength: 5/5 throughout LLE bilaterally (hip flexion/extension, adduction/abduction; knee flexion/extension; foot dorsiflexion/plantarflexion, inversion/eversion; great toe inversion). Strength 4+/5 throughout RLE b/l.  (hip flexion/extension, adduction/abduction; knee flexion/extension; foot dorsiflexion/plantarflexion, inversion/eversion; great toe inversion). Sensation: Intact to light touch in proximal and distal LLE bilaterally.  Sensation is intact in the right lower extremity, however it is decreased.  Patient was unable to feel in the first dorsal webspace of the right lower extremity. Reflexes: 2+ quadriceps and achilles reflexes. No hyperreflexia.  Patient has downgoing plantar reflexes bilaterally.  No clonus. Rectal: Normal rectal tone Patient exhibits antalgic gait,  however shows no evidence of foot drop and is ambulating without difficulty in the department.  Neurological: He is alert.  Cranial nerves intact to gross observation.   Skin: Skin is warm and dry. He is not diaphoretic.  Psychiatric: He has a normal mood and affect. His behavior is normal. Judgment and thought  content normal.  Nursing note and vitals reviewed.    ED Treatments / Results  Labs (all labs ordered are listed, but only abnormal results are displayed) Labs Reviewed - No data to display  EKG  EKG Interpretation None       Radiology No results found.  Procedures Procedures (including critical care time)  Medications Ordered in ED Medications  ketorolac (TORADOL) 30 MG/ML injection 30 mg (30 mg Intravenous Given 10/10/17 1842)  diazepam (VALIUM) tablet 5 mg (5 mg Oral Given 10/10/17 1836)     Initial Impression / Assessment and Plan / ED Course  I have reviewed the triage vital signs and the nursing notes.  Pertinent labs & imaging results that were available during my care of the patient were reviewed by me and considered in my medical decision making (see chart for details).     Final Clinical Impressions(s) / ED Diagnoses   Final diagnoses:  Acute bilateral low back pain with right-sided sciatica   Patient denies any concerning symptoms suggestive of cauda equina requiring urgent imaging at this time such as lower extremity weakness, loss of bowel or bladder control, saddle anesthesia, urinary retention, fever/chills, IVDU.  Per chart review, patient has had some urinary incontinence for several months and this is being worked up neurologically.  Patient has some decreased sensation in the right lower extremity, however he has normal rectal tone and is otherwise completely neurologically intact in the lower extremities.  Patient's slight weakness on the right appears to be due to pain is patient reports that when he engages the muscles he gets the pain in  his back on the right side.  I do not suspect acute cauda equina compression as the cause of patient's acute back pain.  After treatment with Toradol and Valium, patient was ambulating without difficulty in the emergency department and requesting discharge.  Regarding patient's pain prescription, per chart review it appears that providers at [facility] are attempting to reach patient to fill a tramadol prescription.  I discussed with the patient.  I encouraged him to call [facility] first thing tomorrow morning to clarify this prescription.  Patient given strict return precautions for any symptoms indicating worsening neurologic function in the lower extremities.   ED Discharge Orders    None       Tamala Julian 10/10/17 1930    Margette Fast, MD 10/10/17 2121

## 2017-10-10 NOTE — ED Notes (Signed)
EDP verbally confirms that Toradol is to be administered IM

## 2017-10-10 NOTE — Discharge Instructions (Signed)
Please see the information and instructions below regarding your visit.  Your diagnoses today include:  1. Acute bilateral low back pain with right-sided sciatica    About diagnosis. Most episodes of acute low back pain are self-limited. Your exam was reassuring today that the source of your pain is not affecting the spinal cord and nerves that originate in the spinal cord.   If you have a history of disc herniation or arthritis in your spine, the nerves exiting the spine on one side get inflamed. This can cause severe pain. We call this radiculopathy. We do not always know what causes the sudden inflammation.  Tests performed today include: See side panel of your discharge paperwork for testing performed today. Vital signs are listed at the bottom of these instructions.   Medications prescribed:    Take any prescribed medications only as prescribed, and any over the counter medications only as directed on the packaging.  According to records written by [facility], please call them because the records indicate that there is a tramadol prescription.  Home care instructions:   Low back pain gets worse the longer you stay stationary. Please keep moving and walking as tolerated. There are exercises included in this packet to perform as tolerated for your low back pain.   Apply heat to the areas that are painful. Avoid twisting or bending your trunk to lift something. Do not lift anything above 25 lbs while recovering from this flare of low back pain.  Please follow any educational materials contained in this packet.   Follow-up instructions: Please follow-up with your primary care provider as soon as possible for further evaluation of your symptoms if they are not completely improved.   Please follow up with [facility] as soon as possible.  Return instructions:  Please return to the Emergency Department if you experience worsening symptoms.  Please return for any  fever or chills in the setting of your back pain, weakness in the muscles of the legs, new numbness in your legs and feet that is new or changing, numbness in the area where you wipe, retention of your urine, loss of bowel or bladder control, or problems with walking. Please return if you have any other emergent concerns.  Additional Information:   Your vital signs today were: BP 107/61    Pulse 91    Temp 98.6 F (37 C)    Resp 18    Ht 6' (1.829 m)    Wt 85.7 kg (189 lb)    SpO2 100%    BMI 25.63 kg/m  If your blood pressure (BP) was elevated on multiple readings during this visit above 130 for the top number or above 80 for the bottom number, please have this repeated by your primary care provider within one month. --------------  Thank you for allowing [place] to participate in your care today.

## 2017-10-10 NOTE — ED Notes (Signed)
Pt stable, ambulatory, states understanding of discharge instructions 

## 2017-10-10 NOTE — ED Triage Notes (Signed)
Pt states back surgery 1 month ago. Pt states he was taking vicodin for pain with relief. Ran out 1 week ago. Pain came back 5 days ago.

## 2017-10-12 ENCOUNTER — Encounter (INDEPENDENT_AMBULATORY_CARE_PROVIDER_SITE_OTHER): Payer: Self-pay | Admitting: Surgery

## 2017-10-28 ENCOUNTER — Ambulatory Visit (INDEPENDENT_AMBULATORY_CARE_PROVIDER_SITE_OTHER): Payer: Medicare Other | Admitting: Surgery

## 2017-11-04 ENCOUNTER — Ambulatory Visit (INDEPENDENT_AMBULATORY_CARE_PROVIDER_SITE_OTHER): Payer: Medicare Other

## 2017-11-04 ENCOUNTER — Other Ambulatory Visit (INDEPENDENT_AMBULATORY_CARE_PROVIDER_SITE_OTHER): Payer: Self-pay | Admitting: Specialist

## 2017-11-04 ENCOUNTER — Ambulatory Visit (INDEPENDENT_AMBULATORY_CARE_PROVIDER_SITE_OTHER): Payer: Medicare Other | Admitting: Surgery

## 2017-11-04 ENCOUNTER — Other Ambulatory Visit (INDEPENDENT_AMBULATORY_CARE_PROVIDER_SITE_OTHER): Payer: Self-pay

## 2017-11-04 ENCOUNTER — Encounter (INDEPENDENT_AMBULATORY_CARE_PROVIDER_SITE_OTHER): Payer: Self-pay | Admitting: Surgery

## 2017-11-04 DIAGNOSIS — M533 Sacrococcygeal disorders, not elsewhere classified: Secondary | ICD-10-CM

## 2017-11-04 DIAGNOSIS — G8929 Other chronic pain: Secondary | ICD-10-CM

## 2017-11-04 DIAGNOSIS — M545 Low back pain: Secondary | ICD-10-CM | POA: Diagnosis not present

## 2017-11-04 NOTE — Telephone Encounter (Signed)
Sent to [facility]  

## 2017-11-04 NOTE — Telephone Encounter (Signed)
Patient called asking for a refill on Vicodin, he states he fell the other day and its caused the pain to worsen. CB #  [phone] If he could pick that up tomorrow, if at all possible.

## 2017-11-04 NOTE — Addendum Note (Signed)
Addended by: Minda Ditto, Geoffery Spruce on: 11/04/2017 05:41 PM   Modules accepted: Orders

## 2017-11-04 NOTE — Progress Notes (Signed)
Office Visit Note   Patient: [name]           Date of Birth: 08-19-1956           MRN: [id number] Visit Date: 11/04/2017              Requested by: Nolene Ebbs, MD [address] [place], [place] [address] PCP: Nolene Ebbs, MD   Assessment & Plan: Visit Diagnoses:  1. Chronic bilateral low back pain, with sciatica presence unspecified   2. Chronic SI joint pain     Plan: With patient's pain at the bilateral SI joints I will schedule injections with Dr. Ernestina Patches. Advised patient to pay close attention to how he feels it medially after the injections. Follow up with [place] in a few weeks to check his response. Patient asked for narcotic medication and I advised him that I absolutely cannot give him anything at this time especially with his chronic balance issues.  Follow-Up Instructions: Return in about 3 weeks (around 11/25/2017) for recheck Dr Louanne Skye after injection. .   Orders:  Orders Placed This Encounter  Procedures  . XR Lumbar Spine 2-3 Views  . Ambulatory referral to Physical Medicine Rehab   No orders of the defined types were placed in this encounter.     Procedures: No procedures performed   Clinical Data: No additional findings.   Subjective: No chief complaint on file.   HPI 60-year-old white male returns. Status post L5-S1 tlif 06/25/2017. Is complaining of low back pain around the bilateral SI joints. Pain when he is ambulating and sitting. Stop wearing his brace about a month ago. States that he fell about a month ago due to ongoing balance issues. Patient has a right ventricular peritoneal shunt that he has not followed up with his neurosurgeon yet to discuss these ongoing balance issues. Review of Systems No current cardiac pulmonary GI GU issues  Objective: Vital Signs: There were no vitals taken for this visit.  Physical Exam  Constitutional: No distress.  HENT:  Head: Normocephalic and atraumatic.  Eyes: EOM are normal. Pupils are  equal, round, and reactive to light.  Musculoskeletal:  Patient has marked tenderness over the bilateral SI joints. Surgical incision is well-healed. Neurovascular intact. Negative logroll bilateral hips. Negative straight leg raise.  Neurological: He is alert.  Skin: Skin is warm and dry.    Ortho Exam  Specialty Comments:  No specialty comments available.  Imaging: No results found.   PMFS History: Patient Active Problem List   Diagnosis Date Noted  . Anemia due to blood loss 06/30/2017    Class: Acute  . Diabetes mellitus without complication ([place]) 08/65/7846    Class: Chronic  . Spinal stenosis of lumbar region 06/29/2017    Class: Chronic  . Cauda equina compression ([place]) 06/29/2017    Class: Acute  . Spondylolisthesis, lumbar region 06/29/2017    Class: Chronic  . History of lumbar spinal fusion 06/26/2017  . Myelopathy ([place]) 06/24/2017  . Low back pain 06/24/2017  . NPH (normal pressure hydrocephalus) 10/30/2016  . Normal pressure hydrocephalus 09/01/2016  . Bilateral sciatica 09/01/2016  . Altered mental status 07/01/2016  . Acute encephalopathy 07/01/2016  . Hydrocephalus in adult 07/01/2016  . Shaking spells 06/01/2016  . Gait disturbance 04/10/2015  . Numbness 04/10/2015  . Lumbar radicular pain 04/10/2015  . Lumbar spondylosis 04/10/2015  . Bipolar I disorder with depression, severe ([place]) 06/30/2014  . Suicidal ideations 06/30/2014  . Ulcer of lower limb, unspecified 02/20/2014  .  CELLULITIS AND ABSCESS OF LEG EXCEPT FOOT 11/08/2007   Past Medical History:  Diagnosis Date  . Anxiety   . Bipolar disorder ([place])   . Chronic back pain   . Depression   . Diabetes mellitus without complication ([place])   . Diabetic neuropathy ([place])   . DVT (deep venous thrombosis) ([place])   . Enlarged prostate   . Headache   . Hepatitis    Hepatitis C - has been treated with Harvoni (cleared in July, 2017)  . History of colon polyps   . Mental disorder    bipolar  .  Peripheral vascular disease ([place])   . Pneumonia    hx of-about 35yrs ago  . Schizophrenia ([place])     Family History  Problem Relation Age of Onset  . Heart attack Father   . Anesthesia problems Neg Hx   . Hypotension Neg Hx   . Malignant hyperthermia Neg Hx   . Pseudochol deficiency Neg Hx     Past Surgical History:  Procedure Laterality Date  . APPENDECTOMY     early 20's  . BACK SURGERY  05/2011   x 5  . blood clot in groin  early 80's  . CARPAL TUNNEL RELEASE     left  . CERVICAL SPINE SURGERY  2009  . COLONOSCOPY    . HERNIA REPAIR     early 2000's  . SHOULDER SURGERY  70's   right  . VENTRICULOPERITONEAL SHUNT N/A 10/30/2016   Procedure: Right Ventricular Peritoneal Shunt;  Surgeon: Kary Kos, MD;  Location: [facility];  Service: Neurosurgery;  Laterality: N/A;   Social History   Occupational History  . Not on file  Tobacco Use  . Smoking status: Current Every Day Smoker    Packs/day: 1.00    Years: 40.00    Pack years: 40.00    Types: Cigarettes  . Smokeless tobacco: Never Used  . Tobacco comment: pt states that he is ready to quit smoking but thinks he will need patches advised him to speak with his PCP which he is seaing this Thursday 3/26  Substance and Sexual Activity  . Alcohol use: No  . Drug use: No  . Sexual activity: Yes

## 2017-11-05 ENCOUNTER — Telehealth (INDEPENDENT_AMBULATORY_CARE_PROVIDER_SITE_OTHER): Payer: Self-pay | Admitting: Specialist

## 2017-11-05 NOTE — Telephone Encounter (Signed)
Tried calling patient to advise would be next week before know about refill since Dr Louanne Skye not in office this afternoon.

## 2017-11-05 NOTE — Telephone Encounter (Signed)
Patient called asking for a refill on his Vicodin. CB #  [phone]

## 2017-11-10 ENCOUNTER — Other Ambulatory Visit: Payer: Self-pay | Admitting: Neurosurgery

## 2017-11-10 ENCOUNTER — Telehealth (INDEPENDENT_AMBULATORY_CARE_PROVIDER_SITE_OTHER): Payer: Self-pay | Admitting: Specialist

## 2017-11-10 DIAGNOSIS — G912 (Idiopathic) normal pressure hydrocephalus: Secondary | ICD-10-CM

## 2017-11-10 NOTE — Telephone Encounter (Signed)
Patient called wanting to know when his Vicodin RX would be filled, in previous message it says it would be filled this week. CB #  [phone]

## 2017-11-10 NOTE — Telephone Encounter (Signed)
I called and advsied pt that rx was denied, he was worked in on 11/11/2017 to see james @ 1015

## 2017-11-11 ENCOUNTER — Ambulatory Visit (INDEPENDENT_AMBULATORY_CARE_PROVIDER_SITE_OTHER): Payer: Medicare Other | Admitting: Surgery

## 2017-11-15 ENCOUNTER — Other Ambulatory Visit: Payer: Medicare Other

## 2017-11-17 ENCOUNTER — Ambulatory Visit (INDEPENDENT_AMBULATORY_CARE_PROVIDER_SITE_OTHER): Payer: Medicare Other | Admitting: Physical Medicine and Rehabilitation

## 2017-11-17 ENCOUNTER — Encounter (INDEPENDENT_AMBULATORY_CARE_PROVIDER_SITE_OTHER): Payer: Self-pay | Admitting: Physical Medicine and Rehabilitation

## 2017-11-17 ENCOUNTER — Ambulatory Visit (INDEPENDENT_AMBULATORY_CARE_PROVIDER_SITE_OTHER): Payer: Self-pay

## 2017-11-17 DIAGNOSIS — M461 Sacroiliitis, not elsewhere classified: Secondary | ICD-10-CM | POA: Diagnosis not present

## 2017-11-17 NOTE — Progress Notes (Deleted)
Patient is referred for bilateral SI joint injections. He has chronic low back pain that is equal on both sides today. He states that he hurts all over. He does describe some numbness and tingling in his right leg. He denies taking blood thinners.

## 2017-11-17 NOTE — Patient Instructions (Signed)

## 2017-11-17 NOTE — Progress Notes (Signed)
[name] - 61 y.o. male MRN [id number]  Date of birth: Sep 21, 1956  Office Visit Note: Visit Date: 11/17/2017 PCP: Nolene Ebbs, MD Referred by: Nolene Ebbs, MD  Subjective: Chief Complaint  Patient presents with  . Lower Back - Pain   HPI: Mr. Escoto is a 61 year old complicated patient with history of bipolar disorder, schizophrenia, diabetic neuropathy and instrumented lumbar fusion.  He recently saw Benjiman Core for his back pain and they suggested diagnostic and hopefully therapeutic bilateral sacroiliac joint injections.  We will perform this today.  The patient also has a history of polysubstance abuse in the past.  Patient does not likely represent a good candidate for any opioid management.    ROS Otherwise per HPI.  Assessment & Plan: Visit Diagnoses:  1. Sacroiliitis ([place])     Plan: No additional findings.   Meds & Orders: No orders of the defined types were placed in this encounter.   Orders Placed This Encounter  Procedures  . Sacroiliac Joint Inj  . XR C-ARM NO REPORT    Follow-up: Return in about 4 weeks (around 12/15/2017) for Dr. Louanne Skye.   Procedures: Sacroiliac Joint Inj on 11/17/2017 2:51 PM Indications: pain and diagnostic evaluation Details: 22 G 3.5 in needle, fluoroscopy-guided posterior approach Medications (Right): 2 mL bupivacaine 0.5 %; 40 mg triamcinolone acetonide 40 MG/ML Medications (Left): 2 mL bupivacaine 0.5 %; 40 mg triamcinolone acetonide 40 MG/ML Outcome: tolerated well, no immediate complications  There was excellent flow of contrast producing a partial arthrograms of the sacroiliac joints.  Procedure, treatment alternatives, risks and benefits explained, specific risks discussed. Consent was given by the patient. Immediately prior to procedure a time out was called to verify the correct patient, procedure, equipment, support staff and site/side marked as required. Patient was prepped and draped in the usual sterile fashion.       No notes on file   Clinical History: Lumbar spine MRI IMPRESSION: 1. L2-L5 posterior instrumented fusion. Small seroma surrounding the left-sided vertical rod. 2. Edema of the bilateral L5-S1 facet joints, likely degenerative. 3. L5-S1 adjacent segment disease with multifactorial severe canal stenosis. 4. Moderate right L4-5, moderate right L5-S1, and mild left L5-S1 foraminal stenosis.   Electronically Signed   By: Kristine Garbe M.D.   On: 06/25/2017 03:16  He reports that he has been smoking cigarettes.  He has a 40.00 pack-year smoking history. he has never used smokeless tobacco.  Recent Labs    06/26/17 0439 11/27/17 1403  HGBA1C 5.5 5.1    Objective:  VS:  HT:    WT:   BMI:     BP:   HR: bpm  TEMP: ( )  RESP:  Physical Exam  Musculoskeletal:  Patient has good distal strength without clonus.  No pain with hip rotation.  Positive Fortin finger sign bilateral.    Ortho Exam Imaging: No results found.  Past Medical/Family/Surgical/Social History: Medications & Allergies reviewed per EMR Patient Active Problem List   Diagnosis Date Noted  . AKI (acute kidney injury) ([place]) 11/27/2017  . Acute urinary retention 11/27/2017  . Anemia due to blood loss 06/30/2017    Class: Acute  . Diabetes mellitus without complication ([place]) 70/35/0093    Class: Chronic  . Spinal stenosis of lumbar region 06/29/2017    Class: Chronic  . Cauda equina compression ([place]) 06/29/2017    Class: Acute  . Spondylolisthesis, lumbar region 06/29/2017    Class: Chronic  . History of lumbar spinal fusion 06/26/2017  .  Myelopathy ([place]) 06/24/2017  . Low back pain 06/24/2017  . NPH (normal pressure hydrocephalus) 10/30/2016  . Normal pressure hydrocephalus 09/01/2016  . Bilateral sciatica 09/01/2016  . Altered mental status 07/01/2016  . Acute encephalopathy 07/01/2016  . Hydrocephalus in adult 07/01/2016  . Shaking spells 06/01/2016  . Gait disturbance 04/10/2015   . Numbness 04/10/2015  . Lumbar radicular pain 04/10/2015  . Lumbar spondylosis 04/10/2015  . Bipolar I disorder with depression, severe ([place]) 06/30/2014  . Suicidal ideations 06/30/2014  . Ulcer of lower limb, unspecified 02/20/2014  . CELLULITIS AND ABSCESS OF LEG EXCEPT FOOT 11/08/2007   Past Medical History:  Diagnosis Date  . Anxiety   . Bipolar disorder ([place])   . Chronic back pain   . Depression   . Diabetes mellitus without complication ([place])   . Diabetic neuropathy ([place])   . DVT (deep venous thrombosis) ([place])   . Enlarged prostate   . Headache   . Hepatitis    Hepatitis C - has been treated with Harvoni (cleared in July, 2017)  . History of colon polyps   . Mental disorder    bipolar  . Peripheral vascular disease ([place])   . Pneumonia    hx of-about 79yrs ago  . Schizophrenia ([place])    Family History  Problem Relation Age of Onset  . Heart attack Father   . Anesthesia problems Neg Hx   . Hypotension Neg Hx   . Malignant hyperthermia Neg Hx   . Pseudochol deficiency Neg Hx    Past Surgical History:  Procedure Laterality Date  . APPENDECTOMY     early 42's  . BACK SURGERY  05/2011   x 5  . blood clot in groin  early 80's  . CARPAL TUNNEL RELEASE     left  . CERVICAL SPINE SURGERY  2009  . COLONOSCOPY    . HERNIA REPAIR     early 2000's  . SHOULDER SURGERY  70's   right  . VENTRICULOPERITONEAL SHUNT N/A 10/30/2016   Procedure: Right Ventricular Peritoneal Shunt;  Surgeon: Kary Kos, MD;  Location: [facility];  Service: Neurosurgery;  Laterality: N/A;   Social History   Occupational History  . Not on file  Tobacco Use  . Smoking status: Current Every Day Smoker    Packs/day: 1.00    Years: 40.00    Pack years: 40.00    Types: Cigarettes  . Smokeless tobacco: Never Used  . Tobacco comment: pt states that he is ready to quit smoking but thinks he will need patches advised him to speak with his PCP which he is seaing this Thursday 3/26  Substance and Sexual  Activity  . Alcohol use: No  . Drug use: No  . Sexual activity: Yes

## 2017-11-18 ENCOUNTER — Ambulatory Visit
Admission: RE | Admit: 2017-11-18 | Discharge: 2017-11-18 | Disposition: A | Discharge status: Home / Self Care | Payer: Medicare Other | Source: Ambulatory Visit | Attending: Neurosurgery | Admitting: Neurosurgery

## 2017-11-18 DIAGNOSIS — G912 (Idiopathic) normal pressure hydrocephalus: Secondary | ICD-10-CM

## 2017-11-27 ENCOUNTER — Emergency Department (HOSPITAL_COMMUNITY): Payer: Medicare Other

## 2017-11-27 ENCOUNTER — Encounter (HOSPITAL_COMMUNITY): Payer: Self-pay

## 2017-11-27 ENCOUNTER — Inpatient Hospital Stay (HOSPITAL_COMMUNITY)
Admission: EM | Admit: 2017-11-27 | Discharge: 2017-11-29 | DRG: 682 | Disposition: A | Discharge status: Home / Self Care | Payer: Medicare Other | Attending: Family Medicine | Admitting: Family Medicine

## 2017-11-27 DIAGNOSIS — F419 Anxiety disorder, unspecified: Secondary | ICD-10-CM | POA: Diagnosis present

## 2017-11-27 DIAGNOSIS — J9811 Atelectasis: Secondary | ICD-10-CM | POA: Diagnosis present

## 2017-11-27 DIAGNOSIS — G8929 Other chronic pain: Secondary | ICD-10-CM | POA: Diagnosis present

## 2017-11-27 DIAGNOSIS — M549 Dorsalgia, unspecified: Secondary | ICD-10-CM | POA: Diagnosis present

## 2017-11-27 DIAGNOSIS — F17210 Nicotine dependence, cigarettes, uncomplicated: Secondary | ICD-10-CM | POA: Diagnosis present

## 2017-11-27 DIAGNOSIS — J189 Pneumonia, unspecified organism: Secondary | ICD-10-CM

## 2017-11-27 DIAGNOSIS — N179 Acute kidney failure, unspecified: Principal | ICD-10-CM | POA: Diagnosis present

## 2017-11-27 DIAGNOSIS — Z23 Encounter for immunization: Secondary | ICD-10-CM

## 2017-11-27 DIAGNOSIS — F319 Bipolar disorder, unspecified: Secondary | ICD-10-CM | POA: Diagnosis present

## 2017-11-27 DIAGNOSIS — R338 Other retention of urine: Secondary | ICD-10-CM | POA: Diagnosis present

## 2017-11-27 DIAGNOSIS — F314 Bipolar disorder, current episode depressed, severe, without psychotic features: Secondary | ICD-10-CM | POA: Diagnosis not present

## 2017-11-27 DIAGNOSIS — Z79899 Other long term (current) drug therapy: Secondary | ICD-10-CM

## 2017-11-27 DIAGNOSIS — I739 Peripheral vascular disease, unspecified: Secondary | ICD-10-CM | POA: Diagnosis present

## 2017-11-27 DIAGNOSIS — G9340 Encephalopathy, unspecified: Secondary | ICD-10-CM | POA: Diagnosis not present

## 2017-11-27 DIAGNOSIS — Z880 Allergy status to penicillin: Secondary | ICD-10-CM | POA: Diagnosis not present

## 2017-11-27 DIAGNOSIS — I959 Hypotension, unspecified: Secondary | ICD-10-CM | POA: Diagnosis present

## 2017-11-27 DIAGNOSIS — Z8659 Personal history of other mental and behavioral disorders: Secondary | ICD-10-CM | POA: Diagnosis not present

## 2017-11-27 DIAGNOSIS — Z982 Presence of cerebrospinal fluid drainage device: Secondary | ICD-10-CM | POA: Diagnosis not present

## 2017-11-27 DIAGNOSIS — Z8619 Personal history of other infectious and parasitic diseases: Secondary | ICD-10-CM | POA: Diagnosis not present

## 2017-11-27 DIAGNOSIS — F209 Schizophrenia, unspecified: Secondary | ICD-10-CM | POA: Diagnosis present

## 2017-11-27 DIAGNOSIS — Z7984 Long term (current) use of oral hypoglycemic drugs: Secondary | ICD-10-CM | POA: Diagnosis not present

## 2017-11-27 DIAGNOSIS — G4700 Insomnia, unspecified: Secondary | ICD-10-CM | POA: Diagnosis present

## 2017-11-27 DIAGNOSIS — G92 Toxic encephalopathy: Secondary | ICD-10-CM | POA: Diagnosis present

## 2017-11-27 DIAGNOSIS — E1151 Type 2 diabetes mellitus with diabetic peripheral angiopathy without gangrene: Secondary | ICD-10-CM | POA: Diagnosis present

## 2017-11-27 DIAGNOSIS — J181 Lobar pneumonia, unspecified organism: Secondary | ICD-10-CM

## 2017-11-27 DIAGNOSIS — N401 Enlarged prostate with lower urinary tract symptoms: Secondary | ICD-10-CM | POA: Diagnosis present

## 2017-11-27 DIAGNOSIS — T4395XA Adverse effect of unspecified psychotropic drug, initial encounter: Secondary | ICD-10-CM | POA: Diagnosis present

## 2017-11-27 DIAGNOSIS — E1140 Type 2 diabetes mellitus with diabetic neuropathy, unspecified: Secondary | ICD-10-CM | POA: Diagnosis present

## 2017-11-27 DIAGNOSIS — E119 Type 2 diabetes mellitus without complications: Secondary | ICD-10-CM | POA: Diagnosis not present

## 2017-11-27 LAB — RAPID URINE DRUG SCREEN, HOSP PERFORMED
AMPHETAMINES: POSITIVE — AB
BARBITURATES: NOT DETECTED
Benzodiazepines: NOT DETECTED
COCAINE: POSITIVE — AB
OPIATES: NOT DETECTED
TETRAHYDROCANNABINOL: POSITIVE — AB

## 2017-11-27 LAB — I-STAT CHEM 8, ED
BUN: 32 mg/dL — ABNORMAL HIGH (ref 6–20)
CALCIUM ION: 1.23 mmol/L (ref 1.15–1.40)
Chloride: 101 mmol/L (ref 101–111)
Creatinine, Ser: 2.6 mg/dL — ABNORMAL HIGH (ref 0.61–1.24)
GLUCOSE: 124 mg/dL — AB (ref 65–99)
HCT: 37 % — ABNORMAL LOW (ref 39.0–52.0)
Hemoglobin: 12.6 g/dL — ABNORMAL LOW (ref 13.0–17.0)
Potassium: 4.1 mmol/L (ref 3.5–5.1)
SODIUM: 138 mmol/L (ref 135–145)
TCO2: 25 mmol/L (ref 22–32)

## 2017-11-27 LAB — CBC WITH DIFFERENTIAL/PLATELET
Basophils Absolute: 0 10*3/uL (ref 0.0–0.1)
Basophils Relative: 0 %
EOS ABS: 0.2 10*3/uL (ref 0.0–0.7)
Eosinophils Relative: 2 %
HEMATOCRIT: 35.6 % — AB (ref 39.0–52.0)
HEMOGLOBIN: 12.5 g/dL — AB (ref 13.0–17.0)
LYMPHS ABS: 2.9 10*3/uL (ref 0.7–4.0)
LYMPHS PCT: 27 %
MCH: 32.5 pg (ref 26.0–34.0)
MCHC: 35.1 g/dL (ref 30.0–36.0)
MCV: 92.5 fL (ref 78.0–100.0)
MONOS PCT: 10 %
Monocytes Absolute: 1.1 10*3/uL — ABNORMAL HIGH (ref 0.1–1.0)
NEUTROS PCT: 61 %
Neutro Abs: 6.6 10*3/uL (ref 1.7–7.7)
Platelets: 213 10*3/uL (ref 150–400)
RBC: 3.85 MIL/uL — ABNORMAL LOW (ref 4.22–5.81)
RDW: 12.4 % (ref 11.5–15.5)
WBC: 10.8 10*3/uL — ABNORMAL HIGH (ref 4.0–10.5)

## 2017-11-27 LAB — COMPREHENSIVE METABOLIC PANEL
ALT: 14 U/L — AB (ref 17–63)
AST: 21 U/L (ref 15–41)
Albumin: 4 g/dL (ref 3.5–5.0)
Alkaline Phosphatase: 96 U/L (ref 38–126)
Anion gap: 10 (ref 5–15)
BUN: 30 mg/dL — ABNORMAL HIGH (ref 6–20)
CHLORIDE: 102 mmol/L (ref 101–111)
CO2: 24 mmol/L (ref 22–32)
CREATININE: 2.82 mg/dL — AB (ref 0.61–1.24)
Calcium: 9.5 mg/dL (ref 8.9–10.3)
GFR calc non Af Amer: 23 mL/min — ABNORMAL LOW (ref >60)
GFR, EST AFRICAN AMERICAN: 26 mL/min — AB (ref >60)
Glucose, Bld: 123 mg/dL — ABNORMAL HIGH (ref 65–99)
POTASSIUM: 3.8 mmol/L (ref 3.5–5.1)
SODIUM: 136 mmol/L (ref 135–145)
Total Bilirubin: 0.7 mg/dL (ref 0.3–1.2)
Total Protein: 7.7 g/dL (ref 6.5–8.1)

## 2017-11-27 LAB — CBC
HCT: 36 % — ABNORMAL LOW (ref 39.0–52.0)
Hemoglobin: 12.2 g/dL — ABNORMAL LOW (ref 13.0–17.0)
MCH: 31.6 pg (ref 26.0–34.0)
MCHC: 33.9 g/dL (ref 30.0–36.0)
MCV: 93.3 fL (ref 78.0–100.0)
PLATELETS: 212 10*3/uL (ref 150–400)
RBC: 3.86 MIL/uL — ABNORMAL LOW (ref 4.22–5.81)
RDW: 12.3 % (ref 11.5–15.5)
WBC: 10.4 10*3/uL (ref 4.0–10.5)

## 2017-11-27 LAB — URINALYSIS, COMPLETE (UACMP) WITH MICROSCOPIC
BILIRUBIN URINE: NEGATIVE
Glucose, UA: NEGATIVE mg/dL
HGB URINE DIPSTICK: NEGATIVE
KETONES UR: NEGATIVE mg/dL
Leukocytes, UA: NEGATIVE
Nitrite: NEGATIVE
PROTEIN: NEGATIVE mg/dL
Specific Gravity, Urine: 1.014 (ref 1.005–1.030)
pH: 5 (ref 5.0–8.0)

## 2017-11-27 LAB — CBG MONITORING, ED: GLUCOSE-CAPILLARY: 123 mg/dL — AB (ref 65–99)

## 2017-11-27 LAB — COOXEMETRY PANEL
Carboxyhemoglobin: 6.4 % (ref 0.5–1.5)
Methemoglobin: 1.3 % (ref 0.0–1.5)
O2 Saturation: 99.5 %
Total hemoglobin: 12.2 g/dL (ref 12.0–16.0)

## 2017-11-27 LAB — CREATININE, SERUM
CREATININE: 2.52 mg/dL — AB (ref 0.61–1.24)
GFR calc Af Amer: 30 mL/min — ABNORMAL LOW (ref >60)
GFR, EST NON AFRICAN AMERICAN: 26 mL/min — AB (ref >60)

## 2017-11-27 LAB — BLOOD GAS, ARTERIAL
Acid-base deficit: 2.4 mmol/L — ABNORMAL HIGH (ref 0.0–2.0)
BICARBONATE: 22.2 mmol/L (ref 20.0–28.0)
Drawn by: 270211
FIO2: 1
O2 SAT: 99.5 %
PATIENT TEMPERATURE: 98.6
PCO2 ART: 40.3 mmHg (ref 32.0–48.0)
PO2 ART: 409 mmHg — AB (ref 83.0–108.0)
pH, Arterial: 7.361 (ref 7.350–7.450)

## 2017-11-27 LAB — TROPONIN I: Troponin I: <0.03 ng/mL (ref <0.03)

## 2017-11-27 LAB — HEMOGLOBIN A1C
Hgb A1c MFr Bld: 5.1 % (ref 4.8–5.6)
Mean Plasma Glucose: 99.67 mg/dL

## 2017-11-27 LAB — TSH: TSH: 0.662 u[IU]/mL (ref 0.350–4.500)

## 2017-11-27 LAB — AMMONIA: AMMONIA: 44 umol/L — AB (ref 9–35)

## 2017-11-27 LAB — ETHANOL: Alcohol, Ethyl (B): <10 mg/dL (ref <10)

## 2017-11-27 LAB — GLUCOSE, CAPILLARY: Glucose-Capillary: 105 mg/dL — ABNORMAL HIGH (ref 65–99)

## 2017-11-27 LAB — I-STAT CG4 LACTIC ACID, ED: Lactic Acid, Venous: 1.49 mmol/L (ref 0.5–1.9)

## 2017-11-27 MED ORDER — INSULIN ASPART 100 UNIT/ML ~~LOC~~ SOLN
0.0000 [IU] | SUBCUTANEOUS | Status: DC
  Administered 2017-11-28: 1 [IU] via SUBCUTANEOUS

## 2017-11-27 MED ORDER — ONDANSETRON HCL 4 MG PO TABS: 4.0000 mg | ORAL_TABLET | Freq: Four times a day (QID) | ORAL | Status: DC | PRN

## 2017-11-27 MED ORDER — SODIUM CHLORIDE 0.9 % IV SOLN
INTRAVENOUS | Status: DC
  Administered 2017-11-27 – 2017-11-29 (×6): via INTRAVENOUS

## 2017-11-27 MED ORDER — SODIUM CHLORIDE 0.9 % IV BOLUS (SEPSIS)
1000.0000 mL | Freq: Once | INTRAVENOUS | Status: AC
  Administered 2017-11-27: 1000 mL via INTRAVENOUS

## 2017-11-27 MED ORDER — ONDANSETRON HCL 4 MG/2ML IJ SOLN: 4.0000 mg | Freq: Four times a day (QID) | INTRAMUSCULAR | Status: DC | PRN

## 2017-11-27 MED ORDER — LEVOFLOXACIN IN D5W 750 MG/150ML IV SOLN
750.0000 mg | Freq: Once | INTRAVENOUS | Status: AC
  Administered 2017-11-27: 750 mg via INTRAVENOUS
  Filled 2017-11-27: qty 150

## 2017-11-27 MED ORDER — HEPARIN SODIUM (PORCINE) 5000 UNIT/ML IJ SOLN
5000.0000 [IU] | Freq: Three times a day (TID) | INTRAMUSCULAR | Status: DC
  Administered 2017-11-27 – 2017-11-28 (×4): 5000 [IU] via SUBCUTANEOUS
  Filled 2017-11-27 (×4): qty 1

## 2017-11-27 MED ORDER — NALOXONE HCL 2 MG/2ML IJ SOSY
1.0000 mg | PREFILLED_SYRINGE | Freq: Once | INTRAMUSCULAR | Status: AC
  Administered 2017-11-27: 1 mg via INTRAVENOUS
  Filled 2017-11-27: qty 2

## 2017-11-27 MED ORDER — ACETAMINOPHEN 650 MG RE SUPP: 650.0000 mg | Freq: Four times a day (QID) | RECTAL | Status: DC | PRN

## 2017-11-27 MED ORDER — ACETAMINOPHEN 325 MG PO TABS: 650.0000 mg | ORAL_TABLET | Freq: Four times a day (QID) | ORAL | Status: DC | PRN

## 2017-11-27 NOTE — ED Notes (Signed)
Bed: WA10 Expected date:  Expected time:  Means of arrival:  Comments: 

## 2017-11-27 NOTE — ED Provider Notes (Signed)
[facility] DEPT Provider Note   CSN: [id number] Arrival date & time: 11/27/17  0539   LEVEL 5 CAVEAT - ALTERED MENTAL STATUS  History   Chief Complaint Chief Complaint  Patient presents with  . Weakness    HPI IZEL EISENHARDT is a 61 y.o. male.  HPI  61 year old male brought in by EMS for altered mental status.  The history is quite limited as the patient is altered.  I did not talk to EMS as they are gone prior to me seeing the patient.  However the nurse states that friends called EMS and stated that he has been lethargic, progressively so, for the last 3 days.  EMS got a CO reading of 21% and placed him on oxygen.  Initially had low blood pressure of 80/43 but this has improved.  He is currently on a nonrebreather.  The patient does not provide much history but does answer yes and no questions.  He does endorse weakness but cannot tell me if it is unilateral or diffuse.  When asked about specific areas of pain such as headache, chest pain, or shortness of breath he says no.  Past Medical History:  Diagnosis Date  . Anxiety   . Bipolar disorder ([place])   . Chronic back pain   . Depression   . Diabetes mellitus without complication ([place])   . Diabetic neuropathy ([place])   . DVT (deep venous thrombosis) ([place])   . Enlarged prostate   . Headache   . Hepatitis    Hepatitis C - has been treated with Harvoni (cleared in July, 2017)  . History of colon polyps   . Mental disorder    bipolar  . Peripheral vascular disease ([place])   . Pneumonia    hx of-about 44yrs ago  . Schizophrenia [facility])     Patient Active Problem List   Diagnosis Date Noted  . AKI (acute kidney injury) ([place]) 11/27/2017  . Anemia due to blood loss 06/30/2017    Class: Acute  . Diabetes mellitus without complication ([place]) 76/73/4193    Class: Chronic  . Spinal stenosis of lumbar region 06/29/2017    Class: Chronic  . Cauda equina compression ([place]) 06/29/2017    Class: Acute  .  Spondylolisthesis, lumbar region 06/29/2017    Class: Chronic  . History of lumbar spinal fusion 06/26/2017  . Myelopathy ([place]) 06/24/2017  . Low back pain 06/24/2017  . NPH (normal pressure hydrocephalus) 10/30/2016  . Normal pressure hydrocephalus 09/01/2016  . Bilateral sciatica 09/01/2016  . Altered mental status 07/01/2016  . Acute encephalopathy 07/01/2016  . Hydrocephalus in adult 07/01/2016  . Shaking spells 06/01/2016  . Gait disturbance 04/10/2015  . Numbness 04/10/2015  . Lumbar radicular pain 04/10/2015  . Lumbar spondylosis 04/10/2015  . Bipolar I disorder with depression, severe ([place]) 06/30/2014  . Suicidal ideations 06/30/2014  . Ulcer of lower limb, unspecified 02/20/2014  . CELLULITIS AND ABSCESS OF LEG EXCEPT FOOT 11/08/2007    Past Surgical History:  Procedure Laterality Date  . APPENDECTOMY     early 61's  . BACK SURGERY  05/2011   x 5  . blood clot in groin  early 80's  . CARPAL TUNNEL RELEASE     left  . CERVICAL SPINE SURGERY  2009  . COLONOSCOPY    . HERNIA REPAIR     early 2000's  . SHOULDER SURGERY  70's   right  . VENTRICULOPERITONEAL SHUNT N/A 10/30/2016   Procedure: Right Ventricular Peritoneal Shunt;  Surgeon: Kary Kos, MD;  Location: [facility];  Service: Neurosurgery;  Laterality: N/A;       Home Medications    Prior to Admission medications   Medication Sig Start Date End Date Taking? Authorizing Provider  cholecalciferol (VITAMIN D) 1000 units tablet Take 1,000 Units by mouth daily.   Yes [provider]  celecoxib (CELEBREX) 200 MG capsule Take 1 capsule (200 mg total) by mouth daily. Patient not taking: Reported on 09/16/2017 06/29/17   [name], MD  cyclobenzaprine (FLEXERIL) 10 MG tablet TAKE 1 TABLET BY MOUTH 3 TIMES A DAY AS NEEDED FOR MUSCLE SPASMS 06/06/17   [provider]  diazepam (VALIUM) 2 MG tablet Take 1 tablet (2 mg total) by mouth every 8 (eight) hours as needed for muscle spasms. Patient not  taking: Reported on 09/16/2017 06/29/17   [name], MD  docusate sodium (COLACE) 100 MG capsule Take 1 capsule (100 mg total) by mouth 2 (two) times daily. Patient not taking: Reported on 09/16/2017 06/29/17   [name], MD  EASY TOUCH PEN NEEDLES 29G X 12MM [place]  07/21/17   [provider]  ferrous gluconate (FERGON) 324 MG tablet Take 1 tablet (324 mg total) by mouth 2 (two) times daily with a meal. 06/30/17   [name], MD  gabapentin (NEURONTIN) 300 MG capsule Take 300 mg by mouth at bedtime.  06/11/17   [provider]  gabapentin (NEURONTIN) 300 MG capsule Take 1 capsule (300 mg total) by mouth at bedtime. Patient not taking: Reported on 09/16/2017 06/29/17   [name], MD  HYDROcodone-acetaminophen (NORCO/VICODIN) 5-325 MG tablet Take 1 tablet by mouth 3 (three) times daily as needed for severe pain. 09/16/17   Lanae Crumbly, PA-C  ibuprofen (ADVIL,MOTRIN) 600 MG tablet  05/27/17   [provider]  LEVEMIR FLEXTOUCH 100 UNIT/ML Pen  07/21/17   [provider]  metFORMIN (GLUCOPHAGE) 500 MG tablet Take 500 mg by mouth daily with breakfast.     [provider]  naproxen (NAPROSYN) 500 MG tablet Take 1 tablet (500 mg total) by mouth 2 (two) times daily. Patient not taking: Reported on 09/16/2017 08/29/17   Tanna Furry, MD  naproxen (NAPROSYN) 500 MG tablet Take 1 tablet (500 mg total) by mouth 2 (two) times daily. Patient not taking: Reported on 09/16/2017 08/29/17   Tanna Furry, MD  oxyCODONE-acetaminophen (PERCOCET/ROXICET) 5-325 MG tablet Take 1-2 tablets by mouth every 6 (six) hours as needed for severe pain. Patient not taking: Reported on 09/16/2017 07/23/17   [name], MD  paliperidone (INVEGA) 3 MG 24 hr tablet Take 3 mg by mouth at bedtime.  04/22/17   [provider]  paliperidone (INVEGA) 3 MG 24 hr tablet Take 1 tablet (3 mg total) by mouth at bedtime. 06/29/17   [name], MD  QUEtiapine (SEROQUEL) 400 MG  tablet Take 800 mg by mouth at bedtime. 05/14/17   [provider]  QUEtiapine (SEROQUEL) 400 MG tablet Take 2 tablets (800 mg total) by mouth at bedtime. Patient not taking: Reported on 09/16/2017 06/29/17   [name], MD  risperiDONE (RISPERDAL) 1 MG tablet Take 1 tablet (1 mg total) by mouth 2 (two) times daily. Patient not taking: Reported on 09/16/2017 07/05/16   Elgergawy, Silver Huguenin, MD  sertraline (ZOLOFT) 100 MG tablet Take 100 mg by mouth at bedtime.    [provider]  sitaGLIPtin (JANUVIA) 100 MG tablet Take 100 mg by mouth  daily with breakfast.    [provider]  tamsulosin (FLOMAX) 0.4 MG CAPS capsule Take 0.4 mg by mouth daily after supper.     [provider]  traZODone (DESYREL) 150 MG tablet Take 150 mg by mouth at bedtime. 04/22/17   [provider]  traZODone (DESYREL) 150 MG tablet Take 1 tablet (150 mg total) by mouth at bedtime. Patient not taking: Reported on 09/16/2017 06/29/17   [name], MD    Family History Family History  Problem Relation Age of Onset  . Heart attack Father   . Anesthesia problems Neg Hx   . Hypotension Neg Hx   . Malignant hyperthermia Neg Hx   . Pseudochol deficiency Neg Hx     Social History Social History   Tobacco Use  . Smoking status: Current Every Day Smoker    Packs/day: 1.00    Years: 40.00    Pack years: 40.00    Types: Cigarettes  . Smokeless tobacco: Never Used  . Tobacco comment: pt states that he is ready to quit smoking but thinks he will need patches advised him to speak with his PCP which he is seaing this Thursday 3/26  Substance Use Topics  . Alcohol use: No  . Drug use: No     Allergies   Penicillins   Review of Systems Review of Systems  Unable to perform ROS: Mental status change     Physical Exam Updated Vital Signs BP 135/65   Pulse 95   Temp 98.7 F (37.1 C) (Rectal)   Resp 11   SpO2 97%   Physical Exam  Constitutional: He appears  well-developed and well-nourished. He appears lethargic.  On a NRB  HENT:  Head: Normocephalic and atraumatic.  Right Ear: External ear normal.  Left Ear: External ear normal.  Nose: Nose normal.  Eyes: Pupils are equal, round, and reactive to light. Right eye exhibits no discharge. Left eye exhibits no discharge.  Neck: Neck supple.  Cardiovascular: Normal rate, regular rhythm and normal heart sounds.  Pulmonary/Chest: Effort normal and breath sounds normal. He has no wheezes.  Abdominal: Soft. There is no tenderness.  Musculoskeletal: He exhibits no edema.  Neurological: He appears lethargic. GCS eye subscore is 3. GCS verbal subscore is 4. GCS motor subscore is 6.  Lethargic but awakens to speech. However he will quickly close his eyes again. Good grip strength bilaterally. When asked to move legs he does so weakly and does not get them off the stretcher.   Skin: Skin is warm and dry. He is not diaphoretic.  Nursing note and vitals reviewed.    ED Treatments / Results  Labs (all labs ordered are listed, but only abnormal results are displayed) Labs Reviewed  COOXEMETRY PANEL - Abnormal; Notable for the following components:      Result Value   Carboxyhemoglobin 6.4 (*)    All other components within normal limits  COMPREHENSIVE METABOLIC PANEL - Abnormal; Notable for the following components:   Glucose, Bld 123 (*)    BUN 30 (*)    Creatinine, Ser 2.82 (*)    ALT 14 (*)    GFR calc non Af Amer 23 (*)    GFR calc Af Amer 26 (*)    All other components within normal limits  CBC WITH DIFFERENTIAL/PLATELET - Abnormal; Notable for the following components:   WBC 10.8 (*)    RBC 3.85 (*)    Hemoglobin 12.5 (*)    HCT 35.6 (*)  Monocytes Absolute 1.1 (*)    All other components within normal limits  AMMONIA - Abnormal; Notable for the following components:   Ammonia 44 (*)    All other components within normal limits  BLOOD GAS, ARTERIAL - Abnormal; Notable for the  following components:   pO2, Arterial 409 (*)    Acid-base deficit 2.4 (*)    All other components within normal limits  RAPID URINE DRUG SCREEN, HOSP PERFORMED - Abnormal; Notable for the following components:   Cocaine POSITIVE (*)    Amphetamines POSITIVE (*)    Tetrahydrocannabinol POSITIVE (*)    All other components within normal limits  CBG MONITORING, ED - Abnormal; Notable for the following components:   Glucose-Capillary 123 (*)    All other components within normal limits  I-STAT CHEM 8, ED - Abnormal; Notable for the following components:   BUN 32 (*)    Creatinine, Ser 2.60 (*)    Glucose, Bld 124 (*)    Hemoglobin 12.6 (*)    HCT 37.0 (*)    All other components within normal limits  CULTURE, BLOOD (ROUTINE X 2)  CULTURE, BLOOD (ROUTINE X 2)  TROPONIN I  ETHANOL  URINALYSIS, COMPLETE (UACMP) WITH MICROSCOPIC  I-STAT CG4 LACTIC ACID, ED    EKG  EKG Interpretation  Date/Time:  Saturday November 27 2017 08:27:47 EST Ventricular Rate:  89 PR Interval:    QRS Duration: 84 QT Interval:  363 QTC Calculation: 442 R Axis:   65 Text Interpretation:  Sinus rhythm Probable lateral infarct, age indeterminate Lateral Q waves new since July 2018 Confirmed by Sherwood Gambler [phone]) on 11/27/2017 8:32:00 AM       Radiology Ct Head Wo Contrast  Result Date: 11/27/2017 CLINICAL DATA:  Lethargy. EXAM: CT HEAD WITHOUT CONTRAST TECHNIQUE: Contiguous axial images were obtained from the base of the skull through the vertex without intravenous contrast. COMPARISON:  11/18/2017 FINDINGS: Brain: There is no evidence for acute hemorrhage, hydrocephalus, mass lesion, or abnormal extra-axial fluid collection. No definite CT evidence for acute infarction. Right parietal ventriculostomy shunt catheter again noted. Stable appearance of the lateral ventricles. The periventricular hypoattenuation is similar to prior study. Similar appearance of sulcal effacement at the vertex. Vascular: No  hyperdense vessel or unexpected calcification. Skull: No evidence for fracture. No worrisome lytic or sclerotic lesion. Sinuses/Orbits: The visualized paranasal sinuses and mastoid air cells are clear. Visualized portions of the globes and intraorbital fat are unremarkable. Other: None. IMPRESSION: 1. Stable exam.  No acute or progressive interval findings. Electronically Signed   By: Misty Stanley M.D.   On: 11/27/2017 09:36   Dg Chest Port 1 View  Result Date: 11/27/2017 CLINICAL DATA:  Altered mental status. EXAM: PORTABLE CHEST 1 VIEW COMPARISON:  05/24/2017 FINDINGS: Apical lordotic positioning. Lower cervical spine fixation. Midline trachea. Borderline cardiomegaly. Small left pleural effusion is suspected. No pneumothorax. New left base airspace disease. Minimal volume loss at the right lung base. IMPRESSION: New left base airspace disease with probable small layering left pleural effusion. This airspace disease could represent atelectasis or infection/aspiration. Electronically Signed   By: Abigail Miyamoto M.D.   On: 11/27/2017 09:22    Procedures Procedures (including critical care time)  Medications Ordered in ED Medications  naloxone [facility]) injection 1 mg (1 mg Intravenous Given 11/27/17 0845)  sodium chloride 0.9 % bolus 1,000 mL (1,000 mLs Intravenous New Bag/Given 11/27/17 0926)  levofloxacin (LEVAQUIN) IVPB 750 mg (750 mg Intravenous New Bag/Given 11/27/17 1004)     Initial  Impression / Assessment and Plan / ED Course  I have reviewed the triage vital signs and the nursing notes.  Pertinent labs & imaging results that were available during my care of the patient were reviewed by me and considered in my medical decision making (see chart for details).     Patient's mental status has significantly improved throughout his ED stay.  However does not seem directly correlated to the IV Narcan given.  He was brought in as a possible carbon monoxide poisoning but his CO level is only  6 here after being on oxygen for less than an hour.  Thus I do not think this is recovering from the CO and rather this is probably related to chronic smoking.  His workup shows left-sided pneumonia.  He is not hypoxic but does have renal failure which could be from urinary retention.  He has a Foley catheter with over 300 cc out.  He is now more awake and is able to tell me he has been coughing severely over the last few days.  Has had trouble urinating.  His initial altered mental status may be due to medications, especially with likely poor removal from renal failure.  Admit to the hospitalist.  Final Clinical Impressions(s) / ED Diagnoses   Final diagnoses:  AKI (acute kidney injury) ([place])  Community acquired pneumonia of left lower lobe of lung [facility])    ED Discharge Orders    None       Sherwood Gambler, MD 11/27/17 1149

## 2017-11-27 NOTE — ED Triage Notes (Signed)
Patient arrive via [facility] from home. Patient called our for being lethargic. Patient has high CO reading 21%. Friends states patient has got increasing lethargic the past few days. Patient on responsive to verbal. Initial BP-80/43, now BP- 130/80, HR-90, 100% Nonrebreather. 20 g IV L-hand.

## 2017-11-27 NOTE — H&P (Addendum)
History and Physical    [name] ZOX:[id number] DOB: 25-Nov-1956 DOA: 11/27/2017    PCP: Nolene Ebbs, MD  Patient coming from: home  Chief Complaint: lethargy/ confusion  HPI: [name] is a 61 y.o. male with medical history of bipolar disorder who is brought in from home by friends due to lethargy and confusion. BP was low at 83/43. He is still quite sleepy and not able to give me a history despite repeated attempt by myself and the nurse to keep him awake. Per nurse, he has been getting drowsy over the past 3 days. Apparently, he lives alone and a neighbor checks on him to make sure he takes his medications every day.  It was initially suspected that he may have carbon monoxide poisoning as level when checked by EMS was high but in the ER is was rechecked and found to be lower  ED Course:   bmet shows AKI,  UDS + for cocaine, cannabis and amphetamines CXR shows left basilar atelectasis vs pneumonia U retention noted with > 500 cc urine out after a foley  Review of Systems:  All other systems reviewed and apart from HPI, are negative.  Past Medical History:  Diagnosis Date  . Anxiety   . Bipolar disorder ([place])   . Chronic back pain   . Depression   . Diabetes mellitus without complication ([place])   . Diabetic neuropathy ([place])   . DVT (deep venous thrombosis) ([place])   . Enlarged prostate   . Headache   . Hepatitis    Hepatitis C - has been treated with Harvoni (cleared in July, 2017)  . History of colon polyps   . Mental disorder    bipolar  . Peripheral vascular disease ([place])   . Pneumonia    hx of-about 6yrs ago  . Schizophrenia [facility])     Past Surgical History:  Procedure Laterality Date  . APPENDECTOMY     early 25's  . BACK SURGERY  05/2011   x 5  . blood clot in groin  early 80's  . CARPAL TUNNEL RELEASE     left  . CERVICAL SPINE SURGERY  2009  . COLONOSCOPY    . HERNIA REPAIR     early 2000's  . SHOULDER SURGERY  70's   right  .  VENTRICULOPERITONEAL SHUNT N/A 10/30/2016   Procedure: Right Ventricular Peritoneal Shunt;  Surgeon: Kary Kos, MD;  Location: [facility];  Service: Neurosurgery;  Laterality: N/A;    Social History:   reports that he has been smoking cigarettes.  He has a 40.00 pack-year smoking history. he has never used smokeless tobacco. He reports that he does not drink alcohol or use drugs.  Allergies  Allergen Reactions  . Penicillins Hives and Other (See Comments)    Has patient had a PCN reaction causing immediate rash, facial/tongue/throat swelling, SOB or lightheadedness with hypotension: No Has patient had a PCN reaction causing severe rash involving mucus membranes or skin necrosis: No Has patient had a PCN reaction that required hospitalization No Has patient had a PCN reaction occurring within the last 10 years: No If all of the above answers are "NO", then may proceed with Cephalosporin use.    Family History  Problem Relation Age of Onset  . Heart attack Father   . Anesthesia problems Neg Hx   . Hypotension Neg Hx   . Malignant hyperthermia Neg Hx   . Pseudochol deficiency Neg Hx      Prior to Admission  medications   Medication Sig Start Date End Date Taking? Authorizing Provider  cholecalciferol (VITAMIN D) 1000 units tablet Take 1,000 Units by mouth daily.   Yes [provider]  celecoxib (CELEBREX) 200 MG capsule Take 1 capsule (200 mg total) by mouth daily. Patient not taking: Reported on 09/16/2017 06/29/17   [name], MD  cyclobenzaprine (FLEXERIL) 10 MG tablet TAKE 1 TABLET BY MOUTH 3 TIMES A DAY AS NEEDED FOR MUSCLE SPASMS 06/06/17   [provider]  diazepam (VALIUM) 2 MG tablet Take 1 tablet (2 mg total) by mouth every 8 (eight) hours as needed for muscle spasms. Patient not taking: Reported on 09/16/2017 06/29/17   [name], MD  docusate sodium (COLACE) 100 MG capsule Take 1 capsule (100 mg total) by mouth 2 (two) times daily. Patient not taking:  Reported on 09/16/2017 06/29/17   [name], MD  EASY TOUCH PEN NEEDLES 29G X 12MM [place]  07/21/17   [provider]  ferrous gluconate (FERGON) 324 MG tablet Take 1 tablet (324 mg total) by mouth 2 (two) times daily with a meal. 06/30/17   [name], MD  gabapentin (NEURONTIN) 300 MG capsule Take 300 mg by mouth at bedtime.  06/11/17   [provider]  gabapentin (NEURONTIN) 300 MG capsule Take 1 capsule (300 mg total) by mouth at bedtime. Patient not taking: Reported on 09/16/2017 06/29/17   [name], MD  HYDROcodone-acetaminophen (NORCO/VICODIN) 5-325 MG tablet Take 1 tablet by mouth 3 (three) times daily as needed for severe pain. 09/16/17   Lanae Crumbly, PA-C  ibuprofen (ADVIL,MOTRIN) 600 MG tablet  05/27/17   [provider]  LEVEMIR FLEXTOUCH 100 UNIT/ML Pen  07/21/17   [provider]  metFORMIN (GLUCOPHAGE) 500 MG tablet Take 500 mg by mouth daily with breakfast.     [provider]  naproxen (NAPROSYN) 500 MG tablet Take 1 tablet (500 mg total) by mouth 2 (two) times daily. Patient not taking: Reported on 09/16/2017 08/29/17   Tanna Furry, MD  naproxen (NAPROSYN) 500 MG tablet Take 1 tablet (500 mg total) by mouth 2 (two) times daily. Patient not taking: Reported on 09/16/2017 08/29/17   Tanna Furry, MD  oxyCODONE-acetaminophen (PERCOCET/ROXICET) 5-325 MG tablet Take 1-2 tablets by mouth every 6 (six) hours as needed for severe pain. Patient not taking: Reported on 09/16/2017 07/23/17   [name], MD  paliperidone (INVEGA) 3 MG 24 hr tablet Take 3 mg by mouth at bedtime.  04/22/17   [provider]  paliperidone (INVEGA) 3 MG 24 hr tablet Take 1 tablet (3 mg total) by mouth at bedtime. 06/29/17   [name], MD  QUEtiapine (SEROQUEL) 400 MG tablet Take 800 mg by mouth at bedtime. 05/14/17   [provider]  QUEtiapine (SEROQUEL) 400 MG tablet Take 2 tablets (800 mg total) by mouth at bedtime. Patient not taking:  Reported on 09/16/2017 06/29/17   [name], MD  risperiDONE (RISPERDAL) 1 MG tablet Take 1 tablet (1 mg total) by mouth 2 (two) times daily. Patient not taking: Reported on 09/16/2017 07/05/16   Elgergawy, Silver Huguenin, MD  sertraline (ZOLOFT) 100 MG tablet Take 100 mg by mouth at bedtime.    [provider]  sitaGLIPtin (JANUVIA) 100 MG tablet Take 100 mg by mouth daily with breakfast.    [provider]  tamsulosin (FLOMAX) 0.4 MG CAPS capsule Take 0.4 mg by mouth daily after supper.  [provider]  traZODone (DESYREL) 150 MG tablet Take 150 mg by mouth at bedtime. 04/22/17   [provider]  traZODone (DESYREL) 150 MG tablet Take 1 tablet (150 mg total) by mouth at bedtime. Patient not taking: Reported on 09/16/2017 06/29/17   [name], MD    Physical Exam: Wt Readings from Last 3 Encounters:  10/10/17 85.7 kg (189 lb)  09/16/17 86.2 kg (190 lb)  08/29/17 86.2 kg (190 lb)   Vitals:   11/27/17 1200 11/27/17 1230 11/27/17 1300 11/27/17 1330  BP: 112/63 135/77 124/74 122/68  Pulse: 94 95 92 91  Resp: 11 13 12 14   Temp:      TempSrc:      SpO2: 98% 98% 97% 96%      Constitutional: NAD, calm, comfortable Eyes: PERTLA, lids and conjunctivae normal ENMT: Mucous membranes are moist. Posterior pharynx clear of any exudate or lesions. Normal dentition.  Neck: normal, supple, no masses, no thyromegaly Respiratory: clear to auscultation bilaterally, no wheezing, no crackles. Normal respiratory effort. No accessory muscle use.  Cardiovascular: S1 & S2 heard, regular rate and rhythm, no murmurs / rubs / gallops. No extremity edema. 2+ pedal pulses. No carotid bruits.  Abdomen: No distension, no tenderness, no masses palpated. No hepatosplenomegaly. Bowel sounds normal.  Musculoskeletal: no clubbing / cyanosis. No joint deformity upper and lower extremities. Good ROM, no contractures. Normal muscle tone.  Skin: no rashes, lesions, ulcers. No  induration Neurologic: CN 2-12 grossly intact. Sensation intact, DTR normal. Strength 5/5 in all 4 limbs.  Psychiatric: Normal judgment and insight. Alert and oriented x 3. Normal mood.     Labs on Admission: I have personally reviewed following labs and imaging studies  CBC: Recent Labs  Lab 11/27/17 0838 11/27/17 0906  WBC 10.8*  --   NEUTROABS 6.6  --   HGB 12.5* 12.6*  HCT 35.6* 37.0*  MCV 92.5  --   PLT 213  --    Basic Metabolic Panel: Recent Labs  Lab 11/27/17 0838 11/27/17 0906  NA 136 138  K 3.8 4.1  CL 102 101  CO2 24  --   GLUCOSE 123* 124*  BUN 30* 32*  CREATININE 2.82* 2.60*  CALCIUM 9.5  --    GFR: CrCl cannot be calculated (Unknown ideal weight.). Liver Function Tests: Recent Labs  Lab 11/27/17 0838  AST 21  ALT 14*  ALKPHOS 96  BILITOT 0.7  PROT 7.7  ALBUMIN 4.0   No results for input(s): LIPASE, AMYLASE in the last 168 hours. Recent Labs  Lab 11/27/17 1022  AMMONIA 44*   Coagulation Profile: No results for input(s): INR, PROTIME in the last 168 hours. Cardiac Enzymes: Recent Labs  Lab 11/27/17 0838  TROPONINI <0.03   BNP (last 3 results) No results for input(s): PROBNP in the last 8760 hours. HbA1C: No results for input(s): HGBA1C in the last 72 hours. CBG: Recent Labs  Lab 11/27/17 0942  GLUCAP 123*   Lipid Profile: No results for input(s): CHOL, HDL, LDLCALC, TRIG, CHOLHDL, LDLDIRECT in the last 72 hours. Thyroid Function Tests: No results for input(s): TSH, T4TOTAL, FREET4, T3FREE, THYROIDAB in the last 72 hours. Anemia Panel: No results for input(s): VITAMINB12, FOLATE, FERRITIN, TIBC, IRON, RETICCTPCT in the last 72 hours. Urine analysis:    Component Value Date/Time   COLORURINE YELLOW 11/27/2017 1045   APPEARANCEUR CLOUDY (A) 11/27/2017 1045   LABSPEC 1.014 11/27/2017 1045   PHURINE 5.0 11/27/2017 1045   GLUCOSEU NEGATIVE 11/27/2017 1045  HGBUR NEGATIVE 11/27/2017 [place] 11/27/2017 [place] 11/27/2017 1045   PROTEINUR NEGATIVE 11/27/2017 1045   UROBILINOGEN 0.2 08/27/2017 1538   NITRITE NEGATIVE 11/27/2017 1045   LEUKOCYTESUR NEGATIVE 11/27/2017 1045   Sepsis Labs: @LABRCNTIP (procalcitonin:4,lacticidven:4) )No results found for this or any previous visit (from the past 240 hour(s)).   Radiological Exams on Admission: Ct Head Wo Contrast  Result Date: 11/27/2017 CLINICAL DATA:  Lethargy. EXAM: CT HEAD WITHOUT CONTRAST TECHNIQUE: Contiguous axial images were obtained from the base of the skull through the vertex without intravenous contrast. COMPARISON:  11/18/2017 FINDINGS: Brain: There is no evidence for acute hemorrhage, hydrocephalus, mass lesion, or abnormal extra-axial fluid collection. No definite CT evidence for acute infarction. Right parietal ventriculostomy shunt catheter again noted. Stable appearance of the lateral ventricles. The periventricular hypoattenuation is similar to prior study. Similar appearance of sulcal effacement at the vertex. Vascular: No hyperdense vessel or unexpected calcification. Skull: No evidence for fracture. No worrisome lytic or sclerotic lesion. Sinuses/Orbits: The visualized paranasal sinuses and mastoid air cells are clear. Visualized portions of the globes and intraorbital fat are unremarkable. Other: None. IMPRESSION: 1. Stable exam.  No acute or progressive interval findings. Electronically Signed   By: Misty Stanley M.D.   On: 11/27/2017 09:36   Dg Chest Port 1 View  Result Date: 11/27/2017 CLINICAL DATA:  Altered mental status. EXAM: PORTABLE CHEST 1 VIEW COMPARISON:  05/24/2017 FINDINGS: Apical lordotic positioning. Lower cervical spine fixation. Midline trachea. Borderline cardiomegaly. Small left pleural effusion is suspected. No pneumothorax. New left base airspace disease. Minimal volume loss at the right lung base. IMPRESSION: New left base airspace disease with probable small layering left pleural effusion.  This airspace disease could represent atelectasis or infection/aspiration. Electronically Signed   By: Abigail Miyamoto M.D.   On: 11/27/2017 09:22       Assessment/Plan Principal Problem:   AKI (acute kidney injury)  - possibly due to urinary retention- has had > 500 cc after foley placed - also on Celebrex and Ibuprofen per med rec - follow I and O and Cr closely - hold nephrotoxic meds - check a renal ultrasound  Active Problems:    Acute encephalopathy - lethargic, not obtunded- appears to be protecting airway - may be secondary to accumulation of psych medications in setting of AKI - med rec states he is on Neurontin, Hydrocodone, Valium,  Flexeril, Oxycodone, Seroquel, Risperdal, Trazodone and Invega  - UDS notes Cocaine and Marijuana in addition to amphetamines - ammonia mildly elevated- too lethargic to take Lactulose- no urgency to treat unless it is rising  ? LLL infiltrate vs atelectasis - not hypoxic, no fever and no leukocytosis - has been given Levaquin in the ER- hold off on further antibiotics- do not treat unless symptoms are seen    Diabetes mellitus without complication  - on Glucophage, Januvia and Levemir (dose unknown) at home -glucose is 124 on Bmet -  place on SSI     Bipolar I disorder with depression, severe   - holding all meds for now   DVT prophylaxis: Heparin  Code Status: Full code  Family Communication:   Disposition Plan:   Consults called: none  Admission status: inpatient    Debbe Odea MD [facility] Pager: [email or web address] Password [email or web address] 7PM-7AM, please contact night-coverage   11/27/2017, 1:52 PM

## 2017-11-27 NOTE — ED Notes (Signed)
ED TO INPATIENT HANDOFF REPORT  Name/Age/Gender [name] 61 y.o. male  Code Status    Code Status Orders  (From admission, onward)        Start     Ordered   11/27/17 1403  Full code  Continuous     11/27/17 1403    Code Status History    Date Active Date Inactive Code Status Order ID Comments User Context   06/24/2017 19:55 06/30/2017 18:39 Full Code [id number]  Leandrew Koyanagi, MD ED   10/30/2016 12:57 10/31/2016 17:29 Full Code [id number]  Kary Kos, MD Inpatient   07/01/2016 22:35 07/05/2016 20:48 Full Code [id number]  Kelvin Cellar, MD Inpatient   06/29/2014 18:02 06/30/2014 19:41 Full Code [id number]  Linus Mako, PA-C ED   06/13/2014 12:14 06/14/2014 14:59 Full Code [id number]  Starlyn Skeans, PA-C ED   03/30/2014 12:50 03/31/2014 03:31 Full Code [id number]  Jacqulynn Cadet, MD [facility]      Home/SNF/Other Home  Chief Complaint weakness   Level of Care/Admitting Diagnosis ED Disposition    ED Disposition Condition [place]: [facility] [[id number]]  Level of Care: Med-Surg [16]  Diagnosis: AKI (acute kidney injury) [facility]) [[id number]]  Admitting Physician: Onaway, [place]  Attending Physician: Debbe Odea [[id number]]  Estimated length of stay: past midnight tomorrow  Certification:: I certify this patient will need inpatient services for at least 2 midnights  PT Class (Do Not Modify): Inpatient [101]  PT Acc Code (Do Not Modify): Private [1]       Medical History Past Medical History:  Diagnosis Date  . Anxiety   . Bipolar disorder ([place])   . Chronic back pain   . Depression   . Diabetes mellitus without complication ([place])   . Diabetic neuropathy ([place])   . DVT (deep venous thrombosis) ([place])   . Enlarged prostate   . Headache   . Hepatitis    Hepatitis C - has been treated with Harvoni (cleared in July, 2017)  . History of colon polyps   . Mental disorder    bipolar  . Peripheral vascular disease ([place])   . Pneumonia     hx of-about 43yr ago  . Schizophrenia ([place]     Allergies Allergies  Allergen Reactions  . Penicillins Hives and Other (See Comments)    Has patient had a PCN reaction causing immediate rash, facial/tongue/throat swelling, SOB or lightheadedness with hypotension: No Has patient had a PCN reaction causing severe rash involving mucus membranes or skin necrosis: No Has patient had a PCN reaction that required hospitalization No Has patient had a PCN reaction occurring within the last 10 years: No If all of the above answers are "NO", then may proceed with Cephalosporin use.    IV Location/Drains/Wounds Patient Lines/Drains/Airways Status   Active Line/Drains/Airways    Name:   Placement date:   Placement time:   Site:   Days:   Peripheral IV 06/28/17 Right;Anterior Forearm   06/28/17    1248    Forearm   152   Peripheral IV 11/27/17 Left Hand   11/27/17    0828    Hand   less than 1   Peripheral IV 11/27/17 Left Antecubital   11/27/17    0930    Antecubital   less than 1   Incision (Closed) 06/26/17 Back Other (Comment)   06/26/17    0204     154   Wound / Incision (Open or Dehisced) 07/01/16 Other (  Comment) Leg Right;Left;Posterior;Anterior numerous scabs to elbows and anterior  lower right and left legs    07/01/16    2300    Leg   514          Labs/Imaging Results for orders placed or performed during the hospital encounter of 11/27/17 (from the past 48 hour(s))  Comprehensive metabolic panel     Status: Abnormal   Collection Time: 11/27/17  8:38 AM  Result Value Ref Range   Sodium 136 135 - 145 mmol/L   Potassium 3.8 3.5 - 5.1 mmol/L   Chloride 102 101 - 111 mmol/L   CO2 24 22 - 32 mmol/L   Glucose, Bld 123 (H) 65 - 99 mg/dL   BUN 30 (H) 6 - 20 mg/dL   Creatinine, Ser 2.82 (H) 0.61 - 1.24 mg/dL   Calcium 9.5 8.9 - 10.3 mg/dL   Total Protein 7.7 6.5 - 8.1 g/dL   Albumin 4.0 3.5 - 5.0 g/dL   AST 21 15 - 41 U/L   ALT 14 (L) 17 - 63 U/L   Alkaline Phosphatase 96 38 - 126  U/L   Total Bilirubin 0.7 0.3 - 1.2 mg/dL   GFR calc non Af Amer 23 (L) >60 mL/min   GFR calc Af Amer 26 (L) >60 mL/min    Comment: (NOTE) The eGFR has been calculated using the CKD EPI equation. This calculation has not been validated in all clinical situations. eGFR's persistently <60 mL/min signify possible Chronic Kidney Disease.    Anion gap 10 5 - 15  CBC WITH DIFFERENTIAL     Status: Abnormal   Collection Time: 11/27/17  8:38 AM  Result Value Ref Range   WBC 10.8 (H) 4.0 - 10.5 K/uL   RBC 3.85 (L) 4.22 - 5.81 MIL/uL   Hemoglobin 12.5 (L) 13.0 - 17.0 g/dL   HCT 35.6 (L) 39.0 - 52.0 %   MCV 92.5 78.0 - 100.0 fL   MCH 32.5 26.0 - 34.0 pg   MCHC 35.1 30.0 - 36.0 g/dL   RDW 12.4 11.5 - 15.5 %   Platelets 213 150 - 400 K/uL   Neutrophils Relative % 61 %   Neutro Abs 6.6 1.7 - 7.7 K/uL   Lymphocytes Relative 27 %   Lymphs Abs 2.9 0.7 - 4.0 K/uL   Monocytes Relative 10 %   Monocytes Absolute 1.1 (H) 0.1 - 1.0 K/uL   Eosinophils Relative 2 %   Eosinophils Absolute 0.2 0.0 - 0.7 K/uL   Basophils Relative 0 %   Basophils Absolute 0.0 0.0 - 0.1 K/uL  Troponin I     Status: None   Collection Time: 11/27/17  8:38 AM  Result Value Ref Range   Troponin I <0.03 <0.03 ng/mL  Ethanol     Status: None   Collection Time: 11/27/17  8:39 AM  Result Value Ref Range   Alcohol, Ethyl (B) <10 <10 mg/dL    Comment:        LOWEST DETECTABLE LIMIT FOR SERUM ALCOHOL IS 10 mg/dL FOR MEDICAL PURPOSES ONLY   Blood gas, arterial     Status: Abnormal   Collection Time: 11/27/17  8:53 AM  Result Value Ref Range   FIO2 1.00    Delivery systems OXYGEN MASK    pH, Arterial 7.361 7.350 - 7.450   pCO2 arterial 40.3 32.0 - 48.0 mmHg   pO2, Arterial 409 (H) 83.0 - 108.0 mmHg   Bicarbonate 22.2 20.0 - 28.0 mmol/L   Acid-base deficit 2.4 (  H) 0.0 - 2.0 mmol/L   O2 Saturation 99.5 %   Patient temperature 98.6    Collection site RIGHT RADIAL    Drawn by [id number]    Sample type ARTERIAL DRAW     Allens test (pass/fail) PASS PASS  .Cooxemetry Panel (carboxy, met, total hgb, O2 sat)     Status: Abnormal   Collection Time: 11/27/17  8:58 AM  Result Value Ref Range   Total hemoglobin 12.2 12.0 - 16.0 g/dL   O2 Saturation 99.5 %   Carboxyhemoglobin 6.4 (HH) 0.5 - 1.5 %    Comment: CRITICAL RESULT CALLED TO, READ BACK BY AND VERIFIED WITH: dr Regenia Skeeter by Eben Burow rrt rcp on 11/27/17 at 0901    Methemoglobin 1.3 0.0 - 1.5 %  I-Stat Chem 8, ED     Status: Abnormal   Collection Time: 11/27/17  9:06 AM  Result Value Ref Range   Sodium 138 135 - 145 mmol/L   Potassium 4.1 3.5 - 5.1 mmol/L   Chloride 101 101 - 111 mmol/L   BUN 32 (H) 6 - 20 mg/dL   Creatinine, Ser 2.60 (H) 0.61 - 1.24 mg/dL   Glucose, Bld 124 (H) 65 - 99 mg/dL   Calcium, Ion 1.23 1.15 - 1.40 mmol/L   TCO2 25 22 - 32 mmol/L   Hemoglobin 12.6 (L) 13.0 - 17.0 g/dL   HCT 37.0 (L) 39.0 - 52.0 %  CBG monitoring, ED     Status: Abnormal   Collection Time: 11/27/17  9:42 AM  Result Value Ref Range   Glucose-Capillary 123 (H) 65 - 99 mg/dL  I-Stat CG4 Lactic Acid, ED     Status: None   Collection Time: 11/27/17 10:07 AM  Result Value Ref Range   Lactic Acid, Venous 1.49 0.5 - 1.9 mmol/L  Ammonia     Status: Abnormal   Collection Time: 11/27/17 10:22 AM  Result Value Ref Range   Ammonia 44 (H) 9 - 35 umol/L  Urinalysis, Complete w Microscopic     Status: Abnormal   Collection Time: 11/27/17 10:45 AM  Result Value Ref Range   Color, Urine YELLOW YELLOW   APPearance CLOUDY (A) CLEAR   Specific Gravity, Urine 1.014 1.005 - 1.030   pH 5.0 5.0 - 8.0   Glucose, UA NEGATIVE NEGATIVE mg/dL   Hgb urine dipstick NEGATIVE NEGATIVE   Bilirubin Urine NEGATIVE NEGATIVE   Ketones, ur NEGATIVE NEGATIVE mg/dL   Protein, ur NEGATIVE NEGATIVE mg/dL   Nitrite NEGATIVE NEGATIVE   Leukocytes, UA NEGATIVE NEGATIVE   RBC / HPF 0-5 0 - 5 RBC/hpf   WBC, UA 0-5 0 - 5 WBC/hpf   Bacteria, UA RARE (A) NONE SEEN   Squamous Epithelial /  LPF 0-5 (A) NONE SEEN   Mucus PRESENT    Hyaline Casts, UA PRESENT    Amorphous Crystal PRESENT   Urine rapid drug screen (hosp performed)     Status: Abnormal   Collection Time: 11/27/17 10:45 AM  Result Value Ref Range   Opiates NONE DETECTED NONE DETECTED   Cocaine POSITIVE (A) NONE DETECTED   Benzodiazepines NONE DETECTED NONE DETECTED   Amphetamines POSITIVE (A) NONE DETECTED   Tetrahydrocannabinol POSITIVE (A) NONE DETECTED   Barbiturates NONE DETECTED NONE DETECTED    Comment: (NOTE) DRUG SCREEN FOR MEDICAL PURPOSES ONLY.  IF CONFIRMATION IS NEEDED FOR ANY PURPOSE, NOTIFY LAB WITHIN 5 DAYS. LOWEST DETECTABLE LIMITS FOR URINE DRUG SCREEN Drug Class  Cutoff (ng/mL) Amphetamine and metabolites    1000 Barbiturate and metabolites    200 Benzodiazepine                 170 Tricyclics and metabolites     300 Opiates and metabolites        300 Cocaine and metabolites        300 THC                            50    Ct Head Wo Contrast  Result Date: 11/27/2017 CLINICAL DATA:  Lethargy. EXAM: CT HEAD WITHOUT CONTRAST TECHNIQUE: Contiguous axial images were obtained from the base of the skull through the vertex without intravenous contrast. COMPARISON:  11/18/2017 FINDINGS: Brain: There is no evidence for acute hemorrhage, hydrocephalus, mass lesion, or abnormal extra-axial fluid collection. No definite CT evidence for acute infarction. Right parietal ventriculostomy shunt catheter again noted. Stable appearance of the lateral ventricles. The periventricular hypoattenuation is similar to prior study. Similar appearance of sulcal effacement at the vertex. Vascular: No hyperdense vessel or unexpected calcification. Skull: No evidence for fracture. No worrisome lytic or sclerotic lesion. Sinuses/Orbits: The visualized paranasal sinuses and mastoid air cells are clear. Visualized portions of the globes and intraorbital fat are unremarkable. Other: None. IMPRESSION: 1.  Stable exam.  No acute or progressive interval findings. Electronically Signed   By: Misty Stanley M.D.   On: 11/27/2017 09:36   Dg Chest Port 1 View  Result Date: 11/27/2017 CLINICAL DATA:  Altered mental status. EXAM: PORTABLE CHEST 1 VIEW COMPARISON:  05/24/2017 FINDINGS: Apical lordotic positioning. Lower cervical spine fixation. Midline trachea. Borderline cardiomegaly. Small left pleural effusion is suspected. No pneumothorax. New left base airspace disease. Minimal volume loss at the right lung base. IMPRESSION: New left base airspace disease with probable small layering left pleural effusion. This airspace disease could represent atelectasis or infection/aspiration. Electronically Signed   By: Abigail Miyamoto M.D.   On: 11/27/2017 09:22    Pending Labs Unresulted Labs (From admission, onward)   Start     Ordered   11/28/17 0174  Basic metabolic panel  Tomorrow morning,   R     11/27/17 1403   11/28/17 0500  CBC  Tomorrow morning,   R     11/27/17 1403   11/27/17 1403  TSH  Once,   R     11/27/17 1403   11/27/17 1403  Hemoglobin A1c  Once,   R     11/27/17 1403   11/27/17 1401  HIV antibody (Routine Testing)  Once,   R     11/27/17 1403   11/27/17 1401  CBC  (heparin)  Once,   R    Comments:  Baseline for heparin therapy IF NOT ALREADY DRAWN.  Notify MD if PLT < 100 K.    11/27/17 1403   11/27/17 1401  Creatinine, serum  (heparin)  Once,   R    Comments:  Baseline for heparin therapy IF NOT ALREADY DRAWN.    11/27/17 1403   11/27/17 0947  Culture, blood (routine x 2)  BLOOD CULTURE X 2,   STAT     11/27/17 0949      Vitals/Pain Today's Vitals   11/27/17 1300 11/27/17 1330 11/27/17 1400 11/27/17 1430  BP: 124/74 122/68 124/70 134/70  Pulse: 92 91 93 87  Resp: 12 14 12 12   Temp:      TempSrc:  SpO2: 97% 96% 98% 98%    Isolation Precautions No active isolations  Medications Medications  heparin injection 5,000 Units (not administered)  0.9 %  sodium chloride  infusion (not administered)  ondansetron (ZOFRAN) tablet 4 mg (not administered)    Or  ondansetron (ZOFRAN) injection 4 mg (not administered)  acetaminophen (TYLENOL) tablet 650 mg (not administered)    Or  acetaminophen (TYLENOL) suppository 650 mg (not administered)  insulin aspart (novoLOG) injection 0-9 Units (not administered)  naloxone (NARCAN) injection 1 mg (1 mg Intravenous Given 11/27/17 0845)  sodium chloride 0.9 % bolus 1,000 mL (0 mLs Intravenous Stopped 11/27/17 1406)  levofloxacin (LEVAQUIN) IVPB 750 mg (0 mg Intravenous Stopped 11/27/17 1243)    Mobility walks with person assist

## 2017-11-27 NOTE — Progress Notes (Signed)
Patient arrived on unit via stretcher from ED.  Foley in place.  No family at bedside.  Telemetry placed per MD order and CMT notified.  Patient arousable but will not stay awake long enough to do admission.  Will continue to monitor.

## 2017-11-28 ENCOUNTER — Other Ambulatory Visit: Payer: Self-pay

## 2017-11-28 DIAGNOSIS — R338 Other retention of urine: Secondary | ICD-10-CM

## 2017-11-28 DIAGNOSIS — F314 Bipolar disorder, current episode depressed, severe, without psychotic features: Secondary | ICD-10-CM

## 2017-11-28 DIAGNOSIS — N179 Acute kidney failure, unspecified: Principal | ICD-10-CM

## 2017-11-28 DIAGNOSIS — E119 Type 2 diabetes mellitus without complications: Secondary | ICD-10-CM

## 2017-11-28 DIAGNOSIS — G9340 Encephalopathy, unspecified: Secondary | ICD-10-CM

## 2017-11-28 LAB — HIV ANTIBODY (ROUTINE TESTING W REFLEX): HIV Screen 4th Generation wRfx: NONREACTIVE

## 2017-11-28 LAB — GLUCOSE, CAPILLARY
Glucose-Capillary: 114 mg/dL — ABNORMAL HIGH (ref 65–99)
Glucose-Capillary: 126 mg/dL — ABNORMAL HIGH (ref 65–99)
Glucose-Capillary: 129 mg/dL — ABNORMAL HIGH (ref 65–99)
Glucose-Capillary: 130 mg/dL — ABNORMAL HIGH (ref 65–99)
Glucose-Capillary: 138 mg/dL — ABNORMAL HIGH (ref 65–99)
Glucose-Capillary: 87 mg/dL (ref 65–99)
Glucose-Capillary: 94 mg/dL (ref 65–99)

## 2017-11-28 LAB — CBC
HCT: 35.8 % — ABNORMAL LOW (ref 39.0–52.0)
Hemoglobin: 12.4 g/dL — ABNORMAL LOW (ref 13.0–17.0)
MCH: 32 pg (ref 26.0–34.0)
MCHC: 34.6 g/dL (ref 30.0–36.0)
MCV: 92.3 fL (ref 78.0–100.0)
PLATELETS: 212 10*3/uL (ref 150–400)
RBC: 3.88 MIL/uL — AB (ref 4.22–5.81)
RDW: 12.1 % (ref 11.5–15.5)
WBC: 8.3 10*3/uL (ref 4.0–10.5)

## 2017-11-28 LAB — BASIC METABOLIC PANEL
Anion gap: 8 (ref 5–15)
BUN: 23 mg/dL — ABNORMAL HIGH (ref 6–20)
CALCIUM: 9.1 mg/dL (ref 8.9–10.3)
CHLORIDE: 107 mmol/L (ref 101–111)
CO2: 23 mmol/L (ref 22–32)
CREATININE: 1.34 mg/dL — AB (ref 0.61–1.24)
GFR, EST NON AFRICAN AMERICAN: 56 mL/min — AB (ref >60)
Glucose, Bld: 131 mg/dL — ABNORMAL HIGH (ref 65–99)
Potassium: 4.6 mmol/L (ref 3.5–5.1)
SODIUM: 138 mmol/L (ref 135–145)

## 2017-11-28 MED ORDER — INFLUENZA VAC SPLIT QUAD 0.5 ML IM SUSY
0.5000 mL | PREFILLED_SYRINGE | INTRAMUSCULAR | Status: AC
  Administered 2017-11-29: 0.5 mL via INTRAMUSCULAR
  Filled 2017-11-28: qty 0.5

## 2017-11-28 MED ORDER — INSULIN ASPART 100 UNIT/ML ~~LOC~~ SOLN
0.0000 [IU] | Freq: Three times a day (TID) | SUBCUTANEOUS | Status: DC
  Administered 2017-11-28: 1 [IU] via SUBCUTANEOUS
  Administered 2017-11-29: 2 [IU] via SUBCUTANEOUS
  Administered 2017-11-29: 1 [IU] via SUBCUTANEOUS

## 2017-11-28 NOTE — Progress Notes (Signed)
Foley catheter removed per MD order.  Patient tolerated well.  

## 2017-11-28 NOTE — Progress Notes (Signed)
PROGRESS NOTE  [name]  [name]:[id number] DOB: 11-23-1956 DOA: 11/27/2017 PCP: Richard Ebbs, MD   Brief Narrative: [name] is a 61 y.o. male with a history of bipolar disorder, substance abuse who was brought to the ED by friends due to lethargy and confusion found to have low blood pressure and acute kidney injury. History was limited, though suspicion was for AKI leading to poor clearance of sedating medications. He was unable to void, so foley was placed w/>500cc out, and UDS also positive for THC, cocaine, amphetamines. He was admitted, given IV fluids and shown improvement in renal function and mental status.   Assessment & Plan: Principal Problem:   AKI (acute kidney injury) ([name]) Active Problems:   Bipolar I disorder with depression, severe ([name])   Acute encephalopathy   Diabetes mellitus without complication ([name])   Acute urinary retention  Acute toxic metabolic encephalopathy: Due to polysubstance use/withdrawal and poor per oral intake with subsequent hemodynamically mediated AKI which caused impaired clearance of sedating medications.  - Improving, near baseline, but still seems slightly lethargic.  - Doubt ammonia elevation is clinically significant, but would consider lactulose if not cleared with continued improvement in renal function and holding sedating medications.  - DC neuro checks and telemetry.   Acute renal failure: Likely prerenal based on limited oral intake and also post renal based on urinary retention (suspected due to psychotropic medications) - Continue IVF's for now - Hold nephrotoxic medications - Renal U/S if not continuing improvement in AM - Monitor renal function, UOP   T2DM: Well-controlled w/HbA1c 5.1% - Carb-modified diet ok now that he's mentating - SSI  Bipolar 1 disorder with depression: No current mania.  - Hold medications today until mental status at baseline.   LLL atelectasis: Favored over infiltrate as no other symptoms  of pneumonia. Given levaquin x1 in ED - If symptoms develop, would treat including anaerobic coverage given his former lethargy.   Polysubstance use:  - Counseled to discontinue use of crack cocaine, alerted him that amphetamines were also in his system in addition to [name].  - CSW consulted.   DVT prophylaxis: Heparin Code Status: Full Family Communication: None at bedside, lives alone Disposition Plan: DC home in AM if mentation improving, creatinine improving.   Consultants:   None  Procedures:   None  Antimicrobials:  Levaquin x1 12/29   Subjective: Feeling "fine" but very hungry. Not sure why he's in the Owen, thinks it's January (2 days left in December). No N/V or other complaints. Says he has gotten sleepy to the point of being poorly rousable after taking trazodone in the past.   Objective: Vitals:   11/27/17 1506 11/27/17 1514 11/27/17 2119 11/28/17 0406  BP:  (!) 158/93 132/63 130/69  Pulse:  98 73 84  Resp:  15 15 16   Temp:  97.7 F (36.5 C) 98.4 F (36.9 C) 98.3 F (36.8 C)  TempSrc:  Axillary Oral Oral  SpO2:  100% 100% 99%  Weight: 84.1 kg (185 lb 6.5 oz)     Height: 6\' 1"  (1.854 m)       Intake/Output Summary (Last 24 hours) at 11/28/2017 1329 Last data filed at 11/28/2017 1137 Gross per 24 hour  Intake 3000 ml  Output 5275 ml  Net -2275 ml   Filed Weights   11/27/17 1506  Weight: 84.1 kg (185 lb 6.5 oz)    Gen: 61 y.o. male in no distress  Pulm: Non-labored breathing room air. Clear to auscultation bilaterally.  CV: Regular rate and rhythm. No murmur, rub, or gallop. No JVD, no pedal edema. GI: Abdomen soft, non-tender, non-distended, with normoactive bowel sounds. No organomegaly or masses felt. Ext: Warm, no deformities Skin: No rashes, lesions no ulcers Neuro: Alert and oriented. No focal neurological deficits. Psych: Judgement and insight appear normal. Mood & affect appropriate.   Data Reviewed: I have personally reviewed  following labs and imaging studies  CBC: Recent Labs  Lab 11/27/17 0838 11/27/17 0906 11/27/17 1401 11/28/17 0556  WBC 10.8*  --  10.4 8.3  NEUTROABS 6.6  --   --   --   HGB 12.5* 12.6* 12.2* 12.4*  HCT 35.6* 37.0* 36.0* 35.8*  MCV 92.5  --  93.3 92.3  PLT 213  --  212 950   Basic Metabolic Panel: Recent Labs  Lab 11/27/17 0838 11/27/17 0906 11/27/17 1401 11/28/17 0556  NA 136 138  --  138  K 3.8 4.1  --  4.6  CL 102 101  --  107  CO2 24  --   --  23  GLUCOSE 123* 124*  --  131*  BUN 30* 32*  --  23*  CREATININE 2.82* 2.60* 2.52* 1.34*  CALCIUM 9.5  --   --  9.1   GFR: Estimated Creatinine Clearance: 66.3 mL/min (A) (by C-G formula based on SCr of 1.34 mg/dL (H)). Liver Function Tests: Recent Labs  Lab 11/27/17 0838  AST 21  ALT 14*  ALKPHOS 96  BILITOT 0.7  PROT 7.7  ALBUMIN 4.0   No results for input(s): LIPASE, AMYLASE in the last 168 hours. Recent Labs  Lab 11/27/17 1022  AMMONIA 44*   Coagulation Profile: No results for input(s): INR, PROTIME in the last 168 hours. Cardiac Enzymes: Recent Labs  Lab 11/27/17 0838  TROPONINI <0.03   BNP (last 3 results) No results for input(s): PROBNP in the last 8760 hours. HbA1C: Recent Labs    11/27/17 1403  HGBA1C 5.1   CBG: Recent Labs  Lab 11/27/17 2117 11/28/17 0016 11/28/17 0349 11/28/17 0748 11/28/17 1123  GLUCAP 129* 87 114* 130* 94   Lipid Profile: No results for input(s): CHOL, HDL, LDLCALC, TRIG, CHOLHDL, LDLDIRECT in the last 72 hours. Thyroid Function Tests: Recent Labs    11/27/17 1403  TSH 0.662   Anemia Panel: No results for input(s): VITAMINB12, FOLATE, FERRITIN, TIBC, IRON, RETICCTPCT in the last 72 hours. Urine analysis:    Component Value Date/Time   COLORURINE YELLOW 11/27/2017 1045   APPEARANCEUR CLOUDY (A) 11/27/2017 1045   LABSPEC 1.014 11/27/2017 1045   PHURINE 5.0 11/27/2017 1045   GLUCOSEU NEGATIVE 11/27/2017 1045   HGBUR NEGATIVE 11/27/2017 [place] 11/27/2017 [place] 11/27/2017 1045   PROTEINUR NEGATIVE 11/27/2017 1045   UROBILINOGEN 0.2 08/27/2017 1538   NITRITE NEGATIVE 11/27/2017 1045   LEUKOCYTESUR NEGATIVE 11/27/2017 1045   No results found for this or any previous visit (from the past 240 hour(s)).    Radiology Studies: Ct Head Wo Contrast  Result Date: 11/27/2017 CLINICAL DATA:  Lethargy. EXAM: CT HEAD WITHOUT CONTRAST TECHNIQUE: Contiguous axial images were obtained from the base of the skull through the vertex without intravenous contrast. COMPARISON:  11/18/2017 FINDINGS: Brain: There is no evidence for acute hemorrhage, hydrocephalus, mass lesion, or abnormal extra-axial fluid collection. No definite CT evidence for acute infarction. Right parietal ventriculostomy shunt catheter again noted. Stable appearance of the lateral ventricles. The periventricular hypoattenuation is similar to prior study. Similar appearance  of sulcal effacement at the vertex. Vascular: No hyperdense vessel or unexpected calcification. Skull: No evidence for fracture. No worrisome lytic or sclerotic lesion. Sinuses/Orbits: The visualized paranasal sinuses and mastoid air cells are clear. Visualized portions of the globes and intraorbital fat are unremarkable. Other: None. IMPRESSION: 1. Stable exam.  No acute or progressive interval findings. Electronically Signed   By: Misty Stanley M.D.   On: 11/27/2017 09:36   Dg Chest Port 1 View  Result Date: 11/27/2017 CLINICAL DATA:  Altered mental status. EXAM: PORTABLE CHEST 1 VIEW COMPARISON:  05/24/2017 FINDINGS: Apical lordotic positioning. Lower cervical spine fixation. Midline trachea. Borderline cardiomegaly. Small left pleural effusion is suspected. No pneumothorax. New left base airspace disease. Minimal volume loss at the right lung base. IMPRESSION: New left base airspace disease with probable small layering left pleural effusion. This airspace disease could  represent atelectasis or infection/aspiration. Electronically Signed   By: Abigail Miyamoto M.D.   On: 11/27/2017 09:22    Scheduled Meds: . heparin  5,000 Units Subcutaneous Q8H  . [START ON 11/29/2017] Influenza vac split quadrivalent PF  0.5 mL Intramuscular Tomorrow-1000  . insulin aspart  0-9 Units Subcutaneous TID WC   Continuous Infusions: . sodium chloride 125 mL/hr at 11/28/17 0736     LOS: 1 day   Time spent: 25 minutes.  Vance Gather, MD [facility] Pager [phone]  If 7PM-7AM, please contact night-coverage [email or web address] Password [email or web address] 11/28/2017, 1:29 PM

## 2017-11-28 NOTE — Discharge Summary (Deleted)
PROGRESS NOTE  [name]  BJS:[id number] DOB: May 27, 1956 DOA: 11/27/2017 PCP: Nolene Ebbs, MD   Brief Narrative: [name] is a 61 y.o. male with a history of bipolar disorder, substance abuse who was brought to the ED by friends due to lethargy and confusion found to have low blood pressure and acute kidney injury. History was limited, though suspicion was for AKI leading to poor clearance of sedating medications. He was unable to void, so foley was placed w/>500cc out, and UDS also positive for THC, cocaine, amphetamines. He was admitted, given IV fluids and shown improvement in renal function and mental status.   Assessment & Plan: Principal Problem:   AKI (acute kidney injury) ([place]) Active Problems:   Bipolar I disorder with depression, severe ([place])   Acute encephalopathy   Diabetes mellitus without complication ([place])   Acute urinary retention  Acute toxic metabolic encephalopathy: Due to polysubstance use/withdrawal and poor per oral intake with subsequent hemodynamically mediated AKI which caused impaired clearance of sedating medications.  - Improving, near baseline, but still seems slightly lethargic.  - Doubt ammonia elevation is clinically significant, but would consider lactulose if not cleared with continued improvement in renal function and holding sedating medications.  - DC neuro checks and telemetry.   Acute renal failure: Likely prerenal based on limited oral intake and also post renal based on urinary retention (suspected due to psychotropic medications) - Continue IVF's for now - Hold nephrotoxic medications - Renal U/S if not continuing improvement in AM - Monitor renal function, UOP   T2DM: Well-controlled w/HbA1c 5.1% - Carb-modified diet ok now that he's mentating - SSI  Bipolar 1 disorder with depression: No current mania.  - Hold medications today until mental status at baseline.   LLL atelectasis: Favored over infiltrate as no other symptoms  of pneumonia. Given levaquin x1 in ED - If symptoms develop, would treat including anaerobic coverage given his former lethargy.   Polysubstance use:  - Counseled to discontinue use of crack cocaine, alerted him that amphetamines were also in his system in addition to [facility].  - CSW consulted.   DVT prophylaxis: Heparin Code Status: Full Family Communication: None at bedside, lives alone Disposition Plan: DC home in AM if mentation improving, creatinine improving.   Consultants:   None  Procedures:   None  Antimicrobials:  Levaquin x1 12/29   Subjective: Feeling "fine" but very hungry. Not sure why he's in the hospital, thinks it's January (2 days left in December). No N/V or other complaints. Says he has gotten sleepy to the point of being poorly rousable after taking trazodone in the past.   Objective: Vitals:   11/27/17 1506 11/27/17 1514 11/27/17 2119 11/28/17 0406  BP:  (!) 158/93 132/63 130/69  Pulse:  98 73 84  Resp:  15 15 16   Temp:  97.7 F (36.5 C) 98.4 F (36.9 C) 98.3 F (36.8 C)  TempSrc:  Axillary Oral Oral  SpO2:  100% 100% 99%  Weight: 84.1 kg (185 lb 6.5 oz)     Height: 6\' 1"  (1.854 m)       Intake/Output Summary (Last 24 hours) at 11/28/2017 1113 Last data filed at 11/28/2017 0740 Gross per 24 hour  Intake 2400 ml  Output 4325 ml  Net -1925 ml   Filed Weights   11/27/17 1506  Weight: 84.1 kg (185 lb 6.5 oz)    Gen: 61 y.o. male in no distress  Pulm: Non-labored breathing room air. Clear to auscultation bilaterally.  CV: Regular rate and rhythm. No murmur, rub, or gallop. No JVD, no pedal edema. GI: Abdomen soft, non-tender, non-distended, with normoactive bowel sounds. No organomegaly or masses felt. Ext: Warm, no deformities Skin: No rashes, lesions no ulcers Neuro: Alert and oriented. No focal neurological deficits. Psych: Judgement and insight appear normal. Mood & affect appropriate.   Data Reviewed: I have personally reviewed  following labs and imaging studies  CBC: Recent Labs  Lab 11/27/17 0838 11/27/17 0906 11/27/17 1401 11/28/17 0556  WBC 10.8*  --  10.4 8.3  NEUTROABS 6.6  --   --   --   HGB 12.5* 12.6* 12.2* 12.4*  HCT 35.6* 37.0* 36.0* 35.8*  MCV 92.5  --  93.3 92.3  PLT 213  --  212 209   Basic Metabolic Panel: Recent Labs  Lab 11/27/17 0838 11/27/17 0906 11/27/17 1401 11/28/17 0556  NA 136 138  --  138  K 3.8 4.1  --  4.6  CL 102 101  --  107  CO2 24  --   --  23  GLUCOSE 123* 124*  --  131*  BUN 30* 32*  --  23*  CREATININE 2.82* 2.60* 2.52* 1.34*  CALCIUM 9.5  --   --  9.1   GFR: Estimated Creatinine Clearance: 66.3 mL/min (A) (by C-G formula based on SCr of 1.34 mg/dL (H)). Liver Function Tests: Recent Labs  Lab 11/27/17 0838  AST 21  ALT 14*  ALKPHOS 96  BILITOT 0.7  PROT 7.7  ALBUMIN 4.0   No results for input(s): LIPASE, AMYLASE in the last 168 hours. Recent Labs  Lab 11/27/17 1022  AMMONIA 44*   Coagulation Profile: No results for input(s): INR, PROTIME in the last 168 hours. Cardiac Enzymes: Recent Labs  Lab 11/27/17 0838  TROPONINI <0.03   BNP (last 3 results) No results for input(s): PROBNP in the last 8760 hours. HbA1C: Recent Labs    11/27/17 1403  HGBA1C 5.1   CBG: Recent Labs  Lab 11/27/17 1605 11/27/17 2117 11/28/17 0016 11/28/17 0349 11/28/17 0748  GLUCAP 105* 129* 87 114* 130*   Lipid Profile: No results for input(s): CHOL, HDL, LDLCALC, TRIG, CHOLHDL, LDLDIRECT in the last 72 hours. Thyroid Function Tests: Recent Labs    11/27/17 1403  TSH 0.662   Anemia Panel: No results for input(s): VITAMINB12, FOLATE, FERRITIN, TIBC, IRON, RETICCTPCT in the last 72 hours. Urine analysis:    Component Value Date/Time   COLORURINE YELLOW 11/27/2017 1045   APPEARANCEUR CLOUDY (A) 11/27/2017 1045   LABSPEC 1.014 11/27/2017 1045   PHURINE 5.0 11/27/2017 1045   GLUCOSEU NEGATIVE 11/27/2017 1045   HGBUR NEGATIVE 11/27/2017 [place] 11/27/2017 [place] 11/27/2017 1045   PROTEINUR NEGATIVE 11/27/2017 1045   UROBILINOGEN 0.2 08/27/2017 1538   NITRITE NEGATIVE 11/27/2017 1045   LEUKOCYTESUR NEGATIVE 11/27/2017 1045   No results found for this or any previous visit (from the past 240 hour(s)).    Radiology Studies: Ct Head Wo Contrast  Result Date: 11/27/2017 CLINICAL DATA:  Lethargy. EXAM: CT HEAD WITHOUT CONTRAST TECHNIQUE: Contiguous axial images were obtained from the base of the skull through the vertex without intravenous contrast. COMPARISON:  11/18/2017 FINDINGS: Brain: There is no evidence for acute hemorrhage, hydrocephalus, mass lesion, or abnormal extra-axial fluid collection. No definite CT evidence for acute infarction. Right parietal ventriculostomy shunt catheter again noted. Stable appearance of the lateral ventricles. The periventricular hypoattenuation is similar to prior study. Similar appearance  of sulcal effacement at the vertex. Vascular: No hyperdense vessel or unexpected calcification. Skull: No evidence for fracture. No worrisome lytic or sclerotic lesion. Sinuses/Orbits: The visualized paranasal sinuses and mastoid air cells are clear. Visualized portions of the globes and intraorbital fat are unremarkable. Other: None. IMPRESSION: 1. Stable exam.  No acute or progressive interval findings. Electronically Signed   By: Misty Stanley M.D.   On: 11/27/2017 09:36   Dg Chest Port 1 View  Result Date: 11/27/2017 CLINICAL DATA:  Altered mental status. EXAM: PORTABLE CHEST 1 VIEW COMPARISON:  05/24/2017 FINDINGS: Apical lordotic positioning. Lower cervical spine fixation. Midline trachea. Borderline cardiomegaly. Small left pleural effusion is suspected. No pneumothorax. New left base airspace disease. Minimal volume loss at the right lung base. IMPRESSION: New left base airspace disease with probable small layering left pleural effusion. This airspace disease could  represent atelectasis or infection/aspiration. Electronically Signed   By: Abigail Miyamoto M.D.   On: 11/27/2017 09:22    Scheduled Meds: . heparin  5,000 Units Subcutaneous Q8H  . [START ON 11/29/2017] Influenza vac split quadrivalent PF  0.5 mL Intramuscular Tomorrow-1000  . insulin aspart  0-9 Units Subcutaneous Q4H   Continuous Infusions: . sodium chloride 125 mL/hr at 11/28/17 0736     LOS: 1 day   Time spent: 25 minutes.  Vance Gather, MD [facility] Pager [phone]  If 7PM-7AM, please contact night-coverage [email or web address] Password [email or web address] 11/28/2017, 11:13 AM

## 2017-11-29 LAB — BASIC METABOLIC PANEL
ANION GAP: 6 (ref 5–15)
BUN: 14 mg/dL (ref 6–20)
CALCIUM: 9.4 mg/dL (ref 8.9–10.3)
CO2: 25 mmol/L (ref 22–32)
CREATININE: 0.76 mg/dL (ref 0.61–1.24)
Chloride: 110 mmol/L (ref 101–111)
Glucose, Bld: 126 mg/dL — ABNORMAL HIGH (ref 65–99)
Potassium: 4.3 mmol/L (ref 3.5–5.1)
SODIUM: 141 mmol/L (ref 135–145)

## 2017-11-29 LAB — GLUCOSE, CAPILLARY
GLUCOSE-CAPILLARY: 142 mg/dL — AB (ref 65–99)
GLUCOSE-CAPILLARY: 182 mg/dL — AB (ref 65–99)

## 2017-11-29 MED ORDER — TRAZODONE HCL 150 MG PO TABS: 75.0000 mg | ORAL_TABLET | Freq: Every day | ORAL | 0 refills | Status: AC

## 2017-11-29 NOTE — Progress Notes (Signed)
Date: November 29, 2017 Chart review for discharge needs:  None found for case management. Patient has no questions concerning post hospital care.

## 2017-11-29 NOTE — Clinical Social Work Note (Signed)
Clinical Social Work Assessment  Patient Details  Name: [name] MRN: [id number] Date of Birth: 10-Jan-1956  Date of referral:  11/29/17               Reason for consult:  Substance Use/ETOH Abuse                Permission sought to share information with:  Facility Art therapist granted to share information::     Name::        Agency::  [facility]   Relationship::     Contact Information:     Housing/Transportation Living arrangements for the past 2 months:  Apartment Source of Information:  Patient Patient Interpreter Needed:  None Criminal Activity/Legal Involvement Pertinent to Current Situation/Hospitalization:  No - Comment as needed Significant Relationships:  (Unknown) Lives with:  Self Do you feel safe going back to the place where you live?  Yes Need for family participation in patient care:  No (Coment)  Care giving concerns:  Patient reported none.    Social Worker assessment / plan:  CSW spoke with patient at bedside regarding consult for substance abuse. Patient reported that his drug use is "not bad". CSW inquired about how patient's drug use impacted his hospitlization, patient denied any connection. Patient reported that he only needs a way home. Patient reported that he is active with his [facility] and requested that CSW contact [facility] to request transportation for patient. CSW agreed to contact [place] and provided patient with outpatient substance abuse treatment resources.   CSW contacted [facility] and spoke with staff member R[place]. CSW informed staff that patient is requesting that his ACT Team pick him up and transport him home. Staff reported that patient is scheduled to be seen by his ACT Team and that his ACT Team can transport him home. CSW provided staff with patient's contact information to contact and coordinate transportation.  Patient provided with substance abuse treatment resources, CSW signing off, no other needs  identified.   Employment status:  Disabled (Comment on whether or not currently receiving Disability) Insurance information:  Medicare, Medicaid In [place] PT Recommendations:  Not assessed at this time Information / Referral to community resources:  Outpatient Substance Abuse Treatment Options  Patient/Family's Response to care:  Patient accepted substance abuse treatment resources. Patient thanked CSW for assistance with transportation.   Patient/Family's Understanding of and Emotional Response to Diagnosis, Current Treatment, and Prognosis:  Patient presented guarded and verbalized little understanding of diagnosis. Patient in denial about drug use impacting hospitalization. CSW gently confronted patient about drug use impacting hospitalization, patient unable to make connection. CSW encouraged patient to follow up with outpatient substance abuse treatment resources and reminded patient about the importance of his health.   Emotional Assessment Appearance:  Appears stated age Attitude/Demeanor/Rapport:  Guarded Affect (typically observed):  In denial Orientation:  Oriented to Self, Oriented to Place, Oriented to  Time, Oriented to Situation Alcohol / Substance use:  Illicit Drugs Psych involvement (Current and /or in the community):  No (Comment)  Discharge Needs  Concerns to be addressed:  No discharge needs identified Readmission within the last 30 days:  No Current discharge risk:  Substance Abuse Barriers to Discharge:  No Barriers Identified   Burnis Medin, LCSW 11/29/2017, 11:17 AM

## 2017-11-29 NOTE — Care Management Note (Signed)
Case Management Note  Patient Details  Name: [name] MRN: [id number] Date of Birth: Mar 31, 1956  Subjective/Objective:                  Ams/susbstance abuse  Action/Plan: Date: November 29, 2017 Velva Harman, BSN, [place], [place] Chart and notes review for patient progress and needs. Will follow for case management and discharge needs. Next review date: [id number] Expected Discharge Date:                  Expected Discharge Plan:  Home/Self Care  In-House Referral:     Discharge planning Services  CM Consult  Post Acute Care Choice:    Choice offered to:     DME Arranged:    DME Agency:     HH Arranged:    HH Agency:     Status of Service:  In process, will continue to follow  If discussed at Long Length of Stay Meetings, dates discussed:    Additional Comments:  Leeroy Cha, RN 11/29/2017, 8:59 AM

## 2017-11-29 NOTE — Discharge Summary (Signed)
Physician Discharge Summary  [name]:[id number] DOB: [birth year] DOA: 11/27/2017  PCP: Richard Ebbs, MD  Admit date: 11/27/2017 Discharge date: 11/29/2017  Admitted From: Home Disposition: Home   Recommendations for Outpatient Follow-up:  1. Follow up with PCP in 1-2 weeks with recheck BMP 2. Continue substance abuse cessation efforts  Home Health: None Equipment/Devices: None Discharge Condition: Stable CODE STATUS: Full Diet recommendation: Carb-modified  Brief/Interim Summary: [name] is a 61 y.o. male with a history of bipolar disorder, substance abuse who was brought to the ED by friends due to lethargy and confusion found to have low blood pressure and acute kidney injury. History was limited, though suspicion was for AKI leading to poor clearance of sedating medications. He was unable to void, so foley was placed w/>500cc out, and UDS also positive for THC, cocaine, amphetamines. He was admitted, given IV fluids and shown improvement in renal function and mental status.   Discharge Diagnoses:  Principal Problem:   AKI (acute kidney injury) ([place]) Active Problems:   Bipolar I disorder with depression, severe ([place])   Acute encephalopathy   Diabetes mellitus without complication ([place])   Acute urinary retention  Acute toxic metabolic encephalopathy: Due to polysubstance use/withdrawal and poor per oral intake with subsequent hemodynamically mediated AKI which caused impaired clearance of sedating medications. Doubt ammonia elevation is clinically significant as encephalopathy resolved without treatment. - Resolved.  Acute renal failure: Resolved. Likely prerenal based on limited oral intake and also post renal based on urinary retention (suspected due to psychotropic medications) - No longer requires IVF's. Renal function normal. Suggest recheck at follow up.   T2DM: Well-controlled w/HbA1c 5.1% - Carb-modified diet and restart home meds  Bipolar 1  disorder with depression and insomnia: No current mania.  - Restart meds at discharge, though will recommend decreasing trazodone dose to 75mg .   LLL atelectasis: Favored over infiltrate as no other symptoms of pneumonia. Given levaquin x1 in ED - If symptoms develop, would treat including anaerobic coverage given his former lethargy.    Polysubstance use:  - Counseled to discontinue use of crack cocaine, alerted him that amphetamines were also in his system in addition to [facility].  - CSW consulted.   Discharge Instructions Discharge Instructions    Diet Carb Modified   Complete by:  As directed    Discharge instructions   Complete by:  As directed    Only take 1/2 tablet of trazodone at night Continue eating and drinking. If you are unable to do so, seek medical attention right away.  Continue other medications as you were Stop using all drugs   Increase activity slowly   Complete by:  As directed      Allergies as of 11/29/2017      Reactions   Penicillins Hives, Other (See Comments)   Has patient had a PCN reaction causing immediate rash, facial/tongue/throat swelling, SOB or lightheadedness with hypotension: No Has patient had a PCN reaction causing severe rash involving mucus membranes or skin necrosis: No Has patient had a PCN reaction that required hospitalization No Has patient had a PCN reaction occurring within the last 10 years: No If all of the above answers are "NO", then may proceed with Cephalosporin use.      Medication List    STOP taking these medications   diazepam 2 MG tablet Commonly known as:  VALIUM   docusate sodium 100 MG capsule Commonly known as:  COLACE   HYDROcodone-acetaminophen 5-325 MG tablet Commonly known as:  NORCO/VICODIN   naproxen 500 MG tablet Commonly known as:  NAPROSYN   oxyCODONE-acetaminophen 5-325 MG tablet Commonly known as:  PERCOCET/ROXICET   QUEtiapine 400 MG tablet Commonly known as:  SEROQUEL   risperiDONE 1 MG  tablet Commonly known as:  RISPERDAL     TAKE these medications   celecoxib 200 MG capsule Commonly known as:  CELEBREX Take 1 capsule (200 mg total) by mouth daily.   cholecalciferol 1000 units tablet Commonly known as:  VITAMIN D Take 1,000 Units by mouth daily.   cyclobenzaprine 10 MG tablet Commonly known as:  FLEXERIL TAKE 1 TABLET BY MOUTH 3 TIMES A DAY AS NEEDED FOR MUSCLE SPASMS   EASY TOUCH PEN NEEDLES 29G X 12MM Misc Generic drug:  Insulin Pen Needle   ferrous gluconate 324 MG tablet Commonly known as:  FERGON Take 1 tablet (324 mg total) by mouth 2 (two) times daily with a meal.   gabapentin 300 MG capsule Commonly known as:  NEURONTIN Take 1 capsule (300 mg total) by mouth at bedtime.   ibuprofen 600 MG tablet Commonly known as:  ADVIL,MOTRIN   metFORMIN 500 MG tablet Commonly known as:  GLUCOPHAGE Take 500 mg by mouth daily with breakfast.   paliperidone 3 MG 24 hr tablet Commonly known as:  INVEGA Take 1 tablet (3 mg total) by mouth at bedtime.   sertraline 100 MG tablet Commonly known as:  ZOLOFT Take 100 mg by mouth at bedtime.   sitaGLIPtin 100 MG tablet Commonly known as:  JANUVIA Take 100 mg by mouth daily with breakfast.   tamsulosin 0.4 MG Caps capsule Commonly known as:  FLOMAX Take 0.4 mg by mouth daily after supper.   traZODone 150 MG tablet Commonly known as:  DESYREL Take 0.5 tablets (75 mg total) by mouth at bedtime. What changed:  how much to take      Follow-up Information    Richard Ebbs, MD Follow up.   Specialty:  Internal Medicine Contact information: [place] [address] [phone]          Allergies  Allergen Reactions  . Penicillins Hives and Other (See Comments)    Has patient had a PCN reaction causing immediate rash, facial/tongue/throat swelling, SOB or lightheadedness with hypotension: No Has patient had a PCN reaction causing severe rash involving mucus membranes or skin  necrosis: No Has patient had a PCN reaction that required hospitalization No Has patient had a PCN reaction occurring within the last 10 years: No If all of the above answers are "NO", then may proceed with Cephalosporin use.    Consultations:  None  Procedures/Studies: Ct Head Wo Contrast  Result Date: 11/27/2017 CLINICAL DATA:  Lethargy. EXAM: CT HEAD WITHOUT CONTRAST TECHNIQUE: Contiguous axial images were obtained from the base of the skull through the vertex without intravenous contrast. COMPARISON:  11/18/2017 FINDINGS: Brain: There is no evidence for acute hemorrhage, hydrocephalus, mass lesion, or abnormal extra-axial fluid collection. No definite CT evidence for acute infarction. Right parietal ventriculostomy shunt catheter again noted. Stable appearance of the lateral ventricles. The periventricular hypoattenuation is similar to prior study. Similar appearance of sulcal effacement at the vertex. Vascular: No hyperdense vessel or unexpected calcification. Skull: No evidence for fracture. No worrisome lytic or sclerotic lesion. Sinuses/Orbits: The visualized paranasal sinuses and mastoid air cells are clear. Visualized portions of the globes and intraorbital fat are unremarkable. Other: None. IMPRESSION: 1. Stable exam.  No acute or progressive interval findings. Electronically Signed   By: Randall Hiss  Tery Sanfilippo M.D.   On: 11/27/2017 09:36   Ct Head Wo Contrast  Result Date: 11/18/2017 CLINICAL DATA:  History of shunt. Unsteady gait and leg weakness with falling for 1 month. EXAM: CT HEAD WITHOUT CONTRAST TECHNIQUE: Contiguous axial images were obtained from the base of the skull through the vertex without intravenous contrast. COMPARISON:  05/24/2017 FINDINGS: Brain: There is a right parietal approach ventriculoperitoneal shunt catheter. No visible catheter discontinuity. No demonstrable change in ventricular volume by measurement. There is some sulcal crowding at the vertex, bilateral  parasagittal, which appears new from prior. No extra-axial collection, infarct, or hemorrhage. Stable periventricular low-density that is likely chronic small vessel ischemia. Vascular: Atherosclerotic calcification.  No hyperdense vessel. Skull: No acute finding. Sinuses/Orbits: Negative IMPRESSION: 1. Right parietal shunt with unchanged ventricular volume. 2. Sulcal effacement at the vertex and interhemispheric fissure not seen 05/24/2017. Consider follow-up to ensure this is not an early sign of altered CSF flow. 3. No extra-axial collection. Electronically Signed   By: Monte Fantasia M.D.   On: 11/18/2017 14:09   Dg Chest Port 1 View  Result Date: 11/27/2017 CLINICAL DATA:  Altered mental status. EXAM: PORTABLE CHEST 1 VIEW COMPARISON:  05/24/2017 FINDINGS: Apical lordotic positioning. Lower cervical spine fixation. Midline trachea. Borderline cardiomegaly. Small left pleural effusion is suspected. No pneumothorax. New left base airspace disease. Minimal volume loss at the right lung base. IMPRESSION: New left base airspace disease with probable small layering left pleural effusion. This airspace disease could represent atelectasis or infection/aspiration. Electronically Signed   By: Abigail Miyamoto M.D.   On: 11/27/2017 09:22   Xr C-arm No Report  Result Date: 11/17/2017 Please see Notes or Procedures tab for imaging impression.    Subjective: Feels well, mentating at baseline. No complaints, ready to go home.   Discharge Exam: Vitals:   11/28/17 2110 11/29/17 0623  BP: 121/61 (!) 158/69  Pulse: 86 82  Resp: 16 16  Temp: 98.7 F (37.1 C)   SpO2: 99% 98%   General: Pt is alert, awake, not in acute distress Cardiovascular: RRR, S1/S2 +, no rubs, no gallops Respiratory: CTA bilaterally, no wheezing, no rhonchi Abdominal: Soft, NT, ND, bowel sounds + Extremities: No edema, no cyanosis Neuro: Alert, oriented, no deficits.   Labs: BNP (last 3 results) No results for input(s): BNP in  the last 8760 hours. Basic Metabolic Panel: Recent Labs  Lab 11/27/17 0838 11/27/17 0906 11/27/17 1401 11/28/17 0556 11/29/17 0545  NA 136 138  --  138 141  K 3.8 4.1  --  4.6 4.3  CL 102 101  --  107 110  CO2 24  --   --  23 25  GLUCOSE 123* 124*  --  131* 126*  BUN 30* 32*  --  23* 14  CREATININE 2.82* 2.60* 2.52* 1.34* 0.76  CALCIUM 9.5  --   --  9.1 9.4   Liver Function Tests: Recent Labs  Lab 11/27/17 0838  AST 21  ALT 14*  ALKPHOS 96  BILITOT 0.7  PROT 7.7  ALBUMIN 4.0   No results for input(s): LIPASE, AMYLASE in the last 168 hours. Recent Labs  Lab 11/27/17 1022  AMMONIA 44*   CBC: Recent Labs  Lab 11/27/17 0838 11/27/17 0906 11/27/17 1401 11/28/17 0556  WBC 10.8*  --  10.4 8.3  NEUTROABS 6.6  --   --   --   HGB 12.5* 12.6* 12.2* 12.4*  HCT 35.6* 37.0* 36.0* 35.8*  MCV 92.5  --  93.3 92.3  PLT 213  --  212 212   Cardiac Enzymes: Recent Labs  Lab 11/27/17 0838  TROPONINI <0.03   BNP: Invalid input(s): POCBNP CBG: Recent Labs  Lab 11/28/17 0748 11/28/17 1123 11/28/17 1712 11/28/17 2113 11/29/17 0754  GLUCAP 130* 94 126* 138* 142*   D-Dimer No results for input(s): DDIMER in the last 72 hours. Hgb A1c Recent Labs    11/27/17 1403  HGBA1C 5.1   Lipid Profile No results for input(s): CHOL, HDL, LDLCALC, TRIG, CHOLHDL, LDLDIRECT in the last 72 hours. Thyroid function studies Recent Labs    11/27/17 1403  TSH 0.662   Anemia work up No results for input(s): VITAMINB12, FOLATE, FERRITIN, TIBC, IRON, RETICCTPCT in the last 72 hours. Urinalysis    Component Value Date/Time   COLORURINE YELLOW 11/27/2017 1045   APPEARANCEUR CLOUDY (A) 11/27/2017 1045   LABSPEC 1.014 11/27/2017 1045   PHURINE 5.0 11/27/2017 1045   GLUCOSEU NEGATIVE 11/27/2017 1045   HGBUR NEGATIVE 11/27/2017 [place] 11/27/2017 1045   KETONESUR NEGATIVE 11/27/2017 1045   PROTEINUR NEGATIVE 11/27/2017 1045   UROBILINOGEN 0.2 08/27/2017 1538    NITRITE NEGATIVE 11/27/2017 [place] 11/27/2017 1045    Microbiology Recent Results (from the past 240 hour(s))  Culture, blood (routine x 2)     Status: None (Preliminary result)   Collection Time: 11/27/17 10:00 AM  Result Value Ref Range Status   Specimen Description BLOOD LEFT ANTECUBITAL  Final   Special Requests   Final    BOTTLES DRAWN AEROBIC AND ANAEROBIC Blood Culture adequate volume   Culture   Final    NO GROWTH 1 DAY Performed at [facility], [place] [address] [place], [place] [address]    Report Status PENDING  Incomplete  Culture, blood (routine x 2)     Status: None (Preliminary result)   Collection Time: 11/27/17 10:02 AM  Result Value Ref Range Status   Specimen Description BLOOD BLOOD RIGHT HAND  Final   Special Requests   Final    BOTTLES DRAWN AEROBIC AND ANAEROBIC Blood Culture adequate volume   Culture   Final    NO GROWTH 1 DAY Performed at [facility], [place] [address], [place], [place] [address]    Report Status PENDING  Incomplete    Time coordinating discharge: Approximately 40 minutes  Vance Gather, MD  [facility] 11/29/2017, 10:12 AM Pager [phone]

## 2017-11-29 NOTE — Progress Notes (Signed)
CSW informed by patient's RN that patient's ACT Team has reported that they are unable to transport patient. CSW spoke with patient who confirmed that his ACT Team is unable to transport him home. Patient reports that he has no family/friends that can provide transportation. CSW inquired about patient riding the bus, patient reported that there is no bus stop near his home. CSW inquired about patient's ability to pay for a taxi, patient reported that he does not have any money.  CSW staffed case with [place] Surveyor, quantity, [place] Surveyor, quantity approved taxi voucher for patient.   CSW provided update to patient. CSW provided taxi voucher to Radio broadcast assistant to provide to patient's RN. Patient's RN will call and coordinate transportation via taxi when patient is ready to be discharged.  CSW signing off, no other needs identified at this time.    Abundio Miu, [place] Social Worker [facility] Cell#: [phone]

## 2017-12-02 LAB — CULTURE, BLOOD (ROUTINE X 2)
CULTURE: NO GROWTH
CULTURE: NO GROWTH
Special Requests: ADEQUATE
Special Requests: ADEQUATE

## 2017-12-03 MED ORDER — TRIAMCINOLONE ACETONIDE 40 MG/ML IJ SUSP
40.0000 mg | INTRAMUSCULAR | Status: AC | PRN
  Administered 2017-11-17: 40 mg via INTRA_ARTICULAR

## 2017-12-03 MED ORDER — BUPIVACAINE HCL 0.5 % IJ SOLN
2.0000 mL | INTRAMUSCULAR | Status: AC | PRN
  Administered 2017-11-17: 2 mL via INTRA_ARTICULAR

## 2017-12-14 ENCOUNTER — Ambulatory Visit (INDEPENDENT_AMBULATORY_CARE_PROVIDER_SITE_OTHER): Payer: Medicare Other | Admitting: Specialist

## 2017-12-16 ENCOUNTER — Ambulatory Visit (INDEPENDENT_AMBULATORY_CARE_PROVIDER_SITE_OTHER): Payer: Medicare Other | Admitting: Specialist

## 2017-12-23 ENCOUNTER — Ambulatory Visit (INDEPENDENT_AMBULATORY_CARE_PROVIDER_SITE_OTHER): Payer: Medicare Other | Admitting: Surgery

## 2017-12-23 ENCOUNTER — Encounter (INDEPENDENT_AMBULATORY_CARE_PROVIDER_SITE_OTHER): Payer: Self-pay | Admitting: Surgery

## 2017-12-23 VITALS — BP 127/84 | HR 95 | Ht 73.0 in | Wt 185.0 lb

## 2017-12-23 DIAGNOSIS — Z981 Arthrodesis status: Secondary | ICD-10-CM | POA: Diagnosis not present

## 2017-12-23 DIAGNOSIS — Z4889 Encounter for other specified surgical aftercare: Secondary | ICD-10-CM

## 2017-12-23 NOTE — Progress Notes (Signed)
Office Visit Note   Patient: [name]           Date of Birth: [birth year]           MRN: [id number] Visit Date: 12/23/2017              Requested by: Nolene Ebbs, MD [address] [place],  [address] PCP: Nolene Ebbs, MD   Assessment & Plan: Visit Diagnoses:  1. Status post lumbar spinal fusion   2. Encounter for other specified surgical aftercare     Plan: Since patient continues to have ongoing pain that has failed conservative treatment with SI joint injection will schedule CT lumbar spine. Follow-up with Dr. Louanne Skye after to discuss results and further treatment options. Patient has ongoing gait and balance issues and has a right ventricular peritoneal shunt that was done by neurosurgeon Dr. Dominica Severin cram. I previously recommended follow-up with him and he reports that he has not done so yet. I again recommend that he call to schedule an appointment.  Follow-Up Instructions: Return in about 2 weeks (around 01/06/2018).   Orders:  Orders Placed This Encounter  Procedures  . CT LUMBAR SPINE WO CONTRAST   No orders of the defined types were placed in this encounter.     Procedures: No procedures performed   Clinical Data: No additional findings.   Subjective: Chief Complaint  Patient presents with  . Lower Back - Follow-up    HPI Patient returns for recheck. He is status post L5-S1 bilateral tlif 06/25/2017. Sent him for bilateral SI joint injections with Dr. Ernestina Patches and he states that he only had minimal improvement for a couple of hours. Still having balance issues. Status post right ventricular peritoneal  shunt by Dr. Dominica Severin cram 10/30/2016 and I had advised patient last office visit to follow up with him. Reports that he has not made an appointment. Review of Systems No complaints of cardiac pulmonary GI GU issues  Objective: Vital Signs: BP 127/84   Pulse 95   Ht 6\' 1"  (1.854 m)   Wt 185 lb (83.9 kg)   BMI 24.41 kg/m   Physical Exam  Eyes:  Pupils are equal, round, and reactive to light.  Pulmonary/Chest: No respiratory distress.  Musculoskeletal:  Gait is unsteady. Lumbar paraspinal tenderness. Tender over the bilateral SI joints. Negative logroll bilateral hips. Negative straight leg raise.  Skin: Skin is warm and dry.    Ortho Exam  Specialty Comments:  No specialty comments available.  Imaging: No results found.   PMFS History: Patient Active Problem List   Diagnosis Date Noted  . AKI (acute kidney injury) ([place]) 11/27/2017  . Acute urinary retention 11/27/2017  . Anemia due to blood loss 06/30/2017    Class: Acute  . Diabetes mellitus without complication ([place]) 68/09/5725    Class: Chronic  . Spinal stenosis of lumbar region 06/29/2017    Class: Chronic  . Cauda equina compression ([place]) 06/29/2017    Class: Acute  . Spondylolisthesis, lumbar region 06/29/2017    Class: Chronic  . History of lumbar spinal fusion 06/26/2017  . Myelopathy ([place]) 06/24/2017  . Low back pain 06/24/2017  . NPH (normal pressure hydrocephalus) 10/30/2016  . Normal pressure hydrocephalus 09/01/2016  . Bilateral sciatica 09/01/2016  . Altered mental status 07/01/2016  . Acute encephalopathy 07/01/2016  . Hydrocephalus in adult 07/01/2016  . Shaking spells 06/01/2016  . Gait disturbance 04/10/2015  . Numbness 04/10/2015  . Lumbar radicular pain 04/10/2015  . Lumbar spondylosis 04/10/2015  .  Bipolar I disorder with depression, severe ([place]) 06/30/2014  . Suicidal ideations 06/30/2014  . Ulcer of lower limb, unspecified 02/20/2014  . CELLULITIS AND ABSCESS OF LEG EXCEPT FOOT 11/08/2007   Past Medical History:  Diagnosis Date  . Anxiety   . Bipolar disorder ([place])   . Chronic back pain   . Depression   . Diabetes mellitus without complication ([place])   . Diabetic neuropathy ([place])   . DVT (deep venous thrombosis) ([place])   . Enlarged prostate   . Headache   . Hepatitis    Hepatitis C - has been treated with Harvoni (cleared  in July, 2017)  . History of colon polyps   . Mental disorder    bipolar  . Peripheral vascular disease ([place])   . Pneumonia    hx of-about 36yrs ago  . Schizophrenia ([place])     Family History  Problem Relation Age of Onset  . Heart attack Father   . Anesthesia problems Neg Hx   . Hypotension Neg Hx   . Malignant hyperthermia Neg Hx   . Pseudochol deficiency Neg Hx     Past Surgical History:  Procedure Laterality Date  . APPENDECTOMY     early 43's  . BACK SURGERY  05/2011   x 5  . blood clot in groin  early 80's  . CARPAL TUNNEL RELEASE     left  . CERVICAL SPINE SURGERY  2009  . COLONOSCOPY    . HERNIA REPAIR     early 2000's  . SHOULDER SURGERY  70's   right  . VENTRICULOPERITONEAL SHUNT N/A 10/30/2016   Procedure: Right Ventricular Peritoneal Shunt;  Surgeon: Kary Kos, MD;  Location: [facility];  Service: Neurosurgery;  Laterality: N/A;   Social History   Occupational History  . Not on file  Tobacco Use  . Smoking status: Current Every Day Smoker    Packs/day: 1.00    Years: 40.00    Pack years: 40.00    Types: Cigarettes  . Smokeless tobacco: Never Used  . Tobacco comment: pt states that he is ready to quit smoking but thinks he will need patches advised him to speak with his PCP which he is seaing this Thursday 3/26  Substance and Sexual Activity  . Alcohol use: No  . Drug use: No  . Sexual activity: Yes

## 2018-01-12 ENCOUNTER — Other Ambulatory Visit: Payer: Medicare Other

## 2018-03-23 ENCOUNTER — Emergency Department (HOSPITAL_COMMUNITY)
Admission: EM | Admit: 2018-03-23 | Discharge: 2018-03-24 | Disposition: A | Discharge status: Home / Self Care | Payer: Medicare Other | Attending: Emergency Medicine | Admitting: Emergency Medicine

## 2018-03-23 ENCOUNTER — Emergency Department (HOSPITAL_COMMUNITY): Payer: Medicare Other

## 2018-03-23 ENCOUNTER — Other Ambulatory Visit: Payer: Self-pay

## 2018-03-23 ENCOUNTER — Encounter (HOSPITAL_COMMUNITY): Payer: Self-pay

## 2018-03-23 DIAGNOSIS — N3001 Acute cystitis with hematuria: Secondary | ICD-10-CM

## 2018-03-23 DIAGNOSIS — E119 Type 2 diabetes mellitus without complications: Secondary | ICD-10-CM | POA: Diagnosis not present

## 2018-03-23 DIAGNOSIS — T887XXA Unspecified adverse effect of drug or medicament, initial encounter: Secondary | ICD-10-CM | POA: Insufficient documentation

## 2018-03-23 DIAGNOSIS — N179 Acute kidney failure, unspecified: Secondary | ICD-10-CM | POA: Diagnosis present

## 2018-03-23 DIAGNOSIS — N390 Urinary tract infection, site not specified: Secondary | ICD-10-CM | POA: Diagnosis not present

## 2018-03-23 DIAGNOSIS — Z7984 Long term (current) use of oral hypoglycemic drugs: Secondary | ICD-10-CM | POA: Insufficient documentation

## 2018-03-23 DIAGNOSIS — R410 Disorientation, unspecified: Secondary | ICD-10-CM | POA: Insufficient documentation

## 2018-03-23 DIAGNOSIS — G9340 Encephalopathy, unspecified: Secondary | ICD-10-CM | POA: Diagnosis present

## 2018-03-23 DIAGNOSIS — Z79899 Other long term (current) drug therapy: Secondary | ICD-10-CM | POA: Diagnosis not present

## 2018-03-23 DIAGNOSIS — F1410 Cocaine abuse, uncomplicated: Secondary | ICD-10-CM | POA: Diagnosis present

## 2018-03-23 DIAGNOSIS — F17200 Nicotine dependence, unspecified, uncomplicated: Secondary | ICD-10-CM | POA: Diagnosis not present

## 2018-03-23 DIAGNOSIS — F17210 Nicotine dependence, cigarettes, uncomplicated: Secondary | ICD-10-CM | POA: Diagnosis not present

## 2018-03-23 DIAGNOSIS — M545 Low back pain, unspecified: Secondary | ICD-10-CM | POA: Diagnosis present

## 2018-03-23 DIAGNOSIS — Y658 Other specified misadventures during surgical and medical care: Secondary | ICD-10-CM | POA: Insufficient documentation

## 2018-03-23 LAB — COMPREHENSIVE METABOLIC PANEL
ALT: 9 U/L — AB (ref 17–63)
AST: 14 U/L — AB (ref 15–41)
Albumin: 3.7 g/dL (ref 3.5–5.0)
Alkaline Phosphatase: 81 U/L (ref 38–126)
Anion gap: 10 (ref 5–15)
BILIRUBIN TOTAL: 0.6 mg/dL (ref 0.3–1.2)
BUN: 11 mg/dL (ref 6–20)
CO2: 26 mmol/L (ref 22–32)
Calcium: 9.7 mg/dL (ref 8.9–10.3)
Chloride: 103 mmol/L (ref 101–111)
Creatinine, Ser: 1.32 mg/dL — ABNORMAL HIGH (ref 0.61–1.24)
GFR calc Af Amer: >60 mL/min (ref >60)
GFR, EST NON AFRICAN AMERICAN: 57 mL/min — AB (ref >60)
Glucose, Bld: 89 mg/dL (ref 65–99)
POTASSIUM: 4.5 mmol/L (ref 3.5–5.1)
Sodium: 139 mmol/L (ref 135–145)
TOTAL PROTEIN: 7.2 g/dL (ref 6.5–8.1)

## 2018-03-23 LAB — CBC
HCT: 36.2 % — ABNORMAL LOW (ref 39.0–52.0)
Hemoglobin: 12.2 g/dL — ABNORMAL LOW (ref 13.0–17.0)
MCH: 31.7 pg (ref 26.0–34.0)
MCHC: 33.7 g/dL (ref 30.0–36.0)
MCV: 94 fL (ref 78.0–100.0)
PLATELETS: 203 10*3/uL (ref 150–400)
RBC: 3.85 MIL/uL — ABNORMAL LOW (ref 4.22–5.81)
RDW: 12 % (ref 11.5–15.5)
WBC: 6.5 10*3/uL (ref 4.0–10.5)

## 2018-03-23 LAB — CBG MONITORING, ED: Glucose-Capillary: 91 mg/dL (ref 65–99)

## 2018-03-23 LAB — ACETAMINOPHEN LEVEL: Acetaminophen (Tylenol), Serum: <10 ug/mL — ABNORMAL LOW (ref 10–30)

## 2018-03-23 LAB — ETHANOL

## 2018-03-23 LAB — SALICYLATE LEVEL: Salicylate Lvl: <7 mg/dL (ref 2.8–30.0)

## 2018-03-23 LAB — I-STAT TROPONIN, ED: TROPONIN I, POC: 0 ng/mL (ref 0.00–0.08)

## 2018-03-23 MED ORDER — SODIUM CHLORIDE 0.9 % IV BOLUS
1000.0000 mL | Freq: Once | INTRAVENOUS | Status: AC
  Administered 2018-03-23: 1000 mL via INTRAVENOUS

## 2018-03-23 NOTE — ED Notes (Signed)
Patient transported to X-ray 

## 2018-03-23 NOTE — ED Notes (Signed)
Pt. Unable to stand without falling back. We tried to transfer from bed to wheelchair-- transfer unsuccessful. Patient repositioned, soiled linens changed, and left with urinal and verbal order to use the bathroom so we could collect urine specimen.

## 2018-03-23 NOTE — ED Provider Notes (Signed)
[facility] EMERGENCY DEPARTMENT Provider Note   CSN: [id number] Arrival date & time: 03/23/18  1544     History   Chief Complaint Chief Complaint  Patient presents with  . Altered Mental Status    HPI [name] is a 62 y.o. male here for evaluation of noted decreased mental status from baseline by [facility] nurse earlier this morning who went to see him at home and called EMS.  History is provided by triage note, RN and patient as well as chart review.  Per EMS patient was oriented x3 only and unsteady gait.  Vital signs within normal limits on route.  Patient states that he thinks he may have taken 2 doses of trazodone by mistake. He denies intentional overdose or attempted SI. Remembers waking up in the middle of the night and could not remember if he had taken trazodone so he took another dose.  Currently reports being tired.  Has a chronic cough, unchanged.  He denies recent fevers, chills, sore throat, chest pain, shortness of breath, headache, nausea, vomiting, abdominal pain, dysuria, changes in bowel movements.  No unilateral numbness or weakness. Last crack cocaine use several months ago. Occasional ETOH use. Modifying factors: none.   HPI  Past Medical History:  Diagnosis Date  . Anxiety   . Bipolar disorder ([place])   . Chronic back pain   . Depression   . Diabetes mellitus without complication ([place])   . Diabetic neuropathy ([place])   . DVT (deep venous thrombosis) ([place])   . Enlarged prostate   . Headache   . Hepatitis    Hepatitis C - has been treated with Harvoni (cleared in July, 2017)  . History of colon polyps   . Mental disorder    bipolar  . Peripheral vascular disease ([place])   . Pneumonia    hx of-about 16yrs ago  . Schizophrenia [facility])     Patient Active Problem List   Diagnosis Date Noted  . AKI (acute kidney injury) ([place]) 11/27/2017  . Acute urinary retention 11/27/2017  . Anemia due to blood loss 06/30/2017    Class: Acute  . Diabetes  mellitus without complication ([place]) 94/85/4627    Class: Chronic  . Spinal stenosis of lumbar region 06/29/2017    Class: Chronic  . Cauda equina compression ([place]) 06/29/2017    Class: Acute  . Spondylolisthesis, lumbar region 06/29/2017    Class: Chronic  . History of lumbar spinal fusion 06/26/2017  . Myelopathy ([place]) 06/24/2017  . Low back pain 06/24/2017  . NPH (normal pressure hydrocephalus) 10/30/2016  . Normal pressure hydrocephalus 09/01/2016  . Bilateral sciatica 09/01/2016  . Altered mental status 07/01/2016  . Acute encephalopathy 07/01/2016  . Hydrocephalus in adult 07/01/2016  . Shaking spells 06/01/2016  . Gait disturbance 04/10/2015  . Numbness 04/10/2015  . Lumbar radicular pain 04/10/2015  . Lumbar spondylosis 04/10/2015  . Bipolar I disorder with depression, severe ([place]) 06/30/2014  . Suicidal ideations 06/30/2014  . Ulcer of lower limb, unspecified 02/20/2014  . CELLULITIS AND ABSCESS OF LEG EXCEPT FOOT 11/08/2007    Past Surgical History:  Procedure Laterality Date  . APPENDECTOMY     early 56's  . BACK SURGERY  05/2011   x 5  . blood clot in groin  early 80's  . CARPAL TUNNEL RELEASE     left  . CERVICAL SPINE SURGERY  2009  . COLONOSCOPY    . HERNIA REPAIR     early 2000's  . SHOULDER  SURGERY  70's   right  . VENTRICULOPERITONEAL SHUNT N/A 10/30/2016   Procedure: Right Ventricular Peritoneal Shunt;  Surgeon: Kary Kos, MD;  Location: [facility];  Service: Neurosurgery;  Laterality: N/A;        Home Medications    Prior to Admission medications   Medication Sig Start Date End Date Taking? Authorizing Provider  celecoxib (CELEBREX) 200 MG capsule Take 1 capsule (200 mg total) by mouth daily. 06/29/17   [name], MD  cholecalciferol (VITAMIN D) 1000 units tablet Take 1,000 Units by mouth daily.    [provider]  cyclobenzaprine (FLEXERIL) 10 MG tablet TAKE 1 TABLET BY MOUTH 3 TIMES A DAY AS NEEDED FOR MUSCLE SPASMS 06/06/17   [provider]  ferrous gluconate (FERGON) 324 MG tablet Take 1 tablet (324 mg total) by mouth 2 (two) times daily with a meal. 06/30/17   [name], MD  finasteride (PROSCAR) 5 MG tablet  12/09/17   [provider]  gabapentin (NEURONTIN) 300 MG capsule Take 1 capsule (300 mg total) by mouth at bedtime. 06/29/17   [name], MD  ibuprofen (ADVIL,MOTRIN) 600 MG tablet  05/27/17   [provider]  INVEGA SUSTENNA 234 MG/1.5ML SUSP injection  12/08/17   [provider]  metFORMIN (GLUCOPHAGE) 500 MG tablet Take 500 mg by mouth daily with breakfast.     [provider]  paliperidone (INVEGA) 3 MG 24 hr tablet Take 1 tablet (3 mg total) by mouth at bedtime. 06/29/17   [name], MD  QUEtiapine (SEROQUEL) 400 MG tablet  11/25/17   [provider]  sertraline (ZOLOFT) 100 MG tablet Take 100 mg by mouth at bedtime.    [provider]  sitaGLIPtin (JANUVIA) 100 MG tablet Take 100 mg by mouth daily with breakfast.    [provider]  tamsulosin (FLOMAX) 0.4 MG CAPS capsule Take 0.4 mg by mouth daily after supper.     [provider]  traZODone (DESYREL) 150 MG tablet Take 0.5 tablets (75 mg total) by mouth at bedtime. 11/29/17   Patrecia Pour, MD    Family History Family History  Problem Relation Age of Onset  . Heart attack Father   . Anesthesia problems Neg Hx   . Hypotension Neg Hx   . Malignant hyperthermia Neg Hx   . Pseudochol deficiency Neg Hx     Social History Social History   Tobacco Use  . Smoking status: Current Every Day Smoker    Packs/day: 1.00    Years: 40.00    Pack years: 40.00    Types: Cigarettes  . Smokeless tobacco: Never Used  . Tobacco comment: pt states that he is ready to quit smoking but thinks he will need patches advised him to speak with his PCP which he is seaing this Thursday 3/26  Substance Use Topics  . Alcohol use: No  . Drug use: No     Allergies    Penicillins   Review of Systems Review of Systems  Constitutional:       Feels tired  Respiratory: Positive for cough (chronic).   All other systems reviewed and are negative.    Physical Exam Updated Vital Signs BP (!) 145/83 (BP Location: Right Arm)   Pulse 95   Temp 97.9 F (36.6 C)   Resp 14   Ht 6\' 1"  (1.854 m)   Wt 83.9 kg (185 lb)   SpO2 100%   BMI 24.41 kg/m  Physical Exam  Constitutional: He is oriented to person, place, and time. He appears well-developed and well-nourished. No distress.  Non toxic, slightly somnolent but able to hold conversation without falling asleep.   HENT:  Head: Normocephalic and atraumatic.  Nose: Nose normal.  Mouth/Throat: No oropharyngeal exudate.  Dry, chapped lips and tongue.   Eyes: Pupils are equal, round, and reactive to light. Conjunctivae and EOM are normal.  Neck: Normal range of motion.  Cardiovascular: Normal rate, regular rhythm, normal heart sounds and intact distal pulses.  No murmur heard. 2+ DP and radial pulses bilaterally. No LE edema.   Pulmonary/Chest: Effort normal. He has decreased breath sounds in the right lower field and the left lower field.  No crackles, wheezing. Normal WOB.   Abdominal: Soft. Bowel sounds are normal. There is no tenderness.  No G/R/R. No suprapubic or CVA tenderness.   Musculoskeletal: Normal range of motion. He exhibits no deformity.  Neurological: He is alert and oriented to person, place, and time.  Alert and oriented to self, place, month, year and events.  Speech is fluent without obvious dysarthria or dysphasia. Strength 5/5 with hand grip and ankle F/E.   Sensation to light touch intact in hands and feet. Slow but steady gait with one person assist (usually walks with walker).  No pronator drift. No leg drop.  Normal finger-to-nose and heel to shin bilaterally. CN I and VIII not tested. CN II-XII grossly intact bilaterally.   Skin: Skin is warm and dry. Capillary refill  takes less than 2 seconds.  Psychiatric: He has a normal mood and affect. His behavior is normal. Judgment and thought content normal.  Nursing note and vitals reviewed.    ED Treatments / Results  Labs (all labs ordered are listed, but only abnormal results are displayed) Labs Reviewed  COMPREHENSIVE METABOLIC PANEL - Abnormal; Notable for the following components:      Result Value   Creatinine, Ser 1.32 (*)    AST 14 (*)    ALT 9 (*)    GFR calc non Af Amer 57 (*)    All other components within normal limits  CBC - Abnormal; Notable for the following components:   RBC 3.85 (*)    Hemoglobin 12.2 (*)    HCT 36.2 (*)    All other components within normal limits  ACETAMINOPHEN LEVEL - Abnormal; Notable for the following components:   Acetaminophen (Tylenol), Serum <10 (*)    All other components within normal limits  ETHANOL  SALICYLATE LEVEL  URINALYSIS, ROUTINE W REFLEX MICROSCOPIC  RAPID URINE DRUG SCREEN, HOSP PERFORMED  CBG MONITORING, ED  CBG MONITORING, ED  I-STAT TROPONIN, ED    EKG EKG Interpretation  Date/Time:  Wednesday March 23 2018 15:53:13 EDT Ventricular Rate:  98 PR Interval:    QRS Duration: 76 QT Interval:  360 QTC Calculation: 460 R Axis:   58 Text Interpretation:  Sinus rhythm Probable left atrial enlargement Anteroseptal infarct, age indeterminate Confirmed by Davonna Belling [phone]) on 03/23/2018 6:11:16 PM   Radiology Dg Chest 2 View  Result Date: 03/23/2018 CLINICAL DATA:  Altered mental status, current smoker EXAM: CHEST - 2 VIEW COMPARISON:  11/27/2017 FINDINGS: Emphysematous hyperinflation of the lungs. Bandlike atelectasis and/or scarring is identified at the left lung base. Heart size and mediastinal contours within normal limits with minimal aortic atherosclerosis at the arch. No pulmonary consolidation, effusion or pneumothorax. Cardiac monitoring leads project over the chest. The patient is status post ACDF of the lower cervical  spine and lumbar spinal fusion. IMPRESSION: Emphysematous hyperinflation of the lungs with bandlike atelectasis and/or scarring at the left lung base. No active pulmonary disease. Aortic atherosclerosis without aneurysm. Electronically Signed   By: Ashley Royalty M.D.   On: 03/23/2018 18:34   Ct Head Wo Contrast  Result Date: 03/23/2018 CLINICAL DATA:  Altered mental status today. EXAM: CT HEAD WITHOUT CONTRAST TECHNIQUE: Contiguous axial images were obtained from the base of the skull through the vertex without intravenous contrast. COMPARISON:  Head CT scan 11/27/2017, 02/13/2017 and 12/22/2016. FINDINGS: Brain: No evidence of acute infarction, hemorrhage, hydrocephalus, extra-axial collection or mass lesion/mass effect. Right parietal approach ventriculostomy shunt catheter is in place. No hydrocephalus. Mild periventricular hypoattenuation is unchanged. Vascular: No hyperdense vessel or unexpected calcification. Skull: Intact. Sinuses/Orbits: Negative. Other: None. IMPRESSION: No acute abnormality. Negative for hydrocephalus with a ventriculostomy shunt catheter in place. Electronically Signed   By: Inge Rise M.D.   On: 03/23/2018 18:59    Procedures Procedures (including critical care time)  Medications Ordered in ED Medications  sodium chloride 0.9 % bolus 1,000 mL (has no administration in time range)  sodium chloride 0.9 % bolus 1,000 mL (0 mLs Intravenous Stopped 03/23/18 2009)     Initial Impression / Assessment and Plan / ED Course  I have reviewed the triage vital signs and the nursing notes.  Pertinent labs & imaging results that were available during my care of the patient were reviewed by me and considered in my medical decision making (see chart for details).  Clinical Course as of Mar 23 2229  Wed Mar 23, 2018  1850 Creatinine(!): 1.32 [CG]  1850 Hemoglobin(!): 12.2 [CG]  2225 Patient ambulated in room by me with one person assist without difficulty. Patient wanted to go  walk outside. RN in room. Again asked for Ua, he will do condom cath.    [CG]    Clinical Course User Index [CG] Kinnie Feil, PA-C   62 year old here for noted altered mental status by [facility] nurse earlier today.  Patient may have taken second dose of trazodone earlier this morning.  He denies intentional OD or suicide attempt.  He has slightly somnolent but offers no other complaints today.  Differential diagnosis includes occult infection, structural/intracranial pathology, electrolyte abnormalities, EtOH/illicit drug use, unintentional OD of trazadone.   Labs, EKG, chest x-ray reviewed remarkable for creatinine 1.32, hemoglobin 12.2.  Patient does look slightly dry, will give 1 L IV fluid.  Pending urinalysis, urine drug screen, troponin, EtOH/APAP/ASA levels, CT.  Final Clinical Impressions(s) / ED Diagnoses   2225: CT head unremarkable.  Remaining labs unremarkable.  Pending urinalysis and urine drug screen.  I have reevaluated patient multiple times and encouraged to urine sample.  He has been ambulatory at baseline, ambulated in the room by me without difficulty.  Will handoff patient to oncoming ED PA pending urinalysis.  Anticipate discharge back home given reassuring lab work, imaging.  Patient discussed with Dr. Alvino Chapel. Final diagnoses:  Episode of confusion  Side effect of drug    ED Discharge Orders    None       Arlean Hopping 03/23/18 2230    Davonna Belling, MD 03/25/18 [phone]

## 2018-03-23 NOTE — Discharge Instructions (Addendum)
Work up in [place] was reassuring. Brief episode of confusion may have been from trazadone side effect.   Resume all medications.    You were found to have a UTI. Please take all of your antibiotics until finished!   You may develop abdominal discomfort or diarrhea from the antibiotic.  You may help offset this with probiotics which you can buy or get in yogurt. Do not eat or take the probiotics until 2 hours after your antibiotic. Do not take your medicine if develop an itchy rash, swelling in your mouth or lips, or difficulty breathing.   Follow up with PCP this week.   If you develop worsening or new concerning symptoms you can return to the emergency department for re-evaluation.

## 2018-03-23 NOTE — ED Triage Notes (Signed)
Pt arrives from home via [facility]; called by [facility] nurse who stated pt is decreased in mental status from baseline. Per EMS pt is oriented x3, disoriented to time and alert with unsteady gait. EMS stroke scale negative; VS 144/98; 100 HR; 18 RR; 97 on RA; 87 CBG Pt told EMS he believes he may have accidentally taken two nighttime meds last night

## 2018-03-23 NOTE — ED Notes (Signed)
Condom cath applied

## 2018-03-24 ENCOUNTER — Encounter (HOSPITAL_COMMUNITY): Payer: Self-pay

## 2018-03-24 DIAGNOSIS — E119 Type 2 diabetes mellitus without complications: Secondary | ICD-10-CM | POA: Diagnosis not present

## 2018-03-24 DIAGNOSIS — N179 Acute kidney failure, unspecified: Secondary | ICD-10-CM | POA: Diagnosis not present

## 2018-03-24 DIAGNOSIS — G9340 Encephalopathy, unspecified: Secondary | ICD-10-CM | POA: Diagnosis not present

## 2018-03-24 DIAGNOSIS — F17200 Nicotine dependence, unspecified, uncomplicated: Secondary | ICD-10-CM | POA: Diagnosis not present

## 2018-03-24 DIAGNOSIS — N390 Urinary tract infection, site not specified: Secondary | ICD-10-CM

## 2018-03-24 DIAGNOSIS — M5440 Lumbago with sciatica, unspecified side: Secondary | ICD-10-CM

## 2018-03-24 DIAGNOSIS — F1410 Cocaine abuse, uncomplicated: Secondary | ICD-10-CM | POA: Diagnosis not present

## 2018-03-24 DIAGNOSIS — T887XXA Unspecified adverse effect of drug or medicament, initial encounter: Secondary | ICD-10-CM | POA: Diagnosis not present

## 2018-03-24 LAB — RAPID URINE DRUG SCREEN, HOSP PERFORMED
AMPHETAMINES: NOT DETECTED
Barbiturates: NOT DETECTED
Benzodiazepines: NOT DETECTED
Cocaine: POSITIVE — AB
OPIATES: NOT DETECTED
Tetrahydrocannabinol: NOT DETECTED

## 2018-03-24 LAB — URINALYSIS, ROUTINE W REFLEX MICROSCOPIC
Bilirubin Urine: NEGATIVE
GLUCOSE, UA: NEGATIVE mg/dL
Ketones, ur: NEGATIVE mg/dL
NITRITE: POSITIVE — AB
PROTEIN: NEGATIVE mg/dL
Specific Gravity, Urine: 1.01 (ref 1.005–1.030)
pH: 5 (ref 5.0–8.0)

## 2018-03-24 MED ORDER — LIDOCAINE 5 % EX PTCH: 1.0000 | MEDICATED_PATCH | CUTANEOUS | 0 refills | Status: DC

## 2018-03-24 MED ORDER — SODIUM CHLORIDE 0.9 % IV BOLUS
500.0000 mL | Freq: Once | INTRAVENOUS | Status: AC
  Administered 2018-03-24: 500 mL via INTRAVENOUS

## 2018-03-24 MED ORDER — SODIUM CHLORIDE 0.9 % IV BOLUS: 500.0000 mL | Freq: Once | INTRAVENOUS | Status: DC

## 2018-03-24 MED ORDER — AZTREONAM 2 G IJ SOLR
2.0000 g | Freq: Once | INTRAMUSCULAR | Status: AC
  Administered 2018-03-24: 2 g via INTRAVENOUS
  Filled 2018-03-24: qty 2

## 2018-03-24 MED ORDER — SULFAMETHOXAZOLE-TRIMETHOPRIM 800-160 MG PO TABS: 1.0000 | ORAL_TABLET | Freq: Two times a day (BID) | ORAL | 0 refills | Status: DC

## 2018-03-24 MED ORDER — NICOTINE 7 MG/24HR TD PT24: 7.0000 mg | MEDICATED_PATCH | Freq: Every day | TRANSDERMAL | 0 refills | Status: DC

## 2018-03-24 NOTE — ED Notes (Signed)
Pt out of room again walking around nursing station , pt laughs when asks to go back to room.

## 2018-03-24 NOTE — ED Provider Notes (Signed)
Care assumed from Melina Schools (who assumed care from Carmon Sails at shift change with UA pending.   In brief, this patient is a 62 year old male that presented to the emergency department today for altered mental status per home health nurse.  Patient did report that he took 2 trazodone earlier that day.  No focal neurologic deficits were evident on initial exam.  He was reported to be somnolent on exam.  Work-up was initiated to rule out occult infection versus structural/intracranial pathology, electrolyte abnormalities, alcohol/illicit drug use or unintentional overdose of trazodone.  Patient's work-up was overall reassuring.  His urinalysis and drug screen are pending at time of signout.  Patient had several cases of incontinence that did not allow for urine to be obtained during previous providers signout.  There was question if UTI.  PLEASE SEE PREVIOUS PROVIDERS NOTES FOR MORE DETAILS.  PLAN: If UA positive for UTI, start on abx and get admitted for observation for AMS with UTI. If UA negative, d/c home with close follow up with PCP  Urinalysis    Component Value Date/Time   COLORURINE YELLOW 03/24/2018 0818   APPEARANCEUR HAZY (A) 03/24/2018 0818   LABSPEC 1.010 03/24/2018 0818   PHURINE 5.0 03/24/2018 0818   GLUCOSEU NEGATIVE 03/24/2018 0818   HGBUR SMALL (A) 03/24/2018 0818   BILIRUBINUR NEGATIVE 03/24/2018 0818   KETONESUR NEGATIVE 03/24/2018 0818   PROTEINUR NEGATIVE 03/24/2018 0818   UROBILINOGEN 0.2 08/27/2017 1538   NITRITE POSITIVE (A) 03/24/2018 0818   LEUKOCYTESUR LARGE (A) 03/24/2018 0818    MDM:  Patient's mental status is improved.  Patient speaking full sentences without difficulty.  No slurred speech.  No facial droop.  No focal neurologic deficits.  Normal gait.  Urinalysis with evidence of infection.  Patient has nitrate positive, large leukocytes, greater than 50 white blood cells, and many bacteria. Urine culture sent. Start on IV antibiotics and consult  hospitalist for admission for observation.  Scheduled Meds: Continuous Infusions: . aztreonam      Dr. Lorin Mercy has seen the patient and evaluated. Note in system. She feels patient does not need admission at this time and can be discharged home with abx for UTI. Recommends lidocaine patch and nicotine patch as well. I advised the patient to follow-up with PCP this week. Return precautions discussed. Patient discharged home.   1. Episode of confusion   2. Side effect of drug   3. Acute cystitis with hematuria       Lorelle Gibbs 03/24/18 1031    Hayden Rasmussen, MD 03/26/18 [phone]

## 2018-03-24 NOTE — ED Notes (Signed)
Pt agrees to call next time he feels the urge to urinate

## 2018-03-24 NOTE — ED Provider Notes (Addendum)
Care assumed from previous provider PA Gibbons. Please see their note for further details to include full history and physical. To summarize in short pt is a 62 year old male that comes into the ED for altered mental status.. Case discussed, plan agreed upon.   At time of care handoff is awaiting UA to rule out infection however provider felt patient could be discharged home if this was normal.  Patient is at baseline per prior provider.  She ambulated patient herself the patient was able to ambulate with some assistance however patient does use a walker at baseline.  Was informed that patient had a condom cath alone but has not been able to urinate.  Patient was watched several hours without any further urination however he was sleeping the entire time.  He was given additional 500 saline bolus totaling 1.5 L.  I discussed with patient he was agreeable to enter out cath.  This was performed by a nurse but I was informed that they met resistance when unable to get any urine return.  Bladder scan was performed that showed 300 cc of urine in the bladder.  Condom cath was reapplied per nurse.  At 06 14 when I went to check on patient he had urinated the bed and the condom cath was disconnected.  Patient is very aggravated concerning this.  States that when his ride gets here he is going to leave.  I encouraged patient to stay that we need to make sure that his urine is not infected.  Condom cath was reapplied.  The patient asking for cup of coffee.  He is alert oriented x3.  He ambulated from the bed to the chair without any assistance.  No acute distress noted.  If patient is able to urinate and it shows no signs of infection patient can be discharged home per prior providers documentation..  Care handoff to PA Maczis. Pt has pending at this time ua.  Disposition likely home pending lab and test results. Care dicussed and plan agreed upon with oncoming PA. Pt updated on plan of care and is currently  hemodynamically stable at this time with normal vs.          Doristine Devoid, PA-C 03/24/18 0615    Doristine Devoid, PA-C 03/24/18 2542    Davonna Belling, MD 03/25/18 [phone]

## 2018-03-24 NOTE — ED Notes (Signed)
Attempted inserting in-and-out catheter; no urine return and resistance met 

## 2018-03-24 NOTE — ED Notes (Signed)
Regular diet breakfast tray ordered 

## 2018-03-24 NOTE — Consult Note (Signed)
Consultation Note    [name] PNT:[id number] DOB: [birth year] DOA: 03/23/2018  PCP: Nolene Ebbs, MD Consultants:  [facility] Halsted - neurosurgery; [place] - PM&R; psychiatry Patient coming from:  Home - lives alone in an apartment; NOK: Caren Macadam, "that my girl", [phone]  Chief Complaint: AMS  HPI: [name] is a 62 y.o. male with medical history significant of schizophrenia; cocaine abuse; PVD; DVT; and diabetes presenting with AMS.  He "couldn't walk and that was the main thing".  He has no idea why that happened.  He denies confusion.  No urinary symptoms.  Last used cocaine a few days ago - "they won't give me no medicine for my back and my back be killing me so I turned to what I used to do."   +tobacco, >1/2ppd.  Alcohol very seldom.     ED Course:  In ER for 18 hours.  Presented with AMS - full evaluation.  Took 2 trazodone prior to coming in.  Has a "major" UTI.  He is no longer altered.     Review of Systems: As per HPI; otherwise review of systems reviewed and negative.   Ambulatory Status:  Ambulates with a cane/walker for a few months now because of his back pain  Past Medical History:  Diagnosis Date  . Anxiety   . Bipolar disorder ([place])   . Chronic back pain   . Depression   . Diabetes mellitus without complication ([place])   . Diabetic neuropathy ([place])   . DVT (deep venous thrombosis) ([place])   . Enlarged prostate   . Headache   . Hepatitis    Hepatitis C - has been treated with Harvoni (cleared in July, 2017)  . History of colon polyps   . Peripheral vascular disease ([place])   . Pneumonia    hx of-about 33yrs ago  . Schizophrenia [facility])     Past Surgical History:  Procedure Laterality Date  . APPENDECTOMY     early 72's  . BACK SURGERY  05/2011   x 5  . blood clot in groin  early 80's  . CARPAL TUNNEL RELEASE     left  . CERVICAL SPINE SURGERY  2009  . COLONOSCOPY    . HERNIA REPAIR     early 2000's  . SHOULDER SURGERY  70's   right  .  VENTRICULOPERITONEAL SHUNT N/A 10/30/2016   Procedure: Right Ventricular Peritoneal Shunt;  Surgeon: Kary Kos, MD;  Location: [place];  Service: Neurosurgery;  Laterality: N/A;    Social History   Socioeconomic History  . Marital status: Single    Spouse name: Not on file  . Number of children: Not on file  . Years of education: Not on file  . Highest education level: Not on file  Occupational History  . Occupation: disabled  Social Needs  . Financial resource strain: Not on file  . Food insecurity:    Worry: Not on file    Inability: Not on file  . Transportation needs:    Medical: Not on file    Non-medical: Not on file  Tobacco Use  . Smoking status: Current Every Day Smoker    Packs/day: 1.00    Years: 40.00    Pack years: 40.00    Types: Cigarettes  . Smokeless tobacco: Never Used  Substance and Sexual Activity  . Alcohol use: Yes    Comment: occasional use  . Drug use: Yes    Types: Cocaine, Marijuana  . Sexual activity: Yes  Lifestyle  .  Physical activity:    Days per week: Not on file    Minutes per session: Not on file  . Stress: Not on file  Relationships  . Social connections:    Talks on phone: Not on file    Gets together: Not on file    Attends religious service: Not on file    Active member of club or organization: Not on file    Attends meetings of clubs or organizations: Not on file    Relationship status: Not on file  . Intimate partner violence:    Fear of current or ex partner: Not on file    Emotionally abused: Not on file    Physically abused: Not on file    Forced sexual activity: Not on file  Other Topics Concern  . Not on file  Social History Narrative  . Not on file    Allergies  Allergen Reactions  . Penicillins Hives and Other (See Comments)    Has patient had a PCN reaction causing immediate rash, facial/tongue/throat swelling, SOB or lightheadedness with hypotension: No Has patient had a PCN reaction causing severe rash  involving mucus membranes or skin necrosis: No Has patient had a PCN reaction that required hospitalization No Has patient had a PCN reaction occurring within the last 10 years: No If all of the above answers are "NO", then may proceed with Cephalosporin use.    Family History  Problem Relation Age of Onset  . Heart attack Father   . Anesthesia problems Neg Hx   . Hypotension Neg Hx   . Malignant hyperthermia Neg Hx   . Pseudochol deficiency Neg Hx     Prior to Admission medications   Medication Sig Start Date End Date Taking? Authorizing Provider  celecoxib (CELEBREX) 200 MG capsule Take 1 capsule (200 mg total) by mouth daily. 06/29/17   [name], MD  cholecalciferol (VITAMIN D) 1000 units tablet Take 1,000 Units by mouth daily.    [provider]  cyclobenzaprine (FLEXERIL) 10 MG tablet TAKE 1 TABLET BY MOUTH 3 TIMES A DAY AS NEEDED FOR MUSCLE SPASMS 06/06/17   [provider]  ferrous gluconate (FERGON) 324 MG tablet Take 1 tablet (324 mg total) by mouth 2 (two) times daily with a meal. 06/30/17   [name], MD  finasteride (PROSCAR) 5 MG tablet  12/09/17   [provider]  gabapentin (NEURONTIN) 300 MG capsule Take 1 capsule (300 mg total) by mouth at bedtime. 06/29/17   [name], MD  ibuprofen (ADVIL,MOTRIN) 600 MG tablet  05/27/17   [provider]  INVEGA SUSTENNA 234 MG/1.5ML SUSP injection  12/08/17   [provider]  metFORMIN (GLUCOPHAGE) 500 MG tablet Take 500 mg by mouth daily with breakfast.     [provider]  paliperidone (INVEGA) 3 MG 24 hr tablet Take 1 tablet (3 mg total) by mouth at bedtime. 06/29/17   [name], MD  QUEtiapine (SEROQUEL) 400 MG tablet  11/25/17   [provider]  sertraline (ZOLOFT) 100 MG tablet Take 100 mg by mouth at bedtime.    [provider]  sitaGLIPtin (JANUVIA) 100 MG tablet Take 100 mg by mouth daily with breakfast.    [provider]  tamsulosin  (FLOMAX) 0.4 MG CAPS capsule Take 0.4 mg by mouth daily after supper.     [provider]  traZODone (DESYREL) 150 MG tablet Take 0.5 tablets (75 mg total) by mouth at bedtime. 11/29/17  Patrecia Pour, MD    Physical Exam: Vitals:   03/24/18 0230 03/24/18 0600 03/24/18 1100 03/24/18 1115  BP:  (!) 157/59 137/68 (!) 117/94  Pulse: 91 (!) 108 87 96  Resp: 11 16    Temp:      TempSrc:      SpO2: 98% 99% 98% 99%  Weight:      Height:         General:  Appears calm and comfortable and is NAD; somewhat disheveled, appears chronically mentally ill.  He is sitting up in a chair fully dressed. Eyes:   EOMI, normal lids, iris ENT:  grossly normal hearing, lips & tongue, mmm Neck:  no LAD, masses or thyromegaly Cardiovascular:  RRR, no m/r/g. No LE edema.  Respiratory:   CTA bilaterally with no wheezes/rales/rhonchi.  Normal respiratory effort. Abdomen:  soft, NT, ND, NABS Skin:  no rash or induration seen on limited exam Musculoskeletal:  grossly normal tone BUE/BLE, good ROM, no bony abnormality Psychiatric:  grossly normal mood and affect, speech fluent and appropriate but slowed and simple, AOx3 Neurologic:  CN 2-12 grossly intact, moves all extremities in coordinated fashion, sensation intact    Radiological Exams on Admission: Dg Chest 2 View  Result Date: 03/23/2018 CLINICAL DATA:  Altered mental status, current smoker EXAM: CHEST - 2 VIEW COMPARISON:  11/27/2017 FINDINGS: Emphysematous hyperinflation of the lungs. Bandlike atelectasis and/or scarring is identified at the left lung base. Heart size and mediastinal contours within normal limits with minimal aortic atherosclerosis at the arch. No pulmonary consolidation, effusion or pneumothorax. Cardiac monitoring leads project over the chest. The patient is status post ACDF of the lower cervical spine and lumbar spinal fusion. IMPRESSION: Emphysematous hyperinflation of the lungs with bandlike atelectasis and/or scarring at  the left lung base. No active pulmonary disease. Aortic atherosclerosis without aneurysm. Electronically Signed   By: Ashley Royalty M.D.   On: 03/23/2018 18:34   Ct Head Wo Contrast  Result Date: 03/23/2018 CLINICAL DATA:  Altered mental status today. EXAM: CT HEAD WITHOUT CONTRAST TECHNIQUE: Contiguous axial images were obtained from the base of the skull through the vertex without intravenous contrast. COMPARISON:  Head CT scan 11/27/2017, 02/13/2017 and 12/22/2016. FINDINGS: Brain: No evidence of acute infarction, hemorrhage, hydrocephalus, extra-axial collection or mass lesion/mass effect. Right parietal approach ventriculostomy shunt catheter is in place. No hydrocephalus. Mild periventricular hypoattenuation is unchanged. Vascular: No hyperdense vessel or unexpected calcification. Skull: Intact. Sinuses/Orbits: Negative. Other: None. IMPRESSION: No acute abnormality. Negative for hydrocephalus with a ventriculostomy shunt catheter in place. Electronically Signed   By: Inge Rise M.D.   On: 03/23/2018 18:59    EKG: Independently reviewed.  NSR with rate 98; nonspecific ST changes with no evidence of acute ischemia   Labs on Admission: I have personally reviewed the available labs and imaging studies at the time of the admission.  Pertinent labs:   BUN 11/Creatinine 1.32/GFR >60 Troponin 0 WBC 6.5 Hgb 12.2 UA: small Hgb, large LE, +nitrite, many WBC, >33 WBC ETOH and salicylate negative UDS + cocaine  Assessment/Plan Active Problems:   Acute encephalopathy   Low back pain   Diabetes mellitus without complication (HCC)   AKI (acute kidney injury) ([place])   Acute lower UTI   Tobacco dependence   Cocaine abuse ([place])   Acute encephalopathy -Patient with multiple reasons for chronic encephalopathy including mental illness, cocaine use presenting with AMS -Evaluation in the ER thorough and essentially negative other than UTI (see below) -Patient is  alert and appropriate and appears  ready for d/c  UTI   -No signs of sepsis -Appears comfortable -Denies symptoms but this is a possible source of his AMS -Recommend outpatient abx therapy  AKI -Mild -Given adequate IVF hydration while in the ER -Encourage outpatient PO intake  Tobacco dependence -Encourage cessation.  This was discussed with the patient and should be reviewed on an ongoing basis.  -Patch ordered for outpatient use; patient instructed to remove patch if he is going to smoke.  Cocaine abuse -Cessation encouraged; this should be encouraged on an ongoing basis  Back pain -Stop cocaine.   -Lidocaine patch for outpatient use.   -PCP f/u.  Other chronic medical problems -Continue home meds. -F/u with PCP.   Thank you for this consult.  TR[facility] will sign off since the patient is appropriate for ER discharge.  Please call if additional issues arise.   Karmen Bongo MD [facility]  If note is complete, please contact covering daytime or nighttime physician. [email or web address] Password [facility]  03/24/2018, 11:45 AM

## 2018-03-24 NOTE — ED Notes (Signed)
Pt discovered to have wet bed for second time. Expressed that he can feel urge to urinate, but no one was around to collect urine when it came out.

## 2018-03-24 NOTE — ED Notes (Addendum)
While getting report pt came beside of nursing station and this RN asked pt if I could help him. Pt became aggressive and stated" Why are you worried about what I need". This RN ask pt to please go into his room due to while giving report we are speaking about other patients and I would come into his room to speak with him. Pt then came right to the desk and lend over it just looking at this RN and Counsellor. Security called just for safety. And PA called to reassess pt. From my report multiple attempts have been made to collect this patients urine and has been unsuccessful. Apparently pt has "spilled" in urine, urinated on himself due to not having a urinal and used the restroom rather than a collect device even after being instructed otherwise. This is the only pending lab for patient.

## 2018-03-24 NOTE — ED Notes (Signed)
Regular diet lunch tray ordered 

## 2018-03-26 LAB — URINE CULTURE: Culture: >=100000 — AB

## 2018-03-27 ENCOUNTER — Telehealth: Payer: Self-pay

## 2018-03-27 NOTE — Telephone Encounter (Signed)
Post ED Visit - Positive Culture Follow-up  Culture report reviewed by antimicrobial stewardship pharmacist:  []  Elenor Quinones, Pharm.D. []  Heide Guile, Pharm.D., BCPS AQ-ID []  Parks Neptune, Pharm.D., BCPS []  Alycia Rossetti, Pharm.D., BCPS []  Welcome, Pharm.D., BCPS, AAHIVP []  Legrand Como, Pharm.D., BCPS, AAHIVP []  Salome Arnt, PharmD, BCPS []  Jalene Mullet, PharmD []  Vincenza Hews, PharmD, BCPS [facility] Pharm D Positive urine culture Treated with Sulfamethoxazole-Trimethoprim, organism sensitive to the same and no further patient follow-up is required at this time.  Genia Del 03/27/2018, 9:29 AM

## 2018-08-30 ENCOUNTER — Encounter (HOSPITAL_COMMUNITY): Payer: Self-pay | Admitting: General Practice

## 2018-08-30 ENCOUNTER — Inpatient Hospital Stay (HOSPITAL_COMMUNITY)
Admission: EM | Admit: 2018-08-30 | Discharge: 2018-09-08 | DRG: 917 | Disposition: A | Discharge status: Skilled Nursing Facility | Payer: Medicare Other | Attending: Internal Medicine | Admitting: Internal Medicine

## 2018-08-30 ENCOUNTER — Other Ambulatory Visit: Payer: Self-pay

## 2018-08-30 ENCOUNTER — Emergency Department (HOSPITAL_COMMUNITY): Payer: Medicare Other

## 2018-08-30 DIAGNOSIS — E1151 Type 2 diabetes mellitus with diabetic peripheral angiopathy without gangrene: Secondary | ICD-10-CM | POA: Diagnosis present

## 2018-08-30 DIAGNOSIS — Z8619 Personal history of other infectious and parasitic diseases: Secondary | ICD-10-CM

## 2018-08-30 DIAGNOSIS — M6282 Rhabdomyolysis: Secondary | ICD-10-CM | POA: Diagnosis not present

## 2018-08-30 DIAGNOSIS — F1410 Cocaine abuse, uncomplicated: Secondary | ICD-10-CM | POA: Diagnosis present

## 2018-08-30 DIAGNOSIS — E1140 Type 2 diabetes mellitus with diabetic neuropathy, unspecified: Secondary | ICD-10-CM | POA: Diagnosis present

## 2018-08-30 DIAGNOSIS — N400 Enlarged prostate without lower urinary tract symptoms: Secondary | ICD-10-CM | POA: Diagnosis present

## 2018-08-30 DIAGNOSIS — I313 Pericardial effusion (noninflammatory): Secondary | ICD-10-CM | POA: Diagnosis present

## 2018-08-30 DIAGNOSIS — Z791 Long term (current) use of non-steroidal anti-inflammatories (NSAID): Secondary | ICD-10-CM

## 2018-08-30 DIAGNOSIS — R1011 Right upper quadrant pain: Secondary | ICD-10-CM | POA: Diagnosis present

## 2018-08-30 DIAGNOSIS — J189 Pneumonia, unspecified organism: Secondary | ICD-10-CM | POA: Diagnosis present

## 2018-08-30 DIAGNOSIS — R7401 Elevation of levels of liver transaminase levels: Secondary | ICD-10-CM

## 2018-08-30 DIAGNOSIS — M549 Dorsalgia, unspecified: Secondary | ICD-10-CM | POA: Diagnosis present

## 2018-08-30 DIAGNOSIS — E119 Type 2 diabetes mellitus without complications: Secondary | ICD-10-CM | POA: Diagnosis not present

## 2018-08-30 DIAGNOSIS — G8929 Other chronic pain: Secondary | ICD-10-CM | POA: Diagnosis present

## 2018-08-30 DIAGNOSIS — Z86010 Personal history of colonic polyps: Secondary | ICD-10-CM

## 2018-08-30 DIAGNOSIS — M48061 Spinal stenosis, lumbar region without neurogenic claudication: Secondary | ICD-10-CM | POA: Diagnosis present

## 2018-08-30 DIAGNOSIS — R740 Nonspecific elevation of levels of transaminase and lactic acid dehydrogenase [LDH]: Secondary | ICD-10-CM | POA: Diagnosis present

## 2018-08-30 DIAGNOSIS — K5900 Constipation, unspecified: Secondary | ICD-10-CM | POA: Diagnosis present

## 2018-08-30 DIAGNOSIS — Z79899 Other long term (current) drug therapy: Secondary | ICD-10-CM

## 2018-08-30 DIAGNOSIS — E876 Hypokalemia: Secondary | ICD-10-CM

## 2018-08-30 DIAGNOSIS — E872 Acidosis: Secondary | ICD-10-CM | POA: Diagnosis present

## 2018-08-30 DIAGNOSIS — R000 Tachycardia, unspecified: Secondary | ICD-10-CM | POA: Diagnosis present

## 2018-08-30 DIAGNOSIS — F319 Bipolar disorder, unspecified: Secondary | ICD-10-CM | POA: Diagnosis present

## 2018-08-30 DIAGNOSIS — T405X1A Poisoning by cocaine, accidental (unintentional), initial encounter: Principal | ICD-10-CM | POA: Diagnosis present

## 2018-08-30 DIAGNOSIS — F419 Anxiety disorder, unspecified: Secondary | ICD-10-CM | POA: Diagnosis present

## 2018-08-30 DIAGNOSIS — F209 Schizophrenia, unspecified: Secondary | ICD-10-CM | POA: Diagnosis present

## 2018-08-30 DIAGNOSIS — Z880 Allergy status to penicillin: Secondary | ICD-10-CM

## 2018-08-30 DIAGNOSIS — M7918 Myalgia, other site: Secondary | ICD-10-CM | POA: Diagnosis not present

## 2018-08-30 DIAGNOSIS — Z981 Arthrodesis status: Secondary | ICD-10-CM

## 2018-08-30 DIAGNOSIS — F17200 Nicotine dependence, unspecified, uncomplicated: Secondary | ICD-10-CM | POA: Diagnosis present

## 2018-08-30 DIAGNOSIS — F17210 Nicotine dependence, cigarettes, uncomplicated: Secondary | ICD-10-CM | POA: Diagnosis present

## 2018-08-30 DIAGNOSIS — J9811 Atelectasis: Secondary | ICD-10-CM | POA: Diagnosis present

## 2018-08-30 DIAGNOSIS — Z982 Presence of cerebrospinal fluid drainage device: Secondary | ICD-10-CM

## 2018-08-30 DIAGNOSIS — Z7984 Long term (current) use of oral hypoglycemic drugs: Secondary | ICD-10-CM

## 2018-08-30 DIAGNOSIS — I340 Nonrheumatic mitral (valve) insufficiency: Secondary | ICD-10-CM | POA: Diagnosis not present

## 2018-08-30 HISTORY — DX: Rhabdomyolysis: M62.82

## 2018-08-30 HISTORY — DX: Spinal stenosis, site unspecified: M48.00

## 2018-08-30 LAB — COMPREHENSIVE METABOLIC PANEL
ALBUMIN: 4.1 g/dL (ref 3.5–5.0)
ALT: 88 U/L — ABNORMAL HIGH (ref 0–44)
ALT: 218 U/L — ABNORMAL HIGH (ref 0–44)
ANION GAP: 12 (ref 5–15)
AST: 559 U/L — AB (ref 15–41)
AST: 1419 U/L — ABNORMAL HIGH (ref 15–41)
Albumin: 3.5 g/dL (ref 3.5–5.0)
Alkaline Phosphatase: 98 U/L (ref 38–126)
Alkaline Phosphatase: 90 U/L (ref 38–126)
Anion gap: 12 (ref 5–15)
BUN: 15 mg/dL (ref 8–23)
BUN: 13 mg/dL (ref 8–23)
CHLORIDE: 102 mmol/L (ref 98–111)
CO2: 23 mmol/L (ref 22–32)
CO2: 19 mmol/L — ABNORMAL LOW (ref 22–32)
Calcium: 9.5 mg/dL (ref 8.9–10.3)
Calcium: 9 mg/dL (ref 8.9–10.3)
Chloride: 108 mmol/L (ref 98–111)
Creatinine, Ser: 1.12 mg/dL (ref 0.61–1.24)
Creatinine, Ser: 1.07 mg/dL (ref 0.61–1.24)
GFR calc Af Amer: >60 mL/min (ref >60)
GFR calc Af Amer: >60 mL/min (ref >60)
GFR calc non Af Amer: >60 mL/min (ref >60)
GFR calc non Af Amer: >60 mL/min (ref >60)
GLUCOSE: 173 mg/dL — AB (ref 70–99)
Glucose, Bld: 137 mg/dL — ABNORMAL HIGH (ref 70–99)
POTASSIUM: 3.6 mmol/L (ref 3.5–5.1)
Potassium: 3.6 mmol/L (ref 3.5–5.1)
SODIUM: 137 mmol/L (ref 135–145)
Sodium: 139 mmol/L (ref 135–145)
Total Bilirubin: 0.6 mg/dL (ref 0.3–1.2)
Total Bilirubin: 0.5 mg/dL (ref 0.3–1.2)
Total Protein: 8 g/dL (ref 6.5–8.1)
Total Protein: 7 g/dL (ref 6.5–8.1)

## 2018-08-30 LAB — CBC
HCT: 37.9 % — ABNORMAL LOW (ref 39.0–52.0)
Hemoglobin: 12.9 g/dL — ABNORMAL LOW (ref 13.0–17.0)
MCH: 32.2 pg (ref 26.0–34.0)
MCHC: 34 g/dL (ref 30.0–36.0)
MCV: 94.5 fL (ref 78.0–100.0)
Platelets: 216 10*3/uL (ref 150–400)
RBC: 4.01 MIL/uL — ABNORMAL LOW (ref 4.22–5.81)
RDW: 11.4 % — AB (ref 11.5–15.5)
WBC: 18.9 10*3/uL — AB (ref 4.0–10.5)

## 2018-08-30 LAB — CK
Total CK: >50000 U/L — ABNORMAL HIGH (ref 49–397)
Total CK: >50000 U/L — ABNORMAL HIGH (ref 49–397)

## 2018-08-30 LAB — TSH: TSH: 1.461 u[IU]/mL (ref 0.350–4.500)

## 2018-08-30 LAB — GLUCOSE, CAPILLARY
Glucose-Capillary: 140 mg/dL — ABNORMAL HIGH (ref 70–99)
Glucose-Capillary: 192 mg/dL — ABNORMAL HIGH (ref 70–99)

## 2018-08-30 LAB — SEDIMENTATION RATE: Sed Rate: 31 mm/hr — ABNORMAL HIGH (ref 0–16)

## 2018-08-30 LAB — URINALYSIS, ROUTINE W REFLEX MICROSCOPIC
BILIRUBIN URINE: NEGATIVE
KETONES UR: NEGATIVE mg/dL
LEUKOCYTES UA: NEGATIVE
NITRITE: NEGATIVE
PROTEIN: 100 mg/dL — AB
Specific Gravity, Urine: 1.013 (ref 1.005–1.030)
pH: 6 (ref 5.0–8.0)

## 2018-08-30 LAB — ETHANOL: Alcohol, Ethyl (B): <10 mg/dL (ref <10)

## 2018-08-30 LAB — C-REACTIVE PROTEIN: CRP: 4.7 mg/dL — ABNORMAL HIGH (ref <1.0)

## 2018-08-30 LAB — URIC ACID: Uric Acid, Serum: 7 mg/dL (ref 3.7–8.6)

## 2018-08-30 LAB — PHOSPHORUS: Phosphorus: 2.7 mg/dL (ref 2.5–4.6)

## 2018-08-30 MED ORDER — INFLUENZA VAC SPLIT QUAD 0.5 ML IM SUSY
0.5000 mL | PREFILLED_SYRINGE | INTRAMUSCULAR | Status: DC
  Filled 2018-08-30: qty 0.5

## 2018-08-30 MED ORDER — SODIUM CHLORIDE 0.9 % IV SOLN
INTRAVENOUS | Status: DC
  Administered 2018-08-30 – 2018-09-08 (×28): via INTRAVENOUS

## 2018-08-30 MED ORDER — ENOXAPARIN SODIUM 40 MG/0.4ML ~~LOC~~ SOLN
40.0000 mg | SUBCUTANEOUS | Status: DC
  Administered 2018-08-30 – 2018-09-08 (×8): 40 mg via SUBCUTANEOUS
  Filled 2018-08-30 (×9): qty 0.4

## 2018-08-30 MED ORDER — SODIUM CHLORIDE 0.9 % IV BOLUS
1000.0000 mL | Freq: Once | INTRAVENOUS | Status: AC
  Administered 2018-08-30: 1000 mL via INTRAVENOUS

## 2018-08-30 MED ORDER — FERROUS GLUCONATE 324 (38 FE) MG PO TABS
324.0000 mg | ORAL_TABLET | Freq: Two times a day (BID) | ORAL | Status: DC
  Administered 2018-08-30 – 2018-09-08 (×18): 324 mg via ORAL
  Filled 2018-08-30 (×19): qty 1

## 2018-08-30 MED ORDER — PALIPERIDONE ER 3 MG PO TB24
3.0000 mg | ORAL_TABLET | Freq: Every day | ORAL | Status: DC
  Administered 2018-08-30 – 2018-09-07 (×9): 3 mg via ORAL
  Filled 2018-08-30 (×10): qty 1

## 2018-08-30 MED ORDER — INSULIN ASPART 100 UNIT/ML ~~LOC~~ SOLN: 0.0000 [IU] | Freq: Every day | SUBCUTANEOUS | Status: DC

## 2018-08-30 MED ORDER — TRAZODONE HCL 50 MG PO TABS
75.0000 mg | ORAL_TABLET | Freq: Every day | ORAL | Status: DC
  Administered 2018-08-30 – 2018-09-07 (×9): 75 mg via ORAL
  Filled 2018-08-30 (×9): qty 2

## 2018-08-30 MED ORDER — SODIUM CHLORIDE 0.9 % IV SOLN: Freq: Once | INTRAVENOUS | Status: DC

## 2018-08-30 MED ORDER — INSULIN ASPART 100 UNIT/ML ~~LOC~~ SOLN
0.0000 [IU] | Freq: Three times a day (TID) | SUBCUTANEOUS | Status: DC
  Administered 2018-08-30: 1 [IU] via SUBCUTANEOUS
  Administered 2018-08-31 (×3): 2 [IU] via SUBCUTANEOUS
  Administered 2018-09-01 – 2018-09-03 (×7): 1 [IU] via SUBCUTANEOUS
  Administered 2018-09-03: 2 [IU] via SUBCUTANEOUS
  Administered 2018-09-03 – 2018-09-04 (×2): 1 [IU] via SUBCUTANEOUS
  Administered 2018-09-04: 2 [IU] via SUBCUTANEOUS
  Administered 2018-09-04: 1 [IU] via SUBCUTANEOUS
  Administered 2018-09-05: 2 [IU] via SUBCUTANEOUS
  Administered 2018-09-05 – 2018-09-07 (×6): 1 [IU] via SUBCUTANEOUS
  Administered 2018-09-07: 2 [IU] via SUBCUTANEOUS
  Administered 2018-09-08 (×3): 1 [IU] via SUBCUTANEOUS

## 2018-08-30 MED ORDER — TAMSULOSIN HCL 0.4 MG PO CAPS
0.4000 mg | ORAL_CAPSULE | Freq: Every day | ORAL | Status: DC
  Administered 2018-08-30 – 2018-09-08 (×10): 0.4 mg via ORAL
  Filled 2018-08-30 (×10): qty 1

## 2018-08-30 MED ORDER — SERTRALINE HCL 100 MG PO TABS
100.0000 mg | ORAL_TABLET | Freq: Every day | ORAL | Status: DC
  Administered 2018-08-30 – 2018-09-07 (×9): 100 mg via ORAL
  Filled 2018-08-30 (×9): qty 1

## 2018-08-30 MED ORDER — FINASTERIDE 5 MG PO TABS
5.0000 mg | ORAL_TABLET | Freq: Every day | ORAL | Status: DC
  Administered 2018-08-30 – 2018-09-08 (×10): 5 mg via ORAL
  Filled 2018-08-30 (×10): qty 1

## 2018-08-30 MED ORDER — GABAPENTIN 300 MG PO CAPS
300.0000 mg | ORAL_CAPSULE | Freq: Every day | ORAL | Status: DC
  Administered 2018-08-30 – 2018-09-07 (×9): 300 mg via ORAL
  Filled 2018-08-30 (×9): qty 1

## 2018-08-30 MED ORDER — VITAMIN D 1000 UNITS PO TABS
1000.0000 [IU] | ORAL_TABLET | Freq: Every day | ORAL | Status: DC
  Administered 2018-08-30 – 2018-09-08 (×10): 1000 [IU] via ORAL
  Filled 2018-08-30 (×11): qty 1

## 2018-08-30 MED ORDER — SODIUM CHLORIDE 0.9 % IV SOLN
2.0000 g | Freq: Once | INTRAVENOUS | Status: AC
  Administered 2018-08-30: 2 g via INTRAVENOUS
  Filled 2018-08-30: qty 20

## 2018-08-30 NOTE — ED Notes (Signed)
Spoke with Deidre Ala from Dr. Windy Carina neuro [phone] to inform that patient will be inpatient and will need to be evaluated by Dr. Saintclair Halsted

## 2018-08-30 NOTE — ED Notes (Signed)
Pt back from MRI at this time

## 2018-08-30 NOTE — Progress Notes (Signed)
Contacted Dr Saintclair Halsted regarding patients VP shunt. Dr Saintclair Halsted wants patient to come to the office on Thursday to check shunt post MRI. If patient is admitted, will need to contact Dr Windy Carina office to coordinate them checking shunt while inpatient. Shunt will need to be checked post MRI. Informed the patient of this. Will also inform RN.

## 2018-08-30 NOTE — H&P (Addendum)
History and Physical    [name] NKN:[id number] DOB: 1956-04-07 DOA: 08/30/2018  PCP: Nolene Ebbs, MD Consultants:  none Patient coming from: home- lives alone  Chief Complaint: muscle pain and weakness  HPI: [name] is a 62 y.o. male with medical history significant for schizophrenia, PVD, spinal stenosis, DM who presented to the ED today with c/o bilateral leg pain. One day ago he woke up and, due to pain and weakness of bilateral thighs was unable to ambulate. He states that the day prior he spent most of the day lying around; in fact he states most days he just lies on the couch and watches TV because he lives alone and "there's nothing else to do." He has been on no new medications, has had no f/c/ns, cough, headache, neck pain, chest pain, SOB, abd pain, N/V/D, rash. Although he has noticed a small pinprick bite-like mark on the inside of his L thigh. He has not had any sick contacts, no recent travel and no known insect bites. He denies alcohol / drug use. He does smoke about 1/3 ppd.   ED Course:  He was tachycardic, BP nL Poor effort on neuro exam but weak bilaterally CK >50,000 Lumbar spine MRI was unremarkable  Review of Systems: As per HPI; otherwise review of systems reviewed and negative.   Ambulatory Status:  Ambulates without assistance until today  Past Medical History:  Diagnosis Date  . Anxiety   . Bipolar disorder ([place])   . Chronic back pain   . Depression   . Diabetes mellitus without complication ([place])   . Diabetic neuropathy ([place])   . DVT (deep venous thrombosis) ([place])   . Enlarged prostate   . Headache   . Hepatitis    Hepatitis C - has been treated with Harvoni (cleared in July, 2017)  . History of colon polyps   . Peripheral vascular disease ([place])   . Pneumonia    hx of-about 33yrs ago  . Schizophrenia [facility])     Past Surgical History:  Procedure Laterality Date  . APPENDECTOMY     early 83's  . BACK SURGERY  05/2011   x 5  .  blood clot in groin  early 80's  . CARPAL TUNNEL RELEASE     left  . CERVICAL SPINE SURGERY  2009  . COLONOSCOPY    . HERNIA REPAIR     early 2000's  . SHOULDER SURGERY  70's   right  . VENTRICULOPERITONEAL SHUNT N/A 10/30/2016   Procedure: Right Ventricular Peritoneal Shunt;  Surgeon: Kary Kos, MD;  Location: [facility];  Service: Neurosurgery;  Laterality: N/A;    Social History   Socioeconomic History  . Marital status: Single    Spouse name: Not on file  . Number of children: Not on file  . Years of education: Not on file  . Highest education level: Not on file  Occupational History  . Occupation: disabled  Social Needs  . Financial resource strain: Not on file  . Food insecurity:    Worry: Not on file    Inability: Not on file  . Transportation needs:    Medical: Not on file    Non-medical: Not on file  Tobacco Use  . Smoking status: Current Every Day Smoker    Packs/day: 1.00    Years: 40.00    Pack years: 40.00    Types: Cigarettes  . Smokeless tobacco: Never Used  Substance and Sexual Activity  . Alcohol use: Yes  Comment: occasional use  . Drug use: Yes    Types: Cocaine, Marijuana  . Sexual activity: Yes  Lifestyle  . Physical activity:    Days per week: Not on file    Minutes per session: Not on file  . Stress: Not on file  Relationships  . Social connections:    Talks on phone: Not on file    Gets together: Not on file    Attends religious service: Not on file    Active member of club or organization: Not on file    Attends meetings of clubs or organizations: Not on file    Relationship status: Not on file  . Intimate partner violence:    Fear of current or ex partner: Not on file    Emotionally abused: Not on file    Physically abused: Not on file    Forced sexual activity: Not on file  Other Topics Concern  . Not on file  Social History Narrative  . Not on file    Allergies  Allergen Reactions  . Penicillins Hives and Other (See  Comments)    Has patient had a PCN reaction causing immediate rash, facial/tongue/throat swelling, SOB or lightheadedness with hypotension: No Has patient had a PCN reaction causing severe rash involving mucus membranes or skin necrosis: No Has patient had a PCN reaction that required hospitalization No Has patient had a PCN reaction occurring within the last 10 years: No If all of the above answers are "NO", then may proceed with Cephalosporin use.    Family History  Problem Relation Age of Onset  . Heart attack Father   . Anesthesia problems Neg Hx   . Hypotension Neg Hx   . Malignant hyperthermia Neg Hx   . Pseudochol deficiency Neg Hx     Prior to Admission medications   Medication Sig Start Date End Date Taking? Authorizing Provider  celecoxib (CELEBREX) 200 MG capsule Take 1 capsule (200 mg total) by mouth daily. 06/29/17  Yes [name], MD  cholecalciferol (VITAMIN D) 1000 units tablet Take 1,000 Units by mouth daily.   Yes [provider]  ferrous gluconate (FERGON) 324 MG tablet Take 1 tablet (324 mg total) by mouth 2 (two) times daily with a meal. 06/30/17  Yes [name], MD  finasteride (PROSCAR) 5 MG tablet Take 5 mg by mouth daily.  12/09/17  Yes [provider]  gabapentin (NEURONTIN) 300 MG capsule Take 1 capsule (300 mg total) by mouth at bedtime. 06/29/17  Yes [name], MD  ibuprofen (ADVIL,MOTRIN) 600 MG tablet Take 600 mg by mouth daily.  05/27/17  Yes [provider]  INVEGA SUSTENNA 234 MG/1.5ML SUSP injection Inject 234 mg into the muscle every 30 (thirty) days.  12/08/17  Yes [provider]  lidocaine (LIDODERM) 5 % Place 1 patch onto the skin daily. Remove & Discard patch within 12 hours or as directed by MD 03/24/18  Yes Maczis, Barth Kirks, PA-C  metFORMIN (GLUCOPHAGE) 500 MG tablet Take 500 mg by mouth daily with breakfast.    Yes [provider]  nicotine (NICODERM CQ - DOSED IN MG/24 HR) 7 mg/24hr patch Place  1 patch (7 mg total) onto the skin daily. 03/24/18  Yes Maczis, Barth Kirks, PA-C  paliperidone (INVEGA) 3 MG 24 hr tablet Take 1 tablet (3 mg total) by mouth at bedtime. 06/29/17  Yes [name], MD  sertraline (ZOLOFT) 100 MG tablet Take 100 mg by mouth at bedtime.  Yes [provider]  sitaGLIPtin (JANUVIA) 100 MG tablet Take 100 mg by mouth daily with breakfast.   Yes [provider]  tamsulosin (FLOMAX) 0.4 MG CAPS capsule Take 0.4 mg by mouth daily after supper.    Yes [provider]  traZODone (DESYREL) 150 MG tablet Take 0.5 tablets (75 mg total) by mouth at bedtime. 11/29/17  Yes Patrecia Pour, MD    Physical Exam: Vitals:   08/30/18 0842 08/30/18 0844 08/30/18 1015 08/30/18 1123  BP:  (!) 161/84 (!) 168/83 (!) 144/69  Pulse:  (!) 114 (!) 112 (!) 110  Resp:  18 19 18   Temp:  99.2 F (37.3 C)    TempSrc:  Oral    SpO2:  99% 98% 99%  Weight: 85.3 kg     Height: 6' (1.829 m)        . General:  Appears calm and comfortable and is in NAD . Eyes:  PERRL, EOMI, normal lids, iris . ENT:  grossly normal hearing, lips & tongue, mmm; appropriate dentition . Neck:  no LAD, masses or thyromegaly; no carotid bruits . Cardiovascular:  normal rate, reg rhythm, no murmur. [name] Respiratory:   CTA bilaterally with no wheezes/rales/rhonchi.  Normal respiratory effort. . Abdomen:  soft, NT, ND, NABS . Back:   grossly normal alignment, no CVAT . Skin: small pinprick area that looks like an insect bite on medial L thigh with minimal erythema, no exudate, no induration; otherwise no rash seen on limited exam . Musculoskeletal: he is tender to palpation over his bilateral thighs; grossly normal tone BUE/BLE, good ROM, no bony abnormality or obvious joint deformity . Lower extremity:  No LE edema.  Limited foot exam with no ulcerations.  2+ distal pulses. [name] Psychiatric:  grossly normal mood and affect, speech fluent and appropriate, AOx3 . Neurologic:  CN 2-12 grossly  intact, moves all extremities in coordinated fashion, sensation intact. He has weakness with hip flexion and knee extension/flexion bilaterally; nonfocal    Radiological Exams on Admission: Mr Lumbar Spine Wo Contrast  Result Date: 08/30/2018 CLINICAL DATA:  Bilateral thigh pain beginning yesterday. Lower extremity weakness. Prior lumbar fusion. EXAM: MRI LUMBAR SPINE WITHOUT CONTRAST TECHNIQUE: Multiplanar, multisequence MR imaging of the lumbar spine was performed. No intravenous contrast was administered. COMPARISON:  06/25/2017 FINDINGS: Segmentation:  Standard. Alignment:  New anterolisthesis of L5 on S1 measuring 3 mm. Vertebrae: Interval L5-S1 posterior and interbody fusion. Prior L2-L5 posterior fusion and L4-5 interbody fusion. No fracture, suspicious osseous lesion, or significant marrow edema. Conus medullaris and cauda equina: Conus extends to the L1 level. Conus and cauda equina appear normal. Paraspinal and other soft tissues: Postoperative changes in the posterior lumbar soft tissues with small posterior fluid collections visible at L3-4 and L5-S1 measuring less than 1 cm in AP thickness. Unchanged 11 mm T2 hyperintense lesion posteriorly in the left kidney, likely a cyst. Disc levels: T12-L1: Mild facet arthrosis without disc herniation or stenosis. L1-2: Mild facet arthrosis without disc herniation or stenosis, unchanged. L2-3: Prior fusion.  No stenosis. L3-4: Prior posterior decompression and fusion. Unchanged mild foraminal endplate spurring without significant stenosis. L4-5: Prior posterior decompression and fusion. No significant stenosis. L5-S1: Interval posterior decompression and fusion. No significant residual spinal or lateral recess stenosis. Limited assessment of the neural foramina due to susceptibility artifact with possibly mild right neural foraminal stenosis. The right S1 screw is more inferiorly located than that on the left and courses medial to the pedicle potentially a  pinching  upon the right S1 nerve root. IMPRESSION: 1. Interval L5-S1 fusion without residual spinal stenosis. Inferomedial course of the right S1 screw potentially affecting the right S1 nerve root. 2. Unchanged appearance of L2-L5 fusion. Electronically Signed   By: Logan Bores M.D.   On: 08/30/2018 14:47    EKG: not done   Labs on Admission: I have personally reviewed the available labs and imaging studies at the time of the admission.  Pertinent labs:  BMP unremarkable Creat 1.12 (improved from 02/2018 when it was 1.32) K 3.6 AST 559 ALT 88  CK >50,000  WBC 18.9 Hgb 12.9 Plt 216   Assessment/Plan Principal Problem:   Rhabdomyolysis Active Problems:   History of lumbar spinal fusion   Diabetes mellitus without complication ([place])   Spinal stenosis of lumbar region   Tobacco dependence  Schizophrenia  Rhabdomyolysis Unclear cause, other than the fact that pt states he lies around every day. Although he denies EtOH / drug use, cocaine abuse is listed on his problem list. Possible this is drug-related. He is not on a statin or other drug typically associated with rhabdo. ?Insect bite on his L thigh could also be contributing but less likely. -Bolus NS 1000 x 1, then 300 cc/hour -check phosphorous -check EtOH, UDS -check uric acid -check TSH -q12h CMP -careful monitoring of lytes, Ca, P, uric acid, CK -modify IVF as needed; watch for s/s volume overload -strict I/O  DM, noninsulin dependent -carb modified diet -Hold home metformin -SSI -gabapentin home dose for neuropathy  Schizophrenia -cont home paliperidone -cont home trazodone  BPH -home proscar, flomax  H/o lumbar spinal fusion 2/2 spinal stenosis -MRI here shows no residual stenosis  Tobacco Use -counseling -nicotine patch if needed   DVT prophylaxis:  Lovenox or SCDs Code Status:  Full - confirmed with patient/family Family Communication: none  Disposition Plan:  Home once clinically  improved Consults called: none  Admission status: Admit - It is my clinical opinion that admission to INPATIENT is reasonable and necessary because of the expectation that this patient will require hospital care that crosses at least 2 midnights to treat this condition based on the medical complexity of the problems presented.  Given the aforementioned information, the predictability of an adverse outcome is felt to be significant.     Janora Norlander MD [facility]  If note is complete, please contact covering daytime or nighttime physician. [email or web address] Password [email or web address]  08/30/2018, 2:01 PM

## 2018-08-30 NOTE — ED Triage Notes (Signed)
Pt BIBA for c/o bilateral thigh that began last night ; pt denies any injury to area or fall  ; pt states he was unable to get out of bed this morning; pt alert and oriented x4

## 2018-08-30 NOTE — Progress Notes (Signed)
[name] is a 62 y.o. male patient admitted from ED awake, alert - oriented  X 4 - no acute distress noted.  VSS - Blood pressure (!) 145/70, pulse (!) 102, temperature 99.3 F (37.4 C), temperature source Oral, resp. rate 20, height 6' (1.829 m), weight 82.9 kg, SpO2 100 %.    IV in place, occlusive dsg intact without redness.  Orientation to room, and floor completed with information packet given to patient/family.  Patient declined safety video at this time.  Admission INP armband ID verified with patient/family, and in place.   SR up x 2, fall assessment complete, with patient and family able to verbalize understanding of risk associated with falls, and verbalized understanding to call nsg before up out of bed.  Call light within reach, patient able to voice, and demonstrate understanding.  Skin, clean-dry- intact without evidence of bruising, or skin tears.   No evidence of skin break down noted on exam.     Will cont to eval and treat per MD orders.  Luci Bank, RN 08/30/2018 6:30 PM

## 2018-08-30 NOTE — ED Notes (Signed)
Pt aware of the need of a UA sample 

## 2018-08-30 NOTE — ED Provider Notes (Signed)
[facility] Emergency Department Provider Note MRN:  [id number]  Arrival date & time: 08/30/18     Chief Complaint   Leg Pain   History of Present Illness   [name] is a 62 y.o. year-old male with a history of schizophrenia, peripheral vascular disease, spinal stenosis, diabetes presenting to the ED with chief complaint of leg pain.  Patient explains that yesterday morning, he woke up and was unable to walk.  Unable to walk due to pain in the bilateral thighs.  Also endorsing weakness of the bilateral thighs.  Patient lives alone and has been unable to care for himself, normally walks around with a cane.  Patient denies recent fever, no headache or vision change, no neck pain, no chest pain or shortness of breath, no abdominal pain.  No recent falls or trauma.  Denies any bowel or bladder dysfunction.  No back pain.  Review of Systems  A complete 10 system review of systems was obtained and all systems are negative except as noted in the HPI and PMH.   Patient's Health History    Past Medical History:  Diagnosis Date  . Anxiety   . Bipolar disorder ([place])   . Chronic back pain   . Depression   . Diabetes mellitus without complication ([place])   . Diabetic neuropathy ([place])   . DVT (deep venous thrombosis) ([place])   . Enlarged prostate   . Headache   . Hepatitis    Hepatitis C - has been treated with Harvoni (cleared in July, 2017)  . History of colon polyps   . Peripheral vascular disease ([place])   . Pneumonia    hx of-about 57yrs ago  . Schizophrenia [facility])     Past Surgical History:  Procedure Laterality Date  . APPENDECTOMY     early 72's  . BACK SURGERY  05/2011   x 5  . blood clot in groin  early 80's  . CARPAL TUNNEL RELEASE     left  . CERVICAL SPINE SURGERY  2009  . COLONOSCOPY    . HERNIA REPAIR     early 2000's  . SHOULDER SURGERY  70's   right  . VENTRICULOPERITONEAL SHUNT N/A 10/30/2016   Procedure: Right Ventricular Peritoneal Shunt;   Surgeon: Kary Kos, MD;  Location: [facility];  Service: Neurosurgery;  Laterality: N/A;    Family History  Problem Relation Age of Onset  . Heart attack Father   . Anesthesia problems Neg Hx   . Hypotension Neg Hx   . Malignant hyperthermia Neg Hx   . Pseudochol deficiency Neg Hx     Social History   Socioeconomic History  . Marital status: Single    Spouse name: Not on file  . Number of children: Not on file  . Years of education: Not on file  . Highest education level: Not on file  Occupational History  . Occupation: disabled  Social Needs  . Financial resource strain: Not on file  . Food insecurity:    Worry: Not on file    Inability: Not on file  . Transportation needs:    Medical: Not on file    Non-medical: Not on file  Tobacco Use  . Smoking status: Current Every Day Smoker    Packs/day: 1.00    Years: 40.00    Pack years: 40.00    Types: Cigarettes  . Smokeless tobacco: Never Used  Substance and Sexual Activity  . Alcohol use: Yes    Comment: occasional  use  . Drug use: Yes    Types: Cocaine, Marijuana  . Sexual activity: Yes  Lifestyle  . Physical activity:    Days per week: Not on file    Minutes per session: Not on file  . Stress: Not on file  Relationships  . Social connections:    Talks on phone: Not on file    Gets together: Not on file    Attends religious service: Not on file    Active member of club or organization: Not on file    Attends meetings of clubs or organizations: Not on file    Relationship status: Not on file  . Intimate partner violence:    Fear of current or ex partner: Not on file    Emotionally abused: Not on file    Physically abused: Not on file    Forced sexual activity: Not on file  Other Topics Concern  . Not on file  Social History Narrative  . Not on file     Physical Exam  Vital Signs and Nursing Notes reviewed Vitals:   08/30/18 1015 08/30/18 1123  BP: (!) 168/83 (!) 144/69  Pulse: (!) 112 (!) 110  Resp: 19  18  Temp:    SpO2: 98% 99%    CONSTITUTIONAL: Chronically ill-appearing, NAD NEURO:  Alert and oriented x 3, symmetric mild decreased strength to the bilateral lower extremities EYES:  eyes equal and reactive ENT/NECK:  no LAD, no JVD CARDIO: Tachycardic rate, well-perfused, normal S1 and S2 PULM:  CTAB no wheezing or rhonchi GI/GU:  normal bowel sounds, non-distended, non-tender MSK/SPINE:  No gross deformities, no edema; minimal tenderness to palpation of the bilateral quadriceps SKIN:  no rash, atraumatic PSYCH:  Appropriate speech and behavior  Diagnostic and Interventional Summary    EKG Interpretation  Date/Time:    Ventricular Rate:    PR Interval:    QRS Duration:   QT Interval:    QTC Calculation:   R Axis:     Text Interpretation:        Labs Reviewed  CBC - Abnormal; Notable for the following components:      Result Value   WBC 18.9 (*)    RBC 4.01 (*)    Hemoglobin 12.9 (*)    HCT 37.9 (*)    RDW 11.4 (*)    All other components within normal limits  COMPREHENSIVE METABOLIC PANEL - Abnormal; Notable for the following components:   Glucose, Bld 173 (*)    AST 559 (*)    ALT 88 (*)    All other components within normal limits  C-REACTIVE PROTEIN - Abnormal; Notable for the following components:   CRP 4.7 (*)    All other components within normal limits  SEDIMENTATION RATE - Abnormal; Notable for the following components:   Sed Rate 31 (*)    All other components within normal limits  CK - Abnormal; Notable for the following components:   Total CK >50,000 (*)    All other components within normal limits  URINALYSIS, ROUTINE W REFLEX MICROSCOPIC - Abnormal; Notable for the following components:   Color, Urine AMBER (*)    APPearance CLOUDY (*)    Glucose, UA >=500 (*)    Hgb urine dipstick LARGE (*)    Protein, ur 100 (*)    WBC, UA >50 (*)    Bacteria, UA MANY (*)    All other components within normal limits  URINE CULTURE    MR LUMBAR SPINE WO  CONTRAST    (Results Pending)    Medications  sodium chloride 0.9 % bolus 1,000 mL (has no administration in time range)  0.9 %  sodium chloride infusion (has no administration in time range)  paliperidone (INVEGA) 24 hr tablet 3 mg (has no administration in time range)  sertraline (ZOLOFT) tablet 100 mg (has no administration in time range)  traZODone (DESYREL) tablet 75 mg (has no administration in time range)  finasteride (PROSCAR) tablet 5 mg (has no administration in time range)  tamsulosin (FLOMAX) capsule 0.4 mg (has no administration in time range)  ferrous gluconate (FERGON) tablet 324 mg (has no administration in time range)  gabapentin (NEURONTIN) capsule 300 mg (has no administration in time range)  cholecalciferol (VITAMIN D) tablet 1,000 Units (has no administration in time range)  sodium chloride 0.9 % bolus 1,000 mL (0 mLs Intravenous Stopped 08/30/18 1125)  cefTRIAXone (ROCEPHIN) 2 g in sodium chloride 0.9 % 100 mL IVPB (0 g Intravenous Stopped 08/30/18 1233)     Procedures Critical Care  ED Course and Medical Decision Making  I have reviewed the triage vital signs and the nursing notes.  Pertinent labs & imaging results that were available during my care of the patient were reviewed by me and considered in my medical decision making (see below for details).  Question of inflammatory condition causing proximal muscle pain and weakness, will screen with CK.  However given patient's history of spinal stenosis, will need to exclude myelopathy with MRI.  Lower extremities neurovascularly intact, peripheral artery disease does not appear to be the culprit today.  Labs reveal significant CK elevation consistent with rhabdomyolysis.  MRI still pending.  Accepted for admission by hospitalist service.  Given bolus IV fluids in the ED, normal saline infusion started as well.  Barth Kirks. Sedonia Small, MD [place] [email or web address]  Final Clinical Impressions(s) / ED Diagnoses     ICD-10-CM   1. Non-traumatic rhabdomyolysis M62.82     ED Discharge Orders    None         Maudie Flakes, MD 08/30/18 1430

## 2018-08-31 LAB — PHOSPHORUS: Phosphorus: 2.7 mg/dL (ref 2.5–4.6)

## 2018-08-31 LAB — RAPID URINE DRUG SCREEN, HOSP PERFORMED
Amphetamines: NOT DETECTED
Barbiturates: NOT DETECTED
Benzodiazepines: NOT DETECTED
Cocaine: POSITIVE — AB
Opiates: NOT DETECTED
Tetrahydrocannabinol: NOT DETECTED

## 2018-08-31 LAB — CBC
HCT: 39.1 % (ref 39.0–52.0)
Hemoglobin: 13.2 g/dL (ref 13.0–17.0)
MCH: 32.3 pg (ref 26.0–34.0)
MCHC: 33.8 g/dL (ref 30.0–36.0)
MCV: 95.6 fL (ref 78.0–100.0)
Platelets: 194 10*3/uL (ref 150–400)
RBC: 4.09 MIL/uL — ABNORMAL LOW (ref 4.22–5.81)
RDW: 11.3 % — ABNORMAL LOW (ref 11.5–15.5)
WBC: 13.6 10*3/uL — ABNORMAL HIGH (ref 4.0–10.5)

## 2018-08-31 LAB — COMPREHENSIVE METABOLIC PANEL
ALT: 264 U/L — ABNORMAL HIGH (ref 0–44)
AST: 1392 U/L — ABNORMAL HIGH (ref 15–41)
Albumin: 3.2 g/dL — ABNORMAL LOW (ref 3.5–5.0)
Alkaline Phosphatase: 80 U/L (ref 38–126)
Anion gap: 11 (ref 5–15)
BUN: 10 mg/dL (ref 8–23)
CO2: 20 mmol/L — ABNORMAL LOW (ref 22–32)
Calcium: 8.8 mg/dL — ABNORMAL LOW (ref 8.9–10.3)
Chloride: 109 mmol/L (ref 98–111)
Creatinine, Ser: 1.1 mg/dL (ref 0.61–1.24)
GFR calc Af Amer: >60 mL/min (ref >60)
GFR calc non Af Amer: >60 mL/min (ref >60)
Glucose, Bld: 154 mg/dL — ABNORMAL HIGH (ref 70–99)
Potassium: 3.9 mmol/L (ref 3.5–5.1)
Sodium: 140 mmol/L (ref 135–145)
Total Bilirubin: 0.6 mg/dL (ref 0.3–1.2)
Total Protein: 6.8 g/dL (ref 6.5–8.1)

## 2018-08-31 LAB — GLUCOSE, CAPILLARY
Glucose-Capillary: 200 mg/dL — ABNORMAL HIGH (ref 70–99)
Glucose-Capillary: 175 mg/dL — ABNORMAL HIGH (ref 70–99)
Glucose-Capillary: 160 mg/dL — ABNORMAL HIGH (ref 70–99)
Glucose-Capillary: 163 mg/dL — ABNORMAL HIGH (ref 70–99)

## 2018-08-31 LAB — URINE CULTURE: Culture: NO GROWTH

## 2018-08-31 LAB — CK: Total CK: >50000 U/L — ABNORMAL HIGH (ref 49–397)

## 2018-08-31 MED ORDER — NICOTINE 21 MG/24HR TD PT24
21.0000 mg | MEDICATED_PATCH | Freq: Every day | TRANSDERMAL | Status: DC
  Administered 2018-08-31 – 2018-09-08 (×9): 21 mg via TRANSDERMAL
  Filled 2018-08-31 (×9): qty 1

## 2018-08-31 MED ORDER — OXYCODONE HCL 5 MG PO TABS
5.0000 mg | ORAL_TABLET | ORAL | Status: DC | PRN
  Administered 2018-09-04 – 2018-09-05 (×2): 5 mg via ORAL
  Filled 2018-08-31 (×2): qty 1

## 2018-08-31 NOTE — Progress Notes (Signed)
Pt BP 165/78. Schorr, NP notified. Pt in no distress.

## 2018-08-31 NOTE — Evaluation (Addendum)
Physical Therapy Evaluation Patient Details Name: [name] MRN: [id number] DOB: 05-06-1956 Today's Date: 08/31/2018   History of Present Illness  Pt is a 62 y.o. male admitted 08/30/18 with BLE pain, weakness, and inability to walk; worke up for rhabdomyolysis of unclear cause. PMH includes schizophrenia, PVD, spinal stenosis, DM, L5-S1 PLIF (05/2018).    Clinical Impression  Pt presents with an overall decrease in functional mobility secondary to above. PTA, pt lives alone; providing inconsistent details regarding PLOF, reports he can walk alone, but also reporting he has been using friend's wheelchair to get around. Today, pt required maxA to stand and attempt steps from bed to recliner; limited by generalized weakness, BLE pain, decreased awareness, and poor problem solving. Discussed recommendation for SNF-level therapies to maximize functional mobility and independence prior to return home, pt in agreement with this. Will follow acutely to address established goals.     Follow Up Recommendations SNF;Supervision for mobility/OOB    Equipment Recommendations  Wheelchair (measurements PT)    Recommendations for Other Services       Precautions / Restrictions Precautions Precautions: Fall Restrictions Weight Bearing Restrictions: No      Mobility  Bed Mobility Overal bed mobility: Needs Assistance Bed Mobility: Supine to Sit     Supine to sit: Max assist;HOB elevated     General bed mobility comments: Max encouragement for pt to attempt mobility himself versus asking for help throughout. Required maxA to assist painful BLEs to EOB and assist trunk elevation  Transfers Overall transfer level: Needs assistance Equipment used: 1 person hand held assist Transfers: Sit to/from Stand Sit to Stand: Max assist;From elevated surface         General transfer comment: Reliant on maxA to assist trunk elevation and BUE support; max cues for bilat hip ext to achieve fully  upright posture  Ambulation/Gait Ambulation/Gait assistance: Max assist Gait Distance (Feet): 1 Feet Assistive device: 1 person hand held assist Gait Pattern/deviations: Step-to pattern   Gait velocity interpretation: <1.31 ft/sec, indicative of household ambulator General Gait Details: Took ~10 pivotal "steps" from bed to recliner with maxA and bilat HHA; pt unable to pick either foot completely off floor, but able to scoot feet with maxA and repeated cues for sequencing/weight shifts  Stairs            Wheelchair Mobility    Modified Rankin (Stroke Patients Only)       Balance Overall balance assessment: Needs assistance   Sitting balance-Leahy Scale: Fair Sitting balance - Comments: Poor trunk control requiring intermittent modA to correct R-lateral lean; pt blaming this on bed Postural control: Right lateral lean   Standing balance-Leahy Scale: Zero                               Pertinent Vitals/Pain Pain Assessment: Faces Faces Pain Scale: Hurts little more Pain Location: BLEs Pain Descriptors / Indicators: Sore;Guarding;Grimacing;Moaning Pain Intervention(s): Monitored during session;Limited activity within patient's tolerance;Repositioned    Home Living Family/patient expects to be discharged to:: Skilled nursing facility Living Arrangements: Alone                    Prior Function Level of Independence: Independent with assistive device(s)         Comments: Pt providing inconsistent details regarding PLOF. Reports he has been using a friend's w/c to get around, but then stating "I dont understand, I was just up walking this morning"  Hand Dominance        Extremity/Trunk Assessment   Upper Extremity Assessment Upper Extremity Assessment: Generalized weakness    Lower Extremity Assessment Lower Extremity Assessment: Generalized weakness    Cervical / Trunk Assessment Cervical / Trunk Assessment: Kyphotic   Communication   Communication: (soft-spoken)  Cognition Arousal/Alertness: Awake/alert Behavior During Therapy: Flat affect Overall Cognitive Status: No family/caregiver present to determine baseline cognitive functioning Area of Impairment: Orientation;Attention;Memory;Following commands;Safety/judgement;Awareness;Problem solving                 Orientation Level: Disoriented to;Situation Current Attention Level: Selective Memory: Decreased short-term memory Following Commands: Follows one step commands with increased time Safety/Judgement: Decreased awareness of safety;Decreased awareness of deficits Awareness: Emergent Problem Solving: Slow processing;Difficulty sequencing;Requires verbal cues;Requires tactile cues General Comments: Pt poor historian and providing inconsistent details regarding PLOF. Required maxA to stand pivot to chair, but reporting "I don't understand, I was just walking in here this morning" despite attempts to discuss unliklihood of that, pt adamant he was walking this morning      General Comments      Exercises     Assessment/Plan    PT Assessment Patient needs continued PT services  PT Problem List Decreased strength;Decreased activity tolerance;Decreased balance;Decreased mobility;Decreased cognition;Decreased knowledge of use of DME;Decreased safety awareness       PT Treatment Interventions DME instruction;Gait training;Stair training;Functional mobility training;Therapeutic activities;Therapeutic exercise;Balance training;Cognitive remediation;Patient/family education;Wheelchair mobility training    PT Goals (Current goals can be found in the Care Plan section)  Acute Rehab PT Goals Patient Stated Goal: Get a wheelchair to use at home PT Goal Formulation: With patient Time For Goal Achievement: 09/14/18 Potential to Achieve Goals: Fair    Frequency Min 2X/week   Barriers to discharge Decreased caregiver support      Co-evaluation                AM-PAC PT "6 Clicks" Daily Activity  Outcome Measure Difficulty turning over in bed (including adjusting bedclothes, sheets and blankets)?: Unable Difficulty moving from lying on back to sitting on the side of the bed? : Unable Difficulty sitting down on and standing up from a chair with arms (e.g., wheelchair, bedside commode, etc,.)?: Unable Help needed moving to and from a bed to chair (including a wheelchair)?: A Lot Help needed walking in hospital room?: Total Help needed climbing 3-5 steps with a railing? : Total 6 Click Score: 7    End of Session Equipment Utilized During Treatment: Gait belt Activity Tolerance: Patient limited by fatigue;Patient limited by pain Patient left: in chair;with call bell/phone within reach;with chair alarm set Nurse Communication: Mobility status PT Visit Diagnosis: Other abnormalities of gait and mobility (R26.89);Muscle weakness (generalized) (M62.81)    Time: 6644-0347 PT Time Calculation (min) (ACUTE ONLY): 25 min   Charges:   PT Evaluation $PT Eval Moderate Complexity: 1 Mod PT Treatments $Therapeutic Activity: 8-22 mins       Mabeline Caras, PT, DPT Acute Rehabilitation Services  Pager [phone] Office [phone]  Derry Lory 08/31/2018, 12:44 PM

## 2018-08-31 NOTE — Progress Notes (Signed)
PROGRESS NOTE [facility]   [name]   RDE:[id number] DOB: 01/31/1956  DOA: 08/30/2018 PCP: Nolene Ebbs, MD   Brief Narrative:  [name] is a 62 year old male with PMH of schizophrenia, PVD, DM, and spinal stenosis. Patient presented to ED from home via EMS yesterday for acute onset, severe bilateral leg pain and weakness preventing him from being able to ambulate independently. Labs in the ED revealed CK > 50,000 and patient was admitted to inpatient for non-traumatic rhabdomyolysis.    Subjective: Patient continues to complain of bilateral leg weakness and pain. Patient typically is able to ambulate at home with the assistance of a cane for balance. Patient denies any recent strenuous exercise, traumatic injury, or recent illness. Admits to drug use in the past but continued to deny any recent drug or alcohol use. States that he experienced some mild chest tightness last night, but that it has since resolved. Denies any SOB, N/V, abdominal pain, fever, or chills.    Assessment & Plan:   Rhabdomyolysis  Labs on arrival to ED revealed CK > 50,000 and elevated liver enzymes, consistent with rhabdomyolysis. Urine toxicology came back positive for cocaine. After presenting these findings to the patient, he finally admitted that he smoked crack cocaine a few days ago. Given the lack of any recent strenuous exercise, traumatic injury, or viral illness, it is highly likely that the etiology of this rhabdomyolysis is his recent cocaine use. Repeat CK this morning remains > 50,000. Continue IVF 300 cc/hr with strict I/O. Continue to monitor CK, CMP, and phosphorus q 12 hrs.   DM w/ neuropathy, non-insulin dependent Continue to hold Metformin and manage with sliding-scale insulin. CBG 4 times daily. Continue carb-modified diet. Continue home gabapentin for neuropathy.   H/o lumbar spinal fusion secondary to spinal stenosis Imaging was ordered in ED to r/o myelopathy. MRI  demonstrated fusion intact and without residual spinal stenosis.   BPH  Continue home meds - proscar, flomax.  Schizophrenia Continue home meds - paliperidone, trazodone.  Tobacco use Counsel for tobacco cessation.   DVT prophylaxis: Lovenox Code Status: Full-code Family Communication: None at bedside Disposition Plan: Will continue to observe inpatient as strength slowly returns   Consultants:   Physical Therapy  Procedures:   None  Antimicrobials:  None   Objective: Vitals:   08/30/18 1618 08/30/18 2158 08/30/18 2300 08/31/18 0532  BP: (!) 145/70 (!) 173/76 (!) 156/78 (!) 165/78  Pulse: (!) 102 (!) 102 98 (!) 103  Resp: 20 20  20   Temp: 99.3 F (37.4 C) 98 F (36.7 C)  99.8 F (37.7 C)  TempSrc: Oral Oral  Oral  SpO2: 100% 99%  99%  Weight: 82.9 kg     Height: 6' (1.829 m)       Intake/Output Summary (Last 24 hours) at 08/31/2018 1022 Last data filed at 08/31/2018 0551 Gross per 24 hour  Intake 5852.52 ml  Output 3450 ml  Net 2402.52 ml   Filed Weights   08/30/18 0842 08/30/18 1618  Weight: 85.3 kg 82.9 kg    Examination:  General exam: Patient appears fatigued, in discomfort.  Respiratory system: Clear to auscultation. No wheezes, crackles, or rhonchi Cardiovascular system: S1 & S2 heard, RRR. No JVD, murmurs, rubs or gallops Gastrointestinal system: Abdomen is nondistended, soft and nontender. No organomegaly or masses felt. Normal bowel sounds heard. Central nervous system: Alert and oriented. No focal neurological deficits. Extremities: No pedal edema.  Skin: No rashes or ulcers. 2 mm pinprick bite  left medial thigh, neither red nor indurated. Psychiatry: Judgment and insight appear normal. Mood & affect appropriate.    Data Reviewed: I have personally reviewed following labs and imaging studies  CBC: Recent Labs  Lab 08/30/18 0904 08/31/18 0514  WBC 18.9* 13.6*  HGB 12.9* 13.2  HCT 37.9* 39.1  MCV 94.5 95.6  PLT 216 270   Basic  Metabolic Panel: Recent Labs  Lab 08/30/18 0904 08/30/18 1631 08/30/18 1946 08/31/18 0514  NA 137 139  --  140  K 3.6 3.6  --  3.9  CL 102 108  --  109  CO2 23 19*  --  20*  GLUCOSE 173* 137*  --  154*  BUN 15 13  --  10  CREATININE 1.12 1.07  --  1.10  CALCIUM 9.5 9.0  --  8.8*  PHOS  --   --  2.7 2.7   GFR: Estimated Creatinine Clearance: 77.4 mL/min (by C-G formula based on SCr of 1.1 mg/dL). Liver Function Tests: Recent Labs  Lab 08/30/18 0904 08/30/18 1631 08/31/18 0514  AST 559* 1,419* 1,392*  ALT 88* 218* 264*  ALKPHOS 98 90 80  BILITOT 0.6 0.5 0.6  PROT 8.0 7.0 6.8  ALBUMIN 4.1 3.5 3.2*   No results for input(s): LIPASE, AMYLASE in the last 168 hours. No results for input(s): AMMONIA in the last 168 hours. Coagulation Profile: No results for input(s): INR, PROTIME in the last 168 hours. Cardiac Enzymes: Recent Labs  Lab 08/30/18 0904 08/30/18 1631 08/31/18 0514  CKTOTAL >50,000* >50,000* >50,000*   BNP (last 3 results) No results for input(s): PROBNP in the last 8760 hours. HbA1C: No results for input(s): HGBA1C in the last 72 hours. CBG: Recent Labs  Lab 08/30/18 1723 08/30/18 2201 08/31/18 0815  GLUCAP 140* 192* 200*   Lipid Profile: No results for input(s): CHOL, HDL, LDLCALC, TRIG, CHOLHDL, LDLDIRECT in the last 72 hours. Thyroid Function Tests: Recent Labs    08/30/18 1631  TSH 1.461   Anemia Panel: No results for input(s): VITAMINB12, FOLATE, FERRITIN, TIBC, IRON, RETICCTPCT in the last 72 hours. Sepsis Labs: No results for input(s): PROCALCITON, LATICACIDVEN in the last 168 hours.  Recent Results (from the past 240 hour(s))  Urine culture     Status: None   Collection Time: 08/30/18 11:50 AM  Result Value Ref Range Status   Specimen Description URINE, CLEAN CATCH  Final   Special Requests NONE  Final   Culture   Final    NO GROWTH Performed at [facility], [address] [place], [place] [address]    Report Status  08/31/2018 FINAL  Final      Radiology Studies: Mr Lumbar Spine Wo Contrast  Result Date: 08/30/2018 CLINICAL DATA:  Bilateral thigh pain beginning yesterday. Lower extremity weakness. Prior lumbar fusion. EXAM: MRI LUMBAR SPINE WITHOUT CONTRAST TECHNIQUE: Multiplanar, multisequence MR imaging of the lumbar spine was performed. No intravenous contrast was administered. COMPARISON:  06/25/2017 FINDINGS: Segmentation:  Standard. Alignment:  New anterolisthesis of L5 on S1 measuring 3 mm. Vertebrae: Interval L5-S1 posterior and interbody fusion. Prior L2-L5 posterior fusion and L4-5 interbody fusion. No fracture, suspicious osseous lesion, or significant marrow edema. Conus medullaris and cauda equina: Conus extends to the L1 level. Conus and cauda equina appear normal. Paraspinal and other soft tissues: Postoperative changes in the posterior lumbar soft tissues with small posterior fluid collections visible at L3-4 and L5-S1 measuring less than 1 cm in AP thickness. Unchanged 11 mm T2 hyperintense  lesion posteriorly in the left kidney, likely a cyst. Disc levels: T12-L1: Mild facet arthrosis without disc herniation or stenosis. L1-2: Mild facet arthrosis without disc herniation or stenosis, unchanged. L2-3: Prior fusion.  No stenosis. L3-4: Prior posterior decompression and fusion. Unchanged mild foraminal endplate spurring without significant stenosis. L4-5: Prior posterior decompression and fusion. No significant stenosis. L5-S1: Interval posterior decompression and fusion. No significant residual spinal or lateral recess stenosis. Limited assessment of the neural foramina due to susceptibility artifact with possibly mild right neural foraminal stenosis. The right S1 screw is more inferiorly located than that on the left and courses medial to the pedicle potentially a pinching upon the right S1 nerve root. IMPRESSION: 1. Interval L5-S1 fusion without residual spinal stenosis. Inferomedial course of the right  S1 screw potentially affecting the right S1 nerve root. 2. Unchanged appearance of L2-L5 fusion. Electronically Signed   By: Logan Bores M.D.   On: 08/30/2018 14:47      Scheduled Meds: . cholecalciferol  1,000 Units Oral Daily  . enoxaparin (LOVENOX) injection  40 mg Subcutaneous Q24H  . ferrous gluconate  324 mg Oral BID WC  . finasteride  5 mg Oral Daily  . gabapentin  300 mg Oral QHS  . Influenza vac split quadrivalent PF  0.5 mL Intramuscular Tomorrow-1000  . insulin aspart  0-5 Units Subcutaneous QHS  . insulin aspart  0-9 Units Subcutaneous TID WC  . paliperidone  3 mg Oral QHS  . sertraline  100 mg Oral QHS  . tamsulosin  0.4 mg Oral QPC supper  . traZODone  75 mg Oral QHS   Continuous Infusions: . sodium chloride    . sodium chloride 300 mL/hr at 08/31/18 0938     LOS: 1 day    Time spent: Total of 30 minutes spent with pt, greater than 50% of which was spent in discussion of  treatment, counseling and coordination of care    Krista Blue, PA-S Pager: Text Page via [email or web address]   If 7PM-7AM, please contact night-coverage [email or web address] 08/31/2018, 10:22 AM   Note - This record has been created using [employer]. Chart creation errors have been sought, but may not always have been located. Such creation errors do not reflect on the standard of medical care.

## 2018-09-01 DIAGNOSIS — F1410 Cocaine abuse, uncomplicated: Secondary | ICD-10-CM

## 2018-09-01 DIAGNOSIS — R7401 Elevation of levels of liver transaminase levels: Secondary | ICD-10-CM

## 2018-09-01 DIAGNOSIS — R740 Nonspecific elevation of levels of transaminase and lactic acid dehydrogenase [LDH]: Secondary | ICD-10-CM

## 2018-09-01 LAB — COMPREHENSIVE METABOLIC PANEL
ALBUMIN: 2.7 g/dL — AB (ref 3.5–5.0)
ALK PHOS: 73 U/L (ref 38–126)
ALT: 196 U/L — ABNORMAL HIGH (ref 0–44)
ANION GAP: 5 (ref 5–15)
AST: 648 U/L — AB (ref 15–41)
BILIRUBIN TOTAL: 0.7 mg/dL (ref 0.3–1.2)
BUN: 7 mg/dL — AB (ref 8–23)
CALCIUM: 8.5 mg/dL — AB (ref 8.9–10.3)
CO2: 21 mmol/L — ABNORMAL LOW (ref 22–32)
Chloride: 116 mmol/L — ABNORMAL HIGH (ref 98–111)
Creatinine, Ser: 1.07 mg/dL (ref 0.61–1.24)
GFR calc Af Amer: >60 mL/min (ref >60)
GFR calc non Af Amer: >60 mL/min (ref >60)
GLUCOSE: 151 mg/dL — AB (ref 70–99)
Potassium: 3.4 mmol/L — ABNORMAL LOW (ref 3.5–5.1)
SODIUM: 142 mmol/L (ref 135–145)
TOTAL PROTEIN: 6.1 g/dL — AB (ref 6.5–8.1)

## 2018-09-01 LAB — GLUCOSE, CAPILLARY
Glucose-Capillary: 138 mg/dL — ABNORMAL HIGH (ref 70–99)
Glucose-Capillary: 127 mg/dL — ABNORMAL HIGH (ref 70–99)
Glucose-Capillary: 132 mg/dL — ABNORMAL HIGH (ref 70–99)
Glucose-Capillary: 142 mg/dL — ABNORMAL HIGH (ref 70–99)
Glucose-Capillary: 143 mg/dL — ABNORMAL HIGH (ref 70–99)

## 2018-09-01 LAB — CK: Total CK: 43446 U/L — ABNORMAL HIGH (ref 49–397)

## 2018-09-01 MED ORDER — BISACODYL 10 MG RE SUPP: 10.0000 mg | Freq: Every day | RECTAL | Status: DC | PRN

## 2018-09-01 MED ORDER — POTASSIUM CHLORIDE CRYS ER 20 MEQ PO TBCR
40.0000 meq | EXTENDED_RELEASE_TABLET | Freq: Once | ORAL | Status: AC
  Administered 2018-09-01: 40 meq via ORAL
  Filled 2018-09-01: qty 2

## 2018-09-01 MED ORDER — POLYETHYLENE GLYCOL 3350 17 G PO PACK
17.0000 g | PACK | Freq: Every day | ORAL | Status: DC
  Administered 2018-09-01 – 2018-09-05 (×5): 17 g via ORAL
  Filled 2018-09-01 (×8): qty 1

## 2018-09-01 MED ORDER — SENNOSIDES-DOCUSATE SODIUM 8.6-50 MG PO TABS
2.0000 | ORAL_TABLET | Freq: Every day | ORAL | Status: DC
  Administered 2018-09-01 – 2018-09-07 (×7): 2 via ORAL
  Filled 2018-09-01 (×7): qty 2

## 2018-09-01 NOTE — Progress Notes (Signed)
PROGRESS NOTE [facility]   [name]   WYO:[id number] DOB: Jan 07, 1956  DOA: 08/30/2018 PCP: Nolene Ebbs, MD   Brief Narrative:  [name] is a 62 year old male with PMH of schizophrenia, PVD, DM, and spinal stenosis. Patient presented to ED from home via EMS yesterday for acute onset, severe bilateral leg pain and weakness preventing him from being able to ambulate independently. Labs in the ED revealed CK > 50,000 and patient was admitted to inpatient for non-traumatic rhabdomyolysis.   Subjective: Patient reports that his bilateral proximal leg pain and weakness has improved significantly from yesterday, but pain still present 5/10. Patient also reports intermittent "fluttering" of the heart. Patient denies any chest pain, SOB, abdominal pain, nausea, vomiting, fever, or chills.   Assessment & Plan:  Rhabdomyolysis Labs on arrival to ED revealed CK > 50,000 and elevated liver enzymes, consistent with rhabdomyolysis. Suspect that this is secondary to cocaine use. CK has improved today from > 50,000 to 43,446. Continue IVF 300 cc/hr with strict I/O. Continue to monitor CK, CMP, and phosphorus q 12 hrs. Patient demonstrated some improved leg strength bilaterally on exam today. Continue PT.   Transaminitis Secondary to the rhabdomyolysis. AST improved from 1392 to 648, ALT improved from 264 to 196. Expect these to continue trending down with IVF and recovery from rhabdomyolysis.   DM w/ neuropathy, non-insulin dependent Continue to hold Metformin and manage with sliding-scale insulin. CBG 4 times daily. Continue carb-modified diet. Continue home gabapentin for neuropathy.   BPH Continue home meds - proscar, flomax.  Schizophrenia Stable. Continue home meds - paliperidone, trazodone.  H/o lumbar spinal fusion secondary to spinal stenosis Imaging was ordered in ED to r/o myelopathy. MRI demonstrated fusion intact and without residual spinal stenosis.   Cocaine  use Provided counsel for cessation of smoking crack cocaine.   Tobacco abuse Nicoderm patch 21 mg daily. Counseled on cessation.  DVT prophylaxis: Lovenox Code Status: Full-code Family Communication: None at bedside Disposition Plan: Will continue manage inpatient until strength improves and CK levels drop.   Consultants:   Physical Therapy  Procedures:   None  Antimicrobials:  None  Objective: Vitals:   08/31/18 0532 08/31/18 1308 08/31/18 2204 09/01/18 0523  BP: (!) 165/78 (!) 161/67 (!) 165/68 (!) 180/85  Pulse: (!) 103 93 (!) 103 (!) 112  Resp: 20 20 18    Temp: 99.8 F (37.7 C) 99.1 F (37.3 C)  98.3 F (36.8 C)  TempSrc: Oral Oral  Oral  SpO2: 99% 99% 97% 99%  Weight:      Height:        Intake/Output Summary (Last 24 hours) at 09/01/2018 0942 Last data filed at 09/01/2018 0137 Gross per 24 hour  Intake 3737.07 ml  Output 4500 ml  Net -762.93 ml   Filed Weights   08/30/18 0842 08/30/18 1618  Weight: 85.3 kg 82.9 kg    Examination:  General exam: Patient appears fatigued, in mild discomfort.  Respiratory system: Clear to auscultation bilaterally. No wheezing or increased respiratory effort. Cardiovascular system: S1 & S2 heard, mildly tachycardic. No JVD, murmurs, rubs or gallops Gastrointestinal system: Abdomen is nondistended, soft and nontender. No organomegaly or masses felt. Normal bowel sounds heard. Central nervous system: Alert and oriented. No focal neurological deficits. Extremities: No pedal edema. Weakness in legs bilaterally. Thighs no longer TTP.  Skin: No rashes or ulcers. 2 mm pinprick bite left medial thigh, neither red nor indurated. Psychiatry: Judgment and insight appear normal. Mood & affect appropriate.  Data Reviewed: I have personally reviewed following labs and imaging studies  CBC: Recent Labs  Lab 08/30/18 0904 08/31/18 0514  WBC 18.9* 13.6*  HGB 12.9* 13.2  HCT 37.9* 39.1  MCV 94.5 95.6  PLT 216 170    Basic Metabolic Panel: Recent Labs  Lab 08/30/18 0904 08/30/18 1631 08/30/18 1946 08/31/18 0514 09/01/18 0318  NA 137 139  --  140 142  K 3.6 3.6  --  3.9 3.4*  CL 102 108  --  109 116*  CO2 23 19*  --  20* 21*  GLUCOSE 173* 137*  --  154* 151*  BUN 15 13  --  10 7*  CREATININE 1.12 1.07  --  1.10 1.07  CALCIUM 9.5 9.0  --  8.8* 8.5*  PHOS  --   --  2.7 2.7  --    GFR: Estimated Creatinine Clearance: 79.6 mL/min (by C-G formula based on SCr of 1.07 mg/dL). Liver Function Tests: Recent Labs  Lab 08/30/18 0904 08/30/18 1631 08/31/18 0514 09/01/18 0318  AST 559* 1,419* 1,392* 648*  ALT 88* 218* 264* 196*  ALKPHOS 98 90 80 73  BILITOT 0.6 0.5 0.6 0.7  PROT 8.0 7.0 6.8 6.1*  ALBUMIN 4.1 3.5 3.2* 2.7*   No results for input(s): LIPASE, AMYLASE in the last 168 hours. No results for input(s): AMMONIA in the last 168 hours. Coagulation Profile: No results for input(s): INR, PROTIME in the last 168 hours. Cardiac Enzymes: Recent Labs  Lab 08/30/18 0904 08/30/18 1631 08/31/18 0514 09/01/18 0318  CKTOTAL >50,000* >50,000* >50,000* 43,446*   BNP (last 3 results) No results for input(s): PROBNP in the last 8760 hours. HbA1C: No results for input(s): HGBA1C in the last 72 hours. CBG: Recent Labs  Lab 08/31/18 0815 08/31/18 1154 08/31/18 1649 08/31/18 2204 09/01/18 0827  GLUCAP 200* 175* 160* 163* 138*   Lipid Profile: No results for input(s): CHOL, HDL, LDLCALC, TRIG, CHOLHDL, LDLDIRECT in the last 72 hours. Thyroid Function Tests: Recent Labs    08/30/18 1631  TSH 1.461   Anemia Panel: No results for input(s): VITAMINB12, FOLATE, FERRITIN, TIBC, IRON, RETICCTPCT in the last 72 hours. Sepsis Labs: No results for input(s): PROCALCITON, LATICACIDVEN in the last 168 hours.  Recent Results (from the past 240 hour(s))  Urine culture     Status: None   Collection Time: 08/30/18 11:50 AM  Result Value Ref Range Status   Specimen Description URINE, CLEAN  CATCH  Final   Special Requests NONE  Final   Culture   Final    NO GROWTH Performed at [facility], [address], [place], [place] [address]    Report Status 08/31/2018 FINAL  Final      Radiology Studies: Mr Lumbar Spine Wo Contrast  Result Date: 08/30/2018 CLINICAL DATA:  Bilateral thigh pain beginning yesterday. Lower extremity weakness. Prior lumbar fusion. EXAM: MRI LUMBAR SPINE WITHOUT CONTRAST TECHNIQUE: Multiplanar, multisequence MR imaging of the lumbar spine was performed. No intravenous contrast was administered. COMPARISON:  06/25/2017 FINDINGS: Segmentation:  Standard. Alignment:  New anterolisthesis of L5 on S1 measuring 3 mm. Vertebrae: Interval L5-S1 posterior and interbody fusion. Prior L2-L5 posterior fusion and L4-5 interbody fusion. No fracture, suspicious osseous lesion, or significant marrow edema. Conus medullaris and cauda equina: Conus extends to the L1 level. Conus and cauda equina appear normal. Paraspinal and other soft tissues: Postoperative changes in the posterior lumbar soft tissues with small posterior fluid collections visible at L3-4 and L5-S1 measuring less than  1 cm in AP thickness. Unchanged 11 mm T2 hyperintense lesion posteriorly in the left kidney, likely a cyst. Disc levels: T12-L1: Mild facet arthrosis without disc herniation or stenosis. L1-2: Mild facet arthrosis without disc herniation or stenosis, unchanged. L2-3: Prior fusion.  No stenosis. L3-4: Prior posterior decompression and fusion. Unchanged mild foraminal endplate spurring without significant stenosis. L4-5: Prior posterior decompression and fusion. No significant stenosis. L5-S1: Interval posterior decompression and fusion. No significant residual spinal or lateral recess stenosis. Limited assessment of the neural foramina due to susceptibility artifact with possibly mild right neural foraminal stenosis. The right S1 screw is more inferiorly located than that on the left and courses  medial to the pedicle potentially a pinching upon the right S1 nerve root. IMPRESSION: 1. Interval L5-S1 fusion without residual spinal stenosis. Inferomedial course of the right S1 screw potentially affecting the right S1 nerve root. 2. Unchanged appearance of L2-L5 fusion. Electronically Signed   By: Logan Bores M.D.   On: 08/30/2018 14:47      Scheduled Meds: . cholecalciferol  1,000 Units Oral Daily  . enoxaparin (LOVENOX) injection  40 mg Subcutaneous Q24H  . ferrous gluconate  324 mg Oral BID WC  . finasteride  5 mg Oral Daily  . gabapentin  300 mg Oral QHS  . Influenza vac split quadrivalent PF  0.5 mL Intramuscular Tomorrow-1000  . insulin aspart  0-5 Units Subcutaneous QHS  . insulin aspart  0-9 Units Subcutaneous TID WC  . nicotine  21 mg Transdermal Daily  . paliperidone  3 mg Oral QHS  . potassium chloride  40 mEq Oral Once  . sertraline  100 mg Oral QHS  . tamsulosin  0.4 mg Oral QPC supper  . traZODone  75 mg Oral QHS   Continuous Infusions: . sodium chloride 200 mL/hr at 09/01/18 0248     LOS: 2 days    Time spent: Total of 30 minutes spent with pt, greater than 50% of which was spent in discussion of  treatment, counseling and coordination of care    Krista Blue, PA-S Pager: Text Page via [email or web address]   If 7PM-7AM, please contact night-coverage [email or web address] 09/01/2018, 9:42 AM   Note - This record has been created using [employer]. Chart creation errors have been sought, but may not always have been located. Such creation errors do not reflect on the standard of medical care.

## 2018-09-02 LAB — GLUCOSE, CAPILLARY
Glucose-Capillary: 130 mg/dL — ABNORMAL HIGH (ref 70–99)
Glucose-Capillary: 146 mg/dL — ABNORMAL HIGH (ref 70–99)
Glucose-Capillary: 124 mg/dL — ABNORMAL HIGH (ref 70–99)
Glucose-Capillary: 131 mg/dL — ABNORMAL HIGH (ref 70–99)

## 2018-09-02 LAB — COMPREHENSIVE METABOLIC PANEL
ALK PHOS: 65 U/L (ref 38–126)
ALT: 149 U/L — AB (ref 0–44)
AST: 421 U/L — ABNORMAL HIGH (ref 15–41)
Albumin: 2.5 g/dL — ABNORMAL LOW (ref 3.5–5.0)
Anion gap: 10 (ref 5–15)
BUN: 7 mg/dL — ABNORMAL LOW (ref 8–23)
CO2: 21 mmol/L — AB (ref 22–32)
Calcium: 8.6 mg/dL — ABNORMAL LOW (ref 8.9–10.3)
Chloride: 110 mmol/L (ref 98–111)
Creatinine, Ser: 1.13 mg/dL (ref 0.61–1.24)
GFR calc Af Amer: >60 mL/min (ref >60)
GFR calc non Af Amer: >60 mL/min (ref >60)
GLUCOSE: 154 mg/dL — AB (ref 70–99)
POTASSIUM: 3.4 mmol/L — AB (ref 3.5–5.1)
SODIUM: 141 mmol/L (ref 135–145)
Total Bilirubin: 0.8 mg/dL (ref 0.3–1.2)
Total Protein: 5.8 g/dL — ABNORMAL LOW (ref 6.5–8.1)

## 2018-09-02 LAB — CK: Total CK: 11968 U/L — ABNORMAL HIGH (ref 49–397)

## 2018-09-02 MED ORDER — POTASSIUM CHLORIDE CRYS ER 20 MEQ PO TBCR
40.0000 meq | EXTENDED_RELEASE_TABLET | Freq: Once | ORAL | Status: AC
  Administered 2018-09-02: 40 meq via ORAL
  Filled 2018-09-02: qty 2

## 2018-09-02 NOTE — Progress Notes (Signed)
Physical Therapy Treatment Patient Details Name: [name] MRN: [id number] DOB: 07-30-1956 Today's Date: 09/02/2018    History of Present Illness Pt is a 62 y.o. male admitted 08/30/18 with BLE pain, weakness, and inability to walk; worke up for rhabdomyolysis of unclear cause. PMH includes schizophrenia, PVD, spinal stenosis, DM, L5-S1 PLIF (05/2018).    PT Comments    Patient making progress towards PT goals, tolerated in room ambulation with increased assist and multi modal cues using RW. Continues to have bilateral LE soreness and generalized weakness impeding mobility.   Follow Up Recommendations  SNF;Supervision for mobility/OOB     Equipment Recommendations  Wheelchair (measurements PT)    Recommendations for Other Services       Precautions / Restrictions Precautions Precautions: Fall Restrictions Weight Bearing Restrictions: No    Mobility  Bed Mobility               General bed mobility comments: received in chair  Transfers Overall transfer level: Needs assistance Equipment used: Rolling walker (2 wheeled) Transfers: Sit to/from Stand Sit to Stand: Mod assist;+2 physical assistance;From elevated surface         General transfer comment: +2 physical assist to power up to standing with gait belt and chuck pad. Once upright, patient able to maintain standing with BUE support on Rw  Ambulation/Gait Ambulation/Gait assistance: Min assist Gait Distance (Feet): 20 Feet Assistive device: Rolling walker (2 wheeled) Gait Pattern/deviations: Step-to pattern   Gait velocity interpretation: <1.31 ft/sec, indicative of household ambulator General Gait Details: Patient with noted RLE lag and limited ability to advance, responded well to multi modal cues. Increased time and effort to ambulate in room. Tactile cues for posture and postioning with RW   Stairs             Wheelchair Mobility    Modified Rankin (Stroke Patients Only)       Balance  Overall balance assessment: Needs assistance   Sitting balance-Leahy Scale: Fair       Standing balance-Leahy Scale: Poor Standing balance comment: heavy reliance on RW                            Cognition Arousal/Alertness: Awake/alert Behavior During Therapy: Flat affect Overall Cognitive Status: No family/caregiver present to determine baseline cognitive functioning Area of Impairment: Memory;Safety/judgement;Awareness;Problem solving                   Current Attention Level: Selective Memory: Decreased short-term memory Following Commands: Follows one step commands consistently;Follows multi-step commands consistently Safety/Judgement: Decreased awareness of safety;Decreased awareness of deficits Awareness: Emergent Problem Solving: Slow processing;Difficulty sequencing;Requires verbal cues;Requires tactile cues General Comments: improved ability to follow commands with appropriate timing      Exercises      General Comments        Pertinent Vitals/Pain Pain Assessment: Faces Faces Pain Scale: Hurts little more Pain Location: BLEs Pain Descriptors / Indicators: Sore Pain Intervention(s): Monitored during session    Home Living                      Prior Function            PT Goals (current goals can now be found in the care plan section) Acute Rehab PT Goals Patient Stated Goal: Get a wheelchair to use at home PT Goal Formulation: With patient Time For Goal Achievement: 09/14/18 Potential to Achieve Goals: Fair Progress towards PT  goals: Progressing toward goals    Frequency    Min 2X/week      PT Plan Current plan remains appropriate    Co-evaluation              AM-PAC PT "6 Clicks" Daily Activity  Outcome Measure  Difficulty turning over in bed (including adjusting bedclothes, sheets and blankets)?: Unable Difficulty moving from lying on back to sitting on the side of the bed? : Unable Difficulty sitting  down on and standing up from a chair with arms (e.g., wheelchair, bedside commode, etc,.)?: Unable Help needed moving to and from a bed to chair (including a wheelchair)?: A Lot Help needed walking in hospital room?: Total Help needed climbing 3-5 steps with a railing? : Total 6 Click Score: 7    End of Session Equipment Utilized During Treatment: Gait belt Activity Tolerance: Patient limited by fatigue;Patient limited by pain Patient left: in chair;with call bell/phone within reach;with chair alarm set Nurse Communication: Mobility status PT Visit Diagnosis: Other abnormalities of gait and mobility (R26.89);Muscle weakness (generalized) (M62.81)     Time: 3893-7342 PT Time Calculation (min) (ACUTE ONLY): 21 min  Charges:  $Therapeutic Activity: 8-22 mins                     Alben Deeds, PT DPT  Board Certified Neurologic Specialist [place] Pager [phone] Office [phone]    [name] 09/02/2018, 9:32 AM

## 2018-09-02 NOTE — Clinical Social Work Note (Signed)
Clinical Social Work Assessment  Patient Details  Name: [name] MRN: [id number] Date of Birth: [birth year]  Date of referral:  09/02/18               Reason for consult:  Facility Placement                Permission sought to share information with:  Facility Sport and exercise psychologist, Family Supports Permission granted to share information::  Yes, Verbal Permission Granted  Name::        Agency::  SNFs  Relationship::     Contact Information:     Housing/Transportation Living arrangements for the past 2 months:  Apartment Source of Information:  Patient Patient Interpreter Needed:  None Criminal Activity/Legal Involvement Pertinent to Current Situation/Hospitalization:  No - Comment as needed Significant Relationships:  Friend Lives with:  Self Do you feel safe going back to the place where you live?  No Need for family participation in patient care:  No (Coment)  Care giving concerns:  CSW received consult for possible SNF placement at time of discharge. CSW spoke with patient regarding PT recommendation of SNF placement at time of discharge. Patient reported that he lives alone and needs extra assistance. Patient expressed understanding of PT recommendation and is agreeable to SNF placement at time of discharge. CSW to continue to follow and assist with discharge planning needs.   Social Worker assessment / plan:  CSW spoke with patient concerning possibility of rehab at [facility] before returning home.  Employment status:  Retired Forensic scientist:  Medicare PT Recommendations:  [place] / Referral to community resources:  [place]  Patient/Family's Response to care:  Patient recognizes need for rehab before returning home and is agreeable to a SNF in [place]. Patient reported preference for [facility] since he has been there in the past. CSW will send referral.   Patient/Family's Understanding of and Emotional  Response to Diagnosis, Current Treatment, and Prognosis:  Patient/family is realistic regarding therapy needs and expressed being hopeful for SNF placement. Patient expressed understanding of CSW role and discharge process as well as medical condition. No questions/concerns about plan or treatment.    Emotional Assessment Appearance:  Appears older than stated age Attitude/Demeanor/Rapport:  Gracious Affect (typically observed):  Accepting, Appropriate Orientation:  Oriented to Self, Oriented to Place, Oriented to  Time, Oriented to Situation Alcohol / Substance use:  Illicit Drugs Psych involvement (Current and /or in the community):  No (Comment)  Discharge Needs  Concerns to be addressed:  Care Coordination Readmission within the last 30 days:  No Current discharge risk:  None Barriers to Discharge:  Continued Medical Work up   [employer], LCSW 09/02/2018, 10:23 AM

## 2018-09-02 NOTE — Progress Notes (Signed)
Per [employer], patient has a pending claim with [employer] car insurance and needs permission to contact [employer] to discuss if they will be the payor source for the patient. Patient reported that the claim is still open and he does not want [place] to call and "mess up the claim." Patient reported that he would just go home. CSW will touch base with patient again later today to see if he has changed his mind.   [name] Siddarth Hsiung LCSW [phone]

## 2018-09-02 NOTE — NC FL2 (Signed)
[place] LEVEL OF CARE SCREENING TOOL     IDENTIFICATION  Patient Name: [name] Birthdate: Feb 07, 1956 Sex: male Admission Date (Current Location): 08/30/2018  [facility] and [place] Number:  Herbalist and Address:  [facility] [place]. [facility], [place] [address] [place], [place] [address]      Provider Number: [id number]  Attending Physician Name and Address:  Jonetta Osgood, MD  Relative Name and Phone Number:       Current Level of Care: Hospital Recommended Level of Care: [place] Prior Approval Number:    Date Approved/Denied:   PASRR Number: pending  Discharge Plan: SNF    Current Diagnoses: Patient Active Problem List   Diagnosis Date Noted  . Transaminitis   . Rhabdomyolysis 08/30/2018  . Acute lower UTI 03/24/2018  . Tobacco dependence 03/24/2018  . Cocaine abuse ([place]) 03/24/2018  . AKI (acute kidney injury) ([place]) 11/27/2017  . Acute urinary retention 11/27/2017  . Anemia due to blood loss 06/30/2017    Class: Acute  . Diabetes mellitus without complication ([place]) 86/76/7209    Class: Chronic  . Spinal stenosis of lumbar region 06/29/2017    Class: Chronic  . Cauda equina compression ([place]) 06/29/2017    Class: Acute  . Spondylolisthesis, lumbar region 06/29/2017    Class: Chronic  . History of lumbar spinal fusion 06/26/2017  . Myelopathy ([place]) 06/24/2017  . Low back pain 06/24/2017  . NPH (normal pressure hydrocephalus) ([place]) 10/30/2016  . Bilateral sciatica 09/01/2016  . Altered mental status 07/01/2016  . Acute encephalopathy 07/01/2016  . Hydrocephalus in adult [facility]) 07/01/2016  . Shaking spells 06/01/2016  . Gait disturbance 04/10/2015  . Numbness 04/10/2015  . Lumbar radicular pain 04/10/2015  . Lumbar spondylosis 04/10/2015  . Bipolar I disorder with depression, severe ([place]) 06/30/2014  . Suicidal ideations 06/30/2014  . Ulcer of lower limb, unspecified 02/20/2014  . CELLULITIS AND  ABSCESS OF LEG EXCEPT FOOT 11/08/2007    Orientation RESPIRATION BLADDER Height & Weight     Self, Time, Situation, Place  Normal Incontinent, External catheter Weight: 82.9 kg Height:  6' (182.9 cm)  BEHAVIORAL SYMPTOMS/MOOD NEUROLOGICAL BOWEL NUTRITION STATUS      Continent Diet(Please see DC Summary)  AMBULATORY STATUS COMMUNICATION OF NEEDS Skin   Limited Assist Verbally Normal                       Personal Care Assistance Level of Assistance  Bathing, Feeding, Dressing Bathing Assistance: Maximum assistance Feeding assistance: Limited assistance Dressing Assistance: Limited assistance     Functional Limitations Info  Sight, Hearing, Speech Sight Info: Adequate Hearing Info: Adequate Speech Info: Adequate    SPECIAL CARE FACTORS FREQUENCY  PT (By licensed PT), OT (By licensed OT)     PT Frequency: 5x/week OT Frequency: 3x/week            Contractures Contractures Info: Not present    Additional Factors Info  Code Status, Allergies, Psychotropic, Insulin Sliding Scale Code Status Info: Full Allergies Info: Penicillins Psychotropic Info: Zoloft Insulin Sliding Scale Info: 3x daily with meals and at bedtime       Current Medications (09/02/2018):  This is the current hospital active medication list Current Facility-Administered Medications  Medication Dose Route Frequency Provider Last Rate Last Dose  . 0.9 %  sodium chloride infusion   Intravenous Continuous Jonetta Osgood, MD 200 mL/hr at 09/02/18 0506    . bisacodyl (DULCOLAX) suppository 10  mg  10 mg Rectal Daily PRN Jonetta Osgood, MD      . cholecalciferol (VITAMIN D) tablet 1,000 Units  1,000 Units Oral Daily Janora Norlander, MD   1,000 Units at 09/01/18 1006  . enoxaparin (LOVENOX) injection 40 mg  40 mg Subcutaneous Q24H Janora Norlander, MD   40 mg at 08/30/18 1728  . ferrous gluconate (FERGON) tablet 324 mg  324 mg Oral BID WC Janora Norlander, MD   324 mg at 09/01/18 1747  .  finasteride (PROSCAR) tablet 5 mg  5 mg Oral Daily Janora Norlander, MD   5 mg at 09/01/18 1006  . gabapentin (NEURONTIN) capsule 300 mg  300 mg Oral QHS Janora Norlander, MD   300 mg at 09/01/18 2114  . Influenza vac split quadrivalent PF (FLUARIX) injection 0.5 mL  0.5 mL Intramuscular Tomorrow-1000 Janora Norlander, MD      . insulin aspart (novoLOG) injection 0-5 Units  0-5 Units Subcutaneous QHS Janora Norlander, MD      . insulin aspart (novoLOG) injection 0-9 Units  0-9 Units Subcutaneous TID WC Janora Norlander, MD   1 Units at 09/01/18 1747  . nicotine (NICODERM CQ - dosed in mg/24 hours) patch 21 mg  21 mg Transdermal Daily Jonetta Osgood, MD   21 mg at 09/01/18 1009  . oxyCODONE (Oxy IR/ROXICODONE) immediate release tablet 5 mg  5 mg Oral Q4H PRN Jonetta Osgood, MD      . paliperidone (INVEGA) 24 hr tablet 3 mg  3 mg Oral QHS Janora Norlander, MD   3 mg at 09/01/18 2113  . polyethylene glycol (MIRALAX / GLYCOLAX) packet 17 g  17 g Oral Daily Jonetta Osgood, MD   17 g at 09/01/18 1747  . potassium chloride SA (K-DUR,KLOR-CON) CR tablet 40 mEq  40 mEq Oral Once Jonetta Osgood, MD      . senna-docusate (Senokot-S) tablet 2 tablet  2 tablet Oral QHS Jonetta Osgood, MD   2 tablet at 09/01/18 2114  . sertraline (ZOLOFT) tablet 100 mg  100 mg Oral QHS Janora Norlander, MD   100 mg at 09/01/18 2113  . tamsulosin (FLOMAX) capsule 0.4 mg  0.4 mg Oral QPC supper Janora Norlander, MD   0.4 mg at 09/01/18 1747  . traZODone (DESYREL) tablet 75 mg  75 mg Oral QHS Janora Norlander, MD   75 mg at 09/01/18 2114     Discharge Medications: Please see discharge summary for a list of discharge medications.  Relevant Imaging Results:  Relevant Lab Results:   Additional Information SS#[id number]  Benard Halsted, LCSW

## 2018-09-02 NOTE — Progress Notes (Signed)
PROGRESS NOTE [facility]   [name]   ZOX:[id number] DOB: 1956-06-19  DOA: 08/30/2018 PCP: Nolene Ebbs, MD   Brief Narrative:  [name] is a 62 year old male with PMH of schizophrenia, PVD, DM, and spinal stenosis presented with rhabdomyolysis in the setting of cocaine use.  See below for further details  Subjective: Patient today was seated in upright-position during exam today and reports that he has been performing leg exercises as directed. Patient reports that he continues to improve significantly. He denies any leg pain, abdominal pain, chest pain, SOB, nausea, vomiting, or palpitations. He states that his last bowel movement was approximately one week ago, but maintains that he is still passing gas.   Assessment & Plan: Rhabdomyolysis: Suspect secondary to cocaine use-CK downtrending with IV fluids.  His weakness in the lower extremities is slowly improving-this morning he was able to sit stand up up with minimal assistance.  Continue PT evaluation.    Transaminitis:Secondary to the rhabdomyolysis. AST and ALT continued to improve to 421 and 149, respectively. Expect these to continue to improve with downtrending CK.   Constipation: Continue bowel regimen with MiraLAX and senna-use Dulcolax suppository as needed.  Hypokalemia:Potassium remains unimproved at 3.4. Replete and recheck.   DM w/ neuropathy, non-insulin dependent:CBG stable, continue to monitor. Continue to hold Metformin and manage with sliding-scale insulin. Continue carb-modified diet. Continue home gabapentin for neuropathy.  WJX:BJYNWGNF home meds - proscar, flomax.  Schizophrenia Stable. Continue home meds - paliperidone, trazodone.  H/o lumbar spinal fusion secondary to spinal stenosis:Imaging was ordered in ED to r/o myelopathy. MRI demonstrated fusion intact and without residual spinal stenosis.  Cocaine AOZ:HYQMVHQI counsel for cessation of smoking crack cocaine.   Tobacco  abuse:Nicoderm patch 21 mg daily. Counseled on cessation.  DVT prophylaxis: Lovenox Code Status: Full-code Family Communication: None at bedside Disposition Plan: Continue to monitor overnight with hope to discharge tomorrow, pending PT evaluation   Consultants:   Physical therapy  Procedures:   None  Antimicrobials:  None   Objective: Vitals:   09/01/18 1306 09/01/18 1604 09/01/18 2119 09/02/18 0545  BP: (!) 163/72  (!) 156/72 (!) 174/79  Pulse: 94  98 (!) 116  Resp: 18  15   Temp: 98.4 F (36.9 C) 98.1 F (36.7 C) 98.8 F (37.1 C) 99.9 F (37.7 C)  TempSrc:  Oral Oral Oral  SpO2: 98%  100% 94%  Weight:      Height:        Intake/Output Summary (Last 24 hours) at 09/02/2018 0947 Last data filed at 09/02/2018 0800 Gross per 24 hour  Intake 1309 ml  Output 5200 ml  Net -3891 ml   Filed Weights   08/30/18 0842 08/30/18 1618  Weight: 85.3 kg 82.9 kg    Examination:  General exam: Patient upright in chair, in NAD Respiratory system: Clear to auscultation. No wheezes, crackles, or rhonchi Cardiovascular system: S1 & S2 heard, RRR. No JVD. No murmurs auscultated.  Gastrointestinal system: Normal bowel sounds heard. Abdomen is mildly distended and an indurated mass appreciated in periumbilical region. No tenderness to palpation. No guarding.  Central nervous system: Alert and oriented. No focal neurological deficits. Extremities: No pedal edema. Improved but still diminished strength bilaterally. Patient able to independently stand from chair and take two small, shuffled steps forward before sitting back down. Resting tremor right hand.   Skin: No rashes, lesions, or ulcers Psychiatry: Judgment and insight appear normal. Mood & affect appropriate.    Data Reviewed: I have  personally reviewed following labs and imaging studies  CBC: Recent Labs  Lab 08/30/18 0904 08/31/18 0514  WBC 18.9* 13.6*  HGB 12.9* 13.2  HCT 37.9* 39.1  MCV 94.5 95.6  PLT 216 194     Basic Metabolic Panel: Recent Labs  Lab 08/30/18 0904 08/30/18 1631 08/30/18 1946 08/31/18 0514 09/01/18 0318 09/02/18 0655  NA 137 139  --  140 142 141  K 3.6 3.6  --  3.9 3.4* 3.4*  CL 102 108  --  109 116* 110  CO2 23 19*  --  20* 21* 21*  GLUCOSE 173* 137*  --  154* 151* 154*  BUN 15 13  --  10 7* 7*  CREATININE 1.12 1.07  --  1.10 1.07 1.13  CALCIUM 9.5 9.0  --  8.8* 8.5* 8.6*  PHOS  --   --  2.7 2.7  --   --    GFR: Estimated Creatinine Clearance: 75.3 mL/min (by C-G formula based on SCr of 1.13 mg/dL). Liver Function Tests: Recent Labs  Lab 08/30/18 0904 08/30/18 1631 08/31/18 0514 09/01/18 0318 09/02/18 0655  AST 559* 1,419* 1,392* 648* 421*  ALT 88* 218* 264* 196* 149*  ALKPHOS 98 90 80 73 65  BILITOT 0.6 0.5 0.6 0.7 0.8  PROT 8.0 7.0 6.8 6.1* 5.8*  ALBUMIN 4.1 3.5 3.2* 2.7* 2.5*   No results for input(s): LIPASE, AMYLASE in the last 168 hours. No results for input(s): AMMONIA in the last 168 hours. Coagulation Profile: No results for input(s): INR, PROTIME in the last 168 hours. Cardiac Enzymes: Recent Labs  Lab 08/30/18 0904 08/30/18 1631 08/31/18 0514 09/01/18 0318 09/02/18 0655  CKTOTAL >50,000* >50,000* >50,000* 43,446* 11,968*   BNP (last 3 results) No results for input(s): PROBNP in the last 8760 hours. HbA1C: No results for input(s): HGBA1C in the last 72 hours. CBG: Recent Labs  Lab 09/01/18 1305 09/01/18 1707 09/01/18 1711 09/01/18 2111 09/02/18 0811  GLUCAP 127* 132* 142* 143* 130*   Lipid Profile: No results for input(s): CHOL, HDL, LDLCALC, TRIG, CHOLHDL, LDLDIRECT in the last 72 hours. Thyroid Function Tests: Recent Labs    08/30/18 1631  TSH 1.461   Anemia Panel: No results for input(s): VITAMINB12, FOLATE, FERRITIN, TIBC, IRON, RETICCTPCT in the last 72 hours. Sepsis Labs: No results for input(s): PROCALCITON, LATICACIDVEN in the last 168 hours.  Recent Results (from the past 240 hour(s))  Urine culture      Status: None   Collection Time: 08/30/18 11:50 AM  Result Value Ref Range Status   Specimen Description URINE, CLEAN CATCH  Final   Special Requests NONE  Final   Culture   Final    NO GROWTH Performed at [facility], [address], [place], [place] [address]    Report Status 08/31/2018 FINAL  Final      Radiology Studies: No results found.    Scheduled Meds: . cholecalciferol  1,000 Units Oral Daily  . enoxaparin (LOVENOX) injection  40 mg Subcutaneous Q24H  . ferrous gluconate  324 mg Oral BID WC  . finasteride  5 mg Oral Daily  . gabapentin  300 mg Oral QHS  . Influenza vac split quadrivalent PF  0.5 mL Intramuscular Tomorrow-1000  . insulin aspart  0-5 Units Subcutaneous QHS  . insulin aspart  0-9 Units Subcutaneous TID WC  . nicotine  21 mg Transdermal Daily  . paliperidone  3 mg Oral QHS  . polyethylene glycol  17 g Oral Daily  .  senna-docusate  2 tablet Oral QHS  . sertraline  100 mg Oral QHS  . tamsulosin  0.4 mg Oral QPC supper  . traZODone  75 mg Oral QHS   Continuous Infusions: . sodium chloride 200 mL/hr at 09/02/18 0506     LOS: 3 days    Time spent: Total of 30 minutes spent with pt, greater than 50% of which was spent in discussion of  treatment, counseling and coordination of care    Krista Blue, PA-S Pager: Text Page via [email or web address]   If 7PM-7AM, please contact night-coverage [email or web address] 09/02/2018, 9:47 AM   Attending MD note Patient was seen, examined,treatment plan was discussed with the PA-S.  I have personally reviewed the clinical findings, lab, imaging studies and management of this patient in detail. I agree with the documentation, as recorded by the PA-S.   Patient is doing much better-claims he is moving his lower extremities he is much more freely.  This morning he was able to stand with very minimal assistance he was not able to do this before this hospital stay.   On Exam: Vitals:   09/01/18 1306 09/01/18 1604  09/01/18 2119 09/02/18 0545  BP: (!) 163/72  (!) 156/72 (!) 174/79  Pulse: 94  98 (!) 116  Resp: 18  15   Temp: 98.4 F (36.9 C) 98.1 F (36.7 C) 98.8 F (37.1 C) 99.9 F (37.7 C)  TempSrc:  Oral Oral Oral  SpO2: 98%  100% 94%  Weight:      Height:       Gen. exam: Awake, alert, not in any distress Chest: Good air entry bilaterally, no rhonchi or rales CVS: S1-S2 regular, no murmurs Abdomen: Soft, nontender and nondistended Neurology: Non-focal Skin: No rash or lesions   Lab Data: CBC: Recent Labs  Lab 08/30/18 0904 08/31/18 0514  WBC 18.9* 13.6*  HGB 12.9* 13.2  HCT 37.9* 39.1  MCV 94.5 95.6  PLT 216 818    Basic Metabolic Panel: Recent Labs  Lab 08/30/18 0904 08/30/18 1631 08/30/18 1946 08/31/18 0514 09/01/18 0318 09/02/18 0655  NA 137 139  --  140 142 141  K 3.6 3.6  --  3.9 3.4* 3.4*  CL 102 108  --  109 116* 110  CO2 23 19*  --  20* 21* 21*  GLUCOSE 173* 137*  --  154* 151* 154*  BUN 15 13  --  10 7* 7*  CREATININE 1.12 1.07  --  1.10 1.07 1.13  CALCIUM 9.5 9.0  --  8.8* 8.5* 8.6*  PHOS  --   --  2.7 2.7  --   --    Assessment and plan: Rhabdomyolysis: Likely secondary to cocaine use.  CK downtrending significantly-decrease IV fluids to 125 cc-continue to follow  Lower extremity weakness: Seems to be slowly improving-continue supportive care-suspect this to be a combination of cocaine induced rhabdomyolysis, may he has some underlying neuropathy as well.  MRI lumbar spine negative.  If he were to worsen or no improvement were to plateau-may need further work-up.  DM-2: CBG stable-continue SSI  Rest as above  Teacher, adult education [facility]

## 2018-09-02 NOTE — Progress Notes (Signed)
Pasrr is pending. Patient unable to discharge to SNF without a pasrr.   Richard [name] LCSW [phone]

## 2018-09-03 LAB — COMPREHENSIVE METABOLIC PANEL
ALT: 116 U/L — ABNORMAL HIGH (ref 0–44)
AST: 263 U/L — AB (ref 15–41)
Albumin: 2.3 g/dL — ABNORMAL LOW (ref 3.5–5.0)
Alkaline Phosphatase: 60 U/L (ref 38–126)
Anion gap: 8 (ref 5–15)
BUN: 9 mg/dL (ref 8–23)
CO2: 22 mmol/L (ref 22–32)
Calcium: 8.6 mg/dL — ABNORMAL LOW (ref 8.9–10.3)
Chloride: 110 mmol/L (ref 98–111)
Creatinine, Ser: 1.15 mg/dL (ref 0.61–1.24)
GFR calc Af Amer: >60 mL/min (ref >60)
Glucose, Bld: 151 mg/dL — ABNORMAL HIGH (ref 70–99)
Potassium: 3.5 mmol/L (ref 3.5–5.1)
Sodium: 140 mmol/L (ref 135–145)
Total Bilirubin: 0.8 mg/dL (ref 0.3–1.2)
Total Protein: 5.5 g/dL — ABNORMAL LOW (ref 6.5–8.1)

## 2018-09-03 LAB — GLUCOSE, CAPILLARY
Glucose-Capillary: 158 mg/dL — ABNORMAL HIGH (ref 70–99)
Glucose-Capillary: 130 mg/dL — ABNORMAL HIGH (ref 70–99)
Glucose-Capillary: 137 mg/dL — ABNORMAL HIGH (ref 70–99)
Glucose-Capillary: 173 mg/dL — ABNORMAL HIGH (ref 70–99)

## 2018-09-03 LAB — CK: CK TOTAL: 10075 U/L — AB (ref 49–397)

## 2018-09-03 NOTE — Progress Notes (Signed)
PROGRESS NOTE [facility]   ROBEN SCHLIEP   SAY:[id number] DOB: [birth year]  DOA: 08/30/2018 PCP: Nolene Ebbs, MD   Brief Narrative:  [name] is a 62 year old male with PMH of schizophrenia, PVD, DM, and spinal stenosis presented with rhabdomyolysis in the setting of cocaine use.  See below for further details  Subjective: Less pain in the upper thigh area bilaterally-still has problems with ambulating.  But overall better than how he came in with.   Assessment & Plan: Rhabdomyolysis with bilateral hip muscle: Secondary to cocaine use-no history of vigorous exercise etc.  He did have a weakness in his lower extremities/thighs bilaterally but seems to be slowly improving however he still is significantly weak.  Case was discussed with neurologist-Dr. McNeil-recommend supportive care-he expects these findings to last for several weeks if not longer.  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  MRI of the lumbosacral spine did not show any structural abnormalities.  Continue PT evaluation.  CK slowly downtrending-continue IV fluids.  Transaminitis: Secondary to above-slowly downtrending.  Follow periodically  Constipation: Per nursing staff-had BM on 10/4-continue bowel regimen with MiraLAX and senna.   Hypokalemia: Repleted-follow  DM w/ neuropathy, non-insulin dependent: CBG stable with SSI  ATF:TDDUKGUR home meds - proscar, flomax.  Schizophrenia: Stable continue paliperidone and trazodone  H/o lumbar spinal fusion secondary to spinal stenosis:Imaging was ordered in ED to r/o myelopathy. MRI demonstrated fusion intact and without residual spinal stenosis.  Cocaine KYH:CWCBJSEG counsel for cessation of smoking crack cocaine.   Tobacco abuse: Counseled-continue transdermal nicotine  DVT prophylaxis: Lovenox Code Status: Full-code Family Communication: None at  bedside Disposition Plan: Probably home or SNF early next week.   Consultants:   Physical therapy  Procedures:   None  Antimicrobials:  None   Objective: Vitals:   09/02/18 1339 09/02/18 2159 09/02/18 2159 09/03/18 0534  BP: 118/62 (!) 119/56 (!) 119/56 (!) 144/67  Pulse: (!) 102 95 92 (!) 102  Resp: 18     Temp:  99.6 F (37.6 C) 99.6 F (37.6 C) 98.9 F (37.2 C)  TempSrc:  Oral Oral Oral  SpO2:  97% 97% 97%  Weight:      Height:        Intake/Output Summary (Last 24 hours) at 09/03/2018 1346 Last data filed at 09/03/2018 1030 Gross per 24 hour  Intake 4995.1 ml  Output 3625 ml  Net 1370.1 ml   Filed Weights   08/30/18 0842 08/30/18 1618  Weight: 85.3 kg 82.9 kg    Examination: General appearance:Awake, alert, not in any distress.  Eyes:no scleral icterus. HEENT: Atraumatic and Normocephalic Neck: supple, no JVD. Resp:Good air entry bilaterally,no rales or rhonchi CVS: S1 S2 regular, no murmurs.  GI: Bowel sounds present, Non tender and not distended with no gaurding, rigidity or rebound. Extremities: B/L Lower Ext shows no edema, both legs are warm to touch Neurology: Moves all 4 extremities-but still has difficulty standing from a sitting position and ambulating. Psychiatric: Normal judgment and insight. Normal mood. Musculoskeletal:No digital cyanosis Skin:No Rash, warm and dry Wounds:N/A   Data Reviewed: I have personally reviewed following labs and imaging studies  CBC: Recent Labs  Lab 08/30/18 0904 08/31/18 0514  WBC 18.9* 13.6*  HGB 12.9* 13.2  HCT 37.9* 39.1  MCV 94.5 95.6  PLT 216 315   Basic Metabolic Panel: Recent Labs  Lab 08/30/18 1631 08/30/18 1946 08/31/18 0514 09/01/18 0318 09/02/18 1761  09/03/18 0314  NA 139  --  140 142 141 140  K 3.6  --  3.9 3.4* 3.4* 3.5  CL 108  --  109 116* 110 110  CO2 19*  --  20* 21* 21* 22  GLUCOSE 137*  --  154* 151* 154* 151*  BUN 13  --  10 7* 7* 9  CREATININE 1.07  --  1.10 1.07  1.13 1.15  CALCIUM 9.0  --  8.8* 8.5* 8.6* 8.6*  PHOS  --  2.7 2.7  --   --   --    GFR: Estimated Creatinine Clearance: 74 mL/min (by C-G formula based on SCr of 1.15 mg/dL). Liver Function Tests: Recent Labs  Lab 08/30/18 1631 08/31/18 0514 09/01/18 0318 09/02/18 0655 09/03/18 0314  AST 1,419* 1,392* 648* 421* 263*  ALT 218* 264* 196* 149* 116*  ALKPHOS 90 80 73 65 60  BILITOT 0.5 0.6 0.7 0.8 0.8  PROT 7.0 6.8 6.1* 5.8* 5.5*  ALBUMIN 3.5 3.2* 2.7* 2.5* 2.3*   No results for input(s): LIPASE, AMYLASE in the last 168 hours. No results for input(s): AMMONIA in the last 168 hours. Coagulation Profile: No results for input(s): INR, PROTIME in the last 168 hours. Cardiac Enzymes: Recent Labs  Lab 08/30/18 1631 08/31/18 0514 09/01/18 0318 09/02/18 0655 09/03/18 0314  CKTOTAL >50,000* >50,000* 43,446* 11,968* 10,075*   BNP (last 3 results) No results for input(s): PROBNP in the last 8760 hours. HbA1C: No results for input(s): HGBA1C in the last 72 hours. CBG: Recent Labs  Lab 09/02/18 1202 09/02/18 1728 09/02/18 2200 09/03/18 0738 09/03/18 1224  GLUCAP 146* 124* 131* 158* 130*   Lipid Profile: No results for input(s): CHOL, HDL, LDLCALC, TRIG, CHOLHDL, LDLDIRECT in the last 72 hours. Thyroid Function Tests: No results for input(s): TSH, T4TOTAL, FREET4, T3FREE, THYROIDAB in the last 72 hours. Anemia Panel: No results for input(s): VITAMINB12, FOLATE, FERRITIN, TIBC, IRON, RETICCTPCT in the last 72 hours. Sepsis Labs: No results for input(s): PROCALCITON, LATICACIDVEN in the last 168 hours.  Recent Results (from the past 240 hour(s))  Urine culture     Status: None   Collection Time: 08/30/18 11:50 AM  Result Value Ref Range Status   Specimen Description URINE, CLEAN CATCH  Final   Special Requests NONE  Final   Culture   Final    NO GROWTH Performed at [facility], [address], [place], [place] [address]    Report Status 08/31/2018 FINAL  Final       Radiology Studies: No results found.    Scheduled Meds: . cholecalciferol  1,000 Units Oral Daily  . enoxaparin (LOVENOX) injection  40 mg Subcutaneous Q24H  . ferrous gluconate  324 mg Oral BID WC  . finasteride  5 mg Oral Daily  . gabapentin  300 mg Oral QHS  . Influenza vac split quadrivalent PF  0.5 mL Intramuscular Tomorrow-1000  . insulin aspart  0-5 Units Subcutaneous QHS  . insulin aspart  0-9 Units Subcutaneous TID WC  . nicotine  21 mg Transdermal Daily  . paliperidone  3 mg Oral QHS  . polyethylene glycol  17 g Oral Daily  . senna-docusate  2 tablet Oral QHS  . sertraline  100 mg Oral QHS  . tamsulosin  0.4 mg Oral QPC supper  . traZODone  75 mg Oral QHS   Continuous Infusions: . sodium chloride 175 mL/hr at 09/03/18 0851     LOS: 4 days    Time spent:  Total of 30 minutes spent with pt, greater than 50% of which was spent in discussion of  treatment, counseling and coordination of care    Oren Binet, PA-S Pager: Text Page via [email or web address]   If 7PM-7AM, please contact night-coverage [email or web address] 09/03/2018, 1:46 PM   Attending MD note Patient was seen, examined,treatment plan was discussed with the PA-S.  I have personally reviewed the clinical findings, lab, imaging studies and management of this patient in detail. I agree with the documentation, as recorded by the PA-S.   Patient is doing much better-claims he is moving his lower extremities he is much more freely.  This morning he was able to stand with very minimal assistance he was not able to do this before this hospital stay.   On Exam: Vitals:   09/02/18 1339 09/02/18 2159 09/02/18 2159 09/03/18 0534  BP: 118/62 (!) 119/56 (!) 119/56 (!) 144/67  Pulse: (!) 102 95 92 (!) 102  Resp: 18     Temp:  99.6 F (37.6 C) 99.6 F (37.6 C) 98.9 F (37.2 C)  TempSrc:  Oral Oral Oral  SpO2:  97% 97% 97%  Weight:      Height:       Gen. exam: Awake, alert, not in any distress Chest: Good  air entry bilaterally, no rhonchi or rales CVS: S1-S2 regular, no murmurs Abdomen: Soft, nontender and nondistended Neurology: Non-focal Skin: No rash or lesions   Lab Data: CBC: Recent Labs  Lab 08/30/18 0904 08/31/18 0514  WBC 18.9* 13.6*  HGB 12.9* 13.2  HCT 37.9* 39.1  MCV 94.5 95.6  PLT 216 203    Basic Metabolic Panel: Recent Labs  Lab 08/30/18 1631 08/30/18 1946 08/31/18 0514 09/01/18 0318 09/02/18 0655 09/03/18 0314  NA 139  --  140 142 141 140  K 3.6  --  3.9 3.4* 3.4* 3.5  CL 108  --  109 116* 110 110  CO2 19*  --  20* 21* 21* 22  GLUCOSE 137*  --  154* 151* 154* 151*  BUN 13  --  10 7* 7* 9  CREATININE 1.07  --  1.10 1.07 1.13 1.15  CALCIUM 9.0  --  8.8* 8.5* 8.6* 8.6*  PHOS  --  2.7 2.7  --   --   --    Assessment and plan: Rhabdomyolysis: Likely secondary to cocaine use.  CK downtrending significantly-decrease IV fluids to 125 cc-continue to follow  Lower extremity weakness: Seems to be slowly improving-continue supportive care-suspect this to be a combination of cocaine induced rhabdomyolysis, may he has some underlying neuropathy as well.  MRI lumbar spine negative.  If he were to worsen or no improvement were to plateau-may need further work-up.  DM-2: CBG stable-continue SSI  Rest as above  Teacher, adult education [facility]

## 2018-09-04 LAB — COMPREHENSIVE METABOLIC PANEL
ALBUMIN: 2.2 g/dL — AB (ref 3.5–5.0)
ALK PHOS: 61 U/L (ref 38–126)
ALT: 94 U/L — ABNORMAL HIGH (ref 0–44)
AST: 175 U/L — AB (ref 15–41)
Anion gap: 7 (ref 5–15)
BILIRUBIN TOTAL: 0.6 mg/dL (ref 0.3–1.2)
BUN: 11 mg/dL (ref 8–23)
CALCIUM: 8.8 mg/dL — AB (ref 8.9–10.3)
CO2: 24 mmol/L (ref 22–32)
Chloride: 110 mmol/L (ref 98–111)
Creatinine, Ser: 1.09 mg/dL (ref 0.61–1.24)
GFR calc Af Amer: >60 mL/min (ref >60)
GFR calc non Af Amer: >60 mL/min (ref >60)
GLUCOSE: 162 mg/dL — AB (ref 70–99)
Potassium: 3.4 mmol/L — ABNORMAL LOW (ref 3.5–5.1)
Sodium: 141 mmol/L (ref 135–145)
TOTAL PROTEIN: 5.3 g/dL — AB (ref 6.5–8.1)

## 2018-09-04 LAB — GLUCOSE, CAPILLARY
Glucose-Capillary: 148 mg/dL — ABNORMAL HIGH (ref 70–99)
Glucose-Capillary: 164 mg/dL — ABNORMAL HIGH (ref 70–99)
Glucose-Capillary: 131 mg/dL — ABNORMAL HIGH (ref 70–99)
Glucose-Capillary: 171 mg/dL — ABNORMAL HIGH (ref 70–99)

## 2018-09-04 LAB — CK: Total CK: 5010 U/L — ABNORMAL HIGH (ref 49–397)

## 2018-09-04 MED ORDER — POTASSIUM CHLORIDE CRYS ER 20 MEQ PO TBCR
40.0000 meq | EXTENDED_RELEASE_TABLET | Freq: Once | ORAL | Status: AC
  Administered 2018-09-04: 40 meq via ORAL
  Filled 2018-09-04: qty 2

## 2018-09-04 NOTE — Progress Notes (Signed)
PROGRESS NOTE [facility]   [name]   PJA:[id number] DOB: 16-Oct-1956  DOA: 08/30/2018 PCP: Nolene Ebbs, MD   Brief Narrative:  [name] is a 62 year old male with PMH of schizophrenia, PVD, DM, and spinal stenosis presented with rhabdomyolysis in the setting of cocaine use.  See below for further details  Subjective: Thinks he feels better-was sitting in the chair when I walked in this morning-he was Richard to stand without any assistance-although it took quite a while.  Assessment & Plan: Rhabdomyolysis with bilateral hip muscle: Secondary to cocaine use-no history of vigorous exercise etc.  CK is down trended with supportive care-decrease IV fluids to 125 cc an hour.  Case was discussed with neurologist Dr. Leonides Schanz on 10/5-recommended supportive care-expects weakness and upper thigh muscles the last several weeks.  Continue PT services.  Transaminitis: Secondary to above-liver enzymes improving.  Follow periodically.  Constipation: Resolved-continue bowel regimen with MiraLAX and senna.  Hypokalemia: Replete and recheck.  DM w/ neuropathy, non-insulin dependent: CBG stable with SSI.  BPH: Resume Proscar and Flomax.  Schizophrenia: Stable continue paliperidone and trazodone  H/o lumbar spinal fusion secondary to spinal stenosis:I MRI on admission demonstrated fusion intact and without residual spinal stenosis.  Cocaine HAL:[id number] counsel for cessation of smoking crack cocaine.   Tobacco abuse: Counseled-continue transdermal nicotine  DVT prophylaxis: Lovenox Code Status: Full-code Family Communication: None at bedside Disposition Plan: Probably home or SNF early next week.   Consultants:   Physical therapy  Procedures:   None  Antimicrobials:  None   Objective: Vitals:   09/03/18 0534 09/03/18 1526 09/03/18 2228 09/04/18 0604  BP: (!) 144/67 109/78 (!) 130/58 (!) 120/55  Pulse: (!) 102 87 85 88  Resp:  18 17 17   Temp: 98.9 F  (37.2 C) 99.2 F (37.3 C) 98.5 F (36.9 C) 98.5 F (36.9 C)  TempSrc: Oral Oral Oral Oral  SpO2: 97% 100% 95% 96%  Weight:      Height:        Intake/Output Summary (Last 24 hours) at 09/04/2018 1106 Last data filed at 09/04/2018 0807 Gross per 24 hour  Intake 4885.55 ml  Output 6625 ml  Net -1739.45 ml   Filed Weights   08/30/18 0842 08/30/18 1618  Weight: 85.3 kg 82.9 kg    Examination: General appearance:Awake, alert, not in any distress.  Eyes:no scleral icterus. HEENT: Atraumatic and Normocephalic Neck: supple, no JVD. Resp:Good air entry bilaterally,no rales or rhonchi CVS: S1 S2 regular, no murmurs.  GI: Bowel sounds present, Non tender and not distended with no gaurding, rigidity or rebound. Extremities: B/L Lower Ext shows no edema, both legs are warm to touch Neurology:  Non focal Psychiatric: Normal judgment and insight. Normal mood. Musculoskeletal:No digital cyanosis Skin:No Rash, warm and dry Wounds:N/A   Data Reviewed: I have personally reviewed following labs and imaging studies  CBC: Recent Labs  Lab 08/30/18 0904 08/31/18 0514  WBC 18.9* 13.6*  HGB 12.9* 13.2  HCT 37.9* 39.1  MCV 94.5 95.6  PLT 216 973   Basic Metabolic Panel: Recent Labs  Lab 08/30/18 1946 08/31/18 0514 09/01/18 0318 09/02/18 0655 09/03/18 0314 09/04/18 0449  NA  --  140 142 141 140 141  K  --  3.9 3.4* 3.4* 3.5 3.4*  CL  --  109 116* 110 110 110  CO2  --  20* 21* 21* 22 24  GLUCOSE  --  154* 151* 154* 151* 162*  BUN  --  10 7* 7* 9  11  CREATININE  --  1.10 1.07 1.13 1.15 1.09  CALCIUM  --  8.8* 8.5* 8.6* 8.6* 8.8*  PHOS 2.7 2.7  --   --   --   --    GFR: Estimated Creatinine Clearance: 78.1 mL/min (by C-G formula based on SCr of 1.09 mg/dL). Liver Function Tests: Recent Labs  Lab 08/31/18 0514 09/01/18 0318 09/02/18 0655 09/03/18 0314 09/04/18 0449  AST 1,392* 648* 421* 263* 175*  ALT 264* 196* 149* 116* 94*  ALKPHOS 80 73 65 60 61  BILITOT 0.6 0.7  0.8 0.8 0.6  PROT 6.8 6.1* 5.8* 5.5* 5.3*  ALBUMIN 3.2* 2.7* 2.5* 2.3* 2.2*   No results for input(s): LIPASE, AMYLASE in the last 168 hours. No results for input(s): AMMONIA in the last 168 hours. Coagulation Profile: No results for input(s): INR, PROTIME in the last 168 hours. Cardiac Enzymes: Recent Labs  Lab 08/31/18 0514 09/01/18 0318 09/02/18 0655 09/03/18 0314 09/04/18 0449  CKTOTAL >50,000* 43,446* 11,968* 10,075* 5,010*   BNP (last 3 results) No results for input(s): PROBNP in the last 8760 hours. HbA1C: No results for input(s): HGBA1C in the last 72 hours. CBG: Recent Labs  Lab 09/03/18 0738 09/03/18 1224 09/03/18 1632 09/03/18 2226 09/04/18 0803  GLUCAP 158* 130* 137* 173* 148*   Lipid Profile: No results for input(s): CHOL, HDL, LDLCALC, TRIG, CHOLHDL, LDLDIRECT in the last 72 hours. Thyroid Function Tests: No results for input(s): TSH, T4TOTAL, FREET4, T3FREE, THYROIDAB in the last 72 hours. Anemia Panel: No results for input(s): VITAMINB12, FOLATE, FERRITIN, TIBC, IRON, RETICCTPCT in the last 72 hours. Sepsis Labs: No results for input(s): PROCALCITON, LATICACIDVEN in the last 168 hours.  Recent Results (from the past 240 hour(s))  Urine culture     Status: None   Collection Time: 08/30/18 11:50 AM  Result Value Ref Range Status   Specimen Description URINE, CLEAN CATCH  Final   Special Requests NONE  Final   Culture   Final    NO GROWTH Performed at [facility], [address], [place], [place] [address]    Report Status 08/31/2018 FINAL  Final      Radiology Studies: No results found.    Scheduled Meds: . cholecalciferol  1,000 Units Oral Daily  . enoxaparin (LOVENOX) injection  40 mg Subcutaneous Q24H  . ferrous gluconate  324 mg Oral BID WC  . finasteride  5 mg Oral Daily  . gabapentin  300 mg Oral QHS  . Influenza vac split quadrivalent PF  0.5 mL Intramuscular Tomorrow-1000  . insulin aspart  0-5 Units Subcutaneous QHS  .  insulin aspart  0-9 Units Subcutaneous TID WC  . nicotine  21 mg Transdermal Daily  . paliperidone  3 mg Oral QHS  . polyethylene glycol  17 g Oral Daily  . senna-docusate  2 tablet Oral QHS  . sertraline  100 mg Oral QHS  . tamsulosin  0.4 mg Oral QPC supper  . traZODone  75 mg Oral QHS   Continuous Infusions: . sodium chloride 175 mL/hr at 09/04/18 0634     LOS: 5 days    Time spent: Total of 25 minutes spent with pt, greater than 50% of which was spent in discussion of  treatment, counseling and coordination of care    Oren Binet,  Pager: Text Page via [email or web address]   If 7PM-7AM, please contact night-coverage [email or web address] 09/04/2018, 11:06 AM

## 2018-09-05 ENCOUNTER — Inpatient Hospital Stay (HOSPITAL_COMMUNITY): Payer: Medicare Other

## 2018-09-05 LAB — COMPREHENSIVE METABOLIC PANEL
ALBUMIN: 1.4 g/dL — AB (ref 3.5–5.0)
ALT: 50 U/L — ABNORMAL HIGH (ref 0–44)
AST: 80 U/L — ABNORMAL HIGH (ref 15–41)
Alkaline Phosphatase: 37 U/L — ABNORMAL LOW (ref 38–126)
Anion gap: 6 (ref 5–15)
BILIRUBIN TOTAL: 0.6 mg/dL (ref 0.3–1.2)
BUN: 7 mg/dL — ABNORMAL LOW (ref 8–23)
CALCIUM: 5.5 mg/dL — AB (ref 8.9–10.3)
CO2: 16 mmol/L — ABNORMAL LOW (ref 22–32)
Chloride: 122 mmol/L — ABNORMAL HIGH (ref 98–111)
Creatinine, Ser: 0.7 mg/dL (ref 0.61–1.24)
GFR calc non Af Amer: >60 mL/min (ref >60)
GLUCOSE: 114 mg/dL — AB (ref 70–99)
POTASSIUM: 2.3 mmol/L — AB (ref 3.5–5.1)
Sodium: 144 mmol/L (ref 135–145)
TOTAL PROTEIN: 3.5 g/dL — AB (ref 6.5–8.1)

## 2018-09-05 LAB — MAGNESIUM
MAGNESIUM: 1 mg/dL — AB (ref 1.7–2.4)
Magnesium: 2.4 mg/dL (ref 1.7–2.4)

## 2018-09-05 LAB — BASIC METABOLIC PANEL
Anion gap: 8 (ref 5–15)
BUN: 10 mg/dL (ref 8–23)
CALCIUM: 8.8 mg/dL — AB (ref 8.9–10.3)
CO2: 24 mmol/L (ref 22–32)
CREATININE: 1.06 mg/dL (ref 0.61–1.24)
Chloride: 107 mmol/L (ref 98–111)
GFR calc Af Amer: >60 mL/min (ref >60)
GLUCOSE: 142 mg/dL — AB (ref 70–99)
POTASSIUM: 3.9 mmol/L (ref 3.5–5.1)
SODIUM: 139 mmol/L (ref 135–145)

## 2018-09-05 LAB — GLUCOSE, CAPILLARY
Glucose-Capillary: 148 mg/dL — ABNORMAL HIGH (ref 70–99)
Glucose-Capillary: 151 mg/dL — ABNORMAL HIGH (ref 70–99)
Glucose-Capillary: 133 mg/dL — ABNORMAL HIGH (ref 70–99)
Glucose-Capillary: 129 mg/dL — ABNORMAL HIGH (ref 70–99)

## 2018-09-05 LAB — CK: CK TOTAL: 2357 U/L — AB (ref 49–397)

## 2018-09-05 MED ORDER — POTASSIUM CHLORIDE CRYS ER 20 MEQ PO TBCR
40.0000 meq | EXTENDED_RELEASE_TABLET | Freq: Once | ORAL | Status: AC
  Administered 2018-09-05: 40 meq via ORAL
  Filled 2018-09-05: qty 2

## 2018-09-05 MED ORDER — MAGNESIUM SULFATE 4 GM/100ML IV SOLN
4.0000 g | Freq: Once | INTRAVENOUS | Status: AC
  Administered 2018-09-05: 4 g via INTRAVENOUS
  Filled 2018-09-05: qty 100

## 2018-09-05 MED ORDER — SODIUM CHLORIDE 0.9 % IV SOLN
2.0000 g | Freq: Once | INTRAVENOUS | Status: AC
  Administered 2018-09-05: 2 g via INTRAVENOUS
  Filled 2018-09-05: qty 20

## 2018-09-05 MED ORDER — IOHEXOL 300 MG/ML  SOLN
100.0000 mL | Freq: Once | INTRAMUSCULAR | Status: AC | PRN
  Administered 2018-09-05: 100 mL via INTRAVENOUS

## 2018-09-05 MED ORDER — POTASSIUM CHLORIDE 10 MEQ/100ML IV SOLN
10.0000 meq | INTRAVENOUS | Status: AC
  Administered 2018-09-05 (×4): 10 meq via INTRAVENOUS
  Filled 2018-09-05 (×3): qty 100

## 2018-09-05 MED ORDER — POTASSIUM CHLORIDE CRYS ER 20 MEQ PO TBCR: 40.0000 meq | EXTENDED_RELEASE_TABLET | Freq: Two times a day (BID) | ORAL | Status: DC

## 2018-09-05 NOTE — Progress Notes (Signed)
PROGRESS NOTE [facility]   NICKOLAUS BORDELON   KZS:[id number] DOB: February 04, 1956  DOA: 08/30/2018 PCP: Nolene Ebbs, MD   Brief Narrative:  [name] is a 62 year old male with PMH of schizophrenia, PVD, DM, and spinal stenosis. Patient presented to ED from home via EMS last week (08/30/2018) for acute onset, severe bilateral leg pain and weakness preventing him from being able to ambulate independently. Labs in the ED revealed CK > 50,000 and patient was admitted to inpatient for non-traumatic rhabdomyolysis.   Subjective: Patient reports resolution of pain, but persisting weakness in his thighs bilaterally. He states that he had a dull, constant 5/10 RUQ pain earlier this morning that has since resolved. Denies any chest pain, SOB, cough, nausea, constipation, or vomiting.    Assessment & Plan:   Rhabdomyolysis  Suspected to be secondary to recent cocaine abuse, particularly given the lack of any recent strenuous exercise, traumatic injury, or viral illness. Case discussed with neurology 09/03/2018. Continue IV fluids 125 cc/hour. Patient is slow to regain strength in his thighs bilaterally despite downtrending CK, which currently stands at 2,357. Patient was unable to independently stand from him seated position. Ordered another PT evaluation to be completed today and recommending continued therapy.   Transaminitis Secondary to rhabdomyolysis. AST and ALT this morning are 80 and 50 respectively. Continues to improve with downtrending CK.  Hypokalemia Potassium dropped from 3.4 yesterday to 2.3 this morning. Received 40 mEq K-dur. Recheck.   Hypocalcemia Calcium dropped from 8.8 yesterday to 5.5 this morning. Received 2 g IV calcium gluconate. Recheck.   Hypomagnesemia  Magnesium is 1.0. Received magnesium sulfate IV. Recheck.   Hyperchloremic metabolic acidosis Chloride 122 with 16 bicarb. Normal anion gap.   DM w/ neuropathy, non-insulin dependent Continue to hold  Metformin and manage with sliding-scale insulin. CBG 4 times daily. Continue carb-modified diet. Continue home gabapentin for neuropathy.   H/o lumbar spinal fusion secondary to spinal stenosis Imaging was ordered in ED to r/o myelopathy. MRI demonstrated fusion intact and without residual spinal stenosis.   BPH  Continue home meds - proscar, flomax.  Schizophrenia Stable. Continue home meds - paliperidone, trazodone.  Cocaine use Provided counsel for cessation of smoking crack cocaine.   Tobacco abuse Nicoderm patch 21 mg daily. Counseled on cessation.   DVT prophylaxis: Lovenox Code Status: Full-code Family Communication: None at bedside Disposition Plan: Will continue to observe inpatient as strength slowly returns   Consultants:   Physical Therapy  Procedures:   None  Antimicrobials:  None   Objective: Vitals:   09/04/18 0604 09/04/18 1557 09/04/18 2006 09/05/18 0607  BP: (!) 120/55 (!) 94/35 (!) 151/72 (!) 151/66  Pulse: 88 97 (!) 114 93  Resp: 17 18 18 18   Temp: 98.5 F (36.9 C) 99.5 F (37.5 C) 99.5 F (37.5 C) 99.5 F (37.5 C)  TempSrc: Oral Oral  Oral  SpO2: 96% 99% 98% 93%  Weight:      Height:        Intake/Output Summary (Last 24 hours) at 09/05/2018 1057 Last data filed at 09/05/2018 1038 Gross per 24 hour  Intake 4059.79 ml  Output 5350 ml  Net -1290.21 ml   Filed Weights   08/30/18 0842 08/30/18 1618  Weight: 85.3 kg 82.9 kg    Examination:  General exam: Patient was sitting upright in chair on exam. Respiratory system: Clear to auscultation. Cardiovascular system: S1 & S2 heard. No JVD or murmurs appreciated. Mildly tachycardic.  Gastrointestinal system: Abdomen is soft, non-distended,  and nontender. No organomegaly or masses felt. Normal bowel sounds heard. Central nervous system: Alert and oriented. Resting tremor right hand. Bradykinesia. Extremities: No pedal edema. Mild tenderness to palpation in thighs. Warm to  touch. Patient was able to extend knees and flex hips bilaterally, but was unable to stand up out of his chair independently.  Skin: No rashes, lesions, or ulcers. Psychiatry: Judgment and insight appear normal. Mood & affect appropriate.    Data Reviewed: I have personally reviewed following labs and imaging studies  CBC: Recent Labs  Lab 08/30/18 0904 08/31/18 0514  WBC 18.9* 13.6*  HGB 12.9* 13.2  HCT 37.9* 39.1  MCV 94.5 95.6  PLT 216 811   Basic Metabolic Panel: Recent Labs  Lab 08/30/18 1946 08/31/18 0514 09/01/18 0318 09/02/18 0655 09/03/18 0314 09/04/18 0449 09/05/18 0415  NA  --  140 142 141 140 141 144  K  --  3.9 3.4* 3.4* 3.5 3.4* 2.3*  CL  --  109 116* 110 110 110 122*  CO2  --  20* 21* 21* 22 24 16*  GLUCOSE  --  154* 151* 154* 151* 162* 114*  BUN  --  10 7* 7* 9 11 7*  CREATININE  --  1.10 1.07 1.13 1.15 1.09 0.70  CALCIUM  --  8.8* 8.5* 8.6* 8.6* 8.8* 5.5*  MG  --   --   --   --   --   --  1.0*  PHOS 2.7 2.7  --   --   --   --   --    GFR: Estimated Creatinine Clearance: 106.4 mL/min (by C-G formula based on SCr of 0.7 mg/dL). Liver Function Tests: Recent Labs  Lab 09/01/18 0318 09/02/18 0655 09/03/18 0314 09/04/18 0449 09/05/18 0415  AST 648* 421* 263* 175* 80*  ALT 196* 149* 116* 94* 50*  ALKPHOS 73 65 60 61 37*  BILITOT 0.7 0.8 0.8 0.6 0.6  PROT 6.1* 5.8* 5.5* 5.3* 3.5*  ALBUMIN 2.7* 2.5* 2.3* 2.2* 1.4*   No results for input(s): LIPASE, AMYLASE in the last 168 hours. No results for input(s): AMMONIA in the last 168 hours. Coagulation Profile: No results for input(s): INR, PROTIME in the last 168 hours. Cardiac Enzymes: Recent Labs  Lab 09/01/18 0318 09/02/18 0655 09/03/18 0314 09/04/18 0449 09/05/18 0415  CKTOTAL 43,446* 11,968* 10,075* 5,010* 2,357*   BNP (last 3 results) No results for input(s): PROBNP in the last 8760 hours. HbA1C: No results for input(s): HGBA1C in the last 72 hours. CBG: Recent Labs  Lab  09/04/18 0803 09/04/18 1234 09/04/18 1728 09/04/18 2111 09/05/18 0817  GLUCAP 148* 164* 131* 171* 148*   Lipid Profile: No results for input(s): CHOL, HDL, LDLCALC, TRIG, CHOLHDL, LDLDIRECT in the last 72 hours. Thyroid Function Tests: No results for input(s): TSH, T4TOTAL, FREET4, T3FREE, THYROIDAB in the last 72 hours. Anemia Panel: No results for input(s): VITAMINB12, FOLATE, FERRITIN, TIBC, IRON, RETICCTPCT in the last 72 hours. Sepsis Labs: No results for input(s): PROCALCITON, LATICACIDVEN in the last 168 hours.  Recent Results (from the past 240 hour(s))  Urine culture     Status: None   Collection Time: 08/30/18 11:50 AM  Result Value Ref Range Status   Specimen Description URINE, CLEAN CATCH  Final   Special Requests NONE  Final   Culture   Final    NO GROWTH Performed at [facility], [address] [place], [place] [address]    Report Status 08/31/2018 FINAL  Final  Radiology Studies: No results found.    Scheduled Meds: . cholecalciferol  1,000 Units Oral Daily  . enoxaparin (LOVENOX) injection  40 mg Subcutaneous Q24H  . ferrous gluconate  324 mg Oral BID WC  . finasteride  5 mg Oral Daily  . gabapentin  300 mg Oral QHS  . Influenza vac split quadrivalent PF  0.5 mL Intramuscular Tomorrow-1000  . insulin aspart  0-5 Units Subcutaneous QHS  . insulin aspart  0-9 Units Subcutaneous TID WC  . nicotine  21 mg Transdermal Daily  . paliperidone  3 mg Oral QHS  . polyethylene glycol  17 g Oral Daily  . senna-docusate  2 tablet Oral QHS  . sertraline  100 mg Oral QHS  . tamsulosin  0.4 mg Oral QPC supper  . traZODone  75 mg Oral QHS   Continuous Infusions: . sodium chloride 75 mL/hr at 09/05/18 0703  . magnesium sulfate 1 - 4 g bolus IVPB 4 g (09/05/18 1005)  . potassium chloride 10 mEq (09/05/18 1038)     LOS: 6 days    Time spent: Total of 25 minutes spent with pt, greater than 50% of which was spent in discussion of  treatment,  counseling and coordination of care    Krista Blue, PA-S Pager: Text Page via [email or web address]   If 7PM-7AM, please contact night-coverage [email or web address] 09/05/2018, 10:57 AM   Note - This record has been created using [employer]. Chart creation errors have been sought, but may not always have been located. Such creation errors do not reflect on the standard of medical care.

## 2018-09-05 NOTE — Progress Notes (Signed)
Called into patient's room by physical therapy. Patient complaining of RUQ pain, which was limiting his performance with PT. Dr. Sloan Leiter paged and at bedside to see patient. STAT CT ordered and patient made NPO.

## 2018-09-05 NOTE — Progress Notes (Signed)
Paged [facility] provider Jeannette Corpus about K 2.3 and Ca 5.5.  Received orders to give 2g IV calcium gluconate now and 40 mEq potassium PO at 1000.    Re-paged to get clarification on orders; received modified orders for IV Potassium 10 mEq x4 @ 0700, calcium gluconate orders unchanged.

## 2018-09-05 NOTE — Progress Notes (Signed)
CRITICAL VALUE ALERT  Critical Value:  Potassium 2.3, Calcium 5.5  Date & Time Notied:  09/05/18 0615  Provider Notified: Kennon Holter, NP  Orders Received/Actions taken: Awaiting response.

## 2018-09-05 NOTE — Care Management Important Message (Signed)
Important Message  Patient Details  Name: [name] MRN: [id number] Date of Birth: 12-02-1955   Medicare Important Message Given:  Yes    Kersti Scavone [address] 09/05/2018, 8:48 AM

## 2018-09-05 NOTE — Progress Notes (Signed)
Informed by RN-patient complaining of severe RUQ Pain-evaluated patient-apparently has been having intermittent RUQ pain since yesterday afternoon. No pain this morning when I saw him.   O/E RUQ Tenderness mostly-some gaurding-but rest of the abd is soft.  Imp R/o Cholecystitis  Plan NPO CT Abd/pelvis Will follow

## 2018-09-05 NOTE — Progress Notes (Signed)
Physical Therapy Treatment Patient Details Name: [name] MRN: [id number] DOB: 01-06-1956 Today's Date: 09/05/2018    History of Present Illness Pt is a 62 y.o. male admitted 08/30/18 with BLE pain, weakness, and inability to walk; worke up for rhabdomyolysis of unclear cause. PMH includes schizophrenia, PVD, spinal stenosis, DM, L5-S1 PLIF (05/2018).    PT Comments    Patient seen for activity progression, continues to have significant difficulty with transfers and activity tolerance. Today patient is endorsing significant pain in right lower quadrant limiting mobility attempts. Patient extremely distended and firm. Tender to touch. Nursing notified.  Continue to feel patient is not safe for d/c home and will need SNF.  Follow Up Recommendations  SNF;Supervision for mobility/OOB     Equipment Recommendations  Wheelchair (measurements PT)    Recommendations for Other Services       Precautions / Restrictions Precautions Precautions: Fall Restrictions Weight Bearing Restrictions: No    Mobility  Bed Mobility               General bed mobility comments: received in chair  Transfers Overall transfer level: Needs assistance Equipment used: Rolling walker (2 wheeled) Transfers: Sit to/from Stand Sit to Stand: Mod assist;+2 physical assistance;From elevated surface         General transfer comment: Performed transfers x4 during session, extremely limited by right light quadrant pain during power up and in standing  Ambulation/Gait Ambulation/Gait assistance: Min assist Gait Distance (Feet): 18 Feet Assistive device: Rolling walker (2 wheeled) Gait Pattern/deviations: Step-to pattern     General Gait Details: extremely limited by right lower quadrant pain today   Stairs             Wheelchair Mobility    Modified Rankin (Stroke Patients Only)       Balance Overall balance assessment: Needs assistance   Sitting balance-Leahy Scale: Fair        Standing balance-Leahy Scale: Poor Standing balance comment: heavy reliance on RW                            Cognition Arousal/Alertness: Awake/alert Behavior During Therapy: Flat affect Overall Cognitive Status: No family/caregiver present to determine baseline cognitive functioning                                        Exercises      General Comments        Pertinent Vitals/Pain Pain Assessment: Faces Faces Pain Scale: Hurts whole lot Pain Location: sharp pain in right lower quadrant Pain Descriptors / Indicators: Sore Pain Intervention(s): Monitored during session    Home Living                      Prior Function            PT Goals (current goals can now be found in the care plan section) Acute Rehab PT Goals Patient Stated Goal: Get a wheelchair to use at home PT Goal Formulation: With patient Time For Goal Achievement: 09/14/18 Potential to Achieve Goals: Fair Progress towards PT goals: Progressing toward goals    Frequency    Min 2X/week      PT Plan Current plan remains appropriate    Co-evaluation              AM-PAC PT "6 Clicks" Daily Activity  Outcome Measure  Difficulty turning over in bed (including adjusting bedclothes, sheets and blankets)?: Unable Difficulty moving from lying on back to sitting on the side of the bed? : Unable Difficulty sitting down on and standing up from a chair with arms (e.g., wheelchair, bedside commode, etc,.)?: Unable Help needed moving to and from a bed to chair (including a wheelchair)?: A Lot Help needed walking in hospital room?: Total Help needed climbing 3-5 steps with a railing? : Total 6 Click Score: 7    End of Session Equipment Utilized During Treatment: Gait belt Activity Tolerance: Patient limited by fatigue;Patient limited by pain Patient left: in chair;with call bell/phone within reach;with chair alarm set Nurse Communication: Mobility status PT  Visit Diagnosis: Other abnormalities of gait and mobility (R26.89);Muscle weakness (generalized) (M62.81)     Time: 1550-1610 PT Time Calculation (min) (ACUTE ONLY): 20 min  Charges:  $Gait Training: 8-22 mins                     Alben Deeds, PT DPT  Board Certified Neurologic Specialist [place] Pager [phone] Office [place] 09/05/2018, 4:19 PM

## 2018-09-06 ENCOUNTER — Inpatient Hospital Stay (HOSPITAL_COMMUNITY): Payer: Medicare Other

## 2018-09-06 DIAGNOSIS — R1011 Right upper quadrant pain: Secondary | ICD-10-CM

## 2018-09-06 DIAGNOSIS — E876 Hypokalemia: Secondary | ICD-10-CM

## 2018-09-06 LAB — COMPREHENSIVE METABOLIC PANEL
ALT: 72 U/L — AB (ref 0–44)
ANION GAP: 7 (ref 5–15)
AST: 109 U/L — ABNORMAL HIGH (ref 15–41)
Albumin: 2.3 g/dL — ABNORMAL LOW (ref 3.5–5.0)
Alkaline Phosphatase: 63 U/L (ref 38–126)
BUN: 13 mg/dL (ref 8–23)
CHLORIDE: 108 mmol/L (ref 98–111)
CO2: 25 mmol/L (ref 22–32)
CREATININE: 1.04 mg/dL (ref 0.61–1.24)
Calcium: 8.6 mg/dL — ABNORMAL LOW (ref 8.9–10.3)
GFR calc Af Amer: >60 mL/min (ref >60)
GFR calc non Af Amer: >60 mL/min (ref >60)
Glucose, Bld: 152 mg/dL — ABNORMAL HIGH (ref 70–99)
POTASSIUM: 3.8 mmol/L (ref 3.5–5.1)
SODIUM: 140 mmol/L (ref 135–145)
Total Bilirubin: 0.8 mg/dL (ref 0.3–1.2)
Total Protein: 5.6 g/dL — ABNORMAL LOW (ref 6.5–8.1)

## 2018-09-06 LAB — GLUCOSE, CAPILLARY
Glucose-Capillary: 146 mg/dL — ABNORMAL HIGH (ref 70–99)
Glucose-Capillary: 137 mg/dL — ABNORMAL HIGH (ref 70–99)
Glucose-Capillary: 150 mg/dL — ABNORMAL HIGH (ref 70–99)
Glucose-Capillary: 159 mg/dL — ABNORMAL HIGH (ref 70–99)

## 2018-09-06 LAB — CK: Total CK: 3160 U/L — ABNORMAL HIGH (ref 49–397)

## 2018-09-06 LAB — MAGNESIUM: MAGNESIUM: 2 mg/dL (ref 1.7–2.4)

## 2018-09-06 NOTE — Progress Notes (Signed)
PROGRESS NOTE [facility]   [name]   GYB:[id number] DOB: 12/13/1955  DOA: 08/30/2018 PCP: Nolene Ebbs, MD   Brief Narrative:  [name] is a 62 year old male with PMH of schizophrenia, PVD, DM, and spinal stenosis. Patient presented to ED from home via EMS last week (08/30/2018) for acute onset, severe bilateral leg pain and weakness preventing him from being able to ambulate independently. Labs in the ED revealed CK > 50,000 and patient was admitted to inpatient for non-traumatic rhabdomyolysis.  Subjective: Patient states that his RUQ abdominal pain from yesterday has resolved. Denies any abdominal pain, chest pain, SOB, cough, nausea, vomiting, urinary symptoms, fever, or chills. No bowel movement since yesterday and admits slight constipation. Continues to pass gas. Reportedly was able to stand independently today.     Assessment & Plan:  Rhabdomyolysis  Suspected to be secondary to recent cocaine abuse, particularly given the lack of any recent strenuous exercise, traumatic injury, or viral illness. Case discussed with neurology 09/03/2018. Continue IV fluids 125 cc/hour. Patient is slow to regain strength in his thighs bilaterally despite downtrending CK, which in the past 24 hours has broken downtrend and increased from 2,357 to 3,160. Do not suspect polymyositis or dermatomyositis due to acute onset of patient's bilateral leg pain and weakness. Recommending continued, daily PT. Will consider muscle biopsy if weakness continues to persist into next week.   Transaminitis Secondary to rhabdomyolysis. AST and ALT has broken downtrend and increased to 109 and 72 respectively, in congruence with slight increase in CK.   RUQ abdominal pain Patient had reported a 5/10 constant, dull abdominal pain yesterday morning that he subsequently reported had resolved spontaneously, but evidently it returned later that day during PT and was concerning for cholecystitis. CT  abdomen and pelvis last evening was negative for any acute abnormality. This morning the patient states that the RUQ abdominal pain has resolved.  Hypokalemia, hypocalcemia, hypomagnesemia Repleted and resolved. Continue to monitor.    DM w/ neuropathy, non-insulin dependent Continue to hold Metformin and manage with sliding-scale insulin. CBG 4 times daily. Continue carb-modified diet. Continue home gabapentin for neuropathy.  H/o lumbar spinal fusion secondary to spinal stenosis Imaging was ordered in ED to r/o myelopathy. MRI demonstrated fusion intact and without residual spinal stenosis.  BPH Continue home meds - proscar, flomax.  Schizophrenia Stable. Continue home meds - paliperidone, trazodone.  Cocaine use Provided counsel for cessation of smoking crack cocaine.   Tobacco abuse Nicoderm patch 21 mg daily. Counseled on cessation.   DVT prophylaxis: Lovenox Code Status: Full-code Family Communication: None at bedside Disposition Plan: Will continue to observe inpatient as strength slowly returns   Consultants:   Physical Therapy  Procedures:   None  Antimicrobials:  None   Objective: Vitals:   09/05/18 0607 09/05/18 1334 09/05/18 2135 09/06/18 0548  BP: (!) 151/66 (!) 124/56 (!) 142/60 139/68  Pulse: 93 100 (!) 105 (!) 132  Resp: 18 18 17 17   Temp: 99.5 F (37.5 C) 99.3 F (37.4 C) 99.5 F (37.5 C) 99.5 F (37.5 C)  TempSrc: Oral Oral Oral Oral  SpO2: 93% 100% 96% 93%  Weight:      Height:        Intake/Output Summary (Last 24 hours) at 09/06/2018 0821 Last data filed at 09/06/2018 0555 Gross per 24 hour  Intake 2191.96 ml  Output 5000 ml  Net -2808.04 ml   Filed Weights   08/30/18 0842 08/30/18 1618  Weight: 85.3 kg 82.9 kg  Examination:  General exam: Patient sitting upright in chair comfortably. HEENT: AC/AT, PERRLA, OP moist and clear Respiratory system: Clear to auscultation. No wheezes, crackles, or  rhonchi Cardiovascular system: S1 & S2 heard, RRR. No JVD, murmurs, rubs or gallops Gastrointestinal system: Abdomen is distended. Soft and nontender. No organomegaly or masses felt. Normal bowel sounds heard. Central nervous system: Alert and oriented. No focal neurological deficits. Extremities: No pedal edema. Symmetric. Patient able to extend knees and flex hips bilaterally against resistance.  Skin: No rashes, lesions, or ulcers Psychiatry: Judgment and insight appear normal. Mood & affect appropriate.    Data Reviewed: I have personally reviewed following labs and imaging studies  CBC: Recent Labs  Lab 08/30/18 0904 08/31/18 0514  WBC 18.9* 13.6*  HGB 12.9* 13.2  HCT 37.9* 39.1  MCV 94.5 95.6  PLT 216 841   Basic Metabolic Panel: Recent Labs  Lab 08/30/18 1946 08/31/18 0514  09/03/18 0314 09/04/18 0449 09/05/18 0415 09/05/18 1439 09/06/18 0242  NA  --  140   < > 140 141 144 139 140  K  --  3.9   < > 3.5 3.4* 2.3* 3.9 3.8  CL  --  109   < > 110 110 122* 107 108  CO2  --  20*   < > 22 24 16* 24 25  GLUCOSE  --  154*   < > 151* 162* 114* 142* 152*  BUN  --  10   < > 9 11 7* 10 13  CREATININE  --  1.10   < > 1.15 1.09 0.70 1.06 1.04  CALCIUM  --  8.8*   < > 8.6* 8.8* 5.5* 8.8* 8.6*  MG  --   --   --   --   --  1.0* 2.4 2.0  PHOS 2.7 2.7  --   --   --   --   --   --    < > = values in this interval not displayed.   GFR: Estimated Creatinine Clearance: 81.9 mL/min (by C-G formula based on SCr of 1.04 mg/dL). Liver Function Tests: Recent Labs  Lab 09/02/18 0655 09/03/18 0314 09/04/18 0449 09/05/18 0415 09/06/18 0242  AST 421* 263* 175* 80* 109*  ALT 149* 116* 94* 50* 72*  ALKPHOS 65 60 61 37* 63  BILITOT 0.8 0.8 0.6 0.6 0.8  PROT 5.8* 5.5* 5.3* 3.5* 5.6*  ALBUMIN 2.5* 2.3* 2.2* 1.4* 2.3*   No results for input(s): LIPASE, AMYLASE in the last 168 hours. No results for input(s): AMMONIA in the last 168 hours. Coagulation Profile: No results for input(s):  INR, PROTIME in the last 168 hours. Cardiac Enzymes: Recent Labs  Lab 09/02/18 0655 09/03/18 0314 09/04/18 0449 09/05/18 0415 09/06/18 0242  CKTOTAL 11,968* 10,075* 5,010* 2,357* 3,160*   BNP (last 3 results) No results for input(s): PROBNP in the last 8760 hours. HbA1C: No results for input(s): HGBA1C in the last 72 hours. CBG: Recent Labs  Lab 09/05/18 0817 09/05/18 1223 09/05/18 1737 09/05/18 2132 09/06/18 0801  GLUCAP 148* 151* 133* 129* 146*   Lipid Profile: No results for input(s): CHOL, HDL, LDLCALC, TRIG, CHOLHDL, LDLDIRECT in the last 72 hours. Thyroid Function Tests: No results for input(s): TSH, T4TOTAL, FREET4, T3FREE, THYROIDAB in the last 72 hours. Anemia Panel: No results for input(s): VITAMINB12, FOLATE, FERRITIN, TIBC, IRON, RETICCTPCT in the last 72 hours. Sepsis Labs: No results for input(s): PROCALCITON, LATICACIDVEN in the last 168 hours.  Recent Results (from the past 240  hour(s))  Urine culture     Status: None   Collection Time: 08/30/18 11:50 AM  Result Value Ref Range Status   Specimen Description URINE, CLEAN CATCH  Final   Special Requests NONE  Final   Culture   Final    NO GROWTH Performed at [facility], [address] [place], [place] [address]    Report Status 08/31/2018 FINAL  Final      Radiology Studies: Ct Abdomen Pelvis W Contrast  Result Date: 09/05/2018 CLINICAL DATA:  Right upper quadrant abdominal pain with a clinical suspicion of cholecystitis. EXAM: CT ABDOMEN AND PELVIS WITH CONTRAST TECHNIQUE: Multidetector CT imaging of the abdomen and pelvis was performed using the standard protocol following bolus administration of intravenous contrast. CONTRAST:  139mL OMNIPAQUE IOHEXOL 300 MG/ML  SOLN COMPARISON:  Right upper quadrant abdomen ultrasound dated 07/02/2016. Abdomen and pelvis CT dated 10/14/2007. FINDINGS: Lower chest: Bibasilar atelectasis and possible pneumonia. Small right pleural effusion and minimal left  pleural effusion. Small pericardial effusion with a maximum thickness of 10 mm. Hepatobiliary: No focal liver abnormality is seen. No gallstones, gallbladder wall thickening, or biliary dilatation. Pancreas: Unremarkable. No pancreatic ductal dilatation or surrounding inflammatory changes. Spleen: Normal in size without focal abnormality. Adrenals/Urinary Tract: Interval 1.1 cm rounded fat density mass in the posterior aspect of the upper pole of the left kidney. A more laterally located left renal cyst is unchanged. Normal appearing right kidney, ureters, urinary bladder and adrenal glands. Stomach/Bowel: Unremarkable stomach, small bowel and colon. Surgically absent appendix. Vascular/Lymphatic: Atheromatous arterial calcifications without aneurysm. No enlarged lymph nodes. Reproductive: Prostate is unremarkable. Other: Ventricular peritoneal shunt catheter tip in the left upper pelvis anteriorly. No associated fluid collection. Musculoskeletal: Pedicle screw and rod fixation at the L2 through S1 levels with interbody fusion at the L4-5 and L5-S1 level. There is a vacuum phenomenon in the disc space and endplate irregularity and sclerosis at the L5-S1 level. IMPRESSION: 1. No acute abnormality in the abdomen or pelvis. 2. Bibasilar atelectasis and possible pneumonia. 3. Small right pleural effusion and minimal left pleural effusion. 4. Small pericardial effusion. 5. 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 Electronically Signed   By: [name] M.D.   On: 09/05/2018 21:12      Scheduled Meds: . cholecalciferol  1,000 Units Oral Daily  . enoxaparin (LOVENOX) injection  40 mg Subcutaneous Q24H  . ferrous gluconate  324 mg Oral BID WC  . finasteride  5 mg Oral Daily  . gabapentin  300 mg Oral QHS  . Influenza vac split quadrivalent PF  0.5 mL Intramuscular Tomorrow-1000  . insulin aspart  0-5 Units Subcutaneous QHS  . insulin aspart  0-9 Units Subcutaneous TID WC  .  nicotine  21 mg Transdermal Daily  . paliperidone  3 mg Oral QHS  . polyethylene glycol  17 g Oral Daily  . senna-docusate  2 tablet Oral QHS  . sertraline  100 mg Oral QHS  . tamsulosin  0.4 mg Oral QPC supper  . traZODone  75 mg Oral QHS   Continuous Infusions: . sodium chloride 75 mL/hr at 09/06/18 0555     LOS: 7 days    Time spent: Total of 20 minutes spent with pt, greater than 50% of which was spent in discussion of  treatment, counseling and coordination of care    Krista Blue, PA-S Pager: Text Page via [email or web address]   If 7PM-7AM, please contact night-coverage [email or web address] 09/06/2018, 8:21 AM  Note - This record has been created using [employer]. Chart creation errors have been sought, but may not always have been located. Such creation errors do not reflect on the standard of medical care.

## 2018-09-06 NOTE — Progress Notes (Signed)
CSW spoke with patient again about SNF placement. CSW stressed importance of patient getting the help he needs before returning home. Patient reports agreement for [employer] to contact [employer] to see if they can get information on the payor source for SNF since there is an open insurance claim. Patient stresses that he does not want to "mess up his claim" and he does not know the claim number.   Richard Locus Rupert Azzara LCSW [phone]

## 2018-09-06 NOTE — Plan of Care (Signed)

## 2018-09-07 ENCOUNTER — Inpatient Hospital Stay (HOSPITAL_COMMUNITY): Payer: Medicare Other

## 2018-09-07 DIAGNOSIS — I340 Nonrheumatic mitral (valve) insufficiency: Secondary | ICD-10-CM

## 2018-09-07 LAB — CBC
HCT: 27.7 % — ABNORMAL LOW (ref 39.0–52.0)
Hemoglobin: 9 g/dL — ABNORMAL LOW (ref 13.0–17.0)
MCH: 30.7 pg (ref 26.0–34.0)
MCHC: 32.5 g/dL (ref 30.0–36.0)
MCV: 94.5 fL (ref 80.0–100.0)
PLATELETS: 308 10*3/uL (ref 150–400)
RBC: 2.93 MIL/uL — AB (ref 4.22–5.81)
RDW: 11.1 % — AB (ref 11.5–15.5)
WBC: 10 10*3/uL (ref 4.0–10.5)
nRBC: 0 % (ref 0.0–0.2)

## 2018-09-07 LAB — COMPREHENSIVE METABOLIC PANEL
ALT: 65 U/L — ABNORMAL HIGH (ref 0–44)
AST: 91 U/L — ABNORMAL HIGH (ref 15–41)
Albumin: 2.6 g/dL — ABNORMAL LOW (ref 3.5–5.0)
Alkaline Phosphatase: 58 U/L (ref 38–126)
Anion gap: 9 (ref 5–15)
BUN: 11 mg/dL (ref 8–23)
CHLORIDE: 107 mmol/L (ref 98–111)
CO2: 22 mmol/L (ref 22–32)
Calcium: 9 mg/dL (ref 8.9–10.3)
Creatinine, Ser: 1.03 mg/dL (ref 0.61–1.24)
Glucose, Bld: 191 mg/dL — ABNORMAL HIGH (ref 70–99)
POTASSIUM: 3.9 mmol/L (ref 3.5–5.1)
SODIUM: 138 mmol/L (ref 135–145)
Total Bilirubin: 0.7 mg/dL (ref 0.3–1.2)
Total Protein: 6.2 g/dL — ABNORMAL LOW (ref 6.5–8.1)

## 2018-09-07 LAB — GLUCOSE, CAPILLARY
Glucose-Capillary: 159 mg/dL — ABNORMAL HIGH (ref 70–99)
Glucose-Capillary: 86 mg/dL (ref 70–99)
Glucose-Capillary: 138 mg/dL — ABNORMAL HIGH (ref 70–99)
Glucose-Capillary: 128 mg/dL — ABNORMAL HIGH (ref 70–99)
Glucose-Capillary: 164 mg/dL — ABNORMAL HIGH (ref 70–99)

## 2018-09-07 LAB — ECHOCARDIOGRAM COMPLETE
HEIGHTINCHES: 72 in
WEIGHTICAEL: 2924.18 [oz_av]

## 2018-09-07 LAB — CK: CK TOTAL: 2272 U/L — AB (ref 49–397)

## 2018-09-07 NOTE — Progress Notes (Addendum)
2pm-[facility] would need for patient to close his open car insurance claim in order to accept him. CSW reaching out to [facility] to see if there is anything they can do differently.   11:15am-[facility] unable to access any info on the patient without a claim number. CSW will contact other facilities to see if any of them would be willing to take patient without speaking with [employer].   Percell Locus Bria Sparr LCSW [phone]

## 2018-09-07 NOTE — Progress Notes (Signed)
PROGRESS NOTE [facility]   ENOS MUHL   WUJ:[id number] DOB: 1956/05/31  DOA: 08/30/2018 PCP: Nolene Ebbs, MD   Brief Narrative:  Richard Mortellaro Tuckeris a 62 year old male with PMH of schizophrenia, PVD, DM, and spinal stenosis presented with rhabdomyolysis and bilateral proximal thigh myopathy in the setting of cocaine use . See below for further details  Subjective: No pain-sitting comfortably at bedside.  No chest pain.  Was able to stand yesterday-claims he feels tight in his thighs today-he was just about able to lift himself off the chair but was not able to stand upright.    He denies any cough or shortness of breath.  Assessment & Plan: Rhabdomyolysis with bilateral muscle myopathy: Suspect secondary to cocaine use-no history of vigorous exercise etc. Due to sudden onset do not suspect polymyositis or dermatomyositis-has no skin findings suggestive of dermatomyositis. CK levels are downtrending-but seems to have plateaued in the 2000's-3000's range.  His overall myopathy/muscle weakness seems to be improving-although slowly.  I have discussed this case with Dr. Kathrynn Speed a few days back-he advised continued supportive care and PT evaluation.  I have spoken at length with the patient-if he does not improve the next few weeks-he is aware that he may need further work-up including a muscle biopsy.  Social worker following for SNF placement.   Hypokalemia, hypocalcemia, hypomagnesemia: Repleted-continue to check electrolytes periodically.  Transaminitis:Secondary to rhabdomyolysis-continues to slowly downtrend.  Intermittent RUQ pain: No abdominal pain today-abdominal exam is completely benign.  CT scan of the abdomen on 10/7 did not show any acute etiologies.  ? PNA: Has possible infiltrates/atelectasis on x-ray-he does not have any symptoms-monitor off antimicrobial therapy.  Small pericardial effusion seen on CT scan: Awaiting echocardiogram.  He has no  clinical features suggestive of tamponade at this time.  Constipation:Resolved-continue MiraLAX and senna.  DM w/ neuropathy, non-insulin dependent:CBG stable with SSI  NFA:[id number] Proscar and Flomax.  Schizophrenia: Stable continue paliperidone and trazodone  H/o lumbar spinal fusion secondary to spinal stenosis:I MRIon admissiondemonstrated fusion intact and without residual spinal stenosis.  Cocaine QIO:[id number] counsel for cessation of smoking crack cocaine.   Tobacco abuse:Counseled-continue transdermal nicotine  DVT prophylaxis: Lovenox Code Status: Full-code Family Communication: None at bedside Disposition Plan: Probably home or SNF early next week.   Consultants:   Physical therapy  Procedures:   None  Antimicrobials:  None   Objective: Vitals:   09/06/18 0548 09/06/18 1248 09/06/18 2239 09/07/18 0531  BP: 139/68 (!) 126/58 126/61 122/61  Pulse: (!) 132 (!) 101 97 (!) 107  Resp: 17 18  17   Temp: 99.5 F (37.5 C) 99.4 F (37.4 C)  98.8 F (37.1 C)  TempSrc: Oral Oral  Oral  SpO2: 93% 97%  95%  Weight:      Height:        Intake/Output Summary (Last 24 hours) at 09/07/2018 1403 Last data filed at 09/07/2018 1333 Gross per 24 hour  Intake 1853.4 ml  Output 7000 ml  Net -5146.6 ml   Filed Weights   08/30/18 0842 08/30/18 1618  Weight: 85.3 kg 82.9 kg    Examination: General appearance:Awake, alert, not in any distress.  Eyes:no scleral icterus. HEENT: Atraumatic and Normocephalic Neck: supple, no JVD. Resp:Good air entry bilaterally,no rales or rhonchi CVS: S1 S2 regular, no murmurs.  GI: Bowel sounds present, Non tender and not distended with no gaurding, rigidity or rebound. Extremities: B/L Lower Ext shows no edema, both legs are warm to touch Neurology:  Non focal Psychiatric:  Normal judgment and insight. Normal mood. Musculoskeletal:No digital cyanosis Skin:No Rash, warm and dry Wounds:N/A  Data Reviewed: I have  personally reviewed following labs and imaging studies  CBC: Recent Labs  Lab 09/07/18 0526  WBC 10.0  HGB 9.0*  HCT 27.7*  MCV 94.5  PLT 332   Basic Metabolic Panel: Recent Labs  Lab 09/04/18 0449 09/05/18 0415 09/05/18 1439 09/06/18 0242 09/07/18 0526  NA 141 144 139 140 138  K 3.4* 2.3* 3.9 3.8 3.9  CL 110 122* 107 108 107  CO2 24 16* 24 25 22   GLUCOSE 162* 114* 142* 152* 191*  BUN 11 7* 10 13 11   CREATININE 1.09 0.70 1.06 1.04 1.03  CALCIUM 8.8* 5.5* 8.8* 8.6* 9.0  MG  --  1.0* 2.4 2.0  --    GFR: Estimated Creatinine Clearance: 82.7 mL/min (by C-G formula based on SCr of 1.03 mg/dL). Liver Function Tests: Recent Labs  Lab 09/03/18 0314 09/04/18 0449 09/05/18 0415 09/06/18 0242 09/07/18 0526  AST 263* 175* 80* 109* 91*  ALT 116* 94* 50* 72* 65*  ALKPHOS 60 61 37* 63 58  BILITOT 0.8 0.6 0.6 0.8 0.7  PROT 5.5* 5.3* 3.5* 5.6* 6.2*  ALBUMIN 2.3* 2.2* 1.4* 2.3* 2.6*   No results for input(s): LIPASE, AMYLASE in the last 168 hours. No results for input(s): AMMONIA in the last 168 hours. Coagulation Profile: No results for input(s): INR, PROTIME in the last 168 hours. Cardiac Enzymes: Recent Labs  Lab 09/03/18 0314 09/04/18 0449 09/05/18 0415 09/06/18 0242 09/07/18 0526  CKTOTAL 10,075* 5,010* 2,357* 3,160* 2,272*   BNP (last 3 results) No results for input(s): PROBNP in the last 8760 hours. HbA1C: No results for input(s): HGBA1C in the last 72 hours. CBG: Recent Labs  Lab 09/06/18 1151 09/06/18 1710 09/06/18 2156 09/07/18 0755 09/07/18 1323  GLUCAP 137* 150* 159* 159* 86   Lipid Profile: No results for input(s): CHOL, HDL, LDLCALC, TRIG, CHOLHDL, LDLDIRECT in the last 72 hours. Thyroid Function Tests: No results for input(s): TSH, T4TOTAL, FREET4, T3FREE, THYROIDAB in the last 72 hours. Anemia Panel: No results for input(s): VITAMINB12, FOLATE, FERRITIN, TIBC, IRON, RETICCTPCT in the last 72 hours. Sepsis Labs: No results for input(s):  PROCALCITON, LATICACIDVEN in the last 168 hours.  Recent Results (from the past 240 hour(s))  Urine culture     Status: None   Collection Time: 08/30/18 11:50 AM  Result Value Ref Range Status   Specimen Description URINE, CLEAN CATCH  Final   Special Requests NONE  Final   Culture   Final    NO GROWTH Performed at [facility], [address], [place], [place] [address]    Report Status 08/31/2018 FINAL  Final      Radiology Studies: Dg Chest 2 View  Result Date: 09/06/2018 CLINICAL DATA:  Pneumonia EXAM: CHEST - 2 VIEW COMPARISON:  03/23/2018 FINDINGS: Mild cardiomegaly again noted. Bibasilar opacities are now noted. There may be a trace RIGHT pleural effusion present. No pneumothorax or definite mass. No acute bony abnormalities are present. A RIGHT VP shunt catheter is noted. IMPRESSION: Bibasilar opacities suggestive of pneumonia. Radiographic follow-up to resolution recommended. Electronically Signed   By: Margarette Canada M.D.   On: 09/06/2018 14:41   Ct Abdomen Pelvis W Contrast  Result Date: 09/05/2018 CLINICAL DATA:  Right upper quadrant abdominal pain with a clinical suspicion of cholecystitis. EXAM: CT ABDOMEN AND PELVIS WITH CONTRAST TECHNIQUE: Multidetector CT imaging of the abdomen and pelvis was performed using  the standard protocol following bolus administration of intravenous contrast. CONTRAST:  172mL OMNIPAQUE IOHEXOL 300 MG/ML  SOLN COMPARISON:  Right upper quadrant abdomen ultrasound dated 07/02/2016. Abdomen and pelvis CT dated 10/14/2007. FINDINGS: Lower chest: Bibasilar atelectasis and possible pneumonia. Small right pleural effusion and minimal left pleural effusion. Small pericardial effusion with a maximum thickness of 10 mm. Hepatobiliary: No focal liver abnormality is seen. No gallstones, gallbladder wall thickening, or biliary dilatation. Pancreas: Unremarkable. No pancreatic ductal dilatation or surrounding inflammatory changes. Spleen: Normal in size without  focal abnormality. Adrenals/Urinary Tract: Interval 1.1 cm rounded fat density mass in the posterior aspect of the upper pole of the left kidney. A more laterally located left renal cyst is unchanged. Normal appearing right kidney, ureters, urinary bladder and adrenal glands. Stomach/Bowel: Unremarkable stomach, small bowel and colon. Surgically absent appendix. Vascular/Lymphatic: Atheromatous arterial calcifications without aneurysm. No enlarged lymph nodes. Reproductive: Prostate is unremarkable. Other: Ventricular peritoneal shunt catheter tip in the left upper pelvis anteriorly. No associated fluid collection. Musculoskeletal: Pedicle screw and rod fixation at the L2 through S1 levels with interbody fusion at the L4-5 and L5-S1 level. There is a vacuum phenomenon in the disc space and endplate irregularity and sclerosis at the L5-S1 level. IMPRESSION: 1. No acute abnormality in the abdomen or pelvis. 2. Bibasilar atelectasis and possible pneumonia. 3. Small right pleural effusion and minimal left pleural effusion. 4. Small pericardial effusion. 5. 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 Electronically Signed   By: [name] M.D.   On: 09/05/2018 21:12      Scheduled Meds: . cholecalciferol  1,000 Units Oral Daily  . enoxaparin (LOVENOX) injection  40 mg Subcutaneous Q24H  . ferrous gluconate  324 mg Oral BID WC  . finasteride  5 mg Oral Daily  . gabapentin  300 mg Oral QHS  . insulin aspart  0-5 Units Subcutaneous QHS  . insulin aspart  0-9 Units Subcutaneous TID WC  . nicotine  21 mg Transdermal Daily  . paliperidone  3 mg Oral QHS  . polyethylene glycol  17 g Oral Daily  . senna-docusate  2 tablet Oral QHS  . sertraline  100 mg Oral QHS  . tamsulosin  0.4 mg Oral QPC supper  . traZODone  75 mg Oral QHS   Continuous Infusions: . sodium chloride 75 mL/hr at 09/07/18 0506     LOS: 8 days    Time spent: Total of 25 minutes spent with pt, greater than 50%  of which was spent in discussion of  treatment, counseling and coordination of care    Oren Binet,  Pager: Text Page via [email or web address]   If 7PM-7AM, please contact night-coverage [email or web address] 09/07/2018, 2:03 PM

## 2018-09-08 DIAGNOSIS — M48061 Spinal stenosis, lumbar region without neurogenic claudication: Secondary | ICD-10-CM

## 2018-09-08 LAB — COMPREHENSIVE METABOLIC PANEL
ALT: 54 U/L — ABNORMAL HIGH (ref 0–44)
ANION GAP: 6 (ref 5–15)
AST: 67 U/L — ABNORMAL HIGH (ref 15–41)
Albumin: 2.6 g/dL — ABNORMAL LOW (ref 3.5–5.0)
Alkaline Phosphatase: 59 U/L (ref 38–126)
BUN: 9 mg/dL (ref 8–23)
CHLORIDE: 108 mmol/L (ref 98–111)
CO2: 26 mmol/L (ref 22–32)
CREATININE: 1.1 mg/dL (ref 0.61–1.24)
Calcium: 9.1 mg/dL (ref 8.9–10.3)
GFR calc non Af Amer: >60 mL/min (ref >60)
Glucose, Bld: 166 mg/dL — ABNORMAL HIGH (ref 70–99)
POTASSIUM: 3.8 mmol/L (ref 3.5–5.1)
Sodium: 140 mmol/L (ref 135–145)
Total Bilirubin: 0.6 mg/dL (ref 0.3–1.2)
Total Protein: 6.3 g/dL — ABNORMAL LOW (ref 6.5–8.1)

## 2018-09-08 LAB — GLUCOSE, CAPILLARY
Glucose-Capillary: 146 mg/dL — ABNORMAL HIGH (ref 70–99)
Glucose-Capillary: 140 mg/dL — ABNORMAL HIGH (ref 70–99)
Glucose-Capillary: 137 mg/dL — ABNORMAL HIGH (ref 70–99)

## 2018-09-08 LAB — CK: Total CK: 1557 U/L — ABNORMAL HIGH (ref 49–397)

## 2018-09-08 MED ORDER — POLYETHYLENE GLYCOL 3350 17 G PO PACK: 17.0000 g | PACK | Freq: Every day | ORAL | 0 refills | Status: AC

## 2018-09-08 NOTE — Progress Notes (Signed)
Patient discharged from the hospital and left via stretcher with PTAR. Patient IV was removed prior to discharge and report was given. Patient left was his belongings and AVS

## 2018-09-08 NOTE — Discharge Summary (Signed)
PATIENT DETAILS Name: [name] Age: 62 y.o. Sex: male Date of Birth: 1956-11-20 MRN: [id number]. Admitting Physician: Janora Norlander, MD MPN:TIRWERX, Christean Grief, MD  Admit Date: 08/30/2018 Discharge date: 09/08/2018  Recommendations for Outpatient Follow-up:  1. Follow up with PCP in 1-2 weeks 2. Please obtain BMP/CBC in one week 3. Ensure follow-up with rheumatology-see below  Admitted From:  Home  Disposition: SNF   Home Health: No  Equipment/Devices: None  Discharge Condition: Stable  CODE STATUS: FULL CODE  Diet recommendation:  Heart Healthy / Carb Modified   Brief Summary: See H&P, Labs, Consult and Test reports for all details in brief, Richard Hicks Tuckeris a 62 year old male with PMH of schizophrenia, PVD, DM, and spinal stenosis presented with rhabdomyolysis and bilateral proximal thigh myopathy in the setting of cocaine use . See below for further details  Brief Hospital Course: Rhabdomyolysis with bilateral muscle myopathy: Suspect secondary to cocaine use-no history of vigorous exercise etc. Due to sudden onset do not suspect polymyositis or dermatomyositis-has no skin findings suggestive of dermatomyositis. CK levels are downtrending-initially presented with more than 50,000, by day of discharge has trended down to 1557.  He did have pain in his upper thighs when he first presented, that has resolved. His overall myopathy/muscle weakness seems to be improving-although slowly-he was not even able to walk/get out of the bed when he first presented, by day of discharge, and he is just about able to lift himself off the bedside chair with hardly any assistance. I had discussed this case with neurology-Dr. Kathrynn Speed a few days back-he advised continued supportive care and PT evaluation.  He advised that sometimes these take several weeks to get better. I have spoken at length with the patient-if he does not improve the next few weeks-he is aware that he  may need further work-up including a muscle biopsy.  He probably would benefit from outpatient rheumatology evaluation.  Being discharged to SNF.  Hypokalemia, hypocalcemia, hypomagnesemia:Repleted-continue to check electrolytes periodically.  Transaminitis:Secondary to rhabdomyolysis-continues to slowly downtrend-AST 67, ALT 54 by day of discharge.  Intermittent RUQ pain: No abdominal pain today-abdominal exam is completely benign.  CT scan of the abdomen on 10/7 did not show any acute etiologies.  ? PNA: Has possible infiltrates/atelectasis on x-ray-he does not have any symptoms-monitor off antimicrobial therapy.  Small pericardial effusion seen on CT scan: Not seen on echocardiogram.  Constipation:Resolved-continue MiraLAX   DM w/ neuropathy, non-insulin dependent:CBG stable with SSI all inpatient-resume usual medications on discharge.  VQM:GQQPYPPJ Proscar and Flomax.  Schizophrenia: Stable continue paliperidone, Invega injections and trazodone  H/o lumbar spinal fusion secondary to spinal stenosis:I MRIon admissiondemonstrated fusion intact and without residual spinal stenosis.  Cocaine KDT:OIZTIWPY counsel for cessation of smoking crack cocaine.   Tobacco abuse:Counseled-continue transdermal nicotine  Procedures/Studies: None  Discharge Diagnoses:  Principal Problem:   Rhabdomyolysis Active Problems:   History of lumbar spinal fusion   Diabetes mellitus without complication ([place])   Spinal stenosis of lumbar region   Tobacco dependence   Transaminitis   RUQ pain   Hypokalemia   Discharge Instructions:  Activity:  As tolerated with Full fall precautions use walker/cane & assistance as needed   Discharge Instructions    Diet - low sodium heart healthy   Complete by:  As directed    Diet Carb Modified   Complete by:  As directed    Increase activity slowly   Complete by:  As directed      Allergies as of 09/08/2018  Reactions    Penicillins Hives, Other (See Comments)   Has patient had a PCN reaction causing immediate rash, facial/tongue/throat swelling, SOB or lightheadedness with hypotension: No Has patient had a PCN reaction causing severe rash involving mucus membranes or skin necrosis: No Has patient had a PCN reaction that required hospitalization No Has patient had a PCN reaction occurring within the last 10 years: No If all of the above answers are "NO", then may proceed with Cephalosporin use.      Medication List    STOP taking these medications   celecoxib 200 MG capsule Commonly known as:  CELEBREX     TAKE these medications   cholecalciferol 1000 units tablet Commonly known as:  VITAMIN D Take 1,000 Units by mouth daily.   ferrous gluconate 324 MG tablet Commonly known as:  FERGON Take 1 tablet (324 mg total) by mouth 2 (two) times daily with a meal.   finasteride 5 MG tablet Commonly known as:  PROSCAR Take 5 mg by mouth daily.   gabapentin 300 MG capsule Commonly known as:  NEURONTIN Take 1 capsule (300 mg total) by mouth at bedtime.   ibuprofen 600 MG tablet Commonly known as:  ADVIL,MOTRIN Take 600 mg by mouth daily.   INVEGA SUSTENNA 234 MG/1.5ML Susp injection Generic drug:  paliperidone Inject 234 mg into the muscle every 30 (thirty) days.   lidocaine 5 % Commonly known as:  LIDODERM Place 1 patch onto the skin daily. Remove & Discard patch within 12 hours or as directed by MD   metFORMIN 500 MG tablet Commonly known as:  GLUCOPHAGE Take 500 mg by mouth daily with breakfast.   nicotine 7 mg/24hr patch Commonly known as:  NICODERM CQ - dosed in mg/24 hr Place 1 patch (7 mg total) onto the skin daily.   paliperidone 3 MG 24 hr tablet Commonly known as:  INVEGA Take 1 tablet (3 mg total) by mouth at bedtime.   polyethylene glycol packet Commonly known as:  MIRALAX / GLYCOLAX Take 17 g by mouth daily. Start taking on:  09/09/2018   sertraline 100 MG  tablet Commonly known as:  ZOLOFT Take 100 mg by mouth at bedtime.   sitaGLIPtin 100 MG tablet Commonly known as:  JANUVIA Take 100 mg by mouth daily with breakfast.   tamsulosin 0.4 MG Caps capsule Commonly known as:  FLOMAX Take 0.4 mg by mouth daily after supper.   traZODone 150 MG tablet Commonly known as:  DESYREL Take 0.5 tablets (75 mg total) by mouth at bedtime.      Follow-up Information    Nolene Ebbs, MD. Schedule an appointment as soon as possible for a visit in 1 week(s).   Specialty:  Internal Medicine Contact information: [address] [place] [address] [phone]        Lahoma Rocker, MD. Schedule an appointment as soon as possible for a visit in 1 week(s).   Specialty:  Rheumatology Contact information: [address] [place] [address] [phone]          Allergies  Allergen Reactions  . Penicillins Hives and Other (See Comments)    Has patient had a PCN reaction causing immediate rash, facial/tongue/throat swelling, SOB or lightheadedness with hypotension: No Has patient had a PCN reaction causing severe rash involving mucus membranes or skin necrosis: No Has patient had a PCN reaction that required hospitalization No Has patient had a PCN reaction occurring within the last 10 years: No If all of the above  answers are "NO", then may proceed with Cephalosporin use.    Consultations:   None  Other Procedures/Studies: Dg Chest 2 View  Result Date: 09/06/2018 CLINICAL DATA:  Pneumonia EXAM: CHEST - 2 VIEW COMPARISON:  03/23/2018 FINDINGS: Mild cardiomegaly again noted. Bibasilar opacities are now noted. There may be a trace RIGHT pleural effusion present. No pneumothorax or definite mass. No acute bony abnormalities are present. A RIGHT VP shunt catheter is noted. IMPRESSION: Bibasilar opacities suggestive of pneumonia. Radiographic follow-up to resolution recommended. Electronically Signed   By: Margarette Canada  M.D.   On: 09/06/2018 14:41   Mr Lumbar Spine Wo Contrast  Result Date: 08/30/2018 CLINICAL DATA:  Bilateral thigh pain beginning yesterday. Lower extremity weakness. Prior lumbar fusion. EXAM: MRI LUMBAR SPINE WITHOUT CONTRAST TECHNIQUE: Multiplanar, multisequence MR imaging of the lumbar spine was performed. No intravenous contrast was administered. COMPARISON:  06/25/2017 FINDINGS: Segmentation:  Standard. Alignment:  New anterolisthesis of L5 on S1 measuring 3 mm. Vertebrae: Interval L5-S1 posterior and interbody fusion. Prior L2-L5 posterior fusion and L4-5 interbody fusion. No fracture, suspicious osseous lesion, or significant marrow edema. Conus medullaris and cauda equina: Conus extends to the L1 level. Conus and cauda equina appear normal. Paraspinal and other soft tissues: Postoperative changes in the posterior lumbar soft tissues with small posterior fluid collections visible at L3-4 and L5-S1 measuring less than 1 cm in AP thickness. Unchanged 11 mm T2 hyperintense lesion posteriorly in the left kidney, likely a cyst. Disc levels: T12-L1: Mild facet arthrosis without disc herniation or stenosis. L1-2: Mild facet arthrosis without disc herniation or stenosis, unchanged. L2-3: Prior fusion.  No stenosis. L3-4: Prior posterior decompression and fusion. Unchanged mild foraminal endplate spurring without significant stenosis. L4-5: Prior posterior decompression and fusion. No significant stenosis. L5-S1: Interval posterior decompression and fusion. No significant residual spinal or lateral recess stenosis. Limited assessment of the neural foramina due to susceptibility artifact with possibly mild right neural foraminal stenosis. The right S1 screw is more inferiorly located than that on the left and courses medial to the pedicle potentially a pinching upon the right S1 nerve root. IMPRESSION: 1. Interval L5-S1 fusion without residual spinal stenosis. Inferomedial course of the right S1 screw potentially  affecting the right S1 nerve root. 2. Unchanged appearance of L2-L5 fusion. Electronically Signed   By: Logan Bores M.D.   On: 08/30/2018 14:47   Ct Abdomen Pelvis W Contrast  Result Date: 09/05/2018 CLINICAL DATA:  Right upper quadrant abdominal pain with a clinical suspicion of cholecystitis. EXAM: CT ABDOMEN AND PELVIS WITH CONTRAST TECHNIQUE: Multidetector CT imaging of the abdomen and pelvis was performed using the standard protocol following bolus administration of intravenous contrast. CONTRAST:  149mL OMNIPAQUE IOHEXOL 300 MG/ML  SOLN COMPARISON:  Right upper quadrant abdomen ultrasound dated 07/02/2016. Abdomen and pelvis CT dated 10/14/2007. FINDINGS: Lower chest: Bibasilar atelectasis and possible pneumonia. Small right pleural effusion and minimal left pleural effusion. Small pericardial effusion with a maximum thickness of 10 mm. Hepatobiliary: No focal liver abnormality is seen. No gallstones, gallbladder wall thickening, or biliary dilatation. Pancreas: Unremarkable. No pancreatic ductal dilatation or surrounding inflammatory changes. Spleen: Normal in size without focal abnormality. Adrenals/Urinary Tract: Interval 1.1 cm rounded fat density mass in the posterior aspect of the upper pole of the left kidney. A more laterally located left renal cyst is unchanged. Normal appearing right kidney, ureters, urinary bladder and adrenal glands. Stomach/Bowel: Unremarkable stomach, small bowel and colon. Surgically absent appendix. Vascular/Lymphatic: Atheromatous arterial calcifications without aneurysm. No enlarged  lymph nodes. Reproductive: Prostate is unremarkable. Other: Ventricular peritoneal shunt catheter tip in the left upper pelvis anteriorly. No associated fluid collection. Musculoskeletal: Pedicle screw and rod fixation at the L2 through S1 levels with interbody fusion at the L4-5 and L5-S1 level. There is a vacuum phenomenon in the disc space and endplate irregularity and sclerosis at the  L5-S1 level. IMPRESSION: 1. No acute abnormality in the abdomen or pelvis. 2. Bibasilar atelectasis and possible pneumonia. 3. Small right pleural effusion and minimal left pleural effusion. 4. Small pericardial effusion. 5. 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 Electronically Signed   By: [name] M.D.   On: 09/05/2018 21:12      TODAY-DAY OF DISCHARGE:  Subjective:   [name] today has no headache,no chest abdominal pain,no new weakness tingling or numbness, feels much better wants to go home today.   Objective:   Blood pressure 136/65, pulse 92, temperature 98.3 F (36.8 C), temperature source Oral, resp. rate 18, height 6' (1.829 m), weight 82.9 kg, SpO2 98 %.  Intake/Output Summary (Last 24 hours) at 09/08/2018 1326 Last data filed at 09/08/2018 0900 Gross per 24 hour  Intake 2312.54 ml  Output 3200 ml  Net -887.46 ml   Filed Weights   08/30/18 0842 08/30/18 1618  Weight: 85.3 kg 82.9 kg    Exam: Awake Alert, Oriented *3, No new F.N deficits, Normal affect [place].AT,PERRAL Supple Neck,No JVD, No cervical lymphadenopathy appriciated.  Symmetrical Chest wall movement, Good air movement bilaterally, CTAB RRR,No Gallops,Rubs or new Murmurs, No Parasternal Heave +ve B.Sounds, Abd Soft, Non tender, No organomegaly appriciated, No rebound -guarding or rigidity. No Cyanosis, Clubbing or edema, No new Rash or bruise   PERTINENT RADIOLOGIC STUDIES: Dg Chest 2 View  Result Date: 09/06/2018 CLINICAL DATA:  Pneumonia EXAM: CHEST - 2 VIEW COMPARISON:  03/23/2018 FINDINGS: Mild cardiomegaly again noted. Bibasilar opacities are now noted. There may be a trace RIGHT pleural effusion present. No pneumothorax or definite mass. No acute bony abnormalities are present. A RIGHT VP shunt catheter is noted. IMPRESSION: Bibasilar opacities suggestive of pneumonia. Radiographic follow-up to resolution recommended. Electronically Signed   By: Margarette Canada M.D.    On: 09/06/2018 14:41   Mr Lumbar Spine Wo Contrast  Result Date: 08/30/2018 CLINICAL DATA:  Bilateral thigh pain beginning yesterday. Lower extremity weakness. Prior lumbar fusion. EXAM: MRI LUMBAR SPINE WITHOUT CONTRAST TECHNIQUE: Multiplanar, multisequence MR imaging of the lumbar spine was performed. No intravenous contrast was administered. COMPARISON:  06/25/2017 FINDINGS: Segmentation:  Standard. Alignment:  New anterolisthesis of L5 on S1 measuring 3 mm. Vertebrae: Interval L5-S1 posterior and interbody fusion. Prior L2-L5 posterior fusion and L4-5 interbody fusion. No fracture, suspicious osseous lesion, or significant marrow edema. Conus medullaris and cauda equina: Conus extends to the L1 level. Conus and cauda equina appear normal. Paraspinal and other soft tissues: Postoperative changes in the posterior lumbar soft tissues with small posterior fluid collections visible at L3-4 and L5-S1 measuring less than 1 cm in AP thickness. Unchanged 11 mm T2 hyperintense lesion posteriorly in the left kidney, likely a cyst. Disc levels: T12-L1: Mild facet arthrosis without disc herniation or stenosis. L1-2: Mild facet arthrosis without disc herniation or stenosis, unchanged. L2-3: Prior fusion.  No stenosis. L3-4: Prior posterior decompression and fusion. Unchanged mild foraminal endplate spurring without significant stenosis. L4-5: Prior posterior decompression and fusion. No significant stenosis. L5-S1: Interval posterior decompression and fusion. No significant residual spinal or lateral recess stenosis. Limited assessment  of the neural foramina due to susceptibility artifact with possibly mild right neural foraminal stenosis. The right S1 screw is more inferiorly located than that on the left and courses medial to the pedicle potentially a pinching upon the right S1 nerve root. IMPRESSION: 1. Interval L5-S1 fusion without residual spinal stenosis. Inferomedial course of the right S1 screw potentially  affecting the right S1 nerve root. 2. Unchanged appearance of L2-L5 fusion. Electronically Signed   By: Logan Bores M.D.   On: 08/30/2018 14:47   Ct Abdomen Pelvis W Contrast  Result Date: 09/05/2018 CLINICAL DATA:  Right upper quadrant abdominal pain with a clinical suspicion of cholecystitis. EXAM: CT ABDOMEN AND PELVIS WITH CONTRAST TECHNIQUE: Multidetector CT imaging of the abdomen and pelvis was performed using the standard protocol following bolus administration of intravenous contrast. CONTRAST:  120mL OMNIPAQUE IOHEXOL 300 MG/ML  SOLN COMPARISON:  Right upper quadrant abdomen ultrasound dated 07/02/2016. Abdomen and pelvis CT dated 10/14/2007. FINDINGS: Lower chest: Bibasilar atelectasis and possible pneumonia. Small right pleural effusion and minimal left pleural effusion. Small pericardial effusion with a maximum thickness of 10 mm. Hepatobiliary: No focal liver abnormality is seen. No gallstones, gallbladder wall thickening, or biliary dilatation. Pancreas: Unremarkable. No pancreatic ductal dilatation or surrounding inflammatory changes. Spleen: Normal in size without focal abnormality. Adrenals/Urinary Tract: Interval 1.1 cm rounded fat density mass in the posterior aspect of the upper pole of the left kidney. A more laterally located left renal cyst is unchanged. Normal appearing right kidney, ureters, urinary bladder and adrenal glands. Stomach/Bowel: Unremarkable stomach, small bowel and colon. Surgically absent appendix. Vascular/Lymphatic: Atheromatous arterial calcifications without aneurysm. No enlarged lymph nodes. Reproductive: Prostate is unremarkable. Other: Ventricular peritoneal shunt catheter tip in the left upper pelvis anteriorly. No associated fluid collection. Musculoskeletal: Pedicle screw and rod fixation at the L2 through S1 levels with interbody fusion at the L4-5 and L5-S1 level. There is a vacuum phenomenon in the disc space and endplate irregularity and sclerosis at the  L5-S1 level. IMPRESSION: 1. No acute abnormality in the abdomen or pelvis. 2. Bibasilar atelectasis and possible pneumonia. 3. Small right pleural effusion and minimal left pleural effusion. 4. Small pericardial effusion. 5. 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 Electronically Signed   By: [name] M.D.   On: 09/05/2018 21:12     PERTINENT LAB RESULTS: CBC: Recent Labs    09/07/18 0526  WBC 10.0  HGB 9.0*  HCT 27.7*  PLT 308   CMET CMP     Component Value Date/Time   NA 140 09/08/2018 0355   K 3.8 09/08/2018 0355   CL 108 09/08/2018 0355   CO2 26 09/08/2018 0355   GLUCOSE 166 (H) 09/08/2018 0355   BUN 9 09/08/2018 0355   CREATININE 1.10 09/08/2018 0355   CALCIUM 9.1 09/08/2018 0355   PROT 6.3 (L) 09/08/2018 0355   PROT 7.5 04/10/2015 1411   ALBUMIN 2.6 (L) 09/08/2018 0355   AST 67 (H) 09/08/2018 0355   ALT 54 (H) 09/08/2018 0355   ALKPHOS 59 09/08/2018 0355   BILITOT 0.6 09/08/2018 0355   GFRNONAA >60 09/08/2018 0355   GFRAA >60 09/08/2018 0355    GFR Estimated Creatinine Clearance: 77.4 mL/min (by C-G formula based on SCr of 1.1 mg/dL). No results for input(s): LIPASE, AMYLASE in the last 72 hours. Recent Labs    09/06/18 0242 09/07/18 0526 09/08/18 0355  CKTOTAL 3,160* 2,272* 1,557*   Invalid input(s): POCBNP No results for input(s): DDIMER  in the last 72 hours. No results for input(s): HGBA1C in the last 72 hours. No results for input(s): CHOL, HDL, LDLCALC, TRIG, CHOLHDL, LDLDIRECT in the last 72 hours. No results for input(s): TSH, T4TOTAL, T3FREE, THYROIDAB in the last 72 hours.  Invalid input(s): FREET3 No results for input(s): VITAMINB12, FOLATE, FERRITIN, TIBC, IRON, RETICCTPCT in the last 72 hours. Coags: No results for input(s): INR in the last 72 hours.  Invalid input(s): PT Microbiology: Recent Results (from the past 240 hour(s))  Urine culture     Status: None   Collection Time: 08/30/18 11:50 AM  Result  Value Ref Range Status   Specimen Description URINE, CLEAN CATCH  Final   Special Requests NONE  Final   Culture   Final    NO GROWTH Performed at [facility], [address], [place], [place] [address]    Report Status 08/31/2018 FINAL  Final    FURTHER DISCHARGE INSTRUCTIONS:  Get Medicines reviewed and adjusted: Please take all your medications with you for your next visit with your Primary MD  Laboratory/radiological data: Please request your Primary MD to go over all hospital tests and procedure/radiological results at the follow up, please ask your Primary MD to get all Hospital records sent to his/her office.  In some cases, they will be blood work, cultures and biopsy results pending at the time of your discharge. Please request that your primary care M.D. goes through all the records of your hospital data and follows up on these results.  Also Note the following: If you experience worsening of your admission symptoms, develop shortness of breath, life threatening emergency, suicidal or homicidal thoughts you must seek medical attention immediately by calling 911 or calling your MD immediately  if symptoms less severe.  You must read complete instructions/literature along with all the possible adverse reactions/side effects for all the Medicines you take and that have been prescribed to you. Take any new Medicines after you have completely understood and accpet all the possible adverse reactions/side effects.   Do not drive when taking Pain medications or sleeping medications (Benzodaizepines)  Do not take more than prescribed Pain, Sleep and Anxiety Medications. It is not advisable to combine anxiety,sleep and pain medications without talking with your primary care practitioner  Special Instructions: If you have smoked or chewed Tobacco  in the last 2 yrs please stop smoking, stop any regular Alcohol  and or any Recreational drug use.  Wear Seat belts while  driving.  Please note: You were cared for by a hospitalist during your hospital stay. Once you are discharged, your primary care physician will handle any further medical issues. Please note that NO REFILLS for any discharge medications will be authorized once you are discharged, as it is imperative that you return to your primary care physician (or establish a relationship with a primary care physician if you do not have one) for your post hospital discharge needs so that they can reassess your need for medications and monitor your lab values.  Total Time spent coordinating discharge including counseling, education and face to face time equals 35 minutes.  Signed: Shanker Ghimire 09/08/2018 1:26 PM

## 2018-09-08 NOTE — Progress Notes (Signed)
Patient will DC to: [place] Anticipated DC date: 09/08/18 Family notified: Declined, will alert family Transport by: Rodell Perna   Per MD patient ready for DC to [facility]. RN, patient, patient's family, and facility notified of DC. Discharge Summary and FL2 sent to facility. RN to call report prior to discharge [phone]). DC packet on chart. Ambulance transport requested for patient.   CSW will sign off for now as social work intervention is no longer needed. Please consult [place] again if new needs arise.  Cedric Fishman, LCSW Clinical Social Worker [phone]

## 2018-09-08 NOTE — Progress Notes (Addendum)
Report called to nurse, Phineas Real  at [facility], at [phone]. Pt waiting [facility] for transportation.

## 2018-09-08 NOTE — Clinical Social Work Placement (Signed)
   CLINICAL SOCIAL WORK PLACEMENT  NOTE  Date:  09/08/2018  Patient Details  Name: [name] MRN: [id number] Date of Birth: Nov 24, 1956  Clinical Social Work is seeking post-discharge placement for this patient at the [place] level of care (*CSW will initial, date and re-position this form in  chart as items are completed):  Yes   Patient/family provided with [facility] Work Department's list of facilities offering this level of care within the geographic area requested by the patient (or if unable, by the patient's family).  Yes   Patient/family informed of their freedom to choose among providers that offer the needed level of care, that participate in Medicare, Medicaid or managed care program needed by the patient, have an available bed and are willing to accept the patient.  Yes   Patient/family informed of [facility]'s ownership interest in [facility] and [facility], as well as of the fact that they are under no obligation to receive care at these facilities.  PASRR submitted to EDS on 09/02/18     PASRR number received on       Existing PASRR number confirmed on       FL2 transmitted to all facilities in geographic area requested by pt/family on 09/02/18     FL2 transmitted to all facilities within larger geographic area on       Patient informed that his/her managed care company has contracts with or will negotiate with certain facilities, including the following:        Yes   Patient/family informed of bed offers received.  Patient chooses bed at [facility]     Physician recommends and patient chooses bed at      Patient to be transferred to [facility] on 09/08/18.  Patient to be transferred to facility by [facility]     Patient family notified on 09/08/18 of transfer.  Name of family member notified:  Patient declined     PHYSICIAN       Additional Comment:    _______________________________________________ Benard Halsted, LCSW 09/08/2018, 3:19 PM

## 2018-09-08 NOTE — Progress Notes (Addendum)
Physical Therapy Treatment Patient Details Name: [name] MRN: [id number] DOB: [birth year] Today's Date: 09/08/2018    History of Present Illness Pt is a 62 y.o. male admitted 08/30/18 with BLE pain, weakness, and inability to walk; worke up for rhabdomyolysis of unclear cause. PMH includes schizophrenia, PVD, spinal stenosis, DM, L5-S1 PLIF (05/2018).    PT Comments    Pt making steady progress with functional mobility. Continues to be limited secondary to fatigue and pain. Pt would continue to benefit from skilled physical therapy services at this time while admitted and after d/c to address the below listed limitations in order to improve overall safety and independence with functional mobility.    Follow Up Recommendations  SNF;Supervision for mobility/OOB     Equipment Recommendations  None recommended by PT    Recommendations for Other Services       Precautions / Restrictions Precautions Precautions: Fall Restrictions Weight Bearing Restrictions: No    Mobility  Bed Mobility               General bed mobility comments: received in chair  Transfers Overall transfer level: Needs assistance Equipment used: Rolling walker (2 wheeled) Transfers: Sit to/from Stand Sit to Stand: Min assist         General transfer comment: increased time and effort, good technique, min A for stability with transitional movement  Ambulation/Gait Ambulation/Gait assistance: Min guard;Min assist Gait Distance (Feet): 40 Feet Assistive device: Rolling walker (2 wheeled) Gait Pattern/deviations: Step-to pattern;Decreased step length - right;Decreased step length - left;Decreased stride length;Antalgic;Trunk flexed Gait velocity: decreased Gait velocity interpretation: <1.31 ft/sec, indicative of household ambulator General Gait Details: very short step lengths bilaterally, modestly antalgic, cueing for upright posture and forward gaze   Stairs             Wheelchair  Mobility    Modified Rankin (Stroke Patients Only)       Balance Overall balance assessment: Needs assistance Sitting-balance support: Feet supported Sitting balance-Leahy Scale: Fair     Standing balance support: During functional activity;Bilateral upper extremity supported Standing balance-Leahy Scale: Poor                              Cognition Arousal/Alertness: Awake/alert Behavior During Therapy: Flat affect Overall Cognitive Status: No family/caregiver present to determine baseline cognitive functioning Area of Impairment: Safety/judgement;Problem solving                         Safety/Judgement: Decreased awareness of safety;Decreased awareness of deficits   Problem Solving: Slow processing;Difficulty sequencing;Requires verbal cues;Requires tactile cues;Decreased initiation        Exercises      General Comments        Pertinent Vitals/Pain Pain Assessment: Faces Faces Pain Scale: Hurts little more Pain Location: bilateral feet Pain Descriptors / Indicators: Sore Pain Intervention(s): Monitored during session;Repositioned    Home Living                      Prior Function            PT Goals (current goals can now be found in the care plan section) Acute Rehab PT Goals PT Goal Formulation: With patient Time For Goal Achievement: 09/14/18 Potential to Achieve Goals: Fair Progress towards PT goals: Progressing toward goals    Frequency    Min 2X/week      PT Plan Current plan remains  appropriate    Co-evaluation              AM-PAC PT "6 Clicks" Daily Activity  Outcome Measure  Difficulty turning over in bed (including adjusting bedclothes, sheets and blankets)?: Unable Difficulty moving from lying on back to sitting on the side of the bed? : Unable Difficulty sitting down on and standing up from a chair with arms (e.g., wheelchair, bedside commode, etc,.)?: Unable Help needed moving to and from a  bed to chair (including a wheelchair)?: A Little Help needed walking in hospital room?: A Little Help needed climbing 3-5 steps with a railing? : Total 6 Click Score: 10    End of Session Equipment Utilized During Treatment: Gait belt Activity Tolerance: Patient limited by fatigue;Patient limited by pain Patient left: in chair;with call bell/phone within reach;with chair alarm set Nurse Communication: Mobility status PT Visit Diagnosis: Other abnormalities of gait and mobility (R26.89);Muscle weakness (generalized) (M62.81)     Time: 9702-6378 PT Time Calculation (min) (ACUTE ONLY): 17 min  Charges:  $Gait Training: 8-22 mins                     Sherie Don, [place], DPT  Acute Rehabilitation Services Pager [phone] Office [place] 09/08/2018, 3:44 PM

## 2018-09-08 NOTE — Progress Notes (Signed)
Only accepting SNF is [facility] (they are willing to accept patient without notifying car insurance claim). Patient aware.   Percell Locus Kaytlyn Din LCSW [phone]

## 2018-10-05 ENCOUNTER — Inpatient Hospital Stay (HOSPITAL_COMMUNITY)
Admission: EM | Admit: 2018-10-05 | Discharge: 2018-10-16 | DRG: 280 | Disposition: A | Discharge status: Home Health | Payer: Medicare Other | Attending: Internal Medicine | Admitting: Internal Medicine

## 2018-10-05 ENCOUNTER — Encounter (HOSPITAL_COMMUNITY): Payer: Self-pay

## 2018-10-05 ENCOUNTER — Emergency Department (HOSPITAL_COMMUNITY): Payer: Medicare Other

## 2018-10-05 DIAGNOSIS — E876 Hypokalemia: Secondary | ICD-10-CM | POA: Diagnosis not present

## 2018-10-05 DIAGNOSIS — Z982 Presence of cerebrospinal fluid drainage device: Secondary | ICD-10-CM

## 2018-10-05 DIAGNOSIS — R410 Disorientation, unspecified: Secondary | ICD-10-CM | POA: Diagnosis not present

## 2018-10-05 DIAGNOSIS — Z79899 Other long term (current) drug therapy: Secondary | ICD-10-CM

## 2018-10-05 DIAGNOSIS — I5023 Acute on chronic systolic (congestive) heart failure: Secondary | ICD-10-CM

## 2018-10-05 DIAGNOSIS — J189 Pneumonia, unspecified organism: Secondary | ICD-10-CM | POA: Diagnosis present

## 2018-10-05 DIAGNOSIS — Z8719 Personal history of other diseases of the digestive system: Secondary | ICD-10-CM

## 2018-10-05 DIAGNOSIS — E1151 Type 2 diabetes mellitus with diabetic peripheral angiopathy without gangrene: Secondary | ICD-10-CM | POA: Diagnosis present

## 2018-10-05 DIAGNOSIS — G8929 Other chronic pain: Secondary | ICD-10-CM | POA: Diagnosis present

## 2018-10-05 DIAGNOSIS — I272 Pulmonary hypertension, unspecified: Secondary | ICD-10-CM | POA: Diagnosis present

## 2018-10-05 DIAGNOSIS — G9340 Encephalopathy, unspecified: Secondary | ICD-10-CM | POA: Diagnosis not present

## 2018-10-05 DIAGNOSIS — I472 Ventricular tachycardia: Secondary | ICD-10-CM | POA: Diagnosis not present

## 2018-10-05 DIAGNOSIS — F1910 Other psychoactive substance abuse, uncomplicated: Secondary | ICD-10-CM | POA: Diagnosis present

## 2018-10-05 DIAGNOSIS — Z880 Allergy status to penicillin: Secondary | ICD-10-CM

## 2018-10-05 DIAGNOSIS — I5030 Unspecified diastolic (congestive) heart failure: Secondary | ICD-10-CM | POA: Diagnosis not present

## 2018-10-05 DIAGNOSIS — J810 Acute pulmonary edema: Secondary | ICD-10-CM | POA: Diagnosis not present

## 2018-10-05 DIAGNOSIS — M4800 Spinal stenosis, site unspecified: Secondary | ICD-10-CM | POA: Diagnosis present

## 2018-10-05 DIAGNOSIS — I5031 Acute diastolic (congestive) heart failure: Secondary | ICD-10-CM | POA: Diagnosis present

## 2018-10-05 DIAGNOSIS — F1410 Cocaine abuse, uncomplicated: Secondary | ICD-10-CM | POA: Diagnosis present

## 2018-10-05 DIAGNOSIS — I5021 Acute systolic (congestive) heart failure: Secondary | ICD-10-CM | POA: Diagnosis not present

## 2018-10-05 DIAGNOSIS — Z9911 Dependence on respirator [ventilator] status: Secondary | ICD-10-CM | POA: Diagnosis not present

## 2018-10-05 DIAGNOSIS — E1140 Type 2 diabetes mellitus with diabetic neuropathy, unspecified: Secondary | ICD-10-CM | POA: Diagnosis present

## 2018-10-05 DIAGNOSIS — E870 Hyperosmolality and hypernatremia: Secondary | ICD-10-CM | POA: Diagnosis not present

## 2018-10-05 DIAGNOSIS — J9690 Respiratory failure, unspecified, unspecified whether with hypoxia or hypercapnia: Secondary | ICD-10-CM

## 2018-10-05 DIAGNOSIS — D649 Anemia, unspecified: Secondary | ICD-10-CM | POA: Diagnosis not present

## 2018-10-05 DIAGNOSIS — F419 Anxiety disorder, unspecified: Secondary | ICD-10-CM | POA: Diagnosis present

## 2018-10-05 DIAGNOSIS — E119 Type 2 diabetes mellitus without complications: Secondary | ICD-10-CM | POA: Diagnosis not present

## 2018-10-05 DIAGNOSIS — F209 Schizophrenia, unspecified: Secondary | ICD-10-CM | POA: Diagnosis present

## 2018-10-05 DIAGNOSIS — N179 Acute kidney failure, unspecified: Secondary | ICD-10-CM | POA: Diagnosis present

## 2018-10-05 DIAGNOSIS — R0602 Shortness of breath: Secondary | ICD-10-CM | POA: Diagnosis not present

## 2018-10-05 DIAGNOSIS — F17210 Nicotine dependence, cigarettes, uncomplicated: Secondary | ICD-10-CM | POA: Diagnosis present

## 2018-10-05 DIAGNOSIS — R7989 Other specified abnormal findings of blood chemistry: Secondary | ICD-10-CM

## 2018-10-05 DIAGNOSIS — J9601 Acute respiratory failure with hypoxia: Secondary | ICD-10-CM | POA: Diagnosis present

## 2018-10-05 DIAGNOSIS — I214 Non-ST elevation (NSTEMI) myocardial infarction: Principal | ICD-10-CM | POA: Diagnosis present

## 2018-10-05 DIAGNOSIS — I340 Nonrheumatic mitral (valve) insufficiency: Secondary | ICD-10-CM

## 2018-10-05 DIAGNOSIS — J9600 Acute respiratory failure, unspecified whether with hypoxia or hypercapnia: Secondary | ICD-10-CM

## 2018-10-05 DIAGNOSIS — Z452 Encounter for adjustment and management of vascular access device: Secondary | ICD-10-CM

## 2018-10-05 DIAGNOSIS — I2510 Atherosclerotic heart disease of native coronary artery without angina pectoris: Secondary | ICD-10-CM | POA: Diagnosis present

## 2018-10-05 DIAGNOSIS — Z86718 Personal history of other venous thrombosis and embolism: Secondary | ICD-10-CM

## 2018-10-05 DIAGNOSIS — F319 Bipolar disorder, unspecified: Secondary | ICD-10-CM | POA: Diagnosis present

## 2018-10-05 DIAGNOSIS — Z7984 Long term (current) use of oral hypoglycemic drugs: Secondary | ICD-10-CM

## 2018-10-05 DIAGNOSIS — Z8249 Family history of ischemic heart disease and other diseases of the circulatory system: Secondary | ICD-10-CM

## 2018-10-05 DIAGNOSIS — N400 Enlarged prostate without lower urinary tract symptoms: Secondary | ICD-10-CM | POA: Diagnosis present

## 2018-10-05 DIAGNOSIS — R451 Restlessness and agitation: Secondary | ICD-10-CM | POA: Diagnosis not present

## 2018-10-05 DIAGNOSIS — F314 Bipolar disorder, current episode depressed, severe, without psychotic features: Secondary | ICD-10-CM | POA: Diagnosis not present

## 2018-10-05 LAB — BASIC METABOLIC PANEL
Anion gap: 10 (ref 5–15)
BUN: 13 mg/dL (ref 8–23)
CALCIUM: 8.8 mg/dL — AB (ref 8.9–10.3)
CO2: 23 mmol/L (ref 22–32)
CREATININE: 1.32 mg/dL — AB (ref 0.61–1.24)
Chloride: 107 mmol/L (ref 98–111)
GFR calc Af Amer: >60 mL/min (ref >60)
GFR calc non Af Amer: 57 mL/min — ABNORMAL LOW (ref >60)
GLUCOSE: 240 mg/dL — AB (ref 70–99)
Potassium: 3.2 mmol/L — ABNORMAL LOW (ref 3.5–5.1)
Sodium: 140 mmol/L (ref 135–145)

## 2018-10-05 LAB — I-STAT ARTERIAL BLOOD GAS, ED
ACID-BASE DEFICIT: 1 mmol/L (ref 0.0–2.0)
ACID-BASE DEFICIT: 1 mmol/L (ref 0.0–2.0)
ACID-BASE EXCESS: 1 mmol/L (ref 0.0–2.0)
BICARBONATE: 25.9 mmol/L (ref 20.0–28.0)
BICARBONATE: 24.4 mmol/L (ref 20.0–28.0)
BICARBONATE: 24.7 mmol/L (ref 20.0–28.0)
O2 SAT: 79 %
O2 SAT: 87 %
O2 Saturation: 93 %
PH ART: 7.341 — AB (ref 7.350–7.450)
TCO2: 27 mmol/L (ref 22–32)
TCO2: 26 mmol/L (ref 22–32)
TCO2: 26 mmol/L (ref 22–32)
pCO2 arterial: 40.8 mmHg (ref 32.0–48.0)
pCO2 arterial: 45.4 mmHg (ref 32.0–48.0)
pCO2 arterial: 43.9 mmHg (ref 32.0–48.0)
pH, Arterial: 7.413 (ref 7.350–7.450)
pH, Arterial: 7.359 (ref 7.350–7.450)
pO2, Arterial: 67 mmHg — ABNORMAL LOW (ref 83.0–108.0)
pO2, Arterial: 47 mmHg — ABNORMAL LOW (ref 83.0–108.0)
pO2, Arterial: 56 mmHg — ABNORMAL LOW (ref 83.0–108.0)

## 2018-10-05 LAB — CBC WITH DIFFERENTIAL/PLATELET
Abs Immature Granulocytes: 0.15 10*3/uL — ABNORMAL HIGH (ref 0.00–0.07)
Basophils Absolute: 0.1 10*3/uL (ref 0.0–0.1)
Basophils Relative: 0 %
Eosinophils Absolute: 0.1 10*3/uL (ref 0.0–0.5)
Eosinophils Relative: 0 %
HCT: 32.5 % — ABNORMAL LOW (ref 39.0–52.0)
HEMOGLOBIN: 10.2 g/dL — AB (ref 13.0–17.0)
Immature Granulocytes: 1 %
LYMPHS ABS: 3.5 10*3/uL (ref 0.7–4.0)
LYMPHS PCT: 20 %
MCH: 31.2 pg (ref 26.0–34.0)
MCHC: 31.4 g/dL (ref 30.0–36.0)
MCV: 99.4 fL (ref 80.0–100.0)
MONO ABS: 1.4 10*3/uL — AB (ref 0.1–1.0)
MONOS PCT: 8 %
NEUTROS ABS: 12.3 10*3/uL — AB (ref 1.7–7.7)
Neutrophils Relative %: 71 %
Platelets: 198 10*3/uL (ref 150–400)
RBC: 3.27 MIL/uL — AB (ref 4.22–5.81)
RDW: 12 % (ref 11.5–15.5)
WBC: 17.5 10*3/uL — ABNORMAL HIGH (ref 4.0–10.5)
nRBC: 0 % (ref 0.0–0.2)

## 2018-10-05 LAB — I-STAT CHEM 8, ED
BUN: 13 mg/dL (ref 8–23)
CHLORIDE: 106 mmol/L (ref 98–111)
CREATININE: 1.2 mg/dL (ref 0.61–1.24)
Calcium, Ion: 1.14 mmol/L — ABNORMAL LOW (ref 1.15–1.40)
GLUCOSE: 239 mg/dL — AB (ref 70–99)
HCT: 30 % — ABNORMAL LOW (ref 39.0–52.0)
Hemoglobin: 10.2 g/dL — ABNORMAL LOW (ref 13.0–17.0)
POTASSIUM: 3.6 mmol/L (ref 3.5–5.1)
Sodium: 142 mmol/L (ref 135–145)
TCO2: 22 mmol/L (ref 22–32)

## 2018-10-05 LAB — PROCALCITONIN: PROCALCITONIN: 0.73 ng/mL

## 2018-10-05 LAB — I-STAT TROPONIN, ED: Troponin i, poc: 4.22 ng/mL (ref 0.00–0.08)

## 2018-10-05 LAB — GLUCOSE, CAPILLARY: GLUCOSE-CAPILLARY: 144 mg/dL — AB (ref 70–99)

## 2018-10-05 LAB — D-DIMER, QUANTITATIVE: D-Dimer, Quant: 1.5 ug/mL-FEU — ABNORMAL HIGH (ref 0.00–0.50)

## 2018-10-05 LAB — HEPARIN LEVEL (UNFRACTIONATED): Heparin Unfractionated: 0.16 IU/mL — ABNORMAL LOW (ref 0.30–0.70)

## 2018-10-05 LAB — TROPONIN I: Troponin I: 5.34 ng/mL (ref <0.03)

## 2018-10-05 LAB — BRAIN NATRIURETIC PEPTIDE: B Natriuretic Peptide: 779.2 pg/mL — ABNORMAL HIGH (ref 0.0–100.0)

## 2018-10-05 MED ORDER — HEPARIN (PORCINE) 25000 UT/250ML-% IV SOLN
1200.0000 [IU]/h | INTRAVENOUS | Status: DC
  Administered 2018-10-05: 1000 [IU]/h via INTRAVENOUS
  Filled 2018-10-05: qty 250

## 2018-10-05 MED ORDER — IPRATROPIUM-ALBUTEROL 0.5-2.5 (3) MG/3ML IN SOLN
3.0000 mL | Freq: Once | RESPIRATORY_TRACT | Status: DC
  Filled 2018-10-05: qty 3

## 2018-10-05 MED ORDER — ORAL CARE MOUTH RINSE
15.0000 mL | OROMUCOSAL | Status: DC
  Administered 2018-10-05 – 2018-10-12 (×65): 15 mL via OROMUCOSAL

## 2018-10-05 MED ORDER — PROPOFOL 1000 MG/100ML IV EMUL
10.0000 ug/kg/min | Freq: Once | INTRAVENOUS | Status: AC
  Administered 2018-10-05: 10 ug/kg/min via INTRAVENOUS

## 2018-10-05 MED ORDER — HEPARIN BOLUS VIA INFUSION
4000.0000 [IU] | Freq: Once | INTRAVENOUS | Status: AC
  Administered 2018-10-05: 4000 [IU] via INTRAVENOUS
  Filled 2018-10-05: qty 4000

## 2018-10-05 MED ORDER — FENTANYL BOLUS VIA INFUSION
50.0000 ug | INTRAVENOUS | Status: DC | PRN
  Administered 2018-10-07 – 2018-10-10 (×14): 50 ug via INTRAVENOUS
  Filled 2018-10-05: qty 50

## 2018-10-05 MED ORDER — FUROSEMIDE 10 MG/ML IJ SOLN
80.0000 mg | Freq: Once | INTRAMUSCULAR | Status: AC
  Administered 2018-10-05: 80 mg via INTRAVENOUS
  Filled 2018-10-05: qty 8

## 2018-10-05 MED ORDER — MIDAZOLAM HCL 2 MG/2ML IJ SOLN
2.0000 mg | INTRAMUSCULAR | Status: DC | PRN
  Administered 2018-10-06 – 2018-10-09 (×15): 2 mg via INTRAVENOUS
  Administered 2018-10-10: 1 mg via INTRAVENOUS
  Administered 2018-10-10: 2 mg via INTRAVENOUS
  Administered 2018-10-10: 1 mg via INTRAVENOUS
  Administered 2018-10-10: 2 mg via INTRAVENOUS
  Administered 2018-10-10 (×2): 1 mg via INTRAVENOUS
  Administered 2018-10-10: 2 mg via INTRAVENOUS
  Administered 2018-10-10 (×2): 1 mg via INTRAVENOUS
  Administered 2018-10-11 – 2018-10-12 (×4): 2 mg via INTRAVENOUS
  Filled 2018-10-05 (×21): qty 2

## 2018-10-05 MED ORDER — LORAZEPAM 2 MG/ML IJ SOLN
INTRAMUSCULAR | Status: AC
  Filled 2018-10-05: qty 1

## 2018-10-05 MED ORDER — PROPOFOL 1000 MG/100ML IV EMUL
INTRAVENOUS | Status: AC
  Filled 2018-10-05: qty 100

## 2018-10-05 MED ORDER — PANTOPRAZOLE SODIUM 40 MG IV SOLR
40.0000 mg | Freq: Every day | INTRAVENOUS | Status: DC
  Administered 2018-10-06 – 2018-10-09 (×4): 40 mg via INTRAVENOUS
  Filled 2018-10-05 (×4): qty 40

## 2018-10-05 MED ORDER — FENTANYL CITRATE (PF) 100 MCG/2ML IJ SOLN
50.0000 ug | Freq: Once | INTRAMUSCULAR | Status: AC
  Administered 2018-10-05: 50 ug via INTRAVENOUS

## 2018-10-05 MED ORDER — CALCIUM GLUCONATE-NACL 1-0.675 GM/50ML-% IV SOLN
1.0000 g | Freq: Once | INTRAVENOUS | Status: AC
  Administered 2018-10-05: 1000 mg via INTRAVENOUS
  Filled 2018-10-05: qty 50

## 2018-10-05 MED ORDER — MIDAZOLAM HCL 2 MG/2ML IJ SOLN
2.0000 mg | INTRAMUSCULAR | Status: AC | PRN
  Administered 2018-10-07 – 2018-10-08 (×3): 2 mg via INTRAVENOUS
  Filled 2018-10-05 (×9): qty 2

## 2018-10-05 MED ORDER — VANCOMYCIN HCL 10 G IV SOLR
1500.0000 mg | Freq: Once | INTRAVENOUS | Status: AC
  Administered 2018-10-05: 1500 mg via INTRAVENOUS
  Filled 2018-10-05: qty 1500

## 2018-10-05 MED ORDER — LORAZEPAM 2 MG/ML IJ SOLN
1.0000 mg | Freq: Once | INTRAMUSCULAR | Status: AC
  Administered 2018-10-05: 1 mg via INTRAVENOUS

## 2018-10-05 MED ORDER — CHLORHEXIDINE GLUCONATE 0.12% ORAL RINSE (MEDLINE KIT)
15.0000 mL | Freq: Two times a day (BID) | OROMUCOSAL | Status: DC
  Administered 2018-10-05 – 2018-10-12 (×14): 15 mL via OROMUCOSAL

## 2018-10-05 MED ORDER — INSULIN ASPART 100 UNIT/ML ~~LOC~~ SOLN
0.0000 [IU] | SUBCUTANEOUS | Status: DC
  Administered 2018-10-05 – 2018-10-07 (×8): 2 [IU] via SUBCUTANEOUS
  Administered 2018-10-08: 3 [IU] via SUBCUTANEOUS
  Administered 2018-10-08: 2 [IU] via SUBCUTANEOUS
  Administered 2018-10-08 (×3): 3 [IU] via SUBCUTANEOUS
  Administered 2018-10-08 – 2018-10-09 (×2): 2 [IU] via SUBCUTANEOUS
  Administered 2018-10-09 (×6): 3 [IU] via SUBCUTANEOUS
  Administered 2018-10-10: 5 [IU] via SUBCUTANEOUS
  Administered 2018-10-10: 3 [IU] via SUBCUTANEOUS
  Administered 2018-10-10: 8 [IU] via SUBCUTANEOUS
  Administered 2018-10-10 (×2): 5 [IU] via SUBCUTANEOUS
  Administered 2018-10-10: 3 [IU] via SUBCUTANEOUS
  Administered 2018-10-11: 5 [IU] via SUBCUTANEOUS
  Administered 2018-10-11 (×2): 8 [IU] via SUBCUTANEOUS

## 2018-10-05 MED ORDER — VANCOMYCIN HCL IN DEXTROSE 1-5 GM/200ML-% IV SOLN
1000.0000 mg | Freq: Two times a day (BID) | INTRAVENOUS | Status: DC
  Administered 2018-10-06: 1000 mg via INTRAVENOUS
  Filled 2018-10-05: qty 200

## 2018-10-05 MED ORDER — SODIUM CHLORIDE 0.9 % IV SOLN
2.0000 g | Freq: Two times a day (BID) | INTRAVENOUS | Status: AC
  Administered 2018-10-05 – 2018-10-10 (×11): 2 g via INTRAVENOUS
  Filled 2018-10-05 (×11): qty 2

## 2018-10-05 MED ORDER — SODIUM CHLORIDE 0.9 % IV SOLN
INTRAVENOUS | Status: DC
  Administered 2018-10-05: 22:00:00 via INTRAVENOUS

## 2018-10-05 MED ORDER — FENTANYL 2500MCG IN NS 250ML (10MCG/ML) PREMIX INFUSION
25.0000 ug/h | INTRAVENOUS | Status: DC
  Administered 2018-10-05: 50 ug/h via INTRAVENOUS
  Administered 2018-10-06: 200 ug/h via INTRAVENOUS
  Administered 2018-10-07: 125 ug/h via INTRAVENOUS
  Administered 2018-10-08: 175 ug/h via INTRAVENOUS
  Administered 2018-10-08 (×2): 300 ug/h via INTRAVENOUS
  Administered 2018-10-09: 275 ug/h via INTRAVENOUS
  Administered 2018-10-09: 250 ug/h via INTRAVENOUS
  Administered 2018-10-10: 125 ug/h via INTRAVENOUS
  Administered 2018-10-11: 50 ug/h via INTRAVENOUS
  Filled 2018-10-05 (×11): qty 250

## 2018-10-05 NOTE — ED Notes (Signed)
[name], Blue top sent down to [facility].   Dark Green on rocker in [employer].

## 2018-10-05 NOTE — ED Notes (Signed)
Pt placed on BIPAP at this time. ?

## 2018-10-05 NOTE — ED Notes (Signed)
EKG recaptured due to increased tachycardia, as well as noticeable changes. Given to EDP.

## 2018-10-05 NOTE — ED Provider Notes (Signed)
[name] EMERGENCY DEPARTMENT Provider Note   CSN: [id number] Arrival date & time: 10/05/18  1421     History   Chief Complaint Chief Complaint  Patient presents with  . Shortness of Breath  . Chest Pain    HPI [name] is a 62 y.o. male.  HPI   (Patient's granddaughter presented to bedside after intubation [name] [phone])  62 year old male with a past medical history of schizophrenia, PVD, spinal stenosis, diabetes presents today with complaints of shortness of breath.  Patient arrived via EMS, he notes that he woke up this morning in his usual state of health.  He notes developing pain in his left arm that developed into nonspecific chest pain.  Patient notes associated shortness of breath.  EMS was called and evaluated in house.  They note he was having shortness of breath, chest pain and hypoxic, he was given a breathing treatment aspirin and nitro which improved his chest pain, still having shortness of breath.  Patient denies any history of the same.  Denies any history of MI, heart failure.  Patient reports he is a smoker.  He denies any history DVT or PE.  No recent illnesses.  Patient notes over the last month he has had bilateral lower extremity swelling and edema.  He notes that he has difficulty laying back as it causes him to have shortness of breath.    Past Medical History:  Diagnosis Date  . Anxiety   . Bipolar disorder ([place])   . Chronic back pain   . Depression   . Diabetes mellitus without complication ([place])   . Diabetic neuropathy ([place])   . DVT (deep venous thrombosis) ([place])   . Enlarged prostate   . Headache   . Hepatitis    Hepatitis C - has been treated with Harvoni (cleared in July, 2017)  . History of colon polyps   . Peripheral vascular disease ([place])   . Pneumonia    hx of-about 71yrs ago  . Rhabdomyolysis 08/30/2018  . Schizophrenia ([place])   . Spinal stenosis     Patient Active Problem List   Diagnosis  Date Noted  . NSTEMI (non-ST elevated myocardial infarction) ([place]) 10/05/2018  . RUQ pain   . Hypokalemia   . Transaminitis   . Rhabdomyolysis 08/30/2018  . Acute lower UTI 03/24/2018  . Tobacco dependence 03/24/2018  . Cocaine abuse ([place]) 03/24/2018  . AKI (acute kidney injury) ([place]) 11/27/2017  . Acute urinary retention 11/27/2017  . Anemia due to blood loss 06/30/2017    Class: Acute  . Diabetes mellitus without complication ([place]) 18/56/3149    Class: Chronic  . Spinal stenosis of lumbar region 06/29/2017    Class: Chronic  . Cauda equina compression ([place]) 06/29/2017    Class: Acute  . Spondylolisthesis, lumbar region 06/29/2017    Class: Chronic  . History of lumbar spinal fusion 06/26/2017  . Myelopathy ([place]) 06/24/2017  . Low back pain 06/24/2017  . NPH (normal pressure hydrocephalus) ([place]) 10/30/2016  . Bilateral sciatica 09/01/2016  . Altered mental status 07/01/2016  . Acute encephalopathy 07/01/2016  . Hydrocephalus in adult [facility]) 07/01/2016  . Shaking spells 06/01/2016  . Gait disturbance 04/10/2015  . Numbness 04/10/2015  . Lumbar radicular pain 04/10/2015  . Lumbar spondylosis 04/10/2015  . Bipolar I disorder with depression, severe ([name]) 06/30/2014  . Suicidal ideations 06/30/2014  . Ulcer of lower limb, unspecified 02/20/2014  . CELLULITIS AND ABSCESS OF LEG EXCEPT FOOT 11/08/2007  Past Surgical History:  Procedure Laterality Date  . APPENDECTOMY     early 43's  . BACK SURGERY  05/2011   x 5  . blood clot in groin  early 80's  . CARPAL TUNNEL RELEASE     left  . CERVICAL SPINE SURGERY  2009  . COLONOSCOPY    . HERNIA REPAIR     early 2000's  . SHOULDER SURGERY  70's   right  . VENTRICULOPERITONEAL SHUNT N/A 10/30/2016   Procedure: Right Ventricular Peritoneal Shunt;  Surgeon: Kary Kos, MD;  Location: [place];  Service: Neurosurgery;  Laterality: N/A;        Home Medications    Prior to Admission medications   Medication Sig Start Date End  Date Taking? Authorizing Provider  cholecalciferol (VITAMIN D) 1000 units tablet Take 1,000 Units by mouth daily.    [provider]  ferrous gluconate (FERGON) 324 MG tablet Take 1 tablet (324 mg total) by mouth 2 (two) times daily with a meal. 06/30/17   [name], MD  finasteride (PROSCAR) 5 MG tablet Take 5 mg by mouth daily.  12/09/17   [provider]  gabapentin (NEURONTIN) 300 MG capsule Take 1 capsule (300 mg total) by mouth at bedtime. 06/29/17   [name], MD  ibuprofen (ADVIL,MOTRIN) 600 MG tablet Take 600 mg by mouth daily.  05/27/17   [provider]  INVEGA SUSTENNA 234 MG/1.5ML SUSP injection Inject 234 mg into the muscle every 30 (thirty) days.  12/08/17   [provider]  lidocaine (LIDODERM) 5 % Place 1 patch onto the skin daily. Remove & Discard patch within 12 hours or as directed by MD 03/24/18   Maczis, Barth Kirks, PA-C  metFORMIN (GLUCOPHAGE) 500 MG tablet Take 500 mg by mouth daily with breakfast.     [provider]  nicotine (NICODERM CQ - DOSED IN MG/24 HR) 7 mg/24hr patch Place 1 patch (7 mg total) onto the skin daily. 03/24/18   Maczis, Barth Kirks, PA-C  paliperidone (INVEGA) 3 MG 24 hr tablet Take 1 tablet (3 mg total) by mouth at bedtime. 06/29/17   [name], MD  polyethylene glycol (MIRALAX / Floria Raveling) packet Take 17 g by mouth daily. 09/09/18   Ghimire, Henreitta Leber, MD  sertraline (ZOLOFT) 100 MG tablet Take 100 mg by mouth at bedtime.    [provider]  sitaGLIPtin (JANUVIA) 100 MG tablet Take 100 mg by mouth daily with breakfast.    [provider]  tamsulosin (FLOMAX) 0.4 MG CAPS capsule Take 0.4 mg by mouth daily after supper.     [provider]  traZODone (DESYREL) 150 MG tablet Take 0.5 tablets (75 mg total) by mouth at bedtime. 11/29/17   Patrecia Pour, MD    Family History Family History  Problem Relation Age of Onset  . Heart attack Father   . Anesthesia problems Neg Hx   .  Hypotension Neg Hx   . Malignant hyperthermia Neg Hx   . Pseudochol deficiency Neg Hx     Social History Social History   Tobacco Use  . Smoking status: Current Every Day Smoker    Packs/day: 1.00    Years: 40.00    Pack years: 40.00    Types: Cigarettes  . Smokeless tobacco: Never Used  Substance Use Topics  . Alcohol use: Yes    Comment: occasional use  . Drug use: Yes    Types: Cocaine, Marijuana     Allergies  Penicillins   Review of Systems Review of Systems  All other systems reviewed and are negative.  Physical Exam Updated Vital Signs BP 103/66   Pulse (!) 142   Temp 99.5 F (37.5 C) (Axillary)   Resp 13   Ht 5\' 11"  (1.803 m)   Wt 81.6 kg   SpO2 96%   BMI 25.10 kg/m   Physical Exam  Constitutional: He is oriented to person, place, and time. He appears well-developed and well-nourished.  HENT:  Head: Normocephalic and atraumatic.  Eyes: Pupils are equal, round, and reactive to light. Conjunctivae are normal. Right eye exhibits no discharge. Left eye exhibits no discharge. No scleral icterus.  Neck: Normal range of motion. No JVD present. No tracheal deviation present.  Cardiovascular: Regular rhythm, normal heart sounds and intact distal pulses.  Pulmonary/Chest: Effort normal. No stridor.  Poor respiratory effort, bilateral crackles  Abdominal:  Minor distended abdomen nontender  Musculoskeletal:  Bilateral pitting edema symmetrical  Neurological: He is alert and oriented to person, place, and time. Coordination normal.  Skin: Skin is warm.  Psychiatric: He has a normal mood and affect. His behavior is normal. Judgment and thought content normal.  Nursing note and vitals reviewed.    ED Treatments / Results  Labs (all labs ordered are listed, but only abnormal results are displayed) Labs Reviewed  BRAIN NATRIURETIC PEPTIDE - Abnormal; Notable for the following components:      Result Value   B Natriuretic Peptide 779.2 (*)    All other  components within normal limits  BASIC METABOLIC PANEL - Abnormal; Notable for the following components:   Potassium 3.2 (*)    Glucose, Bld 240 (*)    Creatinine, Ser 1.32 (*)    Calcium 8.8 (*)    GFR calc non Af Amer 57 (*)    All other components within normal limits  D-DIMER, QUANTITATIVE (NOT AT [facility]) - Abnormal; Notable for the following components:   D-Dimer, Quant 1.50 (*)    All other components within normal limits  TROPONIN I - Abnormal; Notable for the following components:   Troponin I 5.34 (*)    All other components within normal limits  CBC WITH DIFFERENTIAL/PLATELET - Abnormal; Notable for the following components:   WBC 17.5 (*)    RBC 3.27 (*)    Hemoglobin 10.2 (*)    HCT 32.5 (*)    Neutro Abs 12.3 (*)    Monocytes Absolute 1.4 (*)    Abs Immature Granulocytes 0.15 (*)    All other components within normal limits  I-STAT TROPONIN, ED - Abnormal; Notable for the following components:   Troponin i, poc 4.22 (*)    All other components within normal limits  I-STAT ARTERIAL BLOOD GAS, ED - Abnormal; Notable for the following components:   pO2, Arterial 67.0 (*)    All other components within normal limits  I-STAT CHEM 8, ED - Abnormal; Notable for the following components:   Glucose, Bld 239 (*)    Calcium, Ion 1.14 (*)    Hemoglobin 10.2 (*)    HCT 30.0 (*)    All other components within normal limits  I-STAT ARTERIAL BLOOD GAS, ED - Abnormal; Notable for the following components:   pH, Arterial 7.341 (*)    pO2, Arterial 47.0 (*)    All other components within normal limits  I-STAT ARTERIAL BLOOD GAS, ED - Abnormal; Notable for the following components:   pO2, Arterial 56.0 (*)    All other components within  normal limits  CULTURE, BLOOD (ROUTINE X 2)  CULTURE, BLOOD (ROUTINE X 2)  URINE CULTURE  CULTURE, RESPIRATORY  RAPID URINE DRUG SCREEN, HOSP PERFORMED  CBC WITH DIFFERENTIAL/PLATELET  HEPARIN LEVEL (UNFRACTIONATED)  TROPONIN I  TROPONIN I    HEPARIN LEVEL (UNFRACTIONATED)  CBC  BASIC METABOLIC PANEL  BLOOD GAS, ARTERIAL  MAGNESIUM  PHOSPHORUS  PROCALCITONIN  PROCALCITONIN    EKG EKG Interpretation  Date/Time:  Wednesday October 05 2018 14:31:13 EST Ventricular Rate:  116 PR Interval:    QRS Duration: 78 QT Interval:  325 QTC Calculation: 452 R Axis:   -105 Text Interpretation:  Sinus tachycardia Probable left atrial enlargement Left anterior fascicular block Abnormal R-wave progression, early transition Nonspecific repol abnormality, diffuse leads ST depression new since this morning  Confirmed by Wandra Arthurs ([id number]) on 10/05/2018 3:56:18 PM   Radiology Dg Chest Port 1 View  Result Date: 10/05/2018 CLINICAL DATA:  ET tube advanced, check position. EXAM: PORTABLE CHEST 1 VIEW COMPARISON:  Portable film earlier today. FINDINGS: ET tube now 5.9 cm above carina. Worsening aeration, with increasingly dense BILATERAL lung opacities. IMPRESSION: ET tube now 5.9 cm above carina.  Worsening aeration. Electronically Signed   By: Staci Righter M.D.   On: 10/05/2018 19:27   Dg Chest Port 1 View  Result Date: 10/05/2018 CLINICAL DATA:  Intubation. EXAM: PORTABLE CHEST 1 VIEW COMPARISON:  Portable film earlier today. FINDINGS: ET tube is been placed and lies 6.5 cm above the carina. Orogastric tube has also been placed, and terminates below the limits of the exposure. The heart is enlarged. BILATERAL pulmonary opacities are worse, consistent with pulmonary edema or diffuse pneumonia. IMPRESSION: ETT placement as described, 6.5 cm above carina. Worsening aeration, favored to represent pulmonary edema. Electronically Signed   By: Staci Righter M.D.   On: 10/05/2018 17:41   Dg Chest Portable 1 View  Result Date: 10/05/2018 CLINICAL DATA:  Hypoxia EXAM: PORTABLE CHEST 1 VIEW COMPARISON:  09/06/2018, 03/23/2018 FINDINGS: Moderate interstitial and ground-glass opacity bilaterally with small pleural effusions. Cardiomegaly. No  pneumothorax. IMPRESSION: Cardiomegaly. Moderate bilateral interstitial and ground-glass opacity which may reflect pulmonary edema or diffuse pneumonia. There are small bilateral pleural effusions. Electronically Signed   By: Donavan Foil M.D.   On: 10/05/2018 16:44    Procedures Procedures (including critical care time)  CRITICAL CARE Performed by: Stevie Kern Vergene Marland   Total critical care time: 60 minutes  Critical care time was exclusive of separately billable procedures and treating other patients.  Critical care was necessary to treat or prevent imminent or life-threatening deterioration.  Critical care was time spent personally by me on the following activities: development of treatment plan with patient and/or surrogate as well as nursing, discussions with consultants, evaluation of patient's response to treatment, examination of patient, obtaining history from patient or surrogate, ordering and performing treatments and interventions, ordering and review of laboratory studies, ordering and review of radiographic studies, pulse oximetry and re-evaluation of patient's condition.  Medications Ordered in ED Medications  ipratropium-albuterol (DUONEB) 0.5-2.5 (3) MG/3ML nebulizer solution 3 mL (3 mLs Nebulization Not Given 10/05/18 1543)  heparin ADULT infusion 100 units/mL (25000 units/227mL sodium chloride 0.45%) (1,000 Units/hr Intravenous New Bag/Given 10/05/18 1734)  pantoprazole (PROTONIX) injection 40 mg (has no administration in time range)  0.9 %  sodium chloride infusion (has no administration in time range)  insulin aspart (novoLOG) injection 0-15 Units (has no administration in time range)  calcium gluconate 1 g/ 50 mL sodium chloride IVPB (has  no administration in time range)  fentaNYL (SUBLIMAZE) injection 50 mcg (has no administration in time range)  fentaNYL 2540mcg in NS 235mL (32mcg/ml) infusion-PREMIX (has no administration in time range)  fentaNYL (SUBLIMAZE) bolus  via infusion 50 mcg (has no administration in time range)  midazolam (VERSED) injection 2 mg (has no administration in time range)  midazolam (VERSED) injection 2 mg (has no administration in time range)  ceFEPIme (MAXIPIME) 2 g in sodium chloride 0.9 % 100 mL IVPB (has no administration in time range)  heparin bolus via infusion 4,000 Units (4,000 Units Intravenous Bolus from Bag 10/05/18 1734)  LORazepam (ATIVAN) injection 1 mg (1 mg Intravenous Given 10/05/18 1639)  propofol (DIPRIVAN) 1000 MG/100ML infusion (15 mcg/kg/min  81.6 kg Intravenous Rate/Dose Change 10/05/18 1903)  furosemide (LASIX) injection 80 mg (80 mg Intravenous Given 10/05/18 1902)     Initial Impression / Assessment and Plan / ED Course  I have reviewed the triage vital signs and the nursing notes.  Pertinent labs & imaging results that were available during my care of the patient were reviewed by me and considered in my medical decision making (see chart for details).    Labs: BNP, CBC, BMP  Imaging: DG chest 2 view   Consults: Dr. Tamala Julian Cardiology, Critical Care   Assessment/Plan: 61 year old male presents today with shortness of breath.  Patient initially with crackles and respiratory distress.  Significantly hypoxic which improved with BiPAP.  Patient became increasingly agitated and attempting to pull machines off, he could not be calmed down enough to provide ongoing management he continued to have decreased oxygen saturation.  He was intubated to stabilize his respiratory status.  Repeat EKG was performed showing some dynamic changes, this was discussed with on-call STEMI doctor Dr. Tamala Julian.  No immediate patient for Cath Lab management with recommendation for ongoing cardiology consultation and hospitalization.  Unable to reach cardiology for repeat discussion, critical care seeing patient here in the ED for ongoing evaluation management.    Final Clinical Impressions(s) / ED Diagnoses   Final diagnoses:    Non-STEMI (non-ST elevated myocardial infarction) ([place])  Acute respiratory failure, unspecified whether with hypoxia or hypercapnia [facility])    ED Discharge Orders    None       Francee Gentile 10/05/18 2051    Gareth Morgan, MD 10/05/18 2344

## 2018-10-05 NOTE — ED Notes (Signed)
Pt informed to use urinal to provide UA when possible. Pt verbalized understanding.

## 2018-10-05 NOTE — ED Notes (Signed)
RRT at the bedside

## 2018-10-05 NOTE — H&P (Signed)
NAME:  [name], MRN:  [id number], DOB:  1956-11-19, LOS: 0 ADMISSION DATE:  10/05/2018, CONSULTATION DATE:  10/05/18 REFERRING MD:  Darl Householder CHIEF COMPLAINT:  SOB   Brief History   [name] is a 62 y.o. male who was admitted 11/6 with NSTEMI and acute hypoxic respiratory failure requiring intubation.  Past Medical History  Spinal stenosis, shizhophrenia, bipolar disorder, depression, anxiety, polysubstance abuse, rhabdo, PVD, DVT, HCV s/p treatment with Harvoni, diabetic neuropathy, DM.  Significant Hospital Events   11/6 > admit.  Consults: date of consult/date signed off & final recs:  Cardiology 11/6 >   Procedures (surgical and bedside):  ETT 11/6 >   Significant Diagnostic Tests:  CXR 11/6 > cardiomegaly, small b/l effusions, b/l GGO's. Echo 11/7 >  LE duplex 11/7 >   Micro Data:  Blood 11/6 >  Sputum 11/6 >  Urine 11/6 >   Antimicrobials:  Vanc 11/6 >  Cefepime 11/6 >   Subjective:  Sedated, comfortable.  Objective:  Blood pressure 95/67, pulse (!) 142, temperature 99.5 F (37.5 C), temperature source Axillary, resp. rate (!) 7, height 5\' 11"  (1.803 m), weight 81.6 kg, SpO2 96 %.    Vent Mode: PRVC FiO2 (%):  [100 %] 100 % Set Rate:  [18 bmp] 18 bmp Vt Set:  [600 mL] 600 mL PEEP:  [5 cmH20-10 cmH20] 10 cmH20 Pressure Support:  [12 cmH20] 12 cmH20 Plateau Pressure:  [25 cmH20-26 cmH20] 26 cmH20  No intake or output data in the 24 hours ending 10/05/18 2042 Filed Weights   10/05/18 1600  Weight: 81.6 kg    Examination: General: Adult male, in NAD. Neuro: Sedated, unresponsive. HEENT: [place]/AT. Sclerae anicteric.  ETT in place. Cardiovascular: RRR, no M/R/G.  Lungs: Respirations even and unlabored.  CTA bilaterally, No W/R/R. Abdomen: BS x 4, soft, NT/ND.  Musculoskeletal: No gross deformities, 1+ edema.  Skin:Dry, Intact, warm, no rashes.  Assessment & Plan:   NSTEMI. - Continue heparin gtt. - Trend troponins. - F/u echo. - Cardiology  following.  Acute hypoxic respiratory failure - failed BiPAP and required intubation. - Continue full vent support. - Wean as able. - Bronchial hygiene. - Follow ABG, CXR.  Possible HCAP. - Empiric abx with vanc / cefepime for now. - Follow cultures. - Assess PCT, de-escalate vs d/c abx if low.  ? DVT - cards concerned given recent sedentary state.  D-dimer very mildly elevated. - F/u on LE duplex.  DM. - SSI. - Hold preadmission invega, metformin, sitagliptin.  Hypocalcemia. - 1g Ca gluconate. - Follow BMP.  Hx substance abuse (UDS on prior admits positive for cocaine), schizophrenia, bipolar, anxiety, depression. - Substance abuse counseling. - F/u on UDS. - Hold preadmission gabapentin, sertraline, trazodone.  Disposition / Summary of Today's Plan 10/05/18   ICU. Continue heparin gtt for NSTEMI.  Cardiology following. Follow cultures, PCT. F/u echo, LE duplex.    Diet: NPO. Pain/Anxiety/Delirium protocol (if indicated): Fentanyl gtt / Midazolam PRN.  RASS goal 0 to -1. VAP protocol (if indicated): In place. DVT prophylaxis: Heparin. GI prophylaxis: Pantoprazole. Hyperglycemia protocol: SSI. Mobility: Bedrest. Code Status: Full. Family Communication: None available.  Labs   CBC: Recent Labs  Lab 10/05/18 1715 10/05/18 1743  WBC  --  17.5*  NEUTROABS  --  12.3*  HGB 10.2* 10.2*  HCT 30.0* 32.5*  MCV  --  99.4  PLT  --  030   Basic Metabolic Panel: Recent Labs  Lab 10/05/18 1707 10/05/18 1715  NA 140  142  K 3.2* 3.6  CL 107 106  CO2 23  --   GLUCOSE 240* 239*  BUN 13 13  CREATININE 1.32* 1.20  CALCIUM 8.8*  --    GFR: Estimated Creatinine Clearance: 68.9 mL/min (by C-G formula based on SCr of 1.2 mg/dL). Recent Labs  Lab 10/05/18 1743  WBC 17.5*   Liver Function Tests: No results for input(s): AST, ALT, ALKPHOS, BILITOT, PROT, ALBUMIN in the last 168 hours. No results for input(s): LIPASE, AMYLASE in the last 168 hours. No results for  input(s): AMMONIA in the last 168 hours. ABG    Component Value Date/Time   PHART 7.359 10/05/2018 1951   PCO2ART 43.9 10/05/2018 1951   PO2ART 56.0 (L) 10/05/2018 1951   HCO3 24.7 10/05/2018 1951   TCO2 26 10/05/2018 1951   ACIDBASEDEF 1.0 10/05/2018 1951   O2SAT 87.0 10/05/2018 1951    Coagulation Profile: No results for input(s): INR, PROTIME in the last 168 hours. Cardiac Enzymes: Recent Labs  Lab 10/05/18 1707  TROPONINI 5.34*   HbA1C: Hemoglobin A1C  Date/Time Value Ref Range Status  06/15/2014 05:42 AM 6.5 (H) 4.2 - 6.3 % Final    Comment:    The American Diabetes Association recommends that a primary goal of therapy should be <7% and that physicians should reevaluate the treatment regimen in patients with HbA1c values consistently >8%.    Hgb A1c MFr Bld  Date/Time Value Ref Range Status  11/27/2017 02:03 PM 5.1 4.8 - 5.6 % Final    Comment:    (NOTE) Pre diabetes:          5.7%-6.4% Diabetes:              >6.4% Glycemic control for   <7.0% adults with diabetes   06/26/2017 04:39 AM 5.5 4.8 - 5.6 % Final    Comment:    (NOTE)         Pre-diabetes: 5.7 - 6.4         Diabetes: >6.4         Glycemic control for adults with diabetes: <7.0    CBG: No results for input(s): GLUCAP in the last 168 hours.  Admitting History of Present Illness.   Pt is encephelopathic; therefore, this HPI is obtained from chart review. [name] is a 62 y.o. male who has a PMH as outlined below.  He presented to [facility] ED 11/6 with SOB and chest pain x 2 days.  Symptoms first started with exertion only then progressed to midsternal, non-radiating chest pain without other associated symptoms.  He called EMS who administered ASA and SL NTG which did help his pain somewhat but did not fully resolve it.  He was also found to be hypoxic so was placed on a NRB.    In ED, he was transitioned to BiPAP for increased WOB and hypoxia.  He later began to not tolerate this and began to  attempt removing it as well as IV lines.  Due to persistent SpO2 in 70's, he was intubated.  He was seen by cardiology for NSTEMI and was started on heparin gtt.  Of note, he had recent admission 10/1 through 10/10 for rhabdo, possible PNA and small pericardial effusion.  He was to follow up with PCP as outpatient.  We are unsure whether he did so or not.  Review of Systems:   Unable to obtain as pt is encephalopathic.  Past medical history  He,  has a past medical history of Anxiety,  Bipolar disorder ([place]), Chronic back pain, Depression, Diabetes mellitus without complication ([place]), Diabetic neuropathy ([place]), DVT (deep venous thrombosis) ([place]), Enlarged prostate, Headache, Hepatitis, History of colon polyps, Peripheral vascular disease ([place] [address]), Pneumonia, Rhabdomyolysis (08/30/2018), Schizophrenia ([place]), and Spinal stenosis.   Surgical History    Past Surgical History:  Procedure Laterality Date  . APPENDECTOMY     early 2's  . BACK SURGERY  05/2011   x 5  . blood clot in groin  early 80's  . CARPAL TUNNEL RELEASE     left  . CERVICAL SPINE SURGERY  2009  . COLONOSCOPY    . HERNIA REPAIR     early 2000's  . SHOULDER SURGERY  70's   right  . VENTRICULOPERITONEAL SHUNT N/A 10/30/2016   Procedure: Right Ventricular Peritoneal Shunt;  Surgeon: Kary Kos, MD;  Location: [place];  Service: Neurosurgery;  Laterality: N/A;     Social History   Social History   Socioeconomic History  . Marital status: Single    Spouse name: Not on file  . Number of children: Not on file  . Years of education: Not on file  . Highest education level: Not on file  Occupational History  . Occupation: disabled  Social Needs  . Financial resource strain: Not on file  . Food insecurity:    Worry: Not on file    Inability: Not on file  . Transportation needs:    Medical: Not on file    Non-medical: Not on file  Tobacco Use  . Smoking status: Current Every Day Smoker    Packs/day: 1.00    Years:  40.00    Pack years: 40.00    Types: Cigarettes  . Smokeless tobacco: Never Used  Substance and Sexual Activity  . Alcohol use: Yes    Comment: occasional use  . Drug use: Yes    Types: Cocaine, Marijuana  . Sexual activity: Yes  Lifestyle  . Physical activity:    Days per week: Not on file    Minutes per session: Not on file  . Stress: Not on file  Relationships  . Social connections:    Talks on phone: Not on file    Gets together: Not on file    Attends religious service: Not on file    Active member of club or organization: Not on file    Attends meetings of clubs or organizations: Not on file    Relationship status: Not on file  . Intimate partner violence:    Fear of current or ex partner: Not on file    Emotionally abused: Not on file    Physically abused: Not on file    Forced sexual activity: Not on file  Other Topics Concern  . Not on file  Social History Narrative  . Not on file  ,  reports that he has been smoking cigarettes. He has a 40.00 pack-year smoking history. He has never used smokeless tobacco. He reports that he drinks alcohol. He reports that he has current or past drug history. Drugs: Cocaine and Marijuana.   Family history   His family history includes Heart attack in his father. There is no history of Anesthesia problems, Hypotension, Malignant hyperthermia, or Pseudochol deficiency.   Allergies Allergies  Allergen Reactions  . Penicillins Hives and Other (See Comments)    Has patient had a PCN reaction causing immediate rash, facial/tongue/throat swelling, SOB or lightheadedness with hypotension: No Has patient had a PCN reaction causing severe rash involving mucus membranes or  skin necrosis: No Has patient had a PCN reaction that required hospitalization No Has patient had a PCN reaction occurring within the last 10 years: No If all of the above answers are "NO", then may proceed with Cephalosporin use.    Home meds  Prior to Admission  medications   Medication Sig Start Date End Date Taking? Authorizing Provider  cholecalciferol (VITAMIN D) 1000 units tablet Take 1,000 Units by mouth daily.    [provider]  ferrous gluconate (FERGON) 324 MG tablet Take 1 tablet (324 mg total) by mouth 2 (two) times daily with a meal. 06/30/17   [name], MD  finasteride (PROSCAR) 5 MG tablet Take 5 mg by mouth daily.  12/09/17   [provider]  gabapentin (NEURONTIN) 300 MG capsule Take 1 capsule (300 mg total) by mouth at bedtime. 06/29/17   [name], MD  ibuprofen (ADVIL,MOTRIN) 600 MG tablet Take 600 mg by mouth daily.  05/27/17   [provider]  INVEGA SUSTENNA 234 MG/1.5ML SUSP injection Inject 234 mg into the muscle every 30 (thirty) days.  12/08/17   [provider]  lidocaine (LIDODERM) 5 % Place 1 patch onto the skin daily. Remove & Discard patch within 12 hours or as directed by MD 03/24/18   Maczis, Barth Kirks, PA-C  metFORMIN (GLUCOPHAGE) 500 MG tablet Take 500 mg by mouth daily with breakfast.     [provider]  nicotine (NICODERM CQ - DOSED IN MG/24 HR) 7 mg/24hr patch Place 1 patch (7 mg total) onto the skin daily. 03/24/18   Maczis, Barth Kirks, PA-C  paliperidone (INVEGA) 3 MG 24 hr tablet Take 1 tablet (3 mg total) by mouth at bedtime. 06/29/17   [name], MD  polyethylene glycol (MIRALAX / Floria Raveling) packet Take 17 g by mouth daily. 09/09/18   Ghimire, Henreitta Leber, MD  sertraline (ZOLOFT) 100 MG tablet Take 100 mg by mouth at bedtime.    [provider]  sitaGLIPtin (JANUVIA) 100 MG tablet Take 100 mg by mouth daily with breakfast.    [provider]  tamsulosin (FLOMAX) 0.4 MG CAPS capsule Take 0.4 mg by mouth daily after supper.     [provider]  traZODone (DESYREL) 150 MG tablet Take 0.5 tablets (75 mg total) by mouth at bedtime. 11/29/17   Patrecia Pour, MD    Critical care time: 35 min.    Montey Hora, [facility] Pager: [phone]  or [phone] 10/05/2018, 8:42 PM

## 2018-10-05 NOTE — ED Triage Notes (Signed)
Per [facility], pt reports chest pain and SOB  X 1 week. Pt is a current smoker. Pt received 1 duoneb, 324 ASA and 1 NTG and is now pain free. Pt was 70% on RA with fire. Pt got to 90% on NRB. EMS denies hx of CHF, copd, or cardiac hx .

## 2018-10-05 NOTE — ED Notes (Signed)
Respiratory at bedside.

## 2018-10-05 NOTE — ED Notes (Signed)
23 AT THE LIP, +COLOR CHANGE, LUNGS DIMINISHED

## 2018-10-05 NOTE — ED Notes (Signed)
ED Provider at bedside. Respiratory notified of pt arrival.

## 2018-10-05 NOTE — Progress Notes (Addendum)
Pharmacy Antibiotic Note  [name] is a 62 y.o. male admitted on 10/05/2018 with pneumonia.  Pharmacy has been consulted for vancomycin and cefepime dosing. Patient noted to have penicillin allergy and has tolerated cefepime in the past.    Currently, patient is afebrile with elevated WBC 17.5. Scr 1.2 mg/dL (BL ~1 mg/dL), estimated CrCl 68.9 mL/min.  Plan: Cefepime 2 gm IV q12h Vancomycin 1500 mg IV once, then 1000 mg IV q12h Monitor renal function and vancomycin trough at steady-state F/u cultures and length of therapy  Height: 5\' 11"  (180.3 cm) Weight: 180 lb (81.6 kg) IBW/kg (Calculated) : 75.3  Temp (24hrs), Avg:99.5 F (37.5 C), Min:99.5 F (37.5 C), Max:99.5 F (37.5 C)  Recent Labs  Lab 10/05/18 1707 10/05/18 1715 10/05/18 1743  WBC  --   --  17.5*  CREATININE 1.32* 1.20  --     Estimated Creatinine Clearance: 68.9 mL/min (by C-G formula based on SCr of 1.2 mg/dL).    Allergies  Allergen Reactions  . Penicillins Hives and Other (See Comments)    Has patient had a PCN reaction causing immediate rash, facial/tongue/throat swelling, SOB or lightheadedness with hypotension: No Has patient had a PCN reaction causing severe rash involving mucus membranes or skin necrosis: No Has patient had a PCN reaction that required hospitalization No Has patient had a PCN reaction occurring within the last 10 years: No If all of the above answers are "NO", then may proceed with Cephalosporin use.    Antimicrobials this admission: Cefepime 11/6>> Vancomycin 11/6>>  Microbiology results: 11/6 BCx: 11/6 UCx: 11/6 TA:  Thank you for allowing pharmacy to be a part of this patient's care.  Willia Craze, Pharmacy Student

## 2018-10-05 NOTE — Progress Notes (Signed)
ANTICOAGULATION CONSULT NOTE - Initial Consult  Pharmacy Consult for heparin Indication: chest pain/ACS  Allergies  Allergen Reactions  . Penicillins Hives and Other (See Comments)    Has patient had a PCN reaction causing immediate rash, facial/tongue/throat swelling, SOB or lightheadedness with hypotension: No Has patient had a PCN reaction causing severe rash involving mucus membranes or skin necrosis: No Has patient had a PCN reaction that required hospitalization No Has patient had a PCN reaction occurring within the last 10 years: No If all of the above answers are "NO", then may proceed with Cephalosporin use.    Patient Measurements: Weight: 180 lb (81.6 kg)  Vital Signs: Temp: 99.5 F (37.5 C) (11/06 1430) Temp Source: Axillary (11/06 1430) BP: 131/89 (11/06 1600) Pulse Rate: 109 (11/06 1600)  Labs: No results for input(s): HGB, HCT, PLT, APTT, LABPROT, INR, HEPARINUNFRC, HEPRLOWMOCWT, CREATININE, CKTOTAL, CKMB, TROPONINI in the last 72 hours.  CrCl cannot be calculated (Patient's most recent lab result is older than the maximum 21 days allowed.).   Medical History: Past Medical History:  Diagnosis Date  . Anxiety   . Bipolar disorder ([place])   . Chronic back pain   . Depression   . Diabetes mellitus without complication ([place])   . Diabetic neuropathy ([place])   . DVT (deep venous thrombosis) ([place])   . Enlarged prostate   . Headache   . Hepatitis    Hepatitis C - has been treated with Harvoni (cleared in July, 2017)  . History of colon polyps   . Peripheral vascular disease ([place])   . Pneumonia    hx of-about 79yrs ago  . Rhabdomyolysis 08/30/2018  . Schizophrenia ([place])   . Spinal stenosis     Medications:  Scheduled:  . heparin  4,000 Units Intravenous Once  . ipratropium-albuterol  3 mL Nebulization Once    Assessment: 88 YOM presenting with CP,  Pharmacy consulted for heparin, no AC PTA.  Goal of Therapy:  Heparin level 0.3-0.7 units/ml Monitor  platelets by anticoagulation protocol: Yes   Plan:  Heparin 4000 units IV x 1, and gtt at 1000 units/hr 6 hour heparin level to confirm Daily CBC, heparin level, s/s bleeding  Bertis Ruddy 10/05/2018,4:19 PM

## 2018-10-05 NOTE — ED Notes (Signed)
Pt started pulling clothing off as well as bipap, asking this rn to unhook him. Pt attempting to climb out of bed. EDP to the bedside.

## 2018-10-05 NOTE — Progress Notes (Signed)
ET tube advanced, per order, 3 cm.  Now measuring 26 cm @ lip.

## 2018-10-05 NOTE — ED Notes (Signed)
Updated PA critical troponin 5.34

## 2018-10-05 NOTE — Progress Notes (Signed)
Arterial gas drawn on the following settings [id number]%+10.

## 2018-10-05 NOTE — ED Provider Notes (Signed)
INTUBATION Performed by: [name]  Required items: required blood products, implants, devices, and special equipment available Patient identity confirmed: provided demographic data and hospital-assigned identification number Time out: Immediately prior to procedure a "time out" was called to verify the correct patient, procedure, equipment, support staff and site/side marked as required.  Indications: Respiratory distress  Intubation method: Glidescope Laryngoscopy   Preoxygenation: BVM  Sedatives: Etomidate Paralytic: Rocuronium Tube Size: 7.5 cuffed  Post-procedure assessment: chest rise and ETCO2 monitor Breath sounds: equal and absent over the epigastrium Tube secured with: ETT holder Chest x-ray interpreted by radiologist and me.  Chest x-ray findings: ET placed 6.5 cm above carina-readjusted to 5.9 cm above carina  Patient tolerated the procedure well with no immediate complications.      Okey Regal, PA-C 10/05/18 2048    Drenda Freeze, MD 10/05/18 [phone]

## 2018-10-05 NOTE — Consult Note (Addendum)
Consultation Note     Patient ID: [name] MRN: [id number], DOB/AGE: 61-08-1956   Admit date: 10/05/2018 Primary Physician: Nolene Ebbs, MD Primary Cardiologist: New  Patient Profile    Mr. [name] is a 62 year old male with a history of schizophrenia, PVD, spinal stenosis, DM 2 chronic back pain who presented to [facility] on 10/05/2018 with complaints of shortness of breath and chest pain found to have positive troponin at 4.22.  Past Medical History   Past Medical History:  Diagnosis Date  . Anxiety   . Bipolar disorder ([place])   . Chronic back pain   . Depression   . Diabetes mellitus without complication ([place])   . Diabetic neuropathy ([place])   . DVT (deep venous thrombosis) ([place])   . Enlarged prostate   . Headache   . Hepatitis    Hepatitis C - has been treated with Harvoni (cleared in July, 2017)  . History of colon polyps   . Peripheral vascular disease ([place])   . Pneumonia    hx of-about 67yrs ago  . Rhabdomyolysis 08/30/2018  . Schizophrenia ([place])   . Spinal stenosis     Past Surgical History:  Procedure Laterality Date  . APPENDECTOMY     early 24's  . BACK SURGERY  05/2011   x 5  . blood clot in groin  early 80's  . CARPAL TUNNEL RELEASE     left  . CERVICAL SPINE SURGERY  2009  . COLONOSCOPY    . HERNIA REPAIR     early 2000's  . SHOULDER SURGERY  70's   right  . VENTRICULOPERITONEAL SHUNT N/A 10/30/2016   Procedure: Right Ventricular Peritoneal Shunt;  Surgeon: Kary Kos, MD;  Location: [facility];  Service: Neurosurgery;  Laterality: N/A;    Allergies  Allergies  Allergen Reactions  . Penicillins Hives and Other (See Comments)    Has patient had a PCN reaction causing immediate rash, facial/tongue/throat swelling, SOB or lightheadedness with hypotension: No Has patient had a PCN reaction causing severe rash involving mucus membranes or skin necrosis: No Has patient had a PCN reaction that required hospitalization No Has patient had a PCN reaction  occurring within the last 10 years: No If all of the above answers are "NO", then may proceed with Cephalosporin use.   History of Present Illness    Mr. [name] is a 62 year old male with a history stated above who presented to [facility] on 10/05/2017 with complaints of shortness of breath and chest pain for 1 to 2 days.  He states that several days back he began experiencing exertional and resting shortness of breath.  Yesterday he states he began having midsternal, nonradiating, nonexertional chest pain without associated symptoms such as diaphoresis, nausea, vomiting, dizziness, presyncopal or syncopal episodes.  He states his symptoms began while at his home and lasted for several hours. History is extremely difficult to obtain secondary to patient's current somnolence. Given the above symptoms, he called EMS for transport to the emergency department for further evaluation.  Upon EMS arrival, he received 1 DuoNeb treatment and 324 mg ASA and 1SL NTG with chest pain relief.  His oxygen saturation was found to be in the 60s to 70s on room air.  He was placed on a nonrebreather and subsequently placed on BiPAP ventilation here in the emergency department.  He reports a history of tobacco use however denies alcohol and illicit drug use however, he has been positive for cocaine on recent UDS. He denies prior personal  or family history of CAD states he has been compliant with his medications.   The ED, EKG revealed sinus tachycardia with poor R wave progression and no acute ischemic changes.  Troponin found to be markedly elevated at 4.22.  Patient is currently chest pain-free.  BNP was found to be elevated at 779.2 with a CXR with pending results.  Last echocardiogram from 09/07/2018 with LVEF of 60 to 65% with possible hypokinesis of the apical inferior lateral myocardium no comparison study.  Of note, the she was recently discharged on 09/08/2017 from a hospital admission related to rhabdomyolysis with bilateral  muscle myopathy, hypokalemia, questionable pneumonia and small pericardial effusion seen on CT scan.  Initial CK levels were greater than 50,000 and trended to 1557 on day of discharge with he was discharged to [facility] with plans to follow with his PCP in 1 to 2 weeks however patient cannot recall if he has recently seen his PCP.  Home Medications    Prior to Admission medications   Medication Sig Start Date End Date Taking? Authorizing Provider  cholecalciferol (VITAMIN D) 1000 units tablet Take 1,000 Units by mouth daily.    [provider]  ferrous gluconate (FERGON) 324 MG tablet Take 1 tablet (324 mg total) by mouth 2 (two) times daily with a meal. 06/30/17   [name], MD  finasteride (PROSCAR) 5 MG tablet Take 5 mg by mouth daily.  12/09/17   [provider]  gabapentin (NEURONTIN) 300 MG capsule Take 1 capsule (300 mg total) by mouth at bedtime. 06/29/17   [name], MD  ibuprofen (ADVIL,MOTRIN) 600 MG tablet Take 600 mg by mouth daily.  05/27/17   [provider]  INVEGA SUSTENNA 234 MG/1.5ML SUSP injection Inject 234 mg into the muscle every 30 (thirty) days.  12/08/17   [provider]  lidocaine (LIDODERM) 5 % Place 1 patch onto the skin daily. Remove & Discard patch within 12 hours or as directed by MD 03/24/18   Maczis, Barth Kirks, PA-C  metFORMIN (GLUCOPHAGE) 500 MG tablet Take 500 mg by mouth daily with breakfast.     [provider]  nicotine (NICODERM CQ - DOSED IN MG/24 HR) 7 mg/24hr patch Place 1 patch (7 mg total) onto the skin daily. 03/24/18   Maczis, Barth Kirks, PA-C  paliperidone (INVEGA) 3 MG 24 hr tablet Take 1 tablet (3 mg total) by mouth at bedtime. 06/29/17   [name], MD  polyethylene glycol (MIRALAX / Floria Raveling) packet Take 17 g by mouth daily. 09/09/18   Ghimire, Henreitta Leber, MD  sertraline (ZOLOFT) 100 MG tablet Take 100 mg by mouth at bedtime.    [provider]  sitaGLIPtin (JANUVIA) 100 MG tablet Take  100 mg by mouth daily with breakfast.    [provider]  tamsulosin (FLOMAX) 0.4 MG CAPS capsule Take 0.4 mg by mouth daily after supper.     [provider]  traZODone (DESYREL) 150 MG tablet Take 0.5 tablets (75 mg total) by mouth at bedtime. 11/29/17   Patrecia Pour, MD   Family History    Family History  Problem Relation Age of Onset  . Heart attack Father   . Anesthesia problems Neg Hx   . Hypotension Neg Hx   . Malignant hyperthermia Neg Hx   . Pseudochol deficiency Neg Hx    Social History    Social History   Socioeconomic History  . Marital status: Single    Spouse name: Not  on file  . Number of children: Not on file  . Years of education: Not on file  . Highest education level: Not on file  Occupational History  . Occupation: disabled  Social Needs  . Financial resource strain: Not on file  . Food insecurity:    Worry: Not on file    Inability: Not on file  . Transportation needs:    Medical: Not on file    Non-medical: Not on file  Tobacco Use  . Smoking status: Current Every Day Smoker    Packs/day: 1.00    Years: 40.00    Pack years: 40.00    Types: Cigarettes  . Smokeless tobacco: Never Used  Substance and Sexual Activity  . Alcohol use: Yes    Comment: occasional use  . Drug use: Yes    Types: Cocaine, Marijuana  . Sexual activity: Yes  Lifestyle  . Physical activity:    Days per week: Not on file    Minutes per session: Not on file  . Stress: Not on file  Relationships  . Social connections:    Talks on phone: Not on file    Gets together: Not on file    Attends religious service: Not on file    Active member of club or organization: Not on file    Attends meetings of clubs or organizations: Not on file    Relationship status: Not on file  . Intimate partner violence:    Fear of current or ex partner: Not on file    Emotionally abused: Not on file    Physically abused: Not on file    Forced sexual activity: Not on file    Other Topics Concern  . Not on file  Social History Narrative  . Not on file    Review of Systems   See HPI All other systems reviewed and are otherwise negative except as noted above.  Physical Exam    Blood pressure 131/89, pulse (!) 109, temperature 99.5 F (37.5 C), temperature source Axillary, resp. rate (!) 22, weight 81.6 kg, SpO2 99 %.  General: Older than stated age, NAD Skin: Warm, dry, intact  Head: Normocephalic, atraumatic, clear, moist mucus membranes. Neck: Negative for carotid bruits. No JVD Lungs: Diminished bilaterally. No wheezes, rales, or rhonchi.  BiPAP ventilation Cardiovascular: RRR with S1 S2. No murmurs, rubs, gallops, or LV heave appreciated. Abdomen: Soft, non-tender, non-distended with normoactive bowel sounds. No obvious abdominal masses. MSK: Strength and tone appear normal for age. 5/5 in all extremities Extremities: Mild 1+ BLE edema. No clubbing or cyanosis. DP/PT pulses 2+ bilaterally Neuro: Somnolent. No focal deficits. No facial asymmetry. MAE spontaneously. Psych: Responds to questions somewhat appropriately with normal affect.    Labs    Troponin [facility] of Care Test) Recent Labs    10/05/18 1446  TROPIPOC 4.22*   No results for input(s): CKTOTAL, CKMB, TROPONINI in the last 72 hours. Lab Results  Component Value Date   WBC 10.0 09/07/2018   HGB 9.0 (L) 09/07/2018   HCT 27.7 (L) 09/07/2018   MCV 94.5 09/07/2018   PLT 308 09/07/2018   No results for input(s): NA, K, CL, CO2, BUN, CREATININE, CALCIUM, PROT, BILITOT, ALKPHOS, ALT, AST, GLUCOSE in the last 168 hours.  Invalid input(s): LABALBU Lab Results  Component Value Date   CHOL 177 06/15/2014   HDL 25 (L) 06/15/2014   LDLCALC 115 (H) 06/15/2014   TRIG 184 06/15/2014   No results found for: [facility]   Radiology Studies  Dg Chest 2 View  Result Date: 09/06/2018 CLINICAL DATA:  Pneumonia EXAM: CHEST - 2 VIEW COMPARISON:  03/23/2018 FINDINGS: Mild cardiomegaly again  noted. Bibasilar opacities are now noted. There may be a trace RIGHT pleural effusion present. No pneumothorax or definite mass. No acute bony abnormalities are present. A RIGHT VP shunt catheter is noted. IMPRESSION: Bibasilar opacities suggestive of pneumonia. Radiographic follow-up to resolution recommended. Electronically Signed   By: Margarette Canada M.D.   On: 09/06/2018 14:41   Ct Abdomen Pelvis W Contrast  Result Date: 09/05/2018 CLINICAL DATA:  Right upper quadrant abdominal pain with a clinical suspicion of cholecystitis. EXAM: CT ABDOMEN AND PELVIS WITH CONTRAST TECHNIQUE: Multidetector CT imaging of the abdomen and pelvis was performed using the standard protocol following bolus administration of intravenous contrast. CONTRAST:  127mL OMNIPAQUE IOHEXOL 300 MG/ML  SOLN COMPARISON:  Right upper quadrant abdomen ultrasound dated 07/02/2016. Abdomen and pelvis CT dated 10/14/2007. FINDINGS: Lower chest: Bibasilar atelectasis and possible pneumonia. Small right pleural effusion and minimal left pleural effusion. Small pericardial effusion with a maximum thickness of 10 mm. Hepatobiliary: No focal liver abnormality is seen. No gallstones, gallbladder wall thickening, or biliary dilatation. Pancreas: Unremarkable. No pancreatic ductal dilatation or surrounding inflammatory changes. Spleen: Normal in size without focal abnormality. Adrenals/Urinary Tract: Interval 1.1 cm rounded fat density mass in the posterior aspect of the upper pole of the left kidney. A more laterally located left renal cyst is unchanged. Normal appearing right kidney, ureters, urinary bladder and adrenal glands. Stomach/Bowel: Unremarkable stomach, small bowel and colon. Surgically absent appendix. Vascular/Lymphatic: Atheromatous arterial calcifications without aneurysm. No enlarged lymph nodes. Reproductive: Prostate is unremarkable. Other: Ventricular peritoneal shunt catheter tip in the left upper pelvis anteriorly. No associated fluid  collection. Musculoskeletal: Pedicle screw and rod fixation at the L2 through S1 levels with interbody fusion at the L4-5 and L5-S1 level. There is a vacuum phenomenon in the disc space and endplate irregularity and sclerosis at the L5-S1 level. IMPRESSION: 1. No acute abnormality in the abdomen or pelvis. 2. Bibasilar atelectasis and possible pneumonia. 3. Small right pleural effusion and minimal left pleural effusion. 4. Small pericardial effusion. 5. 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 Electronically Signed   By: [name] M.D.   On: 09/05/2018 21:12   ECG & Cardiac Imaging    Echocardiogram 09/07/2018: Study Conclusions  - Left ventricle: The cavity size was normal. Systolic function was   normal. The estimated ejection fraction was in the range of 60%   to 65%. Possible hypokinesis of the apicalinferolateral   myocardium. The study is not technically sufficient to allow   evaluation of LV diastolic function. - Mitral valve: There was mild regurgitation. - Tricuspid valve: There was trivial regurgitation.  Assessment & Plan    1.  NSTEMI with no prior history of CAD: -Patient presented after a 1 to 2-day history of shortness of breath with acute onset of midsternal, nonexertional, nonradiating chest pain.  He has no prior CAD history.  Given his symptoms, he called EMS for further evaluation.  Upon EMS arrival, patient was found to be in mild respiratory distress with O2 saturations in the 60s and 70s on room air.  He was placed on nonrebreather with improvement.  He was given 324 ASA and 1 SLN TG with chest pain relief. -EKG was sinus tachycardia with poor R wave progression and no acute ischemic changes, similar to prior tracings -Troponin, found to be markedly elevated at  4.22 -Currently chest pain-free -BNP elevated at 779 with evidence of fluid volume overload on exam -He was recently hospitalized and discharged on 09/08/2018 for rhabdomyolysis and  myopathy to SNF.  During that time, echocardiogram performed which revealed normal LV function with possible hypokinesis of the apicalinferolateral myocardium with mild MR and trivial TR. No further ischemic work-up was completed during this time. -CXR with pending results as well as other lab work to assess renal function and other electrolytes. -Given prior history of myopathy, muscle weakness and poor functional status, he is at risk for DVT.  Will obtain bilateral arterial duplex and check d-dimer.  If positive will obtain CTA -Continue IV heparin secondary to elevated troponin>> continue to cycle -Will obtain echocardiogram, if significant change we will plan for cardiac cath tomorrow.   -Keep NPO after midnight  2.  Respiratory distress: -Patient found to be in respiratory distress upon EMS arrival with O2 saturations in the 60s to 70s.  He was placed on nonrebreather with improvement.  He is somnolent on exam -ABG with pH 7.4, CO2 40, PO2 of 67 -Currently on BiPAP ventilation  3.  Possible pneumonia: -Per chart review, patient was recently admitted and discharged from the hospital on 09/08/2018.  During that time he was treated for possible pneumonia given his respiratory status on presentation as well as tachycardia and lung exam there is concern for progression of his pneumonia. -Awaiting CBC  4.  Recent rhabdomyolysis: -Recently admitted for acute rhabdomyolysis with CK on presentation greater than 50,000 thought to be related to recent cocaine use  5.  DM2: -Managed with home metformin, Januvia -Will place on SSI for glucose control while inpatient status  6.  Cocaine abuse: -UDS positive on previous hospitalization for cocaine 08/30/2018 as well as 03/24/2017, 11/27/2017 -Cessation strongly encouraged  7.  History of schizophrenia: -Stable, continue current regimen per primary team  Signed, Kathyrn Drown NP-C [facility] Pager: [phone] 10/05/2018, @NOW   Patient  examined chart reviewed. Discussed care with PA Patient is really not interactive and given no significant history . Exam with poorly responsive black male bibasilar crackles no murmur soft abdomen bilateral LE edema right worse than left ECG with LVH no acute ischemic changes. UDS pending Recent admission with rhabdomyolysis  High aspiration risk. Would check d dimer and LE duplex to risk stratify for PE given prolonged bed rest and inability To walk during admission for rhabdomyolysis . Not clear that troponin elevation is still related to this. Will order TTE to assess EF. Currently not a candidate for invasive cath evaluation. Has had one dose of lasix in ER and would continue  EF on recent echo 09/17/18 was normal at 60-65%   [employer]

## 2018-10-06 ENCOUNTER — Inpatient Hospital Stay (HOSPITAL_COMMUNITY): Payer: Medicare Other

## 2018-10-06 DIAGNOSIS — J9601 Acute respiratory failure with hypoxia: Secondary | ICD-10-CM

## 2018-10-06 DIAGNOSIS — J189 Pneumonia, unspecified organism: Secondary | ICD-10-CM

## 2018-10-06 DIAGNOSIS — R0602 Shortness of breath: Secondary | ICD-10-CM

## 2018-10-06 DIAGNOSIS — I5030 Unspecified diastolic (congestive) heart failure: Secondary | ICD-10-CM

## 2018-10-06 LAB — BASIC METABOLIC PANEL
ANION GAP: 10 (ref 5–15)
BUN: 15 mg/dL (ref 8–23)
CALCIUM: 8.4 mg/dL — AB (ref 8.9–10.3)
CO2: 22 mmol/L (ref 22–32)
Chloride: 109 mmol/L (ref 98–111)
Creatinine, Ser: 1.3 mg/dL — ABNORMAL HIGH (ref 0.61–1.24)
GFR, EST NON AFRICAN AMERICAN: 58 mL/min — AB (ref >60)
Glucose, Bld: 151 mg/dL — ABNORMAL HIGH (ref 70–99)
POTASSIUM: 3.7 mmol/L (ref 3.5–5.1)
SODIUM: 141 mmol/L (ref 135–145)

## 2018-10-06 LAB — GLUCOSE, CAPILLARY
GLUCOSE-CAPILLARY: 157 mg/dL — AB (ref 70–99)
GLUCOSE-CAPILLARY: 144 mg/dL — AB (ref 70–99)
Glucose-Capillary: 149 mg/dL — ABNORMAL HIGH (ref 70–99)
Glucose-Capillary: 120 mg/dL — ABNORMAL HIGH (ref 70–99)
Glucose-Capillary: 140 mg/dL — ABNORMAL HIGH (ref 70–99)

## 2018-10-06 LAB — CBC
HEMATOCRIT: 28.1 % — AB (ref 39.0–52.0)
Hemoglobin: 8.9 g/dL — ABNORMAL LOW (ref 13.0–17.0)
MCH: 30.5 pg (ref 26.0–34.0)
MCHC: 31.7 g/dL (ref 30.0–36.0)
MCV: 96.2 fL (ref 80.0–100.0)
NRBC: 0 % (ref 0.0–0.2)
PLATELETS: 148 10*3/uL — AB (ref 150–400)
RBC: 2.92 MIL/uL — ABNORMAL LOW (ref 4.22–5.81)
RDW: 12.3 % (ref 11.5–15.5)
WBC: 11.2 10*3/uL — AB (ref 4.0–10.5)

## 2018-10-06 LAB — POCT I-STAT 3, ART BLOOD GAS (G3+)
ACID-BASE EXCESS: 2 mmol/L (ref 0.0–2.0)
Bicarbonate: 26.7 mmol/L (ref 20.0–28.0)
O2 Saturation: 98 %
PH ART: 7.393 (ref 7.350–7.450)
PO2 ART: 115 mmHg — AB (ref 83.0–108.0)
TCO2: 28 mmol/L (ref 22–32)
pCO2 arterial: 44.1 mmHg (ref 32.0–48.0)

## 2018-10-06 LAB — PHOSPHORUS: PHOSPHORUS: 3.3 mg/dL (ref 2.5–4.6)

## 2018-10-06 LAB — PROCALCITONIN: PROCALCITONIN: 1.63 ng/mL

## 2018-10-06 LAB — MAGNESIUM
MAGNESIUM: 1.4 mg/dL — AB (ref 1.7–2.4)
MAGNESIUM: 2.7 mg/dL — AB (ref 1.7–2.4)

## 2018-10-06 LAB — RAPID URINE DRUG SCREEN, HOSP PERFORMED
Amphetamines: NOT DETECTED
Barbiturates: NOT DETECTED
Benzodiazepines: NOT DETECTED
Cocaine: POSITIVE — AB
Opiates: NOT DETECTED
Tetrahydrocannabinol: NOT DETECTED

## 2018-10-06 LAB — TROPONIN I
TROPONIN I: 12.73 ng/mL — AB (ref <0.03)
Troponin I: 13.94 ng/mL (ref <0.03)
Troponin I: 26.21 ng/mL (ref <0.03)

## 2018-10-06 LAB — ECHOCARDIOGRAM COMPLETE
Height: 71 in
WEIGHTICAEL: 3121.71 [oz_av]

## 2018-10-06 LAB — MRSA PCR SCREENING: MRSA by PCR: NEGATIVE

## 2018-10-06 LAB — HEPARIN LEVEL (UNFRACTIONATED)
HEPARIN UNFRACTIONATED: 0.1 [IU]/mL — AB (ref 0.30–0.70)
HEPARIN UNFRACTIONATED: 0.21 [IU]/mL — AB (ref 0.30–0.70)

## 2018-10-06 MED ORDER — HEPARIN BOLUS VIA INFUSION
2000.0000 [IU] | Freq: Once | INTRAVENOUS | Status: AC
  Administered 2018-10-06: 2000 [IU] via INTRAVENOUS
  Filled 2018-10-06: qty 2000

## 2018-10-06 MED ORDER — ASPIRIN 325 MG PO TABS
325.0000 mg | ORAL_TABLET | Freq: Every day | ORAL | Status: DC
  Administered 2018-10-06 – 2018-10-09 (×3): 325 mg via ORAL
  Filled 2018-10-06 (×3): qty 1

## 2018-10-06 MED ORDER — HEPARIN (PORCINE) 25000 UT/250ML-% IV SOLN
1700.0000 [IU]/h | INTRAVENOUS | Status: DC
  Administered 2018-10-06: 1400 [IU]/h via INTRAVENOUS
  Administered 2018-10-07: 1600 [IU]/h via INTRAVENOUS
  Filled 2018-10-06 (×2): qty 250

## 2018-10-06 MED ORDER — SODIUM CHLORIDE 0.9 % IV SOLN
6.0000 g | Freq: Once | INTRAVENOUS | Status: AC
  Administered 2018-10-06: 6 g via INTRAVENOUS
  Filled 2018-10-06: qty 12

## 2018-10-06 MED ORDER — POTASSIUM CHLORIDE 20 MEQ/15ML (10%) PO SOLN
20.0000 meq | ORAL | Status: AC
  Administered 2018-10-06 (×2): 20 meq
  Filled 2018-10-06 (×2): qty 15

## 2018-10-06 NOTE — Progress Notes (Signed)
Received verbal order from CCM/Elink MD to increase PEEP from 5 to 10 r/t low SPO2 values. RT made aware.

## 2018-10-06 NOTE — Progress Notes (Signed)
[place] for Heparin Indication: chest pain/ACS  Allergies  Allergen Reactions  . Penicillins Hives and Other (See Comments)    Has patient had a PCN reaction causing immediate rash, facial/tongue/throat swelling, SOB or lightheadedness with hypotension: No Has patient had a PCN reaction causing severe rash involving mucus membranes or skin necrosis: No Has patient had a PCN reaction that required hospitalization No Has patient had a PCN reaction occurring within the last 10 years: No If all of the above answers are "NO", then may proceed with Cephalosporin use.    Patient Measurements: Height: _0  (180.3 cm) Weight: 180 lb (81.6 kg) IBW/kg (Calculated) : 75.3  Vital Signs: Temp: 100 F (37.8 C) (11/06 2329) Temp Source: Axillary (11/06 2329) BP: 119/72 (11/06 2329) Pulse Rate: 119 (11/06 2329)  Labs: Recent Labs    10/05/18 1707 10/05/18 1715 10/05/18 1743 10/05/18 2147  HGB  --  10.2* 10.2*  --   HCT  --  30.0* 32.5*  --   PLT  --   --  198  --   HEPARINUNFRC  --   --   --  0.16*  CREATININE 1.32* 1.20  --   --   TROPONINI 5.34*  --   --  13.94*    Estimated Creatinine Clearance: 68.9 mL/min (by C-G formula based on SCr of 1.2 mg/dL).   Medical History: Past Medical History:  Diagnosis Date  . Anxiety   . Bipolar disorder ([place])   . Chronic back pain   . Depression   . Diabetes mellitus without complication ([place])   . Diabetic neuropathy ([place])   . DVT (deep venous thrombosis) ([place])   . Enlarged prostate   . Headache   . Hepatitis    Hepatitis C - has been treated with Harvoni (cleared in July, 2017)  . History of colon polyps   . Peripheral vascular disease ([place])   . Pneumonia    hx of-about 6yr ago  . Rhabdomyolysis 08/30/2018  . Schizophrenia ([place]   . Spinal stenosis     Medications:  Scheduled:  . chlorhexidine gluconate (MEDLINE KIT)  15 mL Mouth Rinse BID  . insulin aspart  0-15 Units Subcutaneous  Q4H  . ipratropium-albuterol  3 mL Nebulization Once  . mouth rinse  15 mL Mouth Rinse 10 times per day  . pantoprazole (PROTONIX) IV  40 mg Intravenous QHS    Assessment: [id number] presenting with CP,  Pharmacy consulted for heparin, no AC PTA.  11/7 AM update: initial heparin level low, no issues per RN, troponin increasing, Hgb 10.2  Goal of Therapy:  Heparin level 0.3-0.7 units/ml Monitor platelets by anticoagulation protocol: Yes   Plan:  Inc heparin drip to 1200 units/hr 0900 heparin level Daily CBC, heparin level, s/s bleeding  LNarda Bonds11/05/2018,12:51 AM

## 2018-10-06 NOTE — Progress Notes (Signed)
Progress Note  Patient Name: [name] Date of Encounter: 10/06/2018  Primary Cardiologist: Johnsie Cancel  Subjective   Intubated Opens eyes with loud voice  Inpatient Medications    Scheduled Meds: . aspirin  325 mg Oral Daily  . chlorhexidine gluconate (MEDLINE KIT)  15 mL Mouth Rinse BID  . insulin aspart  0-15 Units Subcutaneous Q4H  . ipratropium-albuterol  3 mL Nebulization Once  . mouth rinse  15 mL Mouth Rinse 10 times per day  . pantoprazole (PROTONIX) IV  40 mg Intravenous QHS  . potassium chloride  20 mEq Per Tube Q4H   Continuous Infusions: . sodium chloride 50 mL/hr at 10/06/18 0700  . ceFEPime (MAXIPIME) IV 2 g (10/05/18 2258)  . fentaNYL infusion INTRAVENOUS 50 mcg/hr (10/06/18 0700)  . heparin 1,200 Units/hr (10/06/18 0700)  . magnesium sulfate LVP 250-500 ml 6 g (10/06/18 0723)  . vancomycin     PRN Meds: fentaNYL, midazolam, midazolam   Vital Signs    Vitals:   10/06/18 0416 10/06/18 0500 10/06/18 0600 10/06/18 0700  BP:  91/62 99/66 (!) 91/59  Pulse:  96 99 95  Resp:  _0 Temp:      TempSrc:      SpO2:  100% 100% 100%  Weight: 88.5 kg     Height:        Intake/Output Summary (Last 24 hours) at 10/06/2018 0824 Last data filed at 10/06/2018 0700 Gross per 24 hour  Intake 880.1 ml  Output 1150 ml  Net -269.9 ml   Filed Weights   10/05/18 1600 10/06/18 0416  Weight: 81.6 kg 88.5 kg    Telemetry    NSR no VT - Personally Reviewed  ECG    NSR with tachycardia had diffuse inferolateral ST depression  - Personally Reviewed  Physical Exam  Intubated Sedated  GEN: No acute distress.   Neck: No JVD Cardiac: RRR, no murmurs, rubs, or gallops.  Respiratory: basilar rales  GI: Soft, nontender, non-distended  MS: No edema; No deformity. Neuro:  Nonfocal  Psych: Seems lethargic on vent Plus one bilateral edema  Labs    Chemistry Recent Labs  Lab 10/05/18 1707 10/05/18 1715 10/06/18 0412  NA 140 142 141  K 3.2* 3.6 3.7    CL 107 106 109  CO2 23  --  22  GLUCOSE 240* 239* 151*  BUN _1 CREATININE 1.32* 1.20 1.30*  CALCIUM 8.8*  --  8.4*  GFRNONAA 57*  --  58*  GFRAA >60  --  >60  ANIONGAP 10  --  10     Hematology Recent Labs  Lab 10/05/18 1715 10/05/18 1743 10/06/18 0412  WBC  --  17.5* 11.2*  RBC  --  3.27* 2.92*  HGB 10.2* 10.2* 8.9*  HCT 30.0* 32.5* 28.1*  MCV  --  99.4 96.2  MCH  --  31.2 30.5  MCHC  --  31.4 31.7  RDW  --  12.0 12.3  PLT  --  198 148*    Cardiac Enzymes Recent Labs  Lab 10/05/18 1707 10/05/18 2147 10/06/18 0412  TROPONINI 5.34* 13.94* 26.21*    Recent Labs  Lab 10/05/18 1446  TROPIPOC 4.22*     BNP Recent Labs  Lab 10/05/18 1436  BNP 779.2*     DDimer  Recent Labs  Lab 10/05/18 1436  DDIMER 1.50*     Radiology    Dg Chest Port 1 View  Result Date: 10/05/2018 CLINICAL DATA:  ET tube advanced, check position. EXAM: PORTABLE CHEST 1 VIEW COMPARISON:  Portable film earlier today. FINDINGS: ET tube now 5.9 cm above carina. Worsening aeration, with increasingly dense BILATERAL lung opacities. IMPRESSION: ET tube now 5.9 cm above carina.  Worsening aeration. Electronically Signed   By: Staci Righter M.D.   On: 10/05/2018 19:27   Dg Chest Port 1 View  Result Date: 10/05/2018 CLINICAL DATA:  Intubation. EXAM: PORTABLE CHEST 1 VIEW COMPARISON:  Portable film earlier today. FINDINGS: ET tube is been placed and lies 6.5 cm above the carina. Orogastric tube has also been placed, and terminates below the limits of the exposure. The heart is enlarged. BILATERAL pulmonary opacities are worse, consistent with pulmonary edema or diffuse pneumonia. IMPRESSION: ETT placement as described, 6.5 cm above carina. Worsening aeration, favored to represent pulmonary edema. Electronically Signed   By: Staci Righter M.D.   On: 10/05/2018 17:41   Dg Chest Portable 1 View  Result Date: 10/05/2018 CLINICAL DATA:  Hypoxia EXAM: PORTABLE CHEST 1 VIEW COMPARISON:   09/06/2018, 03/23/2018 FINDINGS: Moderate interstitial and ground-glass opacity bilaterally with small pleural effusions. Cardiomegaly. No pneumothorax. IMPRESSION: Cardiomegaly. Moderate bilateral interstitial and ground-glass opacity which may reflect pulmonary edema or diffuse pneumonia. There are small bilateral pleural effusions. Electronically Signed   By: Donavan Foil M.D.   On: 10/05/2018 16:44    Cardiac Studies   TTE pending   Patient Profile     62 y.o. male with substance abuse. Recent prolonged admission for rhabomyolysis. Admitted With respiratory distress and altered MS. CHF with ? Aspiration pneumonia requiring intubation Troponin elevated 26 now  Assessment & Plan    SEMI:  Etiology not clear no ST elevation Nurse indicates UDS positive for cocaine again. Continue Diuresis and heparin Limited TTE for LV function which was normal on TTE done 09/07/18 May  Need heart catheterization after extubated and respiratory status improves  Pulm:  Requiring higher FI02 and PEEP this am  Morning CXR pending Diuresis for CHF component On antibiotics for ? Aspiration per CCM       For questions or updates, please contact [facility] Please consult [email or web address] for contact info under        Signed, Jenkins Rouge, MD  10/06/2018, 8:24 AM

## 2018-10-06 NOTE — Progress Notes (Signed)
[facility] ADULT ICU REPLACEMENT PROTOCOL FOR AM LAB REPLACEMENT ONLY  The patient does apply for the [facility] Adult ICU Electrolyte Replacment Protocol based on the criteria listed below:   1. Is GFR >/= 40 ml/min? Yes.    Patient's GFR today is 60 2. Is urine output >/= 0.5 ml/kg/hr for the last 6 hours? Yes.   Patient's UOP is 1.5 ml/kg/hr 3. Is BUN < 60 mg/dL? Yes.    Patient's BUN today is 15 4. Abnormal electrolyte(s): K-3.7 Mag-1.4 5. Ordered repletion with: per protocol 6. If a panic level lab has been reported, has the CCM MD in charge been notified? Yes.  .   Physician:  Dr. Gray Bernhardt, Philis Nettle 10/06/2018 6:41 AM

## 2018-10-06 NOTE — Progress Notes (Signed)
Bilateral lower extremity venous duplex has been completed. Negative for DVT.  10/06/18 10:33 AM [name] RVT

## 2018-10-06 NOTE — Progress Notes (Signed)
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  No obvious flail or Ruptured papillary muscle on TTE Will plan on TEE at bedside first thing in am. Will then discuss with Dr Burt Knack possible right and left cath especially if mitral valve apparatus disrupted May need surgical consult or mitral clip depending on coronary anatomy  [name]

## 2018-10-06 NOTE — Progress Notes (Signed)
NAME:  [name], MRN:  [id number], DOB:  [birth year], LOS: 1 ADMISSION DATE:  10/05/2018, CONSULTATION DATE:  10/05/18 REFERRING MD:  Darl Householder CHIEF COMPLAINT:  SOB   Brief History   [name] is a 62 y.o. male who was admitted 11/6 with NSTEMI and acute hypoxic respiratory failure requiring intubation.  Past Medical History  Spinal stenosis, shizhophrenia, bipolar disorder, depression, anxiety, polysubstance abuse, rhabdo, PVD, DVT, HCV s/p treatment with Harvoni, diabetic neuropathy, DM.  Significant Hospital Events   11/6 > admit.  Consults: date of consult/date signed off & final recs:  Cardiology 11/6 >   Procedures (surgical and bedside):  ETT 11/6 >   Significant Diagnostic Tests:  CXR 11/6 > cardiomegaly, small b/l effusions, b/l GGO's. Echo 11/7 >  LE duplex 11/7 > neg  Micro Data:  Blood 11/6 >  Sputum 11/6 >  Urine 11/6 >  MRSA PCR 11/6 >> neg  Antimicrobials:  Vanc 11/6 > 11/7 Cefepime 11/6 >   Subjective:  Sedated on fentanyl 50 mcg/hr   Objective:  Blood pressure 98/64, pulse 93, temperature 99.5 F (37.5 C), temperature source Oral, resp. rate 18, height 5\' 11"  (1.803 m), weight 88.5 kg, SpO2 100 %.    Vent Mode: PRVC FiO2 (%):  [60 %-100 %] 60 % Set Rate:  [18 bmp] 18 bmp Vt Set:  [600 mL] 600 mL PEEP:  [5 cmH20-10 cmH20] 10 cmH20 Pressure Support:  [12 cmH20] 12 cmH20 Plateau Pressure:  [24 cmH20-27 cmH20] 24 cmH20   Intake/Output Summary (Last 24 hours) at 10/06/2018 1054 Last data filed at 10/06/2018 0800 Gross per 24 hour  Intake 943.22 ml  Output 1200 ml  Net -256.78 ml   Filed Weights   10/05/18 1600 10/06/18 0416  Weight: 81.6 kg 88.5 kg    Examination: General:  Adult male sedated on MV in NAD HEENT: MM pink/moist, pupils 3/reactive, 7.5 ETT at 23 cm, OGT, no JVD Neuro: sedated, will open eyes to commands, MAE CV: RR, SR 86, no murmur, +2 pulses PULM: even/non-labored on full MV, lungs bilaterally clear  GI: soft,  +bs Extremities: warm/dry, 1-2+ BLE edema Skin: no rashes or lesions  Assessment & Plan:   NSTEMI. - tele monitoring - borderline blood pressures, high risk for development of cardiogenic shock, but also could be related to sedation, diuresis, +/- sepsis component. - UDS + cocaine, avoid BB - prior limited TTE 10/9 w/ normal LV function - appreciate Cardiology input, may consider cath when respiratory status improves/ extubated or if significant change - will keep NPO for now - Continue heparin gtt per cards and daily ASA - Trend EKG/ troponin's- 13.94 -> 26.21 -> - EKG as needed - TTE in process   Acute hypoxic respiratory failure  - CXR 11/7 reviewed, stable ETT/ gastric, much improved bilateral opacities - edema +/- infiltrate  - Continue full vent support PRVC 8 ml/kg, rate 18 - wean FiO2/ peep as able goal 94-99%  - hold SBT for today given ongoing NSTEMI - ongoing bronchial hygiene - trend CXR/ ABG as needed  - hold further diuresis given borderline SBP   Possible HCAP  - Continue empiric cefepime for now - MRSA PCR neg, will d/c vanc  - Follow sputum cx  - PCT 0.73 -> 1.63  R/o DVT  - LE duplex negative   DM. - SSI moderate - Holding preadmission invega, metformin, sitagliptin.  Mild AKI  Hypocalcemia  Hypo magnesium  - diuresed ~1.2 L after lasix with  much improvement in CXR  - s/p calcium and mag replete  - trend BMP/ UOP/ daily wt  Hx substance abuse (UDS on prior admits positive for cocaine), schizophrenia, bipolar, anxiety, depression. - Substance abuse counseling when able - UDS positive for cocaine  - Holding  preadmission gabapentin, sertraline, trazodone.  Disposition / Summary of Today's Plan 10/06/18   Continue full support today, trending troponin/ EKG, TTE pending, ongoing heparin gtt and monitoring hemodynamics    Diet: NPO. Pain/Anxiety/Delirium protocol (if indicated): Fentanyl gtt / Midazolam PRN.  RASS goal 0 to -1. VAP protocol (if  indicated): In place. DVT prophylaxis: Heparin gtt GI prophylaxis: Pantoprazole Hyperglycemia protocol: SSI Mobility: Bedrest. Code Status: Full. Family Communication: Family stepped out for TTE but will update.    Labs   CBC: Recent Labs  Lab 10/05/18 1715 10/05/18 1743 10/06/18 0412  WBC  --  17.5* 11.2*  NEUTROABS  --  12.3*  --   HGB 10.2* 10.2* 8.9*  HCT 30.0* 32.5* 28.1*  MCV  --  99.4 96.2  PLT  --  198 466*   Basic Metabolic Panel: Recent Labs  Lab 10/05/18 1707 10/05/18 1715 10/06/18 0412  NA 140 142 141  K 3.2* 3.6 3.7  CL 107 106 109  CO2 23  --  22  GLUCOSE 240* 239* 151*  BUN 13 13 15   CREATININE 1.32* 1.20 1.30*  CALCIUM 8.8*  --  8.4*  MG  --   --  1.4*  PHOS  --   --  3.3   GFR: Estimated Creatinine Clearance: 63.6 mL/min (A) (by C-G formula based on SCr of 1.3 mg/dL (H)). Recent Labs  Lab 10/05/18 1743 10/05/18 2147 10/06/18 0412  PROCALCITON  --  0.73 1.63  WBC 17.5*  --  11.2*   Liver Function Tests: No results for input(s): AST, ALT, ALKPHOS, BILITOT, PROT, ALBUMIN in the last 168 hours. No results for input(s): LIPASE, AMYLASE in the last 168 hours. No results for input(s): AMMONIA in the last 168 hours. ABG    Component Value Date/Time   PHART 7.393 10/06/2018 0414   PCO2ART 44.1 10/06/2018 0414   PO2ART 115.0 (H) 10/06/2018 0414   HCO3 26.7 10/06/2018 0414   TCO2 28 10/06/2018 0414   ACIDBASEDEF 1.0 10/05/2018 1951   O2SAT 98.0 10/06/2018 0414    Coagulation Profile: No results for input(s): INR, PROTIME in the last 168 hours. Cardiac Enzymes: Recent Labs  Lab 10/05/18 1707 10/05/18 2147 10/06/18 0412  TROPONINI 5.34* 13.94* 26.21*   HbA1C: Hemoglobin A1C  Date/Time Value Ref Range Status  06/15/2014 05:42 AM 6.5 (H) 4.2 - 6.3 % Final    Comment:    The American Diabetes Association recommends that a primary goal of therapy should be <7% and that physicians should reevaluate the treatment regimen in patients with  HbA1c values consistently >8%.    Hgb A1c MFr Bld  Date/Time Value Ref Range Status  11/27/2017 02:03 PM 5.1 4.8 - 5.6 % Final    Comment:    (NOTE) Pre diabetes:          5.7%-6.4% Diabetes:              >6.4% Glycemic control for   <7.0% adults with diabetes   06/26/2017 04:39 AM 5.5 4.8 - 5.6 % Final    Comment:    (NOTE)         Pre-diabetes: 5.7 - 6.4         Diabetes: >6.4  Glycemic control for adults with diabetes: <7.0    CBG: Recent Labs  Lab 10/05/18 2331 10/06/18 0338 10/06/18 0829  GLUCAP 144* 157* 144*     Critical care time: 40 mins    Kennieth Rad, AGACNP-BC [facility] Pgr: [phone] or if no answer [phone] 10/06/2018, 10:54 AM

## 2018-10-06 NOTE — Progress Notes (Addendum)
62 year old man was admitted 11/6 with chest pain shortness of breath and severe hypoxia failing BiPAP requiring intubation in the ED Recent admission for 10 days in October for rhabdomyolysis and pericardial effusion UDS positive for cocaine on both occasions  Chest x-ray personally reviewed which shows dramatic improvement in bilateral infiltrates.  On exam-sedated on fentanyl drip, agitated on wake-up assessment, decreased breath sounds bilateral, no crackles or wheezing, S1-S2 normal, no edema, poor hygiene  EKG is personally reviewed which shows rate related ST depressions in inferolateral leads  Labs show troponin still rising to 26, procalcitonin at 1.6,Mild leukocytosis and normal electrolytes with increase in creatinine to 1.3  Impression/plan Acute hypoxic respiratory failure with bilateral infiltrates-dramatic improvement seems to suggest that most of this is acute pulmonary edema -Drop PEEP to 5 -Consider spontaneous breathing trials with goal extubation soon  Acute diastolic heart failure/cocaine use/non-STEMI -Await repeat echo -Continue to cycle troponins to capture peak -May need cardiac cath eventually when stable  Possible H CAP -continue empiric cefepime, can discontinue vancomycin, decide duration based on procalcitonin and resolution of infiltrates  Acute encephalopathy/agitation-fentanyl drip for goal RA SS -1 Resume his home drips including antipsychotics and trazodone  Family updated at bedside.  Addendum - echo reviewed & d/w cards - concern for ischemic MR causing APE , may need TEE  The patient is critically ill with multiple organ systems failure and requires high complexity decision making for assessment and support, frequent evaluation and titration of therapies, application of advanced monitoring technologies and extensive interpretation of multiple databases. Critical Care Time devoted to patient care services described in this note independent of  APP/resident  time is 35 minutes.    Leanna Sato Elsworth Soho MD [phone]

## 2018-10-06 NOTE — Progress Notes (Signed)
[place] for Heparin Indication: chest pain/ACS  Allergies  Allergen Reactions  . Penicillins Hives and Other (See Comments)    Has patient had a PCN reaction causing immediate rash, facial/tongue/throat swelling, SOB or lightheadedness with hypotension: No Has patient had a PCN reaction causing severe rash involving mucus membranes or skin necrosis: No Has patient had a PCN reaction that required hospitalization No Has patient had a PCN reaction occurring within the last 10 years: No If all of the above answers are "NO", then may proceed with Cephalosporin use.    Patient Measurements: Height: 5' 11"  (180.3 cm) Weight: 195 lb 1.7 oz (88.5 kg) IBW/kg (Calculated) : 75.3  Vital Signs: Temp: 99.5 F (37.5 C) (11/07 0828) Temp Source: Oral (11/07 0828) BP: 98/64 (11/07 0828) Pulse Rate: 93 (11/07 0828)  Labs: Recent Labs    10/05/18 1707  10/05/18 1715 10/05/18 1743 10/05/18 2147 10/06/18 0412  HGB  --    < > 10.2* 10.2*  --  8.9*  HCT  --   --  30.0* 32.5*  --  28.1*  PLT  --   --   --  198  --  148*  HEPARINUNFRC  --   --   --   --  0.16*  --   CREATININE 1.32*  --  1.20  --   --  1.30*  TROPONINI 5.34*  --   --   --  13.94* 26.21*   < > = values in this interval not displayed.    Estimated Creatinine Clearance: 63.6 mL/min (A) (by C-G formula based on SCr of 1.3 mg/dL (H)).   Medical History: Past Medical History:  Diagnosis Date  . Anxiety   . Bipolar disorder ([place])   . Chronic back pain   . Depression   . Diabetes mellitus without complication ([place])   . Diabetic neuropathy ([place])   . DVT (deep venous thrombosis) ([place])   . Enlarged prostate   . Headache   . Hepatitis    Hepatitis C - has been treated with Harvoni (cleared in July, 2017)  . History of colon polyps   . Peripheral vascular disease ([place])   . Pneumonia    hx of-about 82yr ago  . Rhabdomyolysis 08/30/2018  . Schizophrenia ([place]   . Spinal stenosis      Medications:  Scheduled:  . aspirin  325 mg Oral Daily  . chlorhexidine gluconate (MEDLINE KIT)  15 mL Mouth Rinse BID  . insulin aspart  0-15 Units Subcutaneous Q4H  . ipratropium-albuterol  3 mL Nebulization Once  . mouth rinse  15 mL Mouth Rinse 10 times per day  . pantoprazole (PROTONIX) IV  40 mg Intravenous QHS  . potassium chloride  20 mEq Per Tube Q4H    Assessment: 89YOM presenting with CP/STEMI,  Pharmacy consulted for heparin, no AC PTA. Doppler negative for DVT. Plans noted for cath after extubated and improved respiratory status. -heparin level = 0.10 on 1200 units/hr -hg= 8.9, ply= 148  Goal of Therapy:  Heparin level 0.3-0.7 units/ml Monitor platelets by anticoagulation protocol: Yes   Plan:  -Heparin bolus 2000 units then increase to 1400 units/hr -Heparin level in 6 hours and daily wth CBC daily  AHildred Laser PharmD Clinical Pharmacist **Pharmacist phone directory can now be found on [email or web address] ([email or web address]).  Listed under [place]

## 2018-10-06 NOTE — Progress Notes (Signed)
CRITICAL VALUE ALERT  Critical Value:  Troponin @ 13.94  Date & Time Notied:  10/06/18 @ 0130  Provider Notified: Warren Lacy MD  Orders Received/Actions taken: No new orders placed.

## 2018-10-06 NOTE — Progress Notes (Signed)
[place] for Heparin Indication: chest pain/ACS  Allergies  Allergen Reactions  . Penicillins Hives and Other (See Comments)    Has patient had a PCN reaction causing immediate rash, facial/tongue/throat swelling, SOB or lightheadedness with hypotension: No Has patient had a PCN reaction causing severe rash involving mucus membranes or skin necrosis: No Has patient had a PCN reaction that required hospitalization No Has patient had a PCN reaction occurring within the last 10 years: No If all of the above answers are "NO", then may proceed with Cephalosporin use.    Patient Measurements: Height: 5' 11"  (180.3 cm) Weight: 195 lb 1.7 oz (88.5 kg) IBW/kg (Calculated) : 75.3  Vital Signs: Temp: 98.5 F (36.9 C) (11/07 1633) Temp Source: Oral (11/07 1633) BP: 95/61 (11/07 1645) Pulse Rate: 86 (11/07 1645)  Labs: Recent Labs    10/05/18 1707  10/05/18 1715 10/05/18 1743 10/05/18 2147 10/06/18 0412 10/06/18 0848 10/06/18 1146 10/06/18 1659  HGB  --    < > 10.2* 10.2*  --  8.9*  --   --   --   HCT  --   --  30.0* 32.5*  --  28.1*  --   --   --   PLT  --   --   --  198  --  148*  --   --   --   HEPARINUNFRC  --   --   --   --  0.16*  --  0.10*  --  0.21*  CREATININE 1.32*  --  1.20  --   --  1.30*  --   --   --   TROPONINI 5.34*  --   --   --  13.94* 26.21*  --  12.73*  --    < > = values in this interval not displayed.    Estimated Creatinine Clearance: 63.6 mL/min (A) (by C-G formula based on SCr of 1.3 mg/dL (H)).   Medical History: Past Medical History:  Diagnosis Date  . Anxiety   . Bipolar disorder ([place])   . Chronic back pain   . Depression   . Diabetes mellitus without complication ([place])   . Diabetic neuropathy ([place])   . DVT (deep venous thrombosis) ([place])   . Enlarged prostate   . Headache   . Hepatitis    Hepatitis C - has been treated with Harvoni (cleared in July, 2017)  . History of colon polyps   . Peripheral  vascular disease ([place])   . Pneumonia    hx of-about 35yr ago  . Rhabdomyolysis 08/30/2018  . Schizophrenia ([place]   . Spinal stenosis     Medications:  Scheduled:  . aspirin  325 mg Oral Daily  . chlorhexidine gluconate (MEDLINE KIT)  15 mL Mouth Rinse BID  . insulin aspart  0-15 Units Subcutaneous Q4H  . ipratropium-albuterol  3 mL Nebulization Once  . mouth rinse  15 mL Mouth Rinse 10 times per day  . pantoprazole (PROTONIX) IV  40 mg Intravenous QHS    Assessment: 89YOM presenting with CP/STEMI,  Pharmacy consulted for heparin, no AC PTA. Doppler negative for DVT. Plans noted for cath after extubated and improved respiratory status.  Heparin level remains below goal at 0.21.  Goal of Therapy:  Heparin level 0.3-0.7 units/ml Monitor platelets by anticoagulation protocol: Yes   Plan:  -Increase heparin infusion to 1600 units/hr -Recheck heparin level with morning labs  MArrie Senate PharmD, BCPS Clinical Pharmacist [phone] check  AMION for all [facility] Pharm[facility] numbers 10/06/2018

## 2018-10-06 NOTE — Progress Notes (Signed)
CRITICAL VALUE ALERT  Critical Value:  Troponin @ 26.21  Date & Time Notied:  10/06/18 @ 0630  Provider Notified: Elink/CCM  Orders Received/Actions taken: STAT 12 lead ECG (See pt. Chart)

## 2018-10-06 NOTE — Progress Notes (Signed)
2D Echocardiogram has been performed.  [name] 10/06/2018, 11:29 AM

## 2018-10-07 ENCOUNTER — Inpatient Hospital Stay (HOSPITAL_COMMUNITY): Payer: Medicare Other

## 2018-10-07 ENCOUNTER — Encounter (HOSPITAL_COMMUNITY): Payer: Self-pay | Admitting: Cardiovascular Disease

## 2018-10-07 ENCOUNTER — Encounter (HOSPITAL_COMMUNITY)
Admission: EM | Disposition: A | Discharge status: Home Health | Payer: Self-pay | Source: Home / Self Care | Attending: Pulmonary Disease

## 2018-10-07 DIAGNOSIS — I340 Nonrheumatic mitral (valve) insufficiency: Secondary | ICD-10-CM

## 2018-10-07 DIAGNOSIS — E119 Type 2 diabetes mellitus without complications: Secondary | ICD-10-CM

## 2018-10-07 DIAGNOSIS — J810 Acute pulmonary edema: Secondary | ICD-10-CM

## 2018-10-07 HISTORY — PX: ULTRASOUND GUIDANCE FOR VASCULAR ACCESS: SHX6516

## 2018-10-07 HISTORY — PX: RIGHT/LEFT HEART CATH AND CORONARY ANGIOGRAPHY: CATH118266

## 2018-10-07 LAB — POCT I-STAT 3, VENOUS BLOOD GAS (G3P V)
ACID-BASE DEFICIT: 1 mmol/L (ref 0.0–2.0)
Bicarbonate: 24.4 mmol/L (ref 20.0–28.0)
O2 Saturation: 52 %
TCO2: 26 mmol/L (ref 22–32)
pCO2, Ven: 44.2 mmHg (ref 44.0–60.0)
pH, Ven: 7.351 (ref 7.250–7.430)
pO2, Ven: 29 mmHg — CL (ref 32.0–45.0)

## 2018-10-07 LAB — HEPATIC FUNCTION PANEL
ALT: 17 U/L (ref 0–44)
AST: 34 U/L (ref 15–41)
Albumin: 2.6 g/dL — ABNORMAL LOW (ref 3.5–5.0)
Alkaline Phosphatase: 63 U/L (ref 38–126)
BILIRUBIN DIRECT: 0.2 mg/dL (ref 0.0–0.2)
Indirect Bilirubin: 0.6 mg/dL (ref 0.3–0.9)
Total Bilirubin: 0.8 mg/dL (ref 0.3–1.2)
Total Protein: 6 g/dL — ABNORMAL LOW (ref 6.5–8.1)

## 2018-10-07 LAB — RENAL FUNCTION PANEL
ALBUMIN: 2.6 g/dL — AB (ref 3.5–5.0)
ANION GAP: 8 (ref 5–15)
BUN: 21 mg/dL (ref 8–23)
CHLORIDE: 109 mmol/L (ref 98–111)
CO2: 23 mmol/L (ref 22–32)
Calcium: 8.3 mg/dL — ABNORMAL LOW (ref 8.9–10.3)
Creatinine, Ser: 1.29 mg/dL — ABNORMAL HIGH (ref 0.61–1.24)
GFR calc Af Amer: >60 mL/min (ref >60)
GFR calc non Af Amer: 58 mL/min — ABNORMAL LOW (ref >60)
GLUCOSE: 138 mg/dL — AB (ref 70–99)
PHOSPHORUS: 2.5 mg/dL (ref 2.5–4.6)
POTASSIUM: 3.8 mmol/L (ref 3.5–5.1)
Sodium: 140 mmol/L (ref 135–145)

## 2018-10-07 LAB — URINE CULTURE: Culture: NO GROWTH

## 2018-10-07 LAB — CBC
HCT: 24.9 % — ABNORMAL LOW (ref 39.0–52.0)
Hemoglobin: 7.7 g/dL — ABNORMAL LOW (ref 13.0–17.0)
MCH: 30.2 pg (ref 26.0–34.0)
MCHC: 30.9 g/dL (ref 30.0–36.0)
MCV: 97.6 fL (ref 80.0–100.0)
PLATELETS: 126 10*3/uL — AB (ref 150–400)
RBC: 2.55 MIL/uL — AB (ref 4.22–5.81)
RDW: 12.4 % (ref 11.5–15.5)
WBC: 8.9 10*3/uL (ref 4.0–10.5)
nRBC: 0 % (ref 0.0–0.2)

## 2018-10-07 LAB — HEPARIN LEVEL (UNFRACTIONATED): Heparin Unfractionated: 0.29 IU/mL — ABNORMAL LOW (ref 0.30–0.70)

## 2018-10-07 LAB — GLUCOSE, CAPILLARY
GLUCOSE-CAPILLARY: 128 mg/dL — AB (ref 70–99)
GLUCOSE-CAPILLARY: 110 mg/dL — AB (ref 70–99)
GLUCOSE-CAPILLARY: 110 mg/dL — AB (ref 70–99)
GLUCOSE-CAPILLARY: 114 mg/dL — AB (ref 70–99)
Glucose-Capillary: 150 mg/dL — ABNORMAL HIGH (ref 70–99)

## 2018-10-07 LAB — POCT I-STAT 3, ART BLOOD GAS (G3+)
ACID-BASE DEFICIT: 2 mmol/L (ref 0.0–2.0)
Bicarbonate: 22.4 mmol/L (ref 20.0–28.0)
O2 Saturation: 90 %
PH ART: 7.388 (ref 7.350–7.450)
PO2 ART: 60 mmHg — AB (ref 83.0–108.0)
TCO2: 23 mmol/L (ref 22–32)
pCO2 arterial: 37.1 mmHg (ref 32.0–48.0)

## 2018-10-07 LAB — PROCALCITONIN: PROCALCITONIN: 1.29 ng/mL

## 2018-10-07 LAB — MAGNESIUM: Magnesium: 2.5 mg/dL — ABNORMAL HIGH (ref 1.7–2.4)

## 2018-10-07 SURGERY — RIGHT/LEFT HEART CATH AND CORONARY ANGIOGRAPHY
Anesthesia: LOCAL

## 2018-10-07 MED ORDER — SODIUM CHLORIDE 0.9% FLUSH: 3.0000 mL | INTRAVENOUS | Status: DC | PRN

## 2018-10-07 MED ORDER — BENZTROPINE MESYLATE 1 MG PO TABS
1.0000 mg | ORAL_TABLET | Freq: Two times a day (BID) | ORAL | Status: DC
  Administered 2018-10-07 – 2018-10-08 (×3): 1 mg via ORAL
  Filled 2018-10-07 (×4): qty 1

## 2018-10-07 MED ORDER — HEPARIN (PORCINE) IN NACL 1000-0.9 UT/500ML-% IV SOLN
INTRAVENOUS | Status: AC
  Filled 2018-10-07: qty 1000

## 2018-10-07 MED ORDER — IOHEXOL 350 MG/ML SOLN
INTRAVENOUS | Status: DC | PRN
  Administered 2018-10-07: 50 mL via INTRA_ARTERIAL

## 2018-10-07 MED ORDER — ASPIRIN 81 MG PO CHEW
CHEWABLE_TABLET | ORAL | Status: AC
  Administered 2018-10-07: 11:00:00
  Filled 2018-10-07: qty 1

## 2018-10-07 MED ORDER — MIDAZOLAM HCL 2 MG/2ML IJ SOLN
INTRAMUSCULAR | Status: DC | PRN
  Administered 2018-10-07: 2 mg via INTRAVENOUS

## 2018-10-07 MED ORDER — SODIUM CHLORIDE 0.9 % IV SOLN: INTRAVENOUS | Status: DC

## 2018-10-07 MED ORDER — VITAL 1.5 CAL PO LIQD
1000.0000 mL | ORAL | Status: DC
  Administered 2018-10-08 – 2018-10-10 (×4): 1000 mL
  Filled 2018-10-07 (×11): qty 1000

## 2018-10-07 MED ORDER — SODIUM CHLORIDE 0.9% FLUSH: 3.0000 mL | Freq: Two times a day (BID) | INTRAVENOUS | Status: DC

## 2018-10-07 MED ORDER — PRO-STAT SUGAR FREE PO LIQD
30.0000 mL | Freq: Two times a day (BID) | ORAL | Status: DC
  Administered 2018-10-07 – 2018-10-12 (×10): 30 mL
  Filled 2018-10-07 (×10): qty 30

## 2018-10-07 MED ORDER — LIDOCAINE HCL (PF) 1 % IJ SOLN
INTRAMUSCULAR | Status: DC | PRN
  Administered 2018-10-07: 10 mL

## 2018-10-07 MED ORDER — HEPARIN (PORCINE) IN NACL 1000-0.9 UT/500ML-% IV SOLN
INTRAVENOUS | Status: DC | PRN
  Administered 2018-10-07: 500 mL

## 2018-10-07 MED ORDER — SODIUM CHLORIDE 0.9 % IV SOLN
INTRAVENOUS | Status: DC
  Administered 2018-10-07: 04:00:00 via INTRAVENOUS

## 2018-10-07 MED ORDER — SODIUM CHLORIDE 0.9% FLUSH
3.0000 mL | Freq: Two times a day (BID) | INTRAVENOUS | Status: DC
  Administered 2018-10-07 – 2018-10-12 (×6): 3 mL via INTRAVENOUS

## 2018-10-07 MED ORDER — MIDAZOLAM HCL 2 MG/2ML IJ SOLN
INTRAMUSCULAR | Status: AC
  Filled 2018-10-07: qty 2

## 2018-10-07 MED ORDER — SODIUM CHLORIDE 0.9 % IV SOLN
250.0000 mL | INTRAVENOUS | Status: DC | PRN
  Administered 2018-10-07: 250 mL via INTRAVENOUS

## 2018-10-07 MED ORDER — FUROSEMIDE 10 MG/ML IJ SOLN
40.0000 mg | Freq: Two times a day (BID) | INTRAMUSCULAR | Status: DC
  Administered 2018-10-07 – 2018-10-08 (×2): 40 mg via INTRAVENOUS
  Filled 2018-10-07 (×2): qty 4

## 2018-10-07 MED ORDER — VITAL HIGH PROTEIN PO LIQD
1000.0000 mL | ORAL | Status: DC
  Administered 2018-10-07: 1000 mL

## 2018-10-07 MED ORDER — SODIUM CHLORIDE 0.9 % IV SOLN: 250.0000 mL | INTRAVENOUS | Status: DC | PRN

## 2018-10-07 MED ORDER — ASPIRIN 81 MG PO CHEW
81.0000 mg | CHEWABLE_TABLET | ORAL | Status: AC
  Administered 2018-10-07: 81 mg via ORAL

## 2018-10-07 MED ORDER — HEPARIN SODIUM (PORCINE) 5000 UNIT/ML IJ SOLN
5000.0000 [IU] | Freq: Three times a day (TID) | INTRAMUSCULAR | Status: DC
  Administered 2018-10-07 – 2018-10-16 (×25): 5000 [IU] via SUBCUTANEOUS
  Filled 2018-10-07 (×25): qty 1

## 2018-10-07 MED ORDER — LIDOCAINE HCL (PF) 1 % IJ SOLN
INTRAMUSCULAR | Status: AC
  Filled 2018-10-07: qty 30

## 2018-10-07 SURGICAL SUPPLY — 15 items
CATH INFINITI 5FR MULTPACK ANG (CATHETERS) ×1 IMPLANT
CATH SWAN GANZ 7F STRAIGHT (CATHETERS) ×1 IMPLANT
CLOSURE MYNX CONTROL 5F (Vascular Products) ×1 IMPLANT
CLOSURE MYNX CONTROL 6F/7F (Vascular Products) ×1 IMPLANT
GLIDESHEATH SLEND SS 6F .021 (SHEATH) ×1 IMPLANT
GUIDEWIRE INQWIRE 1.5J.035X260 (WIRE) IMPLANT
INQWIRE 1.5J .035X260CM (WIRE) ×3
KIT HEART LEFT (KITS) ×3 IMPLANT
PACK CARDIAC CATHETERIZATION (CUSTOM PROCEDURE TRAY) ×3 IMPLANT
SHEATH PINNACLE 5F 10CM (SHEATH) ×1 IMPLANT
SHEATH PINNACLE 7F 10CM (SHEATH) ×1 IMPLANT
SHEATH PROBE COVER 6X72 (BAG) ×1 IMPLANT
TRANSDUCER W/STOPCOCK (MISCELLANEOUS) ×3 IMPLANT
TUBING CIL FLEX 10 FLL-RA (TUBING) ×3 IMPLANT
WIRE EMERALD 3MM-J .035X150CM (WIRE) ×1 IMPLANT

## 2018-10-07 NOTE — Progress Notes (Addendum)
Patient bath attempted. Patient became very agitated/combative and tried to climb out of bed. O2 sats dropped into the 80's. RT at bedside, oxygen increased to 100 percent. Patient sedation increased once oxygen saturation improved. Will continue to monitor. No bath given at this time, will attempt once patient is calm and cooperative.

## 2018-10-07 NOTE — H&P (View-Only) (Signed)
TEE: During this procedure the patient is administered a total of Versed 5 mg and Fentanyl drip /  mg to achieve and maintain moderate conscious sedation.  The patient's heart rate, blood pressure, and oxygen saturation are monitored continuously during the procedure. The period of conscious sedation is 30 minutes, of which I was present face-to-face 100% of this time.  Mild LV dilatation EF 35-40% inferior basal hypokinesis Severe central MR but no ruptured chords, no flail leaflet and no ruptured papillary muscle Normal RV No LAA thrombus No effusion Mild TR Normal AV  Patient will have right and left heart cath today with Dr Burt Knack CVTS has been made aware of patient but currently no acute indication for surgery Will depend also on what coronary artery looks like   Jenkins Rouge

## 2018-10-07 NOTE — Progress Notes (Signed)
Spoke with [name] (pt's ACT team member).  As described by [name], ACT team is a community resource for pt to help him with his schizophrenia.  A team member and/or nurse goes to his home approx 2 times per week.  [name] spoke with the nurse that sees pt to confirm his Invega/Cogentin doses.  Per nurse, pt receives Invega 324mg  every month (last does given 10/21 IM by nurse) and Cogentin 1mg  BID.   [name] also states pt smokes 2-3 cigarettes per day.  She is unsure of amount of cocaine pt smokes, and how much alcohol pt drinks.  She believes ETOH is not consumed as much as cocaine.  Cocaine use for approx 20 years.  Pt lives alone with his granddaughter [name] his closest family contact with whom checks on him regularly.

## 2018-10-07 NOTE — Progress Notes (Signed)
Initial Nutrition Assessment  DOCUMENTATION CODES:   Not applicable  INTERVENTION:   Tube feeding via OG tube: - Vital 1.5 @ 50 ml/hr (1200 ml/day) - Pro-stat 30 ml BID  Tube feeding regimen provides 2007 kcal, 111 grams of protein, and 912 ml of H2O (100% of needs).  NUTRITION DIAGNOSIS:   Inadequate oral intake related to inability to eat as evidenced by NPO status.  GOAL:   Patient will meet greater than or equal to 90% of their needs  MONITOR:   Vent status, TF tolerance, I & O's, Weight trends, Labs  REASON FOR ASSESSMENT:   Consult Enteral/tube feeding initiation and management  ASSESSMENT:   62 year old male who presented to the ED on 11/6 with SOB. PMH significant for schizophrenia, PVD, spinal stenosis, type 2 diabetes mellitus, and substance abuse. Pt was intubated in the ED and was found to have pulmonary edema secondary to NSTEMI. TEE shows new severe MR and systolic dysfunction.  Discussed pt with RN. No family present at time of visit so unable to obtain diet or weight history.  Vital High Protein infusing via OG tube at 20 ml/hr at time of visit per ICU tube feeding protocol.  Per weight history in chart, weight has fluctuated between 82.9-85.7 kg over the past 1 year.  Patient is currently intubated on ventilator support. OG tube in stomach. MV: 11.1 L/min Temp (24hrs), Avg:98.2 F (36.8 C), Min:97.9 F (36.6 C), Max:98.4 F (36.9 C)  Fentanyl: 12.5 ml/hr IVF: 10 ml/hr  Medications reviewed and include: Lasix, Protonix, SSI, IV abx  Labs reviewed: creatinine 1.29 (H), magnesium 2.5 (H), hemoglobin 7.7 (L) CBG's: 110, 128, 150, 140, 120 x 24 hours  UOP: 1080 ml x 24 hours  NUTRITION - FOCUSED PHYSICAL EXAM:    Most Recent Value  Orbital Region  No depletion  Upper Arm Region  No depletion  Thoracic and Lumbar Region  No depletion  Buccal Region  Unable to assess  Temple Region  No depletion  Clavicle Bone Region  Mild depletion   Clavicle and Acromion Bone Region  Mild depletion  Scapular Bone Region  Unable to assess  Dorsal Hand  No depletion  Patellar Region  No depletion  Anterior Thigh Region  Mild depletion  Posterior Calf Region  No depletion  Edema (RD Assessment)  Mild  Hair  Reviewed  Eyes  Reviewed  Mouth  Unable to assess  Skin  Reviewed  Nails  Reviewed       Diet Order:   Diet Order            Diet NPO time specified  Diet effective now              EDUCATION NEEDS:   Not appropriate for education at this time  Skin:  Skin Assessment: Reviewed RN Assessment  Last BM:  unknown/PTA  Height:   Ht Readings from Last 1 Encounters:  10/05/18 5\' 11"  (1.803 m)    Weight:   Wt Readings from Last 1 Encounters:  10/07/18 89.1 kg    Ideal Body Weight:  78.18 kg  BMI:  Body mass index is 27.4 kg/m.  Estimated Nutritional Needs:   Kcal:  2007  Protein:  110-125 grams  Fluid:  >/= 2.0 L or per MD    Gaynell Face, MS, RD, LDN Inpatient Clinical Dietitian Pager: [phone] Weekend/After Hours: [phone]

## 2018-10-07 NOTE — Progress Notes (Signed)
Pharmacy Antibiotic Note  [name] is a 62 y.o. male  With VDRF and  pneumonia.  Pharmacy has been consulted for cefepime dosing (Day 3) -WBC= 8.9, afebrile, PCT= 1.29, CrCL ~ 60, cultures- ngts  Plan: -No cefepime dose changes needed -Consider a total of 7-8 days antibiotics -Will follow renal function, cultures and clinical progress    Height: 5\' 11"  (180.3 cm) Weight: 196 lb 6.9 oz (89.1 kg) IBW/kg (Calculated) : 75.3  Temp (24hrs), Avg:98.2 F (36.8 C), Min:97.9 F (36.6 C), Max:98.5 F (36.9 C)  Recent Labs  Lab 10/05/18 1707 10/05/18 1715 10/05/18 1743 10/06/18 0412 10/07/18 0304 10/07/18 0312  WBC  --   --  17.5* 11.2* 8.9  --   CREATININE 1.32* 1.20  --  1.30*  --  1.29*    Estimated Creatinine Clearance: 64 mL/min (A) (by C-G formula based on SCr of 1.29 mg/dL (H)).    Allergies  Allergen Reactions  . Penicillins Hives and Other (See Comments)    Has patient had a PCN reaction causing immediate rash, facial/tongue/throat swelling, SOB or lightheadedness with hypotension: No Has patient had a PCN reaction causing severe rash involving mucus membranes or skin necrosis: No Has patient had a PCN reaction that required hospitalization No Has patient had a PCN reaction occurring within the last 10 years: No If all of the above answers are "NO", then may proceed with Cephalosporin use.    Antimicrobials this admission: Cefepime 11/6>> Vancomycin 11/6>> 11/7  Microbiology results: 11/6 BCx: ngtd 11/6 UCx:-neg 11/6 TA: GPC 11/6 MRSA PCR- neg  Thank you for allowing pharmacy to be a part of this patient's care.  Hildred Laser, PharmD Clinical Pharmacist **Pharmacist phone directory can now be found on [email or web address] ([email or web address]).  Listed under [place].

## 2018-10-07 NOTE — Consult Note (Signed)
[name]       [place] [address]             [phone]          CARDIOTHORACIC SURGERY CONSULTATION REPORT  PCP is Nolene Ebbs, MD Referring Provider is Richard Rouge, MD  Reason for consultation:  Severe mitral regurgitation and coronary artery disease  HPI:  Patient is a 62 year old African-American male with history of schizophrenia, bipolar disorder with depression, spinal stenosis with chronic back pain, type 2 diabetes mellitus with complications, peripheral vascular disease, and hepatitis C who was admitted to the hospital October 05, 2018 with acute hypoxemic respiratory failure requiring intubation for mechanical ventilation, ruled in for acute non-ST segment elevation myocardial infarction, and has been referred for surgical consultation due to the presence of severe mitral regurgitation and coronary artery disease.  The patient presented emergently to the hospital 48 hours ago in respiratory distress.  By report he complained of shortness of breath and chest pain for 1 or 2 days.  Prior to that he apparently had a long history of both exertional and resting shortness of breath.  He was transported to the emergency room by EMS and given both aspirin and nitroglycerin with relief of chest pain.  EKG revealed sinus tachycardia with poor R wave progression but no acute ischemic changes.  Initial troponin level was markedly elevated at 4.22.  BNP level was 779.2.  Chest x-ray revealed cardiomegaly with pulmonary edema and small bilateral pleural effusions.  Echocardiogram revealed mild to moderate left ventricular systolic dysfunction with ejection fraction estimated 40 to 45%.  There was moderate to severe mitral regurgitation.  There was moderate left atrial enlargement.  There was moderate tricuspid regurgitation.  The patient had recently undergone an echocardiogram 1 month previously on September 07, 2018 when the patient was hospitalized for an episode of  rhabdomyolysis that revealed normal left ventricular systolic function and only mild mitral regurgitation.  While in the emergency department the patient's respiratory status deteriorated further requiring intubation and mechanical ventilation.  TEE was performed earlier today notable for the presence of severe left ventricular systolic dysfunction with ejection fraction estimated 35 to 40% there was inferior and basal hypokinesis.  There was severe mitral regurgitation.  There was no signs of ruptured papillary muscle.  Left and right heart catheterization was performed and notable for the presence of severe diffuse multivessel coronary artery disease with 90% proximal stenosis of the left circumflex coronary artery and nonobstructive disease in the left anterior descending coronary artery and right coronary territories.  There was severe pulmonary hypertension and severely elevated left ventricular end-diastolic pressure.  Cardiothoracic surgical consultation was requested.  The patient has no documented previous cardiac history.  The patient's entire history and review of systems is obtained from the patient's chart.  Patient is currently heavily sedated on a ventilator and will not follow commands.  There is no family present for consultation.    Past Medical History:  Diagnosis Date  . Anxiety   . Bipolar disorder ([place])   . Chronic back pain   . Depression   . Diabetes mellitus without complication ([place])   . Diabetic neuropathy ([place])   . DVT (deep venous thrombosis) ([place])   . Enlarged prostate   . Headache   . Hepatitis    Hepatitis C - has been treated with Harvoni (cleared in July, 2017)  . History of colon polyps   . Peripheral vascular disease ([place])   .  Pneumonia    hx of-about 82yr ago  . Rhabdomyolysis 08/30/2018  . Schizophrenia ([place]   . Spinal stenosis     Past Surgical History:  Procedure Laterality Date  . APPENDECTOMY     early 98's . BACK SURGERY  05/2011   x 5  .  blood clot in groin  early 80's  . CARPAL TUNNEL RELEASE     left  . CERVICAL SPINE SURGERY  2009  . COLONOSCOPY    . HERNIA REPAIR     early 2000's  . RIGHT/LEFT HEART CATH AND CORONARY ANGIOGRAPHY N/A 10/07/2018   Procedure: RIGHT/LEFT HEART CATH AND CORONARY ANGIOGRAPHY;  Surgeon: CSherren Mocha MD;  [facility];  Service: Cardiovascular;  Laterality: N/A;  . SHOULDER SURGERY  70's   right  . ULTRASOUND GUIDANCE FOR VASCULAR ACCESS  10/07/2018   Procedure: Ultrasound Guidance For Vascular Access;  Surgeon: CSherren Mocha MD;  [facility];  Service: Cardiovascular;;  . VENTRICULOPERITONEAL SHUNT N/A 10/30/2016   Procedure: Right Ventricular Peritoneal Shunt;  Surgeon: [name];  [facility]  Service: Neurosurgery;  Laterality: N/A;    Family History  Problem Relation Age of Onset  . Heart attack Father   . Anesthesia problems Neg Hx   . Hypotension Neg Hx   . Malignant hyperthermia Neg Hx   . Pseudochol deficiency Neg Hx     Social History   Socioeconomic History  . Marital status: Single    Spouse name: Not on file  . Number of children: Not on file  . Years of education: Not on file  . Highest education level: Not on file  Occupational History  . Occupation: disabled  Social Needs  . Financial resource strain: Not on file  . Food insecurity:    Worry: Not on file    Inability: Not on file  . Transportation needs:    Medical: Not on file    Non-medical: Not on file  Tobacco Use  . Smoking status: Current Every Day Smoker    Packs/day: 1.00    Years: 40.00    Pack years: 40.00    Types: Cigarettes  . Smokeless tobacco: Never Used  Substance and Sexual Activity  . Alcohol use: Yes    Comment: occasional use  . Drug use: Yes    Types: Cocaine, Marijuana  . Sexual activity: Yes  Lifestyle  . Physical activity:    Days per week: Not on file    Minutes per session: Not on file  . Stress: Not on file  Relationships   . Social connections:    Talks on phone: Not on file    Gets together: Not on file    Attends religious service: Not on file    Active member of club or organization: Not on file    Attends meetings of clubs or organizations: Not on file    Relationship status: Not on file  . Intimate partner violence:    Fear of current or ex partner: Not on file    Emotionally abused: Not on file    Physically abused: Not on file    Forced sexual activity: Not on file  Other Topics Concern  . Not on file  Social History Narrative  . Not on file    Prior to Admission medications   Medication Sig Start Date End Date Taking? Authorizing Provider  benztropine (COGENTIN) 1 MG tablet Take 1 mg by mouth 2 (two) times daily.  Yes [provider]  cholecalciferol (VITAMIN D) 1000 units tablet Take 1,000 Units by mouth daily.   Yes [provider]  diclofenac (VOLTAREN) 75 MG EC tablet Take 75 mg by mouth 2 (two) times daily as needed for mild pain.   Yes [provider]  DULoxetine (CYMBALTA) 60 MG capsule Take 60 mg by mouth 2 (two) times daily.   Yes [provider]  finasteride (PROSCAR) 5 MG tablet Take 5 mg by mouth daily.  12/09/17  Yes [provider]  gabapentin (NEURONTIN) 300 MG capsule Take 1 capsule (300 mg total) by mouth at bedtime. Patient taking differently: Take 300 mg by mouth 3 (three) times daily.  06/29/17  Yes [name], MD  ibuprofen (ADVIL,MOTRIN) 600 MG tablet Take 600 mg by mouth every 8 (eight) hours as needed for moderate pain.  05/27/17  Yes [provider]  INVEGA SUSTENNA 234 MG/1.5ML SUSP injection Inject 234 mg into the muscle every 30 (thirty) days.  12/08/17  Yes [provider]  metFORMIN (GLUCOPHAGE) 1000 MG tablet Take 1,000 mg by mouth 2 (two) times daily with a meal.    Yes [provider]  polyethylene glycol (MIRALAX / GLYCOLAX) packet Take 17 g by mouth daily. 09/09/18  Yes Ghimire, Henreitta Leber,  MD  sitaGLIPtin (JANUVIA) 100 MG tablet Take 100 mg by mouth daily with breakfast.   Yes [provider]  tamsulosin (FLOMAX) 0.4 MG CAPS capsule Take 0.4 mg by mouth daily after supper.    Yes [provider]  tiZANidine (ZANAFLEX) 4 MG tablet Take 4 mg by mouth at bedtime as needed for muscle spasms.   Yes [provider]  traZODone (DESYREL) 150 MG tablet Take 0.5 tablets (75 mg total) by mouth at bedtime. Patient taking differently: Take 300 mg by mouth at bedtime.  11/29/17  Yes Patrecia Pour, MD  ferrous gluconate (FERGON) 324 MG tablet Take 1 tablet (324 mg total) by mouth 2 (two) times daily with a meal. Patient not taking: Reported on 10/06/2018 06/30/17   [name], MD  lidocaine (LIDODERM) 5 % Place 1 patch onto the skin daily. Remove & Discard patch within 12 hours or as directed by MD Patient not taking: Reported on 10/06/2018 03/24/18   Maczis, Barth Kirks, PA-C  nicotine (NICODERM CQ - DOSED IN MG/24 HR) 7 mg/24hr patch Place 1 patch (7 mg total) onto the skin daily. Patient not taking: Reported on 10/06/2018 03/24/18   Maczis, Barth Kirks, PA-C  paliperidone (INVEGA) 3 MG 24 hr tablet Take 1 tablet (3 mg total) by mouth at bedtime. Patient not taking: Reported on 10/06/2018 06/29/17   [name], MD    Current Facility-Administered Medications  Medication Dose Route Frequency Provider Last Rate Last Dose  . 0.9 %  sodium chloride infusion  250 mL Intravenous PRN Sherren Mocha, MD   Stopped at 10/07/18 1054  . aspirin tablet 325 mg  325 mg Oral Daily Flora Lipps, MD   325 mg at 10/06/18 0921  . ceFEPIme (MAXIPIME) 2 g in sodium chloride 0.9 % 100 mL IVPB  2 g Intravenous Q12H Bertis Ruddy, RPH   Stopped at 10/07/18 1121  . chlorhexidine gluconate (MEDLINE KIT) (PERIDEX) 0.12 % solution 15 mL  15 mL Mouth Rinse BID Etheleen Nicks, MD   15 mL at 10/07/18 0830  . feeding supplement (PRO-STAT SUGAR FREE 64) liquid 30 mL  30 mL Per Tube BID Rigoberto Noel, MD      .  feeding supplement (VITAL 1.5 CAL) liquid 1,000 mL  1,000 mL Per Tube Continuous Rigoberto Noel, MD      . fentaNYL (SUBLIMAZE) bolus via infusion 50 mcg  50 mcg Intravenous Q1H PRN Desai, Rahul P, PA-C   50 mcg at 10/07/18 0415  . fentaNYL 2558mg in NS 2544m(104mml) infusion-PREMIX  25-400 mcg/hr Intravenous Continuous Desai, Rahul P, PA-C 12.5 mL/hr at 10/07/18 1428 125 mcg/hr at 10/07/18 1428  . furosemide (LASIX) injection 40 mg  40 mg Intravenous Q12H AlvRigoberto NoelD   40 mg at 10/07/18 1202  . heparin injection 5,000 Units  5,000 Units Subcutaneous Q8HLovenia KimD      . insulin aspart (novoLOG) injection 0-15 Units  0-15 Units Subcutaneous Q4H Desai, Rahul P, PA-C   2 Units at 10/07/18 0434  . ipratropium-albuterol (DUONEB) 0.5-2.5 (3) MG/3ML nebulizer solution 3 mL  3 mL Nebulization Once Hedges, JefDellis FilbertA-C      . MEDLINE mouth rinse  15 mL Mouth Rinse 10 times per day IsmEtheleen NicksD   15 mL at 10/07/18 1304  . midazolam (VERSED) injection 2 mg  2 mg Intravenous Q15 min PRN DesShearon Stallsahul P, PA-C   2 mg at 10/07/18 1632  . midazolam (VERSED) injection 2 mg  2 mg Intravenous Q2H PRN DesShearon Stallsahul P, PA-C   2 mg at 10/07/18 1631  . pantoprazole (PROTONIX) injection 40 mg  40 mg Intravenous QHS Desai, Rahul P, PA-C   40 mg at 10/06/18 2110  . sodium chloride flush (NS) 0.9 % injection 3 mL  3 mL Intravenous Q12H CooSherren MochaD      . sodium chloride flush (NS) 0.9 % injection 3 mL  3 mL Intravenous PRN CooSherren MochaD        Allergies  Allergen Reactions  . Penicillins Hives and Other (See Comments)    Has patient had a PCN reaction causing immediate rash, facial/tongue/throat swelling, SOB or lightheadedness with hypotension: No Has patient had a PCN reaction causing severe rash involving mucus membranes or skin necrosis: No Has patient had a PCN reaction that required hospitalization No Has patient had a PCN reaction occurring within the last 10  years: No If all of the above answers are "NO", then may proceed with Cephalosporin use.      Review of Systems:  Unable to obtain.     Physical Exam:   BP 104/66   Pulse 95   Temp 98.3 F (36.8 C) (Oral)   Resp 18   Ht '5\' 11"'$  (1.803 m)   Wt 89.1 kg   SpO2 100%   BMI 27.40 kg/m   General:  AA male intubated and sedated in ICU  HEENT:  Unremarkable, poor dentition  Neck:   no JVD, no bruits, no adenopathy   Chest:   Scattered rhonchi  CV:   RRR, no definite murmur   Abdomen:  soft, non-distended, no masses   Extremities:  warm, well-perfused, pulses diminished, no lower extremity edema  Rectal/GU  Deferred  Neuro:   Heavily sedated, moves all 4 extremities  Skin:   Clean and dry, no rashes, no breakdown  Diagnostic Tests:  Lab Results: Recent Labs    10/06/18 0412 10/07/18 0304  WBC 11.2* 8.9  HGB 8.9* 7.7*  HCT 28.1* 24.9*  PLT 148* 126*   BMET:  Recent Labs    10/06/18 0412 10/07/18 0312  NA 141 140  K 3.7 3.8  CL 109 109  CO2  22 23  GLUCOSE 151* 138*  BUN 15 21  CREATININE 1.30* 1.29*  CALCIUM 8.4* 8.3*    CBG (last 3)  Recent Labs    10/07/18 0827 10/07/18 1200 10/07/18 1610  GLUCAP 110* 110* 114*   PT/INR:  No results for input(s): LABPROT, INR in the last 72 hours.  CXR:  PORTABLE CHEST 1 VIEW  COMPARISON:  10/06/2018  FINDINGS: Endotracheal tube unchanged. Enteric tube has tip in the region of the stomach and side-port in the region of the expected gastroesophageal junction unchanged. Right-sided VP shunt unchanged. Lungs are adequately inflated demonstrate slight worsening hazy bilateral airspace opacification over the central and lower lungs which may be due to worsening interstitial edema or infection. Worse hazy bibasilar opacification likely bilateral effusions. Cardiomediastinal silhouette and remainder the exam is unchanged.  IMPRESSION: Worsening hazy bibasilar opacification likely bilateral pleural effusions.  Slight worsening hazy airspace process over the central and lower lungs likely interstitial edema, although infection is possible.  Tubes and lines unchanged.   Electronically Signed   By: Marin Olp M.D.   On: 10/07/2018 09:34   Transthoracic Echocardiography  Patient:    [name], [name] MR #:       [id number] Study Date: 09/07/2018 Gender:     M Age:        10 Height:     182.9 cm Weight:     82.9 kg BSA:        2.06 m^2 Pt. Status: Room:       [id number]   Amado Coe, Shanker M  REFERRING    Ghimire, Shanker M  SONOGRAPHER  Merrie Roof, RDCS  PERFORMING   [facility], Inpatient  ADMITTING    Janora Norlander  ATTENDING    Maudie Flakes  cc:  ------------------------------------------------------------------- LV EF: 60% -   65%  ------------------------------------------------------------------- Indications:      Pericardial effusion 423.9.  ------------------------------------------------------------------- History:   PMH:  Rhabdomyolysis. Transaminitis. Hypokalemia. Hypocalcemia. Metabolic acidosis. Spinal stenosis. BPH. Schizophrenia. Cocaine. Tobacco.  Risk factors:  Diabetes mellitus.   ------------------------------------------------------------------- Study Conclusions  - Left ventricle: The cavity size was normal. Systolic function was   normal. The estimated ejection fraction was in the range of 60%   to 65%. Possible hypokinesis of the apicalinferolateral   myocardium. The study is not technically sufficient to allow   evaluation of LV diastolic function. - Mitral valve: There was mild regurgitation. - Tricuspid valve: There was trivial regurgitation.  ------------------------------------------------------------------- Study data:  Comparison was made to the study of 10/12/2007.  Study status:  Routine.  Procedure:  The patient reported no pain pre or post test. Transthoracic echocardiography. Image quality was adequate.  Study  completion:  There were no complications. Transthoracic echocardiography.  M-mode, complete 2D, spectral Doppler, and color Doppler.  Birthdate:  Patient birthdate: 22-Oct-1956.  Age:  Patient is 62 yr old.  Sex:  Gender: male. BMI: 24.8 kg/m^2.  Blood pressure:     122/61  Patient status: Inpatient.  Study date:  Study date: 09/07/2018. Study time: 11:35 AM.  [facility]  [facility].  -------------------------------------------------------------------  ------------------------------------------------------------------- Left ventricle:  The cavity size was normal. Systolic function was normal. The estimated ejection fraction was in the range of 60% to 65%.  Regional wall motion abnormalities:   Possible hypokinesis of the apicalinferolateral myocardium. The study is not technically sufficient to allow evaluation of LV diastolic function.  ------------------------------------------------------------------- Aortic valve:   Trileaflet; normal thickness leaflets. Mobility was not restricted.  Doppler:  Transvalvular velocity was within the normal range. There was no stenosis. There was no regurgitation.   ------------------------------------------------------------------- Aorta:  Aortic root: The aortic root was normal in size.  ------------------------------------------------------------------- Mitral valve:   Structurally normal valve.   Mobility was not restricted.  Doppler:  Transvalvular velocity was within the normal range. There was no evidence for stenosis. There was mild regurgitation.    Peak gradient (D): 4 mm Hg.  ------------------------------------------------------------------- Left atrium:  The atrium was normal in size.  ------------------------------------------------------------------- Right ventricle:  The cavity size was normal. Wall thickness was normal. Systolic function was  normal.  ------------------------------------------------------------------- Pulmonic valve:    Structurally normal valve.   Cusp separation was normal.  Doppler:  Transvalvular velocity was within the normal range. There was no evidence for stenosis. There was no regurgitation.  ------------------------------------------------------------------- Tricuspid valve:   Structurally normal valve.    Doppler: Transvalvular velocity was within the normal range. There was trivial regurgitation.  ------------------------------------------------------------------- Pulmonary artery:   The main pulmonary artery was normal-sized. Systolic pressure was within the normal range.  ------------------------------------------------------------------- Right atrium:  The atrium was normal in size.  ------------------------------------------------------------------- Pericardium:  There was no pericardial effusion.  ------------------------------------------------------------------- Systemic veins: Inferior vena cava: The vessel was normal in size.  ------------------------------------------------------------------- Measurements   Left ventricle                         Value        Reference  LV ID, ED, PLAX chordal                48    mm     43 - 52  LV ID, ES, PLAX chordal                31    mm     23 - 38  LV fx shortening, PLAX chordal         35    %      >=29  LV PW thickness, ED                    9     mm     ----------  IVS/LV PW ratio, ED                    1.11         <=1.3  Stroke volume, 2D                      72    ml     ----------  Stroke volume/bsa, 2D                  35    ml/m^2 ----------  LV e&', lateral                         8.49  cm/s   ----------  LV E/e&', lateral                       12.13        ----------  LV e&', medial                          6.96  cm/s   ----------  LV E/e&', medial  14.8         ----------  LV e&', average                          7.73  cm/s   ----------  LV E/e&', average                       13.33        ----------    Ventricular septum                     Value        Reference  IVS thickness, ED                      10    mm     ----------    LVOT                                   Value        Reference  LVOT ID, S                             21    mm     ----------  LVOT area                              3.46  cm^2   ----------  LVOT peak velocity, S                  101   cm/s   ----------  LVOT mean velocity, S                  72.3  cm/s   ----------  LVOT VTI, S                            20.8  cm     ----------  LVOT peak gradient, S                  4     mm Hg  ----------    Aorta                                  Value        Reference  Aortic root ID, ED                     27    mm     ----------    Left atrium                            Value        Reference  LA ID, A-P, ES                         30    mm     ----------  LA ID/bsa, A-P                         1.46  cm/m^2 <=2.2  LA volume, S  47.4  ml     ----------  LA volume/bsa, S                       23    ml/m^2 ----------  LA volume, ES, 1-p A4C                 39.4  ml     ----------  LA volume/bsa, ES, 1-p A4C             19.1  ml/m^2 ----------  LA volume, ES, 1-p A2C                 53.6  ml     ----------  LA volume/bsa, ES, 1-p A2C             26    ml/m^2 ----------    Mitral valve                           Value        Reference  Mitral E-wave peak velocity            103   cm/s   ----------  Mitral A-wave peak velocity            89.7  cm/s   ----------  Mitral peak gradient, D                4     mm Hg  ----------  Mitral E/A ratio, peak                 1.1          ----------    Pulmonary arteries                     Value        Reference  PA pressure, S, DP                     30    mm Hg  <=30    Tricuspid valve                        Value        Reference  Tricuspid  regurg peak velocity         258   cm/s   ----------  Tricuspid peak RV-RA gradient          27    mm Hg  ----------    Right atrium                           Value        Reference  RA ID, S-I, ES, A4C                    47.9  mm     34 - 49  RA area, ES, A4C                       16.1  cm^2   8.3 - 19.5  RA volume, ES, A/L                     45.1  ml     ----------  RA volume/bsa, ES, A/L  21.9  ml/m^2 ----------    Systemic veins                         Value        Reference  Estimated CVP                          3     mm Hg  ----------    Right ventricle                        Value        Reference  TAPSE                                  24.3  mm     ----------  RV pressure, S, DP                     30    mm Hg  <=30  RV s&', lateral, S                      12.2  cm/s   ----------  Legend: (L)  and  (H)  mark values outside specified reference range.  ------------------------------------------------------------------- Prepared and Electronically Authenticated by  Fransico Him, MD 2019-10-09T12:21:55   Transthoracic Echocardiography  Patient:    [name], [name] MR #:       [id number] Study Date: 10/06/2018 Gender:     M Age:        5 Height:     180.3 cm Weight:     88.5 kg BSA:        2.12 m^2 Pt. Status: Room:       [facility]   SONOGRAPHER  Dustin Flock, RCS  ATTENDING    [place] [place], In[facility]  ORDERING     Tommie Raymond  REFERRING    Kathyrn Drown D  cc:  ------------------------------------------------------------------- LV EF: 40% -   45%  ------------------------------------------------------------------- Indications:      Chest pain 786.51.  ------------------------------------------------------------------- History:   PMH:  Aspiration PNA, schizophrenia, substance abuse. Dyspnea and altered mental status.  Acute myocardial infarction. Risk factors:  Diabetes  mellitus.  ------------------------------------------------------------------- Study Conclusions  - Left ventricle: The cavity size was mildly dilated. Systolic   function was mildly to moderately reduced. The estimated ejection   fraction was in the range of 40% to 45%. Diffuse mild   hypokinesis. Moderate hypokinesis of the anteroseptal, lateral,   inferolateral, inferior, and inferoseptal myocardium. Doppler   parameters are consistent with a reversible restrictive pattern,   indicative of decreased left ventricular diastolic compliance   and/or increased left atrial pressure (grade 3 diastolic   dysfunction). - Aortic valve: There was trivial regurgitation. - Mitral valve: There was moderate to severe regurgitation. - Left atrium: The atrium was moderately dilated. - Tricuspid valve: There was moderate regurgitation. - Pulmonary arteries: Systolic pressure was moderately increased.   PA peak pressure: 38 mm Hg (S). - Pericardium, extracardiac: A trivial pericardial effusion was   identified.  Impressions:  - Reduced LV EF. Appears mildly hypokinetic globally, with moderate   hypokinesis of septal/inferior/lateral walls. Grade 3 diastolic   dysfunction. Moderate to severe MR, moderate TR.  ------------------------------------------------------------------- Study data:  Comparison was made to the study of 09/07/2018.  Study status:  Routine.  Procedure:  The patient reported no pain pre or post test. Transthoracic echocardiography. Image quality was adequate.          Transthoracic echocardiography.  M-mode, complete 2D, spectral Doppler, and color Doppler.  Birthdate: Patient birthdate: Aug 28, 1956.  Age:  Patient is 62 yr old.  Sex: Gender: male.    BMI: 27.2 kg/m^2.  Blood pressure:     90/63 Patient status:  Inpatient.  Study date:  Study date: 10/06/2018. Study time: 11:04 AM.  Location:   Bedside.  -------------------------------------------------------------------  ------------------------------------------------------------------- Left ventricle:  The cavity size was mildly dilated. Systolic function was mildly to moderately reduced. The estimated ejection fraction was in the range of 40% to 45%. Diffuse mild hypokinesis. Regional wall motion abnormalities:  Moderate hypokinesis of the anteroseptal, lateral, inferolateral, inferior, and inferoseptal myocardium. Doppler parameters are consistent with a reversible restrictive pattern, indicative of decreased left ventricular diastolic compliance and/or increased left atrial pressure (grade 3 diastolic dysfunction).  ------------------------------------------------------------------- Aortic valve:   Trileaflet; normal thickness leaflets. Mobility was not restricted.  Doppler:  Transvalvular velocity was within the normal range. There was no stenosis. There was trivial regurgitation.  ------------------------------------------------------------------- Aorta:  Aortic root: The aortic root was normal in size.  ------------------------------------------------------------------- Mitral valve:   Structurally normal valve.   Mobility was not restricted.  Doppler:  Transvalvular velocity was within the normal range. There was no evidence for stenosis. There was moderate to severe regurgitation.    Valve area by pressure half-time: 6.29 cm^2. Indexed valve area by pressure half-time: 2.97 cm^2/m^2. Peak gradient (D): 5 mm Hg.  ------------------------------------------------------------------- Left atrium:  The atrium was moderately dilated.  ------------------------------------------------------------------- Right ventricle:  The cavity size was normal. Wall thickness was normal. Systolic function was normal.  ------------------------------------------------------------------- Pulmonic valve:   Poorly  visualized.  Doppler:  Transvalvular velocity was within the normal range. There was no evidence for stenosis.  ------------------------------------------------------------------- Tricuspid valve:   Structurally normal valve.    Doppler: Transvalvular velocity was within the normal range. There was moderate regurgitation.  ------------------------------------------------------------------- Pulmonary artery:   The main pulmonary artery was normal-sized. Systolic pressure was moderately increased.  ------------------------------------------------------------------- Right atrium:  The atrium was normal in size.  ------------------------------------------------------------------- Pericardium:  Not well visualized. A trivial pericardial effusion was identified.  ------------------------------------------------------------------- Systemic veins: Inferior vena cava: The vessel was normal in size. The respirophasic diameter changes were blunted (< 50%).  ------------------------------------------------------------------- Measurements   Left ventricle                          Value          Reference  LV ID, ED, PLAX chordal                 52    mm       43 - 52  LV ID, ES, PLAX chordal         (H)     42    mm       23 - 38  LV fx shortening, PLAX chordal  (L)     19    %        >=29  LV PW thickness, ED                     11    mm       ----------  IVS/LV PW  ratio, ED                     1.09           <=1.3  Stroke volume, 2D                       42    ml       ----------  Stroke volume/bsa, 2D                   20    ml/m^2   ----------  LV ejection fraction, 1-p A4C           52    %        ----------  LV e&', lateral                          5     cm/s     ----------  LV E/e&', lateral                        22.2           ----------  LV e&', medial                           4.68  cm/s     ----------  LV E/e&', medial                         23.72          ----------   LV e&', average                          4.84  cm/s     ----------  LV E/e&', average                        22.93          ----------    Ventricular septum                      Value          Reference  IVS thickness, ED                       12    mm       ----------    LVOT                                    Value          Reference  LVOT ID, S                              21    mm       ----------  LVOT area                               3.46  cm^2     ----------  LVOT peak velocity, S                   62.7  cm/s     ----------  LVOT mean velocity, S                   40.2  cm/s     ----------  LVOT VTI, S                             12.1  cm       ----------  LVOT peak gradient, S                   2     mm Hg    ----------    Aorta                                   Value          Reference  Aortic root ID, ED                      31    mm       ----------    Left atrium                             Value          Reference  LA ID, A-P, ES                          36    mm       ----------  LA ID/bsa, A-P                          1.7   cm/m^2   <=2.2  LA volume, S                            68.1  ml       ----------  LA volume/bsa, S                        32.1  ml/m^2   ----------  LA volume, ES, 1-p A4C                  65.3  ml       ----------  LA volume/bsa, ES, 1-p A4C              30.8  ml/m^2   ----------  LA volume, ES, 1-p A2C                  70.2  ml       ----------  LA volume/bsa, ES, 1-p A2C              33.1  ml/m^2   ----------    Mitral valve                            Value          Reference  Mitral E-wave peak velocity             111   cm/s     ----------  Mitral A-wave peak velocity             41.9  cm/s     ----------  Mitral deceleration time        (L)     120   ms       150 - 230  Mitral pressure half-time               35    ms       ----------  Mitral peak gradient, D                 5     mm Hg    ----------  Mitral E/A ratio, peak                   2.6            ----------  Mitral valve area, PHT, DP              6.29  cm^2     ----------  Mitral valve area/bsa, PHT, DP          2.97  cm^2/m^2 ----------    Pulmonary arteries                      Value          Reference  PA pressure, S, DP              (H)     38    mm Hg    <=30    Tricuspid valve                         Value          Reference  Tricuspid regurg peak velocity          273   cm/s     ----------  Tricuspid peak RV-RA gradient           30    mm Hg    ----------    Right atrium                            Value          Reference  RA ID, S-I, ES, A4C                     46.3  mm       34 - 49  RA area, ES, A4C                        13.5  cm^2     8.3 - 19.5  RA volume, ES, A/L                      32.4  ml       ----------  RA volume/bsa, ES, A/L                  15.3  ml/m^2   ----------    Systemic veins                          Value          Reference  Estimated CVP                           8     mm Hg    ----------    Right ventricle  Value          Reference  RV ID, minor axis, ED, A4C base         21    mm       ----------  TAPSE                                   17.1  mm       ----------  RV pressure, S, DP              (H)     38    mm Hg    <=30  RV s&', lateral, S                       8.7   cm/s     ----------  Legend: (L)  and  (H)  mark values outside specified reference range.  ------------------------------------------------------------------- Prepared and Electronically Authenticated by  Buford Dresser 2019-11-07T15:15:26   TEE: During this procedure the patient is administered a total of Versed 5 mg and Fentanyl drip /  mg to achieve and maintain moderate conscious sedation.  The patient's heart rate, blood pressure, and oxygen saturation are monitored continuously during the procedure. The period of conscious sedation is 30 minutes, of which I was present face-to-face 100% of this time.  Mild LV  dilatation EF 35-40% inferior basal hypokinesis Severe central MR but no ruptured chords, no flail leaflet and no ruptured papillary muscle Normal RV No LAA thrombus No effusion Mild TR Normal AV  Patient will have right and left heart cath today with Dr Burt Knack CVTS has been made aware of patient but currently no acute indication for surgery Will depend also on what coronary artery looks like   [name]   RIGHT/LEFT HEART CATH AND CORONARY ANGIOGRAPHY  Conclusion     Prox LAD to Mid LAD lesion is 50% stenosed.  Ost 1st Diag to 1st Diag lesion is 70% stenosed.  Ost 1st Mrg lesion is 85% stenosed.  Prox Cx lesion is 90% stenosed.  Mid Cx lesion is 80% stenosed.  Prox RCA to Mid RCA lesion is 40% stenosed.  Hemodynamic findings consistent with moderate pulmonary hypertension.  Ost 3rd Mrg to 3rd Mrg lesion is 90% stenosed.   1. Severe stenosis of the left circumflex - small diffusely diseased vessels 2. Diffuse nonobstructive stenosis of the LAD and RCA 3. Widely patent left main 4. Severely elevated LVEDP 5. Moderate pulmonary HTN likely secondary to left heart failure (transpulmonic gradient 5-10 mmHg) 6. Severe MR by noninvasive assessment  The circumflex/OM branches are borderline for PCI or grafting - targets appear 2 mm or less     Indications   Acute systolic heart failure (HCC) [I50.21 (ICD-10-CM)]  Non-ST elevation (NSTEMI) myocardial infarction (HCC) [I21.4 (ICD-10-CM)]  Procedural Details/Technique   Technical Details INDICATION: NSTEMI, acute heart failure, severe ischemic MR. Pt on the ventilator with difficulty weaning.  PROCEDURAL DETAILS: The right groin was prepped, draped, and anesthetized with 1% lidocaine. Using direct ultrasound guidance a 5 French sheath was placed in the right femoral artery and a 7 French sheath was placed in the right femoral vein. [place] images are captured and stored in the patient's chart. A Swan-Ganz catheter  was used for the right heart catheterization. Standard protocol was followed for recording of right heart pressures and sampling of oxygen saturations. Fick cardiac output was calculated. Standard  Judkins catheters were used for selective coronary angiography. LV pressure is recorded with a JR4 catheter. There were no immediate procedural complications. The patient was transferred to the post catheterization recovery area for further monitoring.     Estimated blood loss <50 mL.  During this procedure the patient was administered the following to achieve and maintain moderate conscious sedation: Versed 2 mg, while the patient's heart rate, blood pressure, and oxygen saturation were continuously monitored.  Coronary Findings   Diagnostic  Dominance: Right  Left Main  The vessel exhibits minimal luminal irregularities.  Left Anterior Descending  Large vessels, wraps the LV apex  Prox LAD to Mid LAD lesion 50% stenosed  Prox LAD to Mid LAD lesion is 50% stenosed. The lesion is moderately calcified. 40-50 % stenosis  First Diagonal Branch  Ost 1st Diag to 1st Diag lesion 70% stenosed  Ost 1st Diag to 1st Diag lesion is 70% stenosed. small vessel not suitable for PCI or CABG  Left Circumflex  The circumflex is severely diseased and calcified. The vessel and it's branches are small in caliber.  Prox Cx lesion 90% stenosed  Prox Cx lesion is 90% stenosed.  Mid Cx lesion 80% stenosed  Mid Cx lesion is 80% stenosed. The lesion is calcified.  First Obtuse Marginal Branch  Ost 1st Mrg lesion 85% stenosed  Ost 1st Mrg lesion is 85% stenosed. The lesion is calcified.  Third Obtuse Marginal Branch  Ost 3rd Mrg to 3rd Mrg lesion 90% stenosed  Ost 3rd Mrg to 3rd Mrg lesion is 90% stenosed.  Right Coronary Artery  Prox RCA to Mid RCA lesion 40% stenosed  Prox RCA to Mid RCA lesion is 40% stenosed. diffusely diseased RCA, no high-grade obstruction.  Intervention   No interventions have been  documented.  Right Heart   Right Heart Pressures Hemodynamic findings consistent with moderate pulmonary hypertension.  Coronary Diagrams   Diagnostic Diagram       Implants    Vascular Products  Closure Mynx Control [id number] Implanted    Inventory item: CLOSURE [facility] HospitalCONTROL 65F Model/Cat number: [id number] Manufacturer: CCopper[place]Lot number: [id number] Device identifier: [id number] identifier type: GS1  GUDID Information   Request status Successful    Brand name: MOld Vine[facility]CONTROL Version/Model: [id number] Company name: ADriscollsafety info as of 10/07/18: MR Safe  Contains dry or latex rubber: No    GMDN P.T. name: Wound hydrogel dressing, sterile    As of 10/07/2018   Status: Implanted      Closure Mynx Control 648ff - [id number] Implanted    Inventory item: CLOSURE MYB[facility] Surgery Center LLCONTROL 21F/2F Model/Cat number: [id number]: COTakoma Parkot number: [id number] identifier: [id number] identifier type: GS1  GUDID Information   Request status Successful    Brand name: MYNX CONTROL Version/Model: [id number] name: AcSugar MountainMRI safety info as of 10/07/18: MR Safe  Contains dry or latex rubber: No    GMDN P.T. name: Wound hydrogel dressing, sterile    As of 10/07/2018   Status: Implanted      MERGE Images   Show images for CARDIAC CATHETERIZATION   Link to Procedure Log   Procedure Log    Hemo Data    Most Recent Value  Fick Cardiac Output 6.99 L/min  Fick Cardiac Output Index 3.34 (L/min)/BSA  RA A Wave 12 mmHg  RA V Wave 0 mmHg  RA  Mean 12 mmHg  RV Systolic Pressure 52 mmHg  RV Diastolic Pressure 10 mmHg  RV EDP 13 mmHg  PA Systolic Pressure 52 mmHg  PA Diastolic Pressure 25 mmHg  PA Mean 37 mmHg  PW A Wave 26 mmHg  PW V Wave 0 mmHg  PW Mean 27 mmHg  AO Systolic Pressure 85 mmHg  AO Diastolic Pressure 54 mmHg  AO Mean 67 mmHg  LV Systolic Pressure 197 mmHg  LV Diastolic  Pressure 8 mmHg  LV EDP 32 mmHg  AOp Systolic Pressure 96 mmHg  AOp Diastolic Pressure 57 mmHg  AOp Mean Pressure 71 mmHg  LVp Systolic Pressure 99 mmHg  LVp Diastolic Pressure 9 mmHg  LVp EDP Pressure 29 mmHg  QP/QS 1  TPVR Index 11.07 HRUI  TSVR Index 20.05 HRUI  PVR SVR Ratio 0.18  TPVR/TSVR Ratio 0.55      Impression:  Patient was admitted to the hospital in acute hypoxemic respiratory failure in the setting of an acute non-ST segment elevation myocardial infarction complicated by acute systolic congestive heart failure.  I have personally reviewed the patient's multiple recent echocardiograms and diagnostic cardiac catheterization.  Echocardiograms performed during this admission demonstrate new onset severe left ventricular systolic dysfunction with inferior basal hypokinesis, global left ventricular chamber enlargement, and severe secondary mitral regurgitation.  There is no evidence for papillary muscle rupture, mitral valve prolapse, nor other significant primary mitral valve pathology.  Approximately month ago the patient's left ventricular systolic function appeared essentially normal and there was only mild central mitral regurgitation.  Catheterization demonstrates high-grade proximal stenosis of the left circumflex coronary artery with diffusely diseased and relatively small terminal branches of left circumflex system.  There is no significant flow-limiting disease in the left main coronary artery, the left anterior descending coronary artery, nor the right coronary artery.   Left ventricular end-diastolic pressure is severely elevated and there was moderate pulmonary hypertension.  There are no indications for surgical intervention of any type at this time.    Plan:  I recommend consultation with the advanced heart failure team to consider initiation of inotropic support and aggressive intensive medical therapy.  We will follow along periodically but please call if specific  questions arise.   I spent in excess of 90 minutes during the conduct of this hospital consultation and >50% of this time involved direct face-to-face encounter for counseling and/or coordination of the patient's care.    Valentina Gu. Roxy Manns, MD 10/07/2018 4:47 PM

## 2018-10-07 NOTE — Progress Notes (Signed)
  Echocardiogram 2D Echocardiogram has been performed.  Jennette Dubin 10/07/2018, 8:18 AM

## 2018-10-07 NOTE — Progress Notes (Signed)
[facility] for Heparin Indication: chest pain/ACS  Allergies  Allergen Reactions  . Penicillins Hives and Other (See Comments)    Has patient had a PCN reaction causing immediate rash, facial/tongue/throat swelling, SOB or lightheadedness with hypotension: No Has patient had a PCN reaction causing severe rash involving mucus membranes or skin necrosis: No Has patient had a PCN reaction that required hospitalization No Has patient had a PCN reaction occurring within the last 10 years: No If all of the above answers are "NO", then may proceed with Cephalosporin use.    Patient Measurements: Height: 5' 11" (180.3 cm) Weight: 195 lb 1.7 oz (88.5 kg) IBW/kg (Calculated) : 75.3  Vital Signs: Temp: 98.3 F (36.8 C) (11/08 0000) Temp Source: Axillary (11/08 0000) BP: 118/71 (11/08 0400) Pulse Rate: 95 (11/08 0400)  Labs: Recent Labs    10/05/18 1715 10/05/18 1743  10/05/18 2147 10/06/18 0412 10/06/18 0848 10/06/18 1146 10/06/18 1659 10/07/18 0304 10/07/18 0312  HGB 10.2* 10.2*  --   --  8.9*  --   --   --  7.7*  --   HCT 30.0* 32.5*  --   --  28.1*  --   --   --  24.9*  --   PLT  --  198  --   --  148*  --   --   --  126*  --   HEPARINUNFRC  --   --    < > 0.16*  --  0.10*  --  0.21* 0.29*  --   CREATININE 1.20  --   --   --  1.30*  --   --   --   --  1.29*  TROPONINI  --   --   --  13.94* 26.21*  --  12.73*  --   --   --    < > = values in this interval not displayed.    Estimated Creatinine Clearance: 64 mL/min (A) (by C-G formula based on SCr of 1.29 mg/dL (H)).   Medical History: Past Medical History:  Diagnosis Date  . Anxiety   . Bipolar disorder ([place])   . Chronic back pain   . Depression   . Diabetes mellitus without complication ([place])   . Diabetic neuropathy ([place])   . DVT (deep venous thrombosis) ([place])   . Enlarged prostate   . Headache   . Hepatitis    Hepatitis C - has been treated with Harvoni (cleared in July,  2017)  . History of colon polyps   . Peripheral vascular disease ([place])   . Pneumonia    hx of-about 52yr ago  . Rhabdomyolysis 08/30/2018  . Schizophrenia ([place]   . Spinal stenosis     Medications:  Scheduled:  . aspirin  325 mg Oral Daily  . chlorhexidine gluconate (MEDLINE KIT)  15 mL Mouth Rinse BID  . insulin aspart  0-15 Units Subcutaneous Q4H  . ipratropium-albuterol  3 mL Nebulization Once  . mouth rinse  15 mL Mouth Rinse 10 times per day  . pantoprazole (PROTONIX) IV  40 mg Intravenous QHS    Assessment: [id number] presenting with CP,  Pharmacy consulted for heparin, no AC PTA.  11/8 AM update: heparin level just below goal this AM, Hgb down to 7.7, no bleeding or infusion issues per RN.  Goal of Therapy:  Heparin level 0.3-0.7 units/ml Monitor platelets by anticoagulation protocol: Yes   Plan:  Inc heparin to 1700 units/hr 1300 HL Trend  Hgb  Narda Bonds 10/07/2018,4:41 AM

## 2018-10-07 NOTE — Progress Notes (Signed)
TEE: During this procedure the patient is administered a total of Versed 5 mg and Fentanyl drip /  mg to achieve and maintain moderate conscious sedation.  The patient's heart rate, blood pressure, and oxygen saturation are monitored continuously during the procedure. The period of conscious sedation is 30 minutes, of which I was present face-to-face 100% of this time.  Mild LV dilatation EF 35-40% inferior basal hypokinesis Severe central MR but no ruptured chords, no flail leaflet and no ruptured papillary muscle Normal RV No LAA thrombus No effusion Mild TR Normal AV  Patient will have right and left heart cath today with Dr Burt Knack CVTS has been made aware of patient but currently no acute indication for surgery Will depend also on what coronary artery looks like   Jenkins Rouge

## 2018-10-07 NOTE — Progress Notes (Signed)
62 year old man was admitted 11/6 with chest pain shortness of breath and severe hypoxia failing BiPAP requiring intubation in the ED Recent admission for 10 days in October for rhabdomyolysis and pericardial effusion UDS positive for cocaine on both occasions  Echo showed severe MR with several WMA and EF 40% TEE confirmed MR but no flail leaflet Underwent cardiac cath -severely diseased left circumflex but not amenable to PCI, highly elevated LVEDP.  On exam -sedated on fentanyl drip, agitation and wake-up assessment, PEEP of 10, bilateral scattered crackles, no rhonchi, S1-S2 normal, no edema.  Chest x-ray personally reviewed which showed markedly improved infiltrates on 11/7 compared to 11/6 but worse again on 11/8 with bilateral effusions.  Labs show troponins peaked at 26 and not decreasing, procalcitonin peaked at 1.6 and now decreasing, leukocytosis resolved.  Impression/plan  Acute systolic heart failure/cocaine use/non-STEMI/severe MR/CAD -Not amenable to PCI, plan to be outlined by cardiology -Diuresis with Lasix as renal function permits.  Acute Hypoxic respiratory failure-seems to be pulmonary edema rather than pneumonia -Keep PEEP at 10 until we achieve some diuresis, -Hopefully proceed with spontaneous breathing trials in 1 day -Discontinue vancomycin, continue empiric cefepime for total 5 days  Acute encephalopathy/agitation-goal RA SS -1 on fent gtt Resume his home antipsychotics and trazodone  The patient is critically ill with multiple organ systems failure and requires high complexity decision making for assessment and support, frequent evaluation and titration of therapies, application of advanced monitoring technologies and extensive interpretation of multiple databases. Critical Care Time devoted to patient care services described in this note independent of APP/resident  time is 35 minutes.   Leanna Sato Elsworth Soho MD

## 2018-10-07 NOTE — Progress Notes (Signed)
Progress Note  Patient Name: [name] Date of Encounter: 10/07/2018  Primary Cardiologist: Johnsie Cancel  Subjective   Intubated sedated   Inpatient Medications    Scheduled Meds: . aspirin  325 mg Oral Daily  . chlorhexidine gluconate (MEDLINE KIT)  15 mL Mouth Rinse BID  . insulin aspart  0-15 Units Subcutaneous Q4H  . ipratropium-albuterol  3 mL Nebulization Once  . mouth rinse  15 mL Mouth Rinse 10 times per day  . pantoprazole (PROTONIX) IV  40 mg Intravenous QHS   Continuous Infusions: . sodium chloride 20 mL/hr at 10/07/18 0600  . ceFEPime (MAXIPIME) IV Stopped (10/06/18 2142)  . fentaNYL infusion INTRAVENOUS 125 mcg/hr (10/07/18 0600)  . heparin 1,700 Units/hr (10/07/18 0600)   PRN Meds: fentaNYL, midazolam, midazolam   Vital Signs    Vitals:   10/07/18 0300 10/07/18 0400 10/07/18 0500 10/07/18 0600  BP: 92/61 118/71 104/68 92/62  Pulse: 74 95 83 81  Resp: _0 Temp:  98.4 F (36.9 C)    TempSrc:  Oral    SpO2: 99% 98% 100% 99%  Weight:   89.1 kg   Height:        Intake/Output Summary (Last 24 hours) at 10/07/2018 0732 Last data filed at 10/07/2018 0600 Gross per 24 hour  Intake 1350.44 ml  Output 1080 ml  Net 270.44 ml   Filed Weights   10/05/18 1600 10/06/18 0416 10/07/18 0500  Weight: 81.6 kg 88.5 kg 89.1 kg    Telemetry    NSR no VT - Personally Reviewed  ECG    NSR with tachycardia had diffuse inferolateral ST depression  - Personally Reviewed  Physical Exam  Intubated Sedated  GEN: No acute distress.   Neck: No JVD Cardiac: cannot hear MR murmur   Respiratory: basilar rales  GI: Soft, nontender, non-distended  MS: No edema; No deformity. Neuro:  Nonfocal  Psych: Seems lethargic on vent Plus one bilateral edema  Labs    Chemistry Recent Labs  Lab 10/05/18 1707 10/05/18 1715 10/06/18 0412 10/07/18 0304 10/07/18 0312  NA 140 142 141  --  140  K 3.2* 3.6 3.7  --  3.8  CL 107 106 109  --  109  CO2 23  --  22   --  23  GLUCOSE 240* 239* 151*  --  138*  BUN _1 --  21  CREATININE 1.32* 1.20 1.30*  --  1.29*  CALCIUM 8.8*  --  8.4*  --  8.3*  PROT  --   --   --  6.0*  --   ALBUMIN  --   --   --  2.6* 2.6*  AST  --   --   --  34  --   ALT  --   --   --  17  --   ALKPHOS  --   --   --  63  --   BILITOT  --   --   --  0.8  --   GFRNONAA 57*  --  58*  --  58*  GFRAA >60  --  >60  --  >60  ANIONGAP 10  --  10  --  8     Hematology Recent Labs  Lab 10/05/18 1743 10/06/18 0412 10/07/18 0304  WBC 17.5* 11.2* 8.9  RBC 3.27* 2.92* 2.55*  HGB 10.2* 8.9* 7.7*  HCT 32.5* 28.1* 24.9*  MCV 99.4 96.2 97.6  MCH 31.2 30.5 30.2  MCHC 31.4 31.7 30.9  RDW 12.0 12.3 12.4  PLT 198 148* 126*    Cardiac Enzymes Recent Labs  Lab 10/05/18 1707 10/05/18 2147 10/06/18 0412 10/06/18 1146  TROPONINI 5.34* 13.94* 26.21* 12.73*    Recent Labs  Lab 10/05/18 1446  TROPIPOC 4.22*     BNP Recent Labs  Lab 10/05/18 1436  BNP 779.2*     DDimer  Recent Labs  Lab 10/05/18 1436  DDIMER 1.50*     Radiology      Cardiac Studies   TTE pending   Patient Profile     62 y.o. male with substance abuse. Recent prolonged admission for rhabomyolysis. Admitted With respiratory distress and altered MS. CHF with ? Aspiration pneumonia requiring intubation Troponin elevated 26 now  Assessment & Plan    SEMI:  Etiology not clear no ST elevation Nurse indicates UDS positive for cocaine again. Continue Diuresis and heparin Limited TTE review shows ischemic MR that is severe will proceed with TEE This am to r/o flail leaflet or ruptured papillary Likely have right and left cath while intubated and surgical consult If MV apparatus disrupted  Pulm:  CXR yesterday much improved but requiring high FIo2 and PEEP 8 per CCM       For questions or updates, please contact [facility] Please consult [email or web address] for contact info under        Signed, Jenkins Rouge, MD  10/07/2018, 7:32 AM

## 2018-10-07 NOTE — Progress Notes (Signed)
NAME:  [name], MRN:  [id number], DOB:  February 04, 1956, LOS: 2 ADMISSION DATE:  10/05/2018, CONSULTATION DATE:  10/05/18 REFERRING MD:  Darl Householder CHIEF COMPLAINT:  SOB   Brief History   [name] is a 62 y.o. male who was admitted 11/6 with NSTEMI and acute hypoxic respiratory failure requiring intubation.  Past Medical History  Spinal stenosis, shizhophrenia, bipolar disorder, depression, anxiety, polysubstance abuse, rhabdo, PVD, DVT, HCV s/p treatment with Harvoni, diabetic neuropathy, DM.  Significant Hospital Events   11/6 > admit. 11/8 > TEE/ LHC  Consults: date of consult/date signed off & final recs:  Cardiology 11/6 >   Procedures (surgical and bedside):  ETT 11/6 >   Significant Diagnostic Tests:  CXR 11/6 > cardiomegaly, small b/l effusions, b/l GGO's. Echo 11/7 >  Reduced LV EF 40-45% (down from 60-65% on 08/2018). Appears mildly hypokinetic globally, with moderate hypokinesis of septal/inferior/lateral walls. Grade 3 diastolic dysfunction. Moderate to severe MR, moderate TR.  LE duplex 11/7 > neg  TEE 11/8  >> prelim- > severe MR, no papillary muscle rupture noted  LHC 11/8 >>  1. Severe stenosis of the left circumflex - small diffusely diseased vessels 2. Diffuse nonobstructive stenosis of the LAD and RCA 3. Widely patent left main 4. Severely elevated LVEDP 5. Moderate pulmonary HTN likely secondary to left heart failure (transpulmonic gradient 5-10 mmHg) 6. Severe MR by noninvasive assessment The circumflex/OM branches are borderline for PCI or grafting - targets appear 2 mm or less  Micro Data:  Blood 11/6 > ngtd Sputum 11/6 >  Urine 11/6 > neg MRSA PCR 11/6 >> neg  Antimicrobials:  Vanc 11/6 > 11/7 Cefepime 11/6 >   Subjective:  S/p TEE and LHC this am by cards in concern for ischemic MR r/o papillary muscle rupture Remains on fentanyl gtt 200 mcg for sedation. Objective:  Blood pressure 108/60, pulse 99, temperature 98.4 F (36.9 C),  temperature source Oral, resp. rate (!) 30, height 5\' 11"  (1.803 m), weight 89.1 kg, SpO2 100 %.    Vent Mode: PRVC FiO2 (%):  [50 %-70 %] 70 % Set Rate:  [18 bmp] 18 bmp Vt Set:  [600 mL] 600 mL PEEP:  [5 cmH20-8 cmH20] 8 cmH20 Plateau Pressure:  [20 cmH20-26 cmH20] 24 cmH20   Intake/Output Summary (Last 24 hours) at 10/07/2018 1021 Last data filed at 10/07/2018 0800 Gross per 24 hour  Intake 1391.59 ml  Output 1070 ml  Net 321.59 ml   Filed Weights   10/05/18 1600 10/06/18 0416 10/07/18 0500  Weight: 81.6 kg 88.5 kg 89.1 kg    Examination: General:  Critically ill adult male sedate on MV in NAD HEENT: MM pink/moist, pupils 3/reactive, ETT/ OGT Neuro: sedated on fentanyl, will open eyes briefly to command and f/c in all extremities CV: RR, no obvious murmur, +2 pulses PULM: even/non-labored on MV, lungs bilaterally clear anteriorly QQ:IWLN, bs +  Extremities: warm/dry, +1 LE edema Skin: no rashes   Assessment & Plan:   NSTEMI with new severe MR and systolic dysfunction  - s/p TEE and LHC this am given concern for ischemic MR on TTE yesterday.  No evidence of papillary muscle rupture on TEE -  LHC -> only disease found in circumflex which is not favorable for bypass; TCTS consulted by Cards- would consider a mitral clip if unable to wean from MV.   - heparin d/c by cards  Acute hypoxic respiratory failure  - CXR 11//8, slighly worse edema than yesterdays, stable  ETT - continue full MV support with attempts to wean FiO2 / peep as tolerated but high risk for pulm edema given severe MR - on my exam, attempted to decrease FiO2 from 70->60 on 8 peep and quickly desaturated to 88-90 therefore placed back on 70% - ongoing bronchial hygiene - trend CXR/ ABG as needed  - defer diuresis at this point given borderline bp  Possible HCAP  - Continue empiric cefepime for now, PCT remains elevated  - follow cultures - trend WBC/ fever curve   DM - SSI moderate/ CBG q 4 - Holding  preadmission invega, metformin, sitagliptin.  Mild AKI - stable UOP and sCR Hypocalcemia - pseduo, corrects 9.4 Hypo magnesium - resolved  - trend BMP/ UOP/ daily wt  Hx substance abuse (UDS on prior admits positive for cocaine), schizophrenia, bipolar, anxiety, depression. - Substance abuse counseling when able, unclear if family is aware  - UDS positive for cocaine  - Holding  preadmission gabapentin, sertraline, trazodone  Disposition / Summary of Today's Plan 10/07/18   Slow attempts to wean FiO2/ peep to see if tolerates given severe MR    Diet: start TF  Pain/Anxiety/Delirium protocol (if indicated): Fentanyl gtt / Midazolam PRN.  RASS goal 0 to -1. VAP protocol (if indicated): In place. DVT prophylaxis: SCD/ heparin sq GI prophylaxis: Pantoprazole Hyperglycemia protocol: SSI Mobility: Bedrest. Code Status: Full. Family Communication: Granddaughter updated this am by cards.  Friends visiting at bedside.   Labs   CBC: Recent Labs  Lab 10/05/18 1715 10/05/18 1743 10/06/18 0412 10/07/18 0304  WBC  --  17.5* 11.2* 8.9  NEUTROABS  --  12.3*  --   --   HGB 10.2* 10.2* 8.9* 7.7*  HCT 30.0* 32.5* 28.1* 24.9*  MCV  --  99.4 96.2 97.6  PLT  --  198 148* 096*   Basic Metabolic Panel: Recent Labs  Lab 10/05/18 1707 10/05/18 1715 10/06/18 0412 10/06/18 2018 10/07/18 0304 10/07/18 0312  NA 140 142 141  --   --  140  K 3.2* 3.6 3.7  --   --  3.8  CL 107 106 109  --   --  109  CO2 23  --  22  --   --  23  GLUCOSE 240* 239* 151*  --   --  138*  BUN 13 13 15   --   --  21  CREATININE 1.32* 1.20 1.30*  --   --  1.29*  CALCIUM 8.8*  --  8.4*  --   --  8.3*  MG  --   --  1.4* 2.7* 2.5*  --   PHOS  --   --  3.3  --   --  2.5   GFR: Estimated Creatinine Clearance: 64 mL/min (A) (by C-G formula based on SCr of 1.29 mg/dL (H)). Recent Labs  Lab 10/05/18 1743 10/05/18 2147 10/06/18 0412 10/07/18 0304  PROCALCITON  --  0.73 1.63 1.29  WBC 17.5*  --  11.2* 8.9   Liver  Function Tests: Recent Labs  Lab 10/07/18 0304 10/07/18 0312  AST 34  --   ALT 17  --   ALKPHOS 63  --   BILITOT 0.8  --   PROT 6.0*  --   ALBUMIN 2.6* 2.6*   No results for input(s): LIPASE, AMYLASE in the last 168 hours. No results for input(s): AMMONIA in the last 168 hours. ABG    Component Value Date/Time   PHART 7.393 10/06/2018 0414   PCO2ART  44.1 10/06/2018 0414   PO2ART 115.0 (H) 10/06/2018 0414   HCO3 26.7 10/06/2018 0414   TCO2 28 10/06/2018 0414   ACIDBASEDEF 1.0 10/05/2018 1951   O2SAT 98.0 10/06/2018 0414    Coagulation Profile: No results for input(s): INR, PROTIME in the last 168 hours. Cardiac Enzymes: Recent Labs  Lab 10/05/18 1707 10/05/18 2147 10/06/18 0412 10/06/18 1146  TROPONINI 5.34* 13.94* 26.21* 12.73*   HbA1C: Hemoglobin A1C  Date/Time Value Ref Range Status  06/15/2014 05:42 AM 6.5 (H) 4.2 - 6.3 % Final    Comment:    The American Diabetes Association recommends that a primary goal of therapy should be <7% and that physicians should reevaluate the treatment regimen in patients with HbA1c values consistently >8%.    Hgb A1c MFr Bld  Date/Time Value Ref Range Status  11/27/2017 02:03 PM 5.1 4.8 - 5.6 % Final    Comment:    (NOTE) Pre diabetes:          5.7%-6.4% Diabetes:              >6.4% Glycemic control for   <7.0% adults with diabetes   06/26/2017 04:39 AM 5.5 4.8 - 5.6 % Final    Comment:    (NOTE)         Pre-diabetes: 5.7 - 6.4         Diabetes: >6.4         Glycemic control for adults with diabetes: <7.0    CBG: Recent Labs  Lab 10/06/18 1250 10/06/18 1607 10/06/18 1954 10/06/18 2348 10/07/18 0405  GLUCAP 149* 120* 140* 150* 128*     Critical care time: 40 mins    Kennieth Rad, AGACNP-BC [facility] Pgr: [phone] or if no answer [phone] 10/07/2018, 10:21 AM

## 2018-10-07 NOTE — Interval H&P Note (Signed)
History and Physical Interval Note:  10/07/2018 8:57 AM  [name]  has presented today for surgery, with the diagnosis of ischemic MR  The various methods of treatment have been discussed with the patient and family. After consideration of risks, benefits and other options for treatment, the patient has consented to  Procedure(s): RIGHT/LEFT HEART CATH AND CORONARY ANGIOGRAPHY (N/A) as a surgical intervention .  The patient's history has been reviewed, patient examined, no change in status, stable for surgery.  I have reviewed the patient's chart and labs.  Questions were answered to the patient's satisfaction.     Sherren Mocha

## 2018-10-07 NOTE — Progress Notes (Signed)
Spoke with granddaughter [name] this morning regarding patient TEE consent. Ms. Zettie Pho gave verbal consent via phone call to myself with second verification for consent given to Danella Penton RN.

## 2018-10-08 ENCOUNTER — Inpatient Hospital Stay: Payer: Self-pay

## 2018-10-08 ENCOUNTER — Inpatient Hospital Stay (HOSPITAL_COMMUNITY): Payer: Medicare Other

## 2018-10-08 LAB — RENAL FUNCTION PANEL
ALBUMIN: 2.5 g/dL — AB (ref 3.5–5.0)
Anion gap: 5 (ref 5–15)
BUN: 25 mg/dL — ABNORMAL HIGH (ref 8–23)
CALCIUM: 8.7 mg/dL — AB (ref 8.9–10.3)
CO2: 26 mmol/L (ref 22–32)
Chloride: 112 mmol/L — ABNORMAL HIGH (ref 98–111)
Creatinine, Ser: 1.16 mg/dL (ref 0.61–1.24)
GFR calc Af Amer: >60 mL/min (ref >60)
GFR calc non Af Amer: >60 mL/min (ref >60)
GLUCOSE: 185 mg/dL — AB (ref 70–99)
PHOSPHORUS: 2.5 mg/dL (ref 2.5–4.6)
Potassium: 3.5 mmol/L (ref 3.5–5.1)
SODIUM: 143 mmol/L (ref 135–145)

## 2018-10-08 LAB — COOXEMETRY PANEL
CARBOXYHEMOGLOBIN: 2 % — AB (ref 0.5–1.5)
METHEMOGLOBIN: 1.8 % — AB (ref 0.0–1.5)
O2 Saturation: 78.5 %
Total hemoglobin: 9 g/dL — ABNORMAL LOW (ref 12.0–16.0)

## 2018-10-08 LAB — GLUCOSE, CAPILLARY
GLUCOSE-CAPILLARY: 170 mg/dL — AB (ref 70–99)
Glucose-Capillary: 130 mg/dL — ABNORMAL HIGH (ref 70–99)
Glucose-Capillary: 146 mg/dL — ABNORMAL HIGH (ref 70–99)
Glucose-Capillary: 177 mg/dL — ABNORMAL HIGH (ref 70–99)
Glucose-Capillary: 165 mg/dL — ABNORMAL HIGH (ref 70–99)
Glucose-Capillary: 160 mg/dL — ABNORMAL HIGH (ref 70–99)
Glucose-Capillary: 136 mg/dL — ABNORMAL HIGH (ref 70–99)

## 2018-10-08 LAB — CBC
HCT: 24.7 % — ABNORMAL LOW (ref 39.0–52.0)
Hemoglobin: 7.6 g/dL — ABNORMAL LOW (ref 13.0–17.0)
MCH: 30.3 pg (ref 26.0–34.0)
MCHC: 30.8 g/dL (ref 30.0–36.0)
MCV: 98.4 fL (ref 80.0–100.0)
Platelets: 127 10*3/uL — ABNORMAL LOW (ref 150–400)
RBC: 2.51 MIL/uL — ABNORMAL LOW (ref 4.22–5.81)
RDW: 12.2 % (ref 11.5–15.5)
WBC: 6.9 10*3/uL (ref 4.0–10.5)
nRBC: 0 % (ref 0.0–0.2)

## 2018-10-08 LAB — CULTURE, RESPIRATORY

## 2018-10-08 LAB — CULTURE, RESPIRATORY W GRAM STAIN

## 2018-10-08 LAB — MAGNESIUM: Magnesium: 2.1 mg/dL (ref 1.7–2.4)

## 2018-10-08 LAB — PREPARE RBC (CROSSMATCH)

## 2018-10-08 MED ORDER — SODIUM CHLORIDE 0.9 % IV SOLN
INTRAVENOUS | Status: DC | PRN
  Administered 2018-10-08: 1000 mL via INTRAVENOUS

## 2018-10-08 MED ORDER — POTASSIUM CHLORIDE 20 MEQ/15ML (10%) PO SOLN
40.0000 meq | Freq: Once | ORAL | Status: AC
  Administered 2018-10-08: 40 meq
  Filled 2018-10-08: qty 30

## 2018-10-08 MED ORDER — FUROSEMIDE 10 MG/ML IJ SOLN
80.0000 mg | Freq: Two times a day (BID) | INTRAMUSCULAR | Status: DC
  Administered 2018-10-08 – 2018-10-10 (×4): 80 mg via INTRAVENOUS
  Filled 2018-10-08 (×4): qty 8

## 2018-10-08 MED ORDER — RISPERIDONE 1 MG/ML PO SOLN
1.0000 mg | Freq: Every day | ORAL | Status: DC
  Administered 2018-10-08: 1 mg
  Filled 2018-10-08 (×2): qty 1

## 2018-10-08 MED ORDER — PALIPERIDONE ER 3 MG PO TB24
3.0000 mg | ORAL_TABLET | Freq: Every day | ORAL | Status: DC
  Filled 2018-10-08: qty 1

## 2018-10-08 MED ORDER — CHLORHEXIDINE GLUCONATE CLOTH 2 % EX PADS
6.0000 | MEDICATED_PAD | Freq: Every day | CUTANEOUS | Status: DC
  Administered 2018-10-08 – 2018-10-15 (×6): 6 via TOPICAL

## 2018-10-08 MED ORDER — TRAZODONE HCL 50 MG PO TABS
75.0000 mg | ORAL_TABLET | Freq: Every day | ORAL | Status: DC
  Administered 2018-10-08 – 2018-10-12 (×5): 75 mg
  Filled 2018-10-08 (×5): qty 2

## 2018-10-08 MED ORDER — SODIUM CHLORIDE 0.9% IV SOLUTION
Freq: Once | INTRAVENOUS | Status: AC
  Administered 2018-10-08: 14:00:00 via INTRAVENOUS

## 2018-10-08 MED ORDER — SPIRONOLACTONE 12.5 MG HALF TABLET
12.5000 mg | ORAL_TABLET | Freq: Every day | ORAL | Status: DC
  Administered 2018-10-08 – 2018-10-09 (×2): 12.5 mg via ORAL
  Filled 2018-10-08 (×2): qty 1

## 2018-10-08 MED ORDER — SODIUM CHLORIDE 0.9% FLUSH
10.0000 mL | Freq: Two times a day (BID) | INTRAVENOUS | Status: DC
  Administered 2018-10-08 – 2018-10-09 (×3): 10 mL
  Administered 2018-10-10: 20 mL
  Administered 2018-10-11 – 2018-10-13 (×5): 10 mL

## 2018-10-08 MED ORDER — SODIUM CHLORIDE 0.9% FLUSH: 10.0000 mL | INTRAVENOUS | Status: DC | PRN

## 2018-10-08 NOTE — Progress Notes (Signed)
Co-Ox was not obtained at ordered time due to PRBC infusing.  Will collect 1 hour after blood transfusion complete.

## 2018-10-08 NOTE — Progress Notes (Addendum)
62 year old man was admitted 11/6 with chest pain shortness of breath and severe hypoxia failing BiPAP requiring intubation in the ED Recent admission for 10 days in October for rhabdomyolysis and pericardial effusion UDS positive for cocaine on both occasions  Echo showed severe MR with several WMA and EF 40% TEE confirmed MR but no flail leaflet Underwent cardiac cath -severely diseased left circumflex but not amenable to PCI, highly elevated LVEDP  He had an episode of desaturation overnight, now on 70%/PEEP of 8. Intermittent agitation, RN reports moderate secretions were Sedated on fentanyl drip, bilateral lower lobe scattered crackles, rhonchi, no edema, S1-S2 normal, faint systolic murmur at apex.  Chest x-ray personally reviewed shows bilateral infiltrates and effusions, worse compared to 11 /7 Labs show drop in hemoglobin to 7.6, no leukocytosis, mild hypokalemia, other electrolytes okay, creatinine has improved to 1.1.  Impression/plan Acute systolic heart failure/severe MR/non-STEMI/obstructive CAD/cocaine use -Not amenable to PCI, does not need cardiac surgery. -Continue Lasix 40 every 12 for negative balance  Acute hypoxic respiratory failure-given secretions, will continue cefepime for total 5 days, respiratory culture negative -Ventilator settings reviewed and adjusted, keep PEEP 8-10 range but drop FiO2 to 50%.  Acute encephalopathy/agitation-has a history of schizophrenia -Last dose of paliperidone was on 10/21, will resume trazodone and antipsychotic -Goal RA SS -1 on fentanyl drip  Anemia of critical illness-transfuse 1 unit PRBC for goal hemoglobin 8  Guarded prognosis.  The patient is critically ill with multiple organ systems failure and requires high complexity decision making for assessment and support, frequent evaluation and titration of therapies, application of advanced monitoring technologies and extensive interpretation of multiple databases. Critical Care  Time devoted to patient care services described in this note independent of APP/resident  time is 32 minutes.   Leanna Sato Elsworth Soho MD [phone]

## 2018-10-08 NOTE — Progress Notes (Signed)
Peripherally Inserted Central Catheter/Midline Placement  The IV Nurse has discussed with the patient and/or persons authorized to consent for the patient, the purpose of this procedure and the potential benefits and risks involved with this procedure.  The benefits include less needle sticks, lab draws from the catheter, and the patient may be discharged home with the catheter. Risks include, but not limited to, infection, bleeding, blood clot (thrombus formation), and puncture of an artery; nerve damage and irregular heartbeat and possibility to perform a PICC exchange if needed/ordered by physician.  Alternatives to this procedure were also discussed.  Bard Power PICC patient education guide, fact sheet on infection prevention and patient information card has been provided to patient /or left at bedside.  Telephone consent obtained from dtr, Arlester Marker.  PICC/Midline Placement Documentation  PICC Triple Lumen 10/08/18 PICC Left Brachial 42 cm 0 cm (Active)  Indication for Insertion or Continuance of Line Vasoactive infusions;Prolonged intravenous therapies;Limited venous access - need for IV therapy >5 days (PICC only) 10/08/2018  3:39 PM  Exposed Catheter (cm) 0 cm 10/08/2018  3:39 PM  Site Assessment Clean;Dry;Intact 10/08/2018  3:39 PM  Lumen #1 Status Flushed;Saline locked;Blood return noted 10/08/2018  3:39 PM  Lumen #2 Status Flushed;Saline locked;Blood return noted 10/08/2018  3:39 PM  Lumen #3 Status Flushed;Saline locked;Blood return noted 10/08/2018  3:39 PM  Dressing Type Transparent 10/08/2018  3:39 PM  Dressing Status Clean;Dry;Intact;Antimicrobial disc in place 10/08/2018  3:39 PM  Line Care Connections checked and tightened 10/08/2018  3:39 PM  Line Adjustment (NICU/IV Team Only) No 10/08/2018  3:39 PM  Dressing Intervention New dressing 10/08/2018  3:39 PM  Dressing Change Due 10/15/18 10/08/2018  3:39 PM       Rolena Infante 10/08/2018, 3:39 PM

## 2018-10-08 NOTE — Progress Notes (Signed)
Informed consent obtained via telephone call to patient's daughter, Sahas Sluka at [phone] for blood and/or blood products.  Confirmed by this RN and Rachel Moulds, RN

## 2018-10-08 NOTE — Progress Notes (Addendum)
Advanced Heart Failure Consult Note   HPI:    62 y/o male with spinal stenosis, shizhophrenia, bipolar disorder, polysubstance abuse, PAD, h/o DVT, HCV s/p treatment with Harvoni, DM with diabetic neuropathy,recent rhabdo admitted on 11/6 with NSTEMI (Peak trop 26.2) and respiratory failure requiring intubation. We are asked to see by Dr. Roxy Manns for assistance with HF management.  Recent admission for 10 days in October for rhabdomyolysis due to cocaine use.   TTE on 11/7 EF 40-45% inferolateral AK mod-sev MR  (echo 09/07/18 with EF 60-65%)  TEE on 11/8 EF 35-40% severe MR no flail  Cath 11/8 images reviewed with Dr. Burt Knack  LM: ok LAD: 50%p LCx: 90% ostial diffusely diseased throughout RCA: 40% LVEDP = 32 RA = 12 RV = 52/13 PA =37/26 PCWP = 26 Fick 7.0/3.34  Seen by Dr. Roxy Manns and felt not to be candidate for CABG/MVR with only one vessel CAD in diffusely diseased LCX  Remains intubated/sedated on 50% FiO2. SBP in 90s. On IV lasix I/O -1.3L  Weight down 4 pounds.   ROS: unable to obtain as patient intubated    Objective:   Weight Range:  Vital Signs:   Temp:  [97.9 F (36.6 C)-100.4 F (38 C)] 98 F (36.7 C) (11/09 0900) Pulse Rate:  [75-129] 87 (11/09 0900) Resp:  [18-24] 18 (11/09 0900) BP: (89-141)/(52-76) 99/57 (11/09 0900) SpO2:  [90 %-100 %] 100 % (11/09 0900) FiO2 (%):  [50 %-100 %] 70 % (11/09 0803) Weight:  [87.4 kg] 87.4 kg (11/09 0400) Last BM Date: (PTA)  Weight change: Filed Weights   10/06/18 0416 10/07/18 0500 10/08/18 0400  Weight: 88.5 kg 89.1 kg 87.4 kg    Intake/Output:   Intake/Output Summary (Last 24 hours) at 10/08/2018 1059 Last data filed at 10/08/2018 0900 Gross per 24 hour  Intake 1219.76 ml  Output 2695 ml  Net -1475.24 ml     Physical Exam: General:  Intubated/sedated  HEENT: normal Neck: supple. JVP hard to see. Loks 8-9 . Carotids 2+ bilat; no bruits. No lymphadenopathy or thryomegaly appreciated. Cor: PMI nondisplaced.  Regular rate & rhythm. MR not heard well  Lungs: +rhonchi Abdomen: soft, nontender, nondistended. No hepatosplenomegaly. No bruits or masses. Good bowel sounds. Extremities: no cyanosis, clubbing, rash, 1+ edema sores on RLE Neuro:intubated sedated  Telemetry: NSR 80s Personally reviewed  Social History   Socioeconomic History  . Marital status: Single    Spouse name: Not on file  . Number of children: Not on file  . Years of education: Not on file  . Highest education level: Not on file  Occupational History  . Occupation: disabled  Social Needs  . Financial resource strain: Not on file  . Food insecurity:    Worry: Not on file    Inability: Not on file  . Transportation needs:    Medical: Not on file    Non-medical: Not on file  Tobacco Use  . Smoking status: Current Every Day Smoker    Packs/day: 1.00    Years: 40.00    Pack years: 40.00    Types: Cigarettes  . Smokeless tobacco: Never Used  Substance and Sexual Activity  . Alcohol use: Yes    Comment: occasional use  . Drug use: Yes    Types: Cocaine, Marijuana  . Sexual activity: Yes  Lifestyle  . Physical activity:    Days per week: Not on file    Minutes per session: Not on file  . Stress: Not on file  Relationships  . Social connections:    Talks on phone: Not on file    Gets together: Not on file    Attends religious service: Not on file    Active member of club or organization: Not on file    Attends meetings of clubs or organizations: Not on file    Relationship status: Not on file  Other Topics Concern  . Not on file  Social History Narrative  . Not on file   Family History  Problem Relation Age of Onset  . Heart attack Father   . Anesthesia problems Neg Hx   . Hypotension Neg Hx   . Malignant hyperthermia Neg Hx   . Pseudochol deficiency Neg Hx       Labs: Basic Metabolic Panel: Recent Labs  Lab 10/05/18 1707 10/05/18 1715 10/06/18 0412 10/06/18 2018 10/07/18 0304 10/07/18 0312  10/08/18 0607  NA 140 142 141  --   --  140 143  K 3.2* 3.6 3.7  --   --  3.8 3.5  CL 107 106 109  --   --  109 112*  CO2 23  --  22  --   --  23 26  GLUCOSE 240* 239* 151*  --   --  138* 185*  BUN _0 --   --  21 25*  CREATININE 1.32* 1.20 1.30*  --   --  1.29* 1.16  CALCIUM 8.8*  --  8.4*  --   --  8.3* 8.7*  MG  --   --  1.4* 2.7* 2.5*  --  2.1  PHOS  --   --  3.3  --   --  2.5 2.5    Liver Function Tests: Recent Labs  Lab 10/07/18 0304 10/07/18 0312 10/08/18 0607  AST 34  --   --   ALT 17  --   --   ALKPHOS 63  --   --   BILITOT 0.8  --   --   PROT 6.0*  --   --   ALBUMIN 2.6* 2.6* 2.5*   No results for input(s): LIPASE, AMYLASE in the last 168 hours. No results for input(s): AMMONIA in the last 168 hours.  CBC: Recent Labs  Lab 10/05/18 1715 10/05/18 1743 10/06/18 0412 10/07/18 0304 10/08/18 0607  WBC  --  17.5* 11.2* 8.9 6.9  NEUTROABS  --  12.3*  --   --   --   HGB 10.2* 10.2* 8.9* 7.7* 7.6*  HCT 30.0* 32.5* 28.1* 24.9* 24.7*  MCV  --  99.4 96.2 97.6 98.4  PLT  --  198 148* 126* 127*    Cardiac Enzymes: Recent Labs  Lab 10/05/18 1707 10/05/18 2147 10/06/18 0412 10/06/18 1146  TROPONINI 5.34* 13.94* 26.21* 12.73*    BNP: BNP (last 3 results) Recent Labs    10/05/18 1436  BNP 779.2*    ProBNP (last 3 results) No results for input(s): PROBNP in the last 8760 hours.    Other results:  Imaging: Dg Chest Port 1 View  Result Date: 10/08/2018 CLINICAL DATA:  Respiratory failure. EXAM: PORTABLE CHEST 1 VIEW COMPARISON:  10/07/2018 FINDINGS: Endotracheal tube is 5.8 cm above the carina. Nasogastric tube extends into the abdomen but the tip is beyond the image. Bilateral airspace disease has not significantly changed. Cardiac silhouette is within normal limits and stable. The retrocardiac space is obscured by airspace disease or consolidation. Negative for a pneumothorax. Surgical plate in the lower cervical spine. Evidence for  right pleural  fluid. IMPRESSION: 1. Stable bilateral airspace disease. Basilar chest densities are suggestive for pleural fluid and consolidation. 2. Support apparatuses as described. Electronically Signed   By: Markus Daft M.D.   On: 10/08/2018 08:57   Dg Chest Port 1 View  Result Date: 10/07/2018 CLINICAL DATA:  ETT assessment.  Respiratory failure. EXAM: PORTABLE CHEST 1 VIEW COMPARISON:  10/06/2018 FINDINGS: Endotracheal tube unchanged. Enteric tube has tip in the region of the stomach and side-port in the region of the expected gastroesophageal junction unchanged. Right-sided VP shunt unchanged. Lungs are adequately inflated demonstrate slight worsening hazy bilateral airspace opacification over the central and lower lungs which may be due to worsening interstitial edema or infection. Worse hazy bibasilar opacification likely bilateral effusions. Cardiomediastinal silhouette and remainder the exam is unchanged. IMPRESSION: Worsening hazy bibasilar opacification likely bilateral pleural effusions. Slight worsening hazy airspace process over the central and lower lungs likely interstitial edema, although infection is possible. Tubes and lines unchanged. Electronically Signed   By: Marin Olp M.D.   On: 10/07/2018 09:34      Medications:     Scheduled Medications: . sodium chloride   Intravenous Once  . aspirin  325 mg Oral Daily  . benztropine  1 mg Oral BID  . chlorhexidine gluconate (MEDLINE KIT)  15 mL Mouth Rinse BID  . feeding supplement (PRO-STAT SUGAR FREE 64)  30 mL Per Tube BID  . furosemide  40 mg Intravenous Q12H  . heparin  5,000 Units Subcutaneous Q8H  . insulin aspart  0-15 Units Subcutaneous Q4H  . ipratropium-albuterol  3 mL Nebulization Once  . mouth rinse  15 mL Mouth Rinse 10 times per day  . paliperidone  3 mg Oral Daily  . pantoprazole (PROTONIX) IV  40 mg Intravenous QHS  . potassium chloride  40 mEq Per Tube Once  . sodium chloride flush  3 mL Intravenous Q12H  . traZODone   75 mg Per Tube QHS     Infusions: . sodium chloride Stopped (10/07/18 1054)  . ceFEPime (MAXIPIME) IV 2 g (10/08/18 0917)  . feeding supplement (VITAL 1.5 CAL) 1,000 mL (10/08/18 0031)  . fentaNYL infusion INTRAVENOUS 250 mcg/hr (10/08/18 0900)     PRN Medications:  sodium chloride, fentaNYL, midazolam, midazolam, sodium chloride flush   Assessment:   62 y/o male with spinal stenosis, shizhophrenia, bipolar disorder, polysubstance abuse, PAD, h/o DVT, HCV s/p treatment with Harvoni, DM with diabetic neuropathy,recent rhabdo admitted on 11/6 with NSTEMI (Peak trop 26.2) and respiratory failure requiring intubation. We are asked to see by Dr. Roxy Manns for assistance with HF management.  Plan/Discussion:    1. NSTEMI/CAD - cath results as above with diffuse 1v CAD in moderate-sized LCX. Not good target for PCI/CABG - manage medically with ASA/statin - no b-blocker with respiratory failure  2. Acute systolic HF with severe MR - EF ~ 40% by Echo with severe ischemic MR due to LCX infarct. No evidence of flai; - filling pressures high on cath with normal CO - continue IV diuresis. Increase IV lasix to 80 bid. Start spiro - no b-blocker or ARB with low BP and acute HF - no candidate for surgery or advanced therapies - doubt mitraClip an option - Place PICC to follow CVP and co-ox  3. Ischemic MR - as above  4. Acute hypoxic respiratory failure - CCM following - Continue diuresis  - PCT 1.6. On cefipime for probable aspiration on CXR  5. Polysubstance abuse - longstanding. CCM managing  6. Schizophrenia - per CCM  Continue aggressive medical care. With social hx and ongoing polysubstance abuse not candidate for any advanced HF therapies. The AHF team will f/u in CCU and then General Cardiology can follow once out of ICU.   CRITICAL CARE Performed by: Glori Bickers  Total critical care time: 45 minutes  Critical care time was exclusive of separately billable  procedures and treating other patients.  Critical care was necessary to treat or prevent imminent or life-threatening deterioration.  Critical care was time spent personally by me (independent of midlevel providers or residents) on the following activities: development of treatment plan with patient and/or surrogate as well as nursing, discussions with consultants, evaluation of patient's response to treatment, examination of patient, obtaining history from patient or surrogate, ordering and performing treatments and interventions, ordering and review of laboratory studies, ordering and review of radiographic studies, pulse oximetry and re-evaluation of patient's condition.     Length of Stay: 3   Glori Bickers MD 10/08/2018, 10:59 AM  Advanced Heart Failure Team Pager [phone] (M-F; [place])  Please contact [facility] for night-coverage after hours (4p -7a ) and weekends on [email or web address]

## 2018-10-09 ENCOUNTER — Inpatient Hospital Stay (HOSPITAL_COMMUNITY): Payer: Medicare Other

## 2018-10-09 LAB — BASIC METABOLIC PANEL
Anion gap: 6 (ref 5–15)
Anion gap: 6 (ref 5–15)
BUN: 26 mg/dL — AB (ref 8–23)
BUN: 24 mg/dL — AB (ref 8–23)
CO2: 28 mmol/L (ref 22–32)
CO2: 28 mmol/L (ref 22–32)
CREATININE: 1.1 mg/dL (ref 0.61–1.24)
Calcium: 8.6 mg/dL — ABNORMAL LOW (ref 8.9–10.3)
Calcium: 8.8 mg/dL — ABNORMAL LOW (ref 8.9–10.3)
Chloride: 111 mmol/L (ref 98–111)
Chloride: 112 mmol/L — ABNORMAL HIGH (ref 98–111)
Creatinine, Ser: 1.08 mg/dL (ref 0.61–1.24)
GFR calc Af Amer: >60 mL/min (ref >60)
GFR calc Af Amer: >60 mL/min (ref >60)
GLUCOSE: 199 mg/dL — AB (ref 70–99)
GLUCOSE: 162 mg/dL — AB (ref 70–99)
POTASSIUM: 3.7 mmol/L (ref 3.5–5.1)
Potassium: 3.4 mmol/L — ABNORMAL LOW (ref 3.5–5.1)
SODIUM: 145 mmol/L (ref 135–145)
Sodium: 146 mmol/L — ABNORMAL HIGH (ref 135–145)

## 2018-10-09 LAB — TYPE AND SCREEN
ABO/RH(D): B POS
Antibody Screen: NEGATIVE
Unit division: 0

## 2018-10-09 LAB — GLUCOSE, CAPILLARY
GLUCOSE-CAPILLARY: 169 mg/dL — AB (ref 70–99)
GLUCOSE-CAPILLARY: 172 mg/dL — AB (ref 70–99)
GLUCOSE-CAPILLARY: 183 mg/dL — AB (ref 70–99)
GLUCOSE-CAPILLARY: 188 mg/dL — AB (ref 70–99)
Glucose-Capillary: 144 mg/dL — ABNORMAL HIGH (ref 70–99)
Glucose-Capillary: 163 mg/dL — ABNORMAL HIGH (ref 70–99)
Glucose-Capillary: 193 mg/dL — ABNORMAL HIGH (ref 70–99)

## 2018-10-09 LAB — CBC
HCT: 27.5 % — ABNORMAL LOW (ref 39.0–52.0)
Hemoglobin: 8.6 g/dL — ABNORMAL LOW (ref 13.0–17.0)
MCH: 30.6 pg (ref 26.0–34.0)
MCHC: 31.3 g/dL (ref 30.0–36.0)
MCV: 97.9 fL (ref 80.0–100.0)
PLATELETS: 146 10*3/uL — AB (ref 150–400)
RBC: 2.81 MIL/uL — ABNORMAL LOW (ref 4.22–5.81)
RDW: 13.2 % (ref 11.5–15.5)
WBC: 9.2 10*3/uL (ref 4.0–10.5)
nRBC: 0.2 % (ref 0.0–0.2)

## 2018-10-09 LAB — BPAM RBC
Blood Product Expiration Date: 201911232359
ISSUE DATE / TIME: 201911091421
Unit Type and Rh: 7300

## 2018-10-09 LAB — COOXEMETRY PANEL
CARBOXYHEMOGLOBIN: 1.9 % — AB (ref 0.5–1.5)
Methemoglobin: 1.1 % (ref 0.0–1.5)
O2 Saturation: 78.7 %
Total hemoglobin: 8.6 g/dL — ABNORMAL LOW (ref 12.0–16.0)

## 2018-10-09 LAB — PHOSPHORUS: PHOSPHORUS: 2.8 mg/dL (ref 2.5–4.6)

## 2018-10-09 LAB — MAGNESIUM: MAGNESIUM: 2 mg/dL (ref 1.7–2.4)

## 2018-10-09 MED ORDER — ACETAMINOPHEN 160 MG/5ML PO SOLN
650.0000 mg | ORAL | Status: DC | PRN
  Administered 2018-10-11 – 2018-10-12 (×3): 650 mg
  Filled 2018-10-09 (×3): qty 20.3

## 2018-10-09 MED ORDER — BENZTROPINE MESYLATE 1 MG PO TABS
1.0000 mg | ORAL_TABLET | Freq: Two times a day (BID) | ORAL | Status: DC
  Administered 2018-10-09 – 2018-10-12 (×8): 1 mg
  Filled 2018-10-09 (×8): qty 1

## 2018-10-09 MED ORDER — ATORVASTATIN CALCIUM 40 MG PO TABS
40.0000 mg | ORAL_TABLET | Freq: Every day | ORAL | Status: DC
  Administered 2018-10-09: 40 mg via ORAL

## 2018-10-09 MED ORDER — DOCUSATE SODIUM 50 MG/5ML PO LIQD
100.0000 mg | Freq: Two times a day (BID) | ORAL | Status: DC
  Administered 2018-10-09 – 2018-10-11 (×6): 100 mg
  Filled 2018-10-09 (×6): qty 10

## 2018-10-09 MED ORDER — ISOSORBIDE DINITRATE 10 MG PO TABS
10.0000 mg | ORAL_TABLET | Freq: Two times a day (BID) | ORAL | Status: DC
  Administered 2018-10-09 – 2018-10-11 (×6): 10 mg
  Filled 2018-10-09 (×6): qty 1

## 2018-10-09 MED ORDER — BISACODYL 10 MG RE SUPP: 10.0000 mg | Freq: Every day | RECTAL | Status: DC | PRN

## 2018-10-09 MED ORDER — HYDRALAZINE HCL 10 MG PO TABS
10.0000 mg | ORAL_TABLET | Freq: Three times a day (TID) | ORAL | Status: DC
  Administered 2018-10-09 – 2018-10-10 (×3): 10 mg via ORAL
  Filled 2018-10-09 (×3): qty 1

## 2018-10-09 MED ORDER — ISOSORBIDE MONONITRATE ER 30 MG PO TB24: 30.0000 mg | ORAL_TABLET | Freq: Every day | ORAL | Status: DC

## 2018-10-09 MED ORDER — RISPERIDONE 1 MG/ML PO SOLN
1.0000 mg | Freq: Two times a day (BID) | ORAL | Status: DC
  Administered 2018-10-09 – 2018-10-12 (×8): 1 mg
  Filled 2018-10-09 (×9): qty 1

## 2018-10-09 MED ORDER — POTASSIUM CHLORIDE 20 MEQ/15ML (10%) PO SOLN
20.0000 meq | ORAL | Status: AC
  Administered 2018-10-09 (×2): 20 meq
  Filled 2018-10-09 (×2): qty 15

## 2018-10-09 NOTE — Plan of Care (Signed)
  Problem: Clinical Measurements: Goal: Ability to maintain clinical measurements within normal limits will improve Outcome: Progressing Goal: Will remain free from infection Outcome: Progressing   Problem: Nutrition: Goal: Adequate nutrition will be maintained Outcome: Progressing   Problem: Activity: Goal: Risk for activity intolerance will decrease Outcome: Not Progressing Note:  Pt sedated and intubated    Problem: Role Relationship: Goal: Method of communication will improve Outcome: Not Progressing Note:  Pt intubated

## 2018-10-09 NOTE — Progress Notes (Signed)
NAME:  [name], MRN:  [id number], DOB:  16-Jan-1956, LOS: 4 ADMISSION DATE:  10/05/2018, CONSULTATION DATE:  10/05/18 REFERRING MD:  Darl Householder CHIEF COMPLAINT:  SOB   Brief History   [name] is a 62 y.o. male who was admitted 11/6 with NSTEMI and acute hypoxic respiratory failure requiring intubation.  Past Medical History  Spinal stenosis, shizhophrenia, bipolar disorder, depression, anxiety, polysubstance abuse, rhabdo, PVD, DVT, HCV s/p treatment with Harvoni, diabetic neuropathy, DM.  Significant Hospital Events   11/6 > admit. 11/8 > TEE/ LHC  Consults: date of consult/date signed off & final recs:  Cardiology 11/6 >   Procedures (surgical and bedside):  ETT 11/6 >   Significant Diagnostic Tests:  CXR 11/6 > cardiomegaly, small b/l effusions, b/l GGO's. Echo 11/7 > Reduced LV EF 40-45% (down from 60-65% on 08/2018). Appears mildly hypokinetic globally, with moderate hypokinesis of septal/inferior/lateral walls. Grade 3 diastolic dysfunction. Moderate to severe MR, moderate TR.  LE duplex 11/7 > neg  TEE 11/8  >> prelim- > severe MR, no papillary muscle rupture noted  LHC 11/8 >>  1. Severe stenosis of the left circumflex - small diffusely diseased vessels 2. Diffuse nonobstructive stenosis of the LAD and RCA 3. Widely patent left main 4. Severely elevated LVEDP 5. Moderate pulmonary HTN likely secondary to left heart failure (transpulmonic gradient 5-10 mmHg) 6. Severe MR by noninvasive assessment The circumflex/OM branches are borderline for PCI or grafting - targets appear 2 mm or less  Micro Data:  Blood 11/6 > ngtd Sputum 11/6 >  ng Urine 11/6 > neg MRSA PCR 11/6 >> neg  Antimicrobials:  Vanc 11/6 > 11/7 Cefepime 11/6 >   Subjective:   Afebrile Critically ill , intubated on PEEP 10, deatn overnight, FIO2 up to 70% diuresed well with lasix   Objective:  Blood pressure (!) 103/55, pulse (!) 116, temperature 98.5 F (36.9 C), temperature source  Oral, resp. rate (!) 21, height 5\' 11"  (1.803 m), weight 86.6 kg, SpO2 91 %. CVP:  [6 mmHg-14 mmHg] 12 mmHg  Vent Mode: PRVC FiO2 (%):  [50 %-100 %] 50 % Set Rate:  [18 bmp] 18 bmp Vt Set:  [600 mL] 600 mL PEEP:  [5 cmH20-10 cmH20] 10 cmH20 Plateau Pressure:  [23 cmH20-26 cmH20] 24 cmH20   Intake/Output Summary (Last 24 hours) at 10/09/2018 1456 Last data filed at 10/09/2018 1400 Gross per 24 hour  Intake 2764.04 ml  Output 4390 ml  Net -1625.96 ml   Filed Weights   10/07/18 0500 10/08/18 0400 10/09/18 0500  Weight: 89.1 kg 87.4 kg 86.6 kg    Examination: General:  Critically ill adult male sedate on MV in NAD Mild pallor, no icterus, no JVD BL basal crackles, no rhonchi Faint PSM at apex , s1s2 nml Follows commands , RASS 0 on fent gtt Soft, non tender abdomen No edema,   pCXR - BL effusions, ASD , reviewed  Assessment & Plan:   NSTEMI with new severe MR and acute systolic HF - No evidence of papillary muscle rupture on TEE, so medical Mx -  Not amenable to PCI  would consider a mitral clip if unable to wean from MV.   -  Ct lasix 40 q 12 for neg balance -afterload reduction per cards  Acute hypoxic respiratory failure  - recurrent desatn every time PEEP is lowered last few days -  Drop FIO2, keep PEEP @10   Possible HCAP  - Continue empiric cefepime for now, PCT remains  elevated  -cultures neg -plan 5 ds  DM - SSI moderate/ CBG q 4 - Holding preadmission  metformin, sitagliptin.  Mild AKI - stable UOP and sCR Hypocalcemia - pseduo, corrects 9.4 Hypo magnesium - resolved  - trend BMP/ UOP/ daily wt  Hx substance abuse (UDS on prior admits positive for cocaine), schizophrenia, bipolar, anxiety, depression. - Substance abuse counseling when able, grand daughter was not aware  - UDS positive for cocaine  - Holding  preadmission gabapentin, sertraline,  -resumed trazodone & risperdal instead of invega (got last shot 10/21)  Disposition / Summary of Today's  Plan 10/09/18    Does not tolerate lowering of PEEP , Difficult situation, hoping MR will temporise with medical optimisation - no surgical options here     Code Status: Full. Family Communication: Granddaughter updated 11/9  The patient is critically ill with multiple organ systems failure and requires high complexity decision making for assessment and support, frequent evaluation and titration of therapies, application of advanced monitoring technologies and extensive interpretation of multiple databases. Critical Care Time devoted to patient care services described in this note independent of APP/resident  time is 32 minutes.   Kara Mead MD. [place] Flood. [facility] Pager [phone] If no response call [phone]    10/09/2018, 2:56 PM

## 2018-10-09 NOTE — Progress Notes (Addendum)
Patient ID: [name], male   DOB: 1956/01/21, 62 y.o.   MRN: [id number]     Advanced Heart Failure Rounding Note  PCP-Cardiologist: No primary care provider on file.   Subjective:    Patient remains intubated, have not been successful weaning vent.  Co-ox 78% today with CVP 11-12.  He diuresed well yesterday, weight down about 2 lbs.  Creatinine stable.   LHC/RHC:  Severe LCx system disease RA mean 12 PA 52/25 PCWP mean 27 LVEDP 32 CI 3.34  TEE: EF 35-40%, inf HK, severe central MR with no papillary muscle rupture or primary MV disease.   CXR: Bilateral airspace opacities   Objective:   Weight Range: 86.6 kg Body mass index is 26.63 kg/m.   Vital Signs:   Temp:  [97.3 F (36.3 C)-99.8 F (37.7 C)] 99.8 F (37.7 C) (11/10 0800) Pulse Rate:  [72-118] 76 (11/10 0800) Resp:  [17-19] 18 (11/10 0800) BP: (86-127)/(43-81) 95/49 (11/10 0800) SpO2:  [84 %-100 %] 100 % (11/10 0800) FiO2 (%):  [50 %-100 %] 60 % (11/10 0800) Weight:  [86.6 kg] 86.6 kg (11/10 0500) Last BM Date: (PTA)  Weight change: Filed Weights   10/07/18 0500 10/08/18 0400 10/09/18 0500  Weight: 89.1 kg 87.4 kg 86.6 kg    Intake/Output:   Intake/Output Summary (Last 24 hours) at 10/09/2018 0859 Last data filed at 10/09/2018 0821 Gross per 24 hour  Intake 2851.07 ml  Output 3700 ml  Net -848.93 ml      Physical Exam    General:  Intubated, awakens HEENT: Normal Neck: Supple. JVP 10 cm. Carotids 2+ bilat; no bruits. No lymphadenopathy or thyromegaly appreciated. Cor: PMI nondisplaced. Regular rate & rhythm. 3/6 HSM apex.  Lungs: Decreased BS at bases.  Abdomen: Soft, nontender, nondistended. No hepatosplenomegaly. No bruits or masses. Good bowel sounds. Extremities: No cyanosis, clubbing, rash, edema Neuro: Will follow commands.    Telemetry   NSR (personally reviewed)   Labs    CBC Recent Labs    10/08/18 0607 10/09/18 0204  WBC 6.9 9.2  HGB 7.6* 8.6*  HCT 24.7* 27.5*    MCV 98.4 97.9  PLT 127* 322*   Basic Metabolic Panel Recent Labs    10/08/18 0607 10/09/18 0204 10/09/18 0507  NA 143 145 146*  K 3.5 3.4* 3.7  CL 112* 111 112*  CO2 _0 GLUCOSE 185* 199* 162*  BUN 25* 26* 24*  CREATININE 1.16 1.10 1.08  CALCIUM 8.7* 8.6* 8.8*  MG 2.1 2.0  --   PHOS 2.5 2.8  --    Liver Function Tests Recent Labs    10/07/18 0304 10/07/18 0312 10/08/18 0607  AST 34  --   --   ALT 17  --   --   ALKPHOS 63  --   --   BILITOT 0.8  --   --   PROT 6.0*  --   --   ALBUMIN 2.6* 2.6* 2.5*   No results for input(s): LIPASE, AMYLASE in the last 72 hours. Cardiac Enzymes Recent Labs    10/06/18 1146  TROPONINI 12.73*    BNP: BNP (last 3 results) Recent Labs    10/05/18 1436  BNP 779.2*    ProBNP (last 3 results) No results for input(s): PROBNP in the last 8760 hours.   D-Dimer No results for input(s): DDIMER in the last 72 hours. Hemoglobin A1C No results for input(s): HGBA1C in the last 72 hours. Fasting Lipid Panel No results for input(s):  CHOL, HDL, LDLCALC, TRIG, CHOLHDL, LDLDIRECT in the last 72 hours. Thyroid Function Tests No results for input(s): TSH, T4TOTAL, T3FREE, THYROIDAB in the last 72 hours.  Invalid input(s): FREET3  Other results:   Imaging    Dg Chest Port 1 View  Result Date: 10/09/2018 CLINICAL DATA:  Acute respiratory failure. EXAM: PORTABLE CHEST 1 VIEW COMPARISON:  10/08/2018 and older studies. FINDINGS: Hazy bilateral airspace lung opacities are without significant change from the previous day's study. More confluent opacity at the lung bases is consistent with bilateral pleural effusions. This is also stable. No new lung abnormalities.  No pneumothorax. New left PICC, catheter tip in the lower superior vena cava. Stable endotracheal tube and nasal/orogastric tube. IMPRESSION: 1. No change in lung aeration from the previous day's study with persistent bilateral hazy airspace lung opacities and bilateral  pleural effusions. 2. New left PICC is well positioned. Endotracheal tube and nasal/orogastric tube are stable. Electronically Signed   By: Lajean Manes M.D.   On: 10/09/2018 08:11   [place] Ekg Site Rite  Result Date: 10/08/2018 If Site Rite image not attached, placement could not be confirmed due to current cardiac rhythm.     Medications:     Scheduled Medications: . aspirin  325 mg Oral Daily  . atorvastatin  40 mg Oral q1800  . benztropine  1 mg Per Tube BID  . chlorhexidine gluconate (MEDLINE KIT)  15 mL Mouth Rinse BID  . Chlorhexidine Gluconate Cloth  6 each Topical Daily  . docusate  100 mg Per Tube BID  . feeding supplement (PRO-STAT SUGAR FREE 64)  30 mL Per Tube BID  . furosemide  80 mg Intravenous Q12H  . heparin  5,000 Units Subcutaneous Q8H  . insulin aspart  0-15 Units Subcutaneous Q4H  . ipratropium-albuterol  3 mL Nebulization Once  . mouth rinse  15 mL Mouth Rinse 10 times per day  . pantoprazole (PROTONIX) IV  40 mg Intravenous QHS  . risperiDONE  1 mg Per Tube BID  . sodium chloride flush  10-40 mL Intracatheter Q12H  . sodium chloride flush  3 mL Intravenous Q12H  . spironolactone  12.5 mg Oral Daily  . traZODone  75 mg Per Tube QHS     Infusions: . sodium chloride Stopped (10/07/18 1054)  . sodium chloride Stopped (10/09/18 0002)  . ceFEPime (MAXIPIME) IV Stopped (10/08/18 2305)  . feeding supplement (VITAL 1.5 CAL) 1,000 mL (10/08/18 1817)  . fentaNYL infusion INTRAVENOUS 250 mcg/hr (10/09/18 0854)     PRN Medications:  sodium chloride, sodium chloride, acetaminophen (TYLENOL) oral liquid 160 mg/5 mL, bisacodyl, fentaNYL, midazolam, sodium chloride flush, sodium chloride flush    Patient Profile   62 y/o male with spinal stenosis, schizophrenia, bipolar disorder, polysubstance abuse, PAD, h/o DVT, HCV s/p treatment with Harvoni, DM with diabetic neuropathy,recent rhabdo admitted on 11/6 with NSTEMI (Peak trop 26.2) and respiratory failure  requiring intubation.  Assessment/Plan   1. CAD: NSTEMI.  Severe diffuse disease in LCx system not amenable to PCI or CABG.  Medical management.  - Continue ASA 81, atorvastatin 80 daily.  2. Acute systolic CHF: TEE with EF 35-40% and severe central mitral regurgitation.  High filling pressures on RHC with preserved cardiac output.  Co-ox 78% today with CVP 11-12.  He diuresed well with IV Lasix yesterday.  CXR with bilateral airspace disease.  - Continue Lasix 80 mg IV bid, he is diuresing well so far but still with elevated CVP.  - Severe MR likely  plays a role in CHF/respiratory failure.  SBP in 90s, will try very low dose hydralazine/nitrates to see if we can get some afterload reduction begun. - Good co-ox, hold off on inotrope.  3. Acute hypoxemic respiratory failure: Suspect pulmonary edema +/- aspiration.  - Cefepime to cover aspiration.  - Diuresis.  4. Polysubstance abuse: Long-standing.  5. Mitral regurgitation: Secondary, infarct-related (LCx territory). No papillary muscle rupture.  At this point, plan medical management of acute CHF.  Not surgical candidate.  Mitraclip may be consideration down the road but probable pure secondary MR makes difficult at this time.  6. Schizophrenia.   CRITICAL CARE Performed by: Loralie Champagne  Total critical care time: 35 minutes  Critical care time was exclusive of separately billable procedures and treating other patients.  Critical care was necessary to treat or prevent imminent or life-threatening deterioration.  Critical care was time spent personally by me on the following activities: development of treatment plan with patient and/or surrogate as well as nursing, discussions with consultants, evaluation of patient's response to treatment, examination of patient, obtaining history from patient or surrogate, ordering and performing treatments and interventions, ordering and review of laboratory studies, ordering and review of radiographic  studies, pulse oximetry and re-evaluation of patient's condition.  Length of Stay: 4  Loralie Champagne, MD  10/09/2018, 8:59 AM  Advanced Heart Failure Team Pager [phone] (M-F; 7a - 4p)  Please contact [facility] for night-coverage after hours (4p -7a ) and weekends on [email or web address]

## 2018-10-09 NOTE — Progress Notes (Signed)
..  [facility] ADULT ICU REPLACEMENT PROTOCOL FOR AM LAB REPLACEMENT ONLY  The patient does apply for the [facility] Adult ICU Electrolyte Replacment Protocol based on the criteria listed below:   1. Is GFR >/= 40 ml/min? Yes.    Patient's GFR today is >60 2. Is urine output >/= 0.5 ml/kg/hr for the last 6 hours? Yes.   Patient's UOP is 3.6 ml/kg/hr 3. Is BUN < 60 mg/dL? Yes.    Patient's BUN today is 26 4. Abnormal electrolyte(s): K+ 3.4 5. Ordered repletion with: protocol 6. If a panic level lab has been reported, has the CCM MD in charge been notified? No..   Physician:  Dr.Sommer  Carlisle Beers 10/09/2018 3:10 AM

## 2018-10-10 ENCOUNTER — Inpatient Hospital Stay (HOSPITAL_COMMUNITY): Payer: Medicare Other

## 2018-10-10 LAB — COOXEMETRY PANEL
Carboxyhemoglobin: 1.7 % — ABNORMAL HIGH (ref 0.5–1.5)
Methemoglobin: 1.8 % — ABNORMAL HIGH (ref 0.0–1.5)
O2 Saturation: 69.4 %
Total hemoglobin: 8 g/dL — ABNORMAL LOW (ref 12.0–16.0)

## 2018-10-10 LAB — MAGNESIUM: MAGNESIUM: 2 mg/dL (ref 1.7–2.4)

## 2018-10-10 LAB — GLUCOSE, CAPILLARY
GLUCOSE-CAPILLARY: 178 mg/dL — AB (ref 70–99)
GLUCOSE-CAPILLARY: 178 mg/dL — AB (ref 70–99)
GLUCOSE-CAPILLARY: 250 mg/dL — AB (ref 70–99)
Glucose-Capillary: 209 mg/dL — ABNORMAL HIGH (ref 70–99)
Glucose-Capillary: 217 mg/dL — ABNORMAL HIGH (ref 70–99)

## 2018-10-10 LAB — CBC
HCT: 26.7 % — ABNORMAL LOW (ref 39.0–52.0)
Hemoglobin: 8.3 g/dL — ABNORMAL LOW (ref 13.0–17.0)
MCH: 30.5 pg (ref 26.0–34.0)
MCHC: 31.1 g/dL (ref 30.0–36.0)
MCV: 98.2 fL (ref 80.0–100.0)
PLATELETS: 151 10*3/uL (ref 150–400)
RBC: 2.72 MIL/uL — ABNORMAL LOW (ref 4.22–5.81)
RDW: 13 % (ref 11.5–15.5)
WBC: 8.6 10*3/uL (ref 4.0–10.5)
nRBC: 0 % (ref 0.0–0.2)

## 2018-10-10 LAB — BASIC METABOLIC PANEL
Anion gap: 7 (ref 5–15)
BUN: 24 mg/dL — AB (ref 8–23)
CO2: 31 mmol/L (ref 22–32)
CREATININE: 1.06 mg/dL (ref 0.61–1.24)
Calcium: 8.9 mg/dL (ref 8.9–10.3)
Chloride: 107 mmol/L (ref 98–111)
GFR calc Af Amer: >60 mL/min (ref >60)
Glucose, Bld: 183 mg/dL — ABNORMAL HIGH (ref 70–99)
Potassium: 3.1 mmol/L — ABNORMAL LOW (ref 3.5–5.1)
SODIUM: 145 mmol/L (ref 135–145)

## 2018-10-10 LAB — POCT I-STAT 3, ART BLOOD GAS (G3+)
ACID-BASE EXCESS: 12 mmol/L — AB (ref 0.0–2.0)
ACID-BASE EXCESS: 7 mmol/L — AB (ref 0.0–2.0)
BICARBONATE: 32.8 mmol/L — AB (ref 20.0–28.0)
Bicarbonate: 35.4 mmol/L — ABNORMAL HIGH (ref 20.0–28.0)
O2 SAT: 88 %
O2 Saturation: 98 %
PCO2 ART: 44.2 mmHg (ref 32.0–48.0)
Patient temperature: 99.4
TCO2: 34 mmol/L — ABNORMAL HIGH (ref 22–32)
TCO2: 37 mmol/L — AB (ref 22–32)
pCO2 arterial: 49.2 mmHg — ABNORMAL HIGH (ref 32.0–48.0)
pH, Arterial: 7.433 (ref 7.350–7.450)
pH, Arterial: 7.514 — ABNORMAL HIGH (ref 7.350–7.450)
pO2, Arterial: 105 mmHg (ref 83.0–108.0)
pO2, Arterial: 51 mmHg — ABNORMAL LOW (ref 83.0–108.0)

## 2018-10-10 LAB — CULTURE, BLOOD (ROUTINE X 2)
Culture: NO GROWTH
Culture: NO GROWTH
SPECIAL REQUESTS: ADEQUATE
SPECIAL REQUESTS: ADEQUATE

## 2018-10-10 LAB — PHOSPHORUS: Phosphorus: 2.9 mg/dL (ref 2.5–4.6)

## 2018-10-10 MED ORDER — POTASSIUM CHLORIDE 20 MEQ/15ML (10%) PO SOLN
40.0000 meq | Freq: Two times a day (BID) | ORAL | Status: DC
  Filled 2018-10-10: qty 30

## 2018-10-10 MED ORDER — POTASSIUM CHLORIDE 20 MEQ/15ML (10%) PO SOLN
40.0000 meq | Freq: Two times a day (BID) | ORAL | Status: DC
  Administered 2018-10-10 – 2018-10-12 (×4): 40 meq
  Filled 2018-10-10 (×5): qty 30

## 2018-10-10 MED ORDER — POTASSIUM CHLORIDE 10 MEQ/50ML IV SOLN
10.0000 meq | INTRAVENOUS | Status: AC
  Administered 2018-10-10 (×4): 10 meq via INTRAVENOUS
  Filled 2018-10-10 (×4): qty 50

## 2018-10-10 MED ORDER — POTASSIUM CHLORIDE 20 MEQ/15ML (10%) PO SOLN
40.0000 meq | Freq: Once | ORAL | Status: AC
  Administered 2018-10-10: 40 meq
  Filled 2018-10-10: qty 30

## 2018-10-10 MED ORDER — METOLAZONE 2.5 MG PO TABS
2.5000 mg | ORAL_TABLET | Freq: Once | ORAL | Status: AC
  Administered 2018-10-10: 2.5 mg via ORAL
  Filled 2018-10-10: qty 1

## 2018-10-10 MED ORDER — ASPIRIN 325 MG PO TABS
325.0000 mg | ORAL_TABLET | Freq: Every day | ORAL | Status: DC
  Administered 2018-10-10: 325 mg
  Filled 2018-10-10: qty 1

## 2018-10-10 MED ORDER — ATORVASTATIN CALCIUM 40 MG PO TABS
40.0000 mg | ORAL_TABLET | Freq: Every day | ORAL | Status: DC
  Administered 2018-10-10 – 2018-10-11 (×2): 40 mg
  Filled 2018-10-10 (×2): qty 1

## 2018-10-10 MED ORDER — PANTOPRAZOLE SODIUM 40 MG PO PACK
40.0000 mg | PACK | Freq: Every day | ORAL | Status: DC
  Administered 2018-10-10 – 2018-10-11 (×2): 40 mg
  Filled 2018-10-10 (×2): qty 20

## 2018-10-10 MED ORDER — SPIRONOLACTONE 25 MG PO TABS: 25.0000 mg | ORAL_TABLET | Freq: Every day | ORAL | Status: DC

## 2018-10-10 MED ORDER — POTASSIUM CHLORIDE CRYS ER 20 MEQ PO TBCR: 40.0000 meq | EXTENDED_RELEASE_TABLET | Freq: Two times a day (BID) | ORAL | Status: DC

## 2018-10-10 MED ORDER — SPIRONOLACTONE 25 MG PO TABS
25.0000 mg | ORAL_TABLET | Freq: Every day | ORAL | Status: DC
  Administered 2018-10-10 – 2018-10-11 (×2): 25 mg
  Filled 2018-10-10 (×2): qty 1

## 2018-10-10 MED ORDER — ASPIRIN 81 MG PO CHEW
81.0000 mg | CHEWABLE_TABLET | Freq: Every day | ORAL | Status: DC
  Administered 2018-10-11: 81 mg via ORAL
  Filled 2018-10-10: qty 1

## 2018-10-10 MED ORDER — HYDRALAZINE HCL 10 MG PO TABS
10.0000 mg | ORAL_TABLET | Freq: Three times a day (TID) | ORAL | Status: DC
  Administered 2018-10-10 – 2018-10-13 (×8): 10 mg
  Filled 2018-10-10 (×8): qty 1

## 2018-10-10 MED ORDER — FUROSEMIDE 10 MG/ML IJ SOLN
80.0000 mg | Freq: Three times a day (TID) | INTRAMUSCULAR | Status: DC
  Administered 2018-10-10 – 2018-10-11 (×6): 80 mg via INTRAVENOUS
  Filled 2018-10-10 (×6): qty 8

## 2018-10-10 MED ORDER — DEXMEDETOMIDINE HCL IN NACL 400 MCG/100ML IV SOLN
0.4000 ug/kg/h | INTRAVENOUS | Status: DC
  Administered 2018-10-10: 0.7 ug/kg/h via INTRAVENOUS
  Administered 2018-10-10: 1.2 ug/kg/h via INTRAVENOUS
  Administered 2018-10-10: 0.4 ug/kg/h via INTRAVENOUS
  Administered 2018-10-11 (×2): 1.2 ug/kg/h via INTRAVENOUS
  Administered 2018-10-11: 0.6 ug/kg/h via INTRAVENOUS
  Administered 2018-10-11 (×2): 1.2 ug/kg/h via INTRAVENOUS
  Administered 2018-10-12: 0.6 ug/kg/h via INTRAVENOUS
  Administered 2018-10-12: 1.2 ug/kg/h via INTRAVENOUS
  Administered 2018-10-13: 0.8 ug/kg/h via INTRAVENOUS
  Filled 2018-10-10 (×9): qty 100

## 2018-10-10 NOTE — Progress Notes (Signed)
[place] Progress Note Patient Name: [name] DOB: [birth year] MRN: [id number]   Date of Service  10/10/2018  HPI/Events of Note  K+ = 3.1 and Creatinine = 1.06.  eICU Interventions  Will replace K+.     Intervention Category Major Interventions: Electrolyte abnormality - evaluation and management  Roberta Kelly Eugene 10/10/2018, 6:28 AM

## 2018-10-10 NOTE — Progress Notes (Signed)
8088 - peep decreased per MD order & pt placed on PS trial 5/8.  Increased Fio2 to 50% due to decreased spo285%. 0850 - Spo2 continued to decrease, pt placed back on full support at this time per MD.

## 2018-10-10 NOTE — Progress Notes (Signed)
NAME:  [name], MRN:  [id number], DOB:  15-Mar-1956, LOS: 5 ADMISSION DATE:  10/05/2018, CONSULTATION DATE:  10/05/18 REFERRING MD:  Darl Householder CHIEF COMPLAINT:  SOB   Brief History   [name] is a 62 y.o. male who was admitted 11/6 with NSTEMI and acute hypoxic respiratory failure requiring intubation.  Past Medical History  Spinal stenosis, shizhophrenia, bipolar disorder, depression, anxiety, polysubstance abuse, rhabdo, PVD, DVT, HCV s/p treatment with Harvoni, diabetic neuropathy, DM.  Significant Hospital Events   11/6 > admit 11/8 > TEE/ LHC  Consults: date of consult/date signed off & final recs:  Cardiology 11/6 >   Procedures (surgical and bedside):  ETT 11/6 >   Significant Diagnostic Tests:  CXR 11/6 > cardiomegaly, small b/l effusions, b/l GGO's. Echo 11/7 > Reduced LV EF 40-45% (down from 60-65% on 08/2018). Appears mildly hypokinetic globally, with moderate hypokinesis of septal/inferior/lateral walls. G3DD. Moderate to severe MR, moderate TR. LE duplex 11/7 > neg TEE 11/8  >> prelim- > severe MR, no papillary muscle rupture noted LHC 11/8 >>  1. Severe stenosis of the left circumflex - small diffusely diseased vessels 2. Diffuse nonobstructive stenosis of the LAD and RCA 3. Widely patent left main 4. Severely elevated LVEDP 5. Moderate pulmonary HTN likely secondary to left heart failure (transpulmonic gradient 5-10 mmHg) 6. Severe MR by noninvasive assessment The circumflex/OM branches are borderline for PCI or grafting - targets appear 2 mm or less  Micro Data:  Blood 11/6 > ngtd Sputum 11/6 >  ng Urine 11/6 > neg MRSA PCR 11/6 >> neg  Antimicrobials:  Vanc 11/6 > 11/7 Cefepime 11/6 >   Subjective:  No acute events. -2.3L off. Down to 40% FiO2 and 8 PEEP.  Objective:  Blood pressure 138/74, pulse (!) 113, temperature 100.3 F (37.9 C), temperature source Oral, resp. rate 20, height 5\' 11"  (1.803 m), weight 83.6 kg, SpO2 100 %. CVP:  [2  mmHg-13 mmHg] 13 mmHg  Vent Mode: PRVC FiO2 (%):  [40 %-50 %] 40 % Set Rate:  [18 bmp] 18 bmp Vt Set:  [600 mL] 600 mL PEEP:  [8 cmH20-10 cmH20] 8 cmH20 Pressure Support:  [5 cmH20] 5 cmH20 Plateau Pressure:  [21 QIO96-29 cmH20] 22 cmH20   Intake/Output Summary (Last 24 hours) at 10/10/2018 1041 Last data filed at 10/10/2018 1026 Gross per 24 hour  Intake 2314.69 ml  Output 5905 ml  Net -3590.31 ml   Filed Weights   10/08/18 0400 10/09/18 0500 10/10/18 0600  Weight: 87.4 kg 86.6 kg 83.6 kg    Examination:  General: Adult male, resting in bed, in NAD. Neuro: Sedated, withdraws to tactile stimuli. HEENT: [place]/AT. Sclerae anicteric, ETT in place. Cardiovascular: RRR, no M/R/G.  Lungs: Respirations even and unlabored.  CTA bilaterally, No W/R/R. Abdomen: BS x 4, soft, NT/ND.  Musculoskeletal: No gross deformities, no edema.  Skin: Intact, warm, no rashes.   Assessment & Plan:   NSTEMI with new severe MR and acute systolic HF. - Not amenable to PCI  would consider a mitral clip if unable to wean from MV.  - Continue ASA, atorvastatin, lasix (increased by cards 11/11 - agree), hydral/imdur.  Acute hypoxic respiratory failure - Recurrent desaturation every time PEEP is lowered last few days. - Drop FIO2, keep PEEP @10 . - Continue diuresis (dose increased by cards 11/11 - agree). - Follow CXR.  Possible HCAP - cultures negative. - Continue empiric cefepime, last day today.  DM. - SSI moderate/ CBG q 4. -  Holding preadmission  metformin, sitagliptin.  Mild AKI - resolved. Hypocalcemia - pseudo, corrects 9.4. Hypomagnesium - resolved. - Follow trend BMP/ UOP/ daily wt.  Hypokalemia - s/p repletion. - follow BMP.  Hx substance abuse (UDS on prior admits and this admit positive for cocaine), schizophrenia, bipolar, anxiety, depression. - Substance abuse counseling when able, grand daughter was not aware. - Holding  preadmission gabapentin, sertraline. - Resumed  trazodone & risperdal instead of invega (got last shot 10/21)  Disposition / Summary of Today's Plan 10/10/18    Does not tolerate lowering of PEEP.  Hopefully oxygenation requirements will continue to improve as volume status is optimized (currently on 40% FiO2 and 8 PEEP). Hoping MR will temporise with medical optimisation - no surgical options here     Code Status: Full. Family Communication: Daughter updated at bedside 11/11.  CC time: 30 min.   Montey Hora, [place] [facility] Pager: [phone]  or [phone] 10/10/2018, 11:41 AM

## 2018-10-10 NOTE — Progress Notes (Addendum)
Patient ID: [name], male   DOB: [birth year], 62 y.o.   MRN: [id number]     Advanced Heart Failure Rounding Note  PCP-Cardiologist: No primary care provider on file.   Subjective:    Started on hydral/imdur yesterday for afterload reduction. SBP 90-120s.   Remains intubated. Awake. FiO2 up to 100% overnight with desatuartions, now at 50%. Great diuresis with -2.3 L off. Weight down 6 more lbs. Coox 69%. CVP 13-14. Creatinine stable 1.06.   Tmax 99.9. WBC 8.6. Remains on cefepime (last day today). Hemoglobin 8.6 > 8.3.   15 beats NSVT overnight. Also had runs of bigeminy and SVT/sinus tach 140s. K 3.1, mag 2.0.  LHC/RHC 11/8:  Severe LCx system disease RA mean 12 PA 52/25 PCWP mean 27 LVEDP 32 CI 3.34  TEE 11/8: EF 35-40%, inf HK, severe central MR with no papillary muscle rupture or primary MV disease.   CXR today: Stable bibasilar changes with atelectasis and effusions (looks better on my review)  Objective:   Weight Range: 83.6 kg Body mass index is 25.71 kg/m.   Vital Signs:   Temp:  [98.5 F (36.9 C)-99.9 F (37.7 C)] 99.4 F (37.4 C) (11/11 0400) Pulse Rate:  [41-138] 105 (11/11 0751) Resp:  [18-34] 25 (11/11 0751) BP: (85-149)/(55-88) 126/83 (11/11 0751) SpO2:  [91 %-100 %] 100 % (11/11 0745) FiO2 (%):  [40 %-50 %] 40 % (11/11 0751) Weight:  [83.6 kg] 83.6 kg (11/11 0600) Last BM Date: (PTA)  Weight change: Filed Weights   10/08/18 0400 10/09/18 0500 10/10/18 0600  Weight: 87.4 kg 86.6 kg 83.6 kg    Intake/Output:   Intake/Output Summary (Last 24 hours) at 10/10/2018 0809 Last data filed at 10/10/2018 0800 Gross per 24 hour  Intake 2444.69 ml  Output 4805 ml  Net -2360.31 ml      Physical Exam    General: Intubated, Awake  HEENT: + ETT Neck: Supple. JVP 13-14. Carotids 2+ bilat; no bruits. No thyromegaly or nodule noted. Cor: PMI nondisplaced. RRR, soft HSM apex Lungs: Diminished basilar sounds Abdomen: Soft, non-tender,  non-distended, no HSM. No bruits or masses. +BS  Extremities: No cyanosis, clubbing, or rash. Trace ankle edema. LUE PICC line.  Neuro: Intubated, awake  Telemetry   NSR 80-90s generally, runs of bigeminy and sinus tach 140s, 15 beats NSVT 120s. Personally reviewed.   Labs    CBC Recent Labs    10/09/18 0204 10/10/18 0418  WBC 9.2 8.6  HGB 8.6* 8.3*  HCT 27.5* 26.7*  MCV 97.9 98.2  PLT 146* 761   Basic Metabolic Panel Recent Labs    10/09/18 0204 10/09/18 0507 10/10/18 0418  NA 145 146* 145  K 3.4* 3.7 3.1*  CL 111 112* 107  CO2 _0 GLUCOSE 199* 162* 183*  BUN 26* 24* 24*  CREATININE 1.10 1.08 1.06  CALCIUM 8.6* 8.8* 8.9  MG 2.0  --  2.0  PHOS 2.8  --  2.9   Liver Function Tests Recent Labs    10/08/18 0607  ALBUMIN 2.5*   No results for input(s): LIPASE, AMYLASE in the last 72 hours. Cardiac Enzymes No results for input(s): CKTOTAL, CKMB, CKMBINDEX, TROPONINI in the last 72 hours.  BNP: BNP (last 3 results) Recent Labs    10/05/18 1436  BNP 779.2*    ProBNP (last 3 results) No results for input(s): PROBNP in the last 8760 hours.   D-Dimer No results for input(s): DDIMER in the last 72 hours. Hemoglobin A1C  No results for input(s): HGBA1C in the last 72 hours. Fasting Lipid Panel No results for input(s): CHOL, HDL, LDLCALC, TRIG, CHOLHDL, LDLDIRECT in the last 72 hours. Thyroid Function Tests No results for input(s): TSH, T4TOTAL, T3FREE, THYROIDAB in the last 72 hours.  Invalid input(s): FREET3  Other results:   Imaging    Dg Chest Port 1 View  Result Date: 10/10/2018 CLINICAL DATA:  Acute respiratory EXAM: PORTABLE CHEST 1 VIEW COMPARISON:  10/09/2018 FINDINGS: Cardiac shadow is stable. Endotracheal tube, nasogastric catheter and PICC line are again seen and stable. The nasogastric catheter proximal port again lies in the distal esophagus and could be advanced several cm. The lungs are well aerated bilaterally. Slight increased  density is noted in the bases bilaterally again related to small effusions. These is stable from the previous exam. Bibasilar atelectatic changes are again seen as well. IMPRESSION: Stable bibasilar changes with atelectasis and effusions. Tubes and lines as described. The nasogastric catheter could be advanced several cm as necessary. Electronically Signed   By: Inez Catalina M.D.   On: 10/10/2018 07:37     Medications:     Scheduled Medications: . aspirin  325 mg Oral Daily  . atorvastatin  40 mg Oral q1800  . benztropine  1 mg Per Tube BID  . chlorhexidine gluconate (MEDLINE KIT)  15 mL Mouth Rinse BID  . Chlorhexidine Gluconate Cloth  6 each Topical Daily  . docusate  100 mg Per Tube BID  . feeding supplement (PRO-STAT SUGAR FREE 64)  30 mL Per Tube BID  . furosemide  80 mg Intravenous Q12H  . heparin  5,000 Units Subcutaneous Q8H  . hydrALAZINE  10 mg Oral Q8H  . insulin aspart  0-15 Units Subcutaneous Q4H  . ipratropium-albuterol  3 mL Nebulization Once  . isosorbide dinitrate  10 mg Per Tube BID  . mouth rinse  15 mL Mouth Rinse 10 times per day  . pantoprazole (PROTONIX) IV  40 mg Intravenous QHS  . risperiDONE  1 mg Per Tube BID  . sodium chloride flush  10-40 mL Intracatheter Q12H  . sodium chloride flush  3 mL Intravenous Q12H  . spironolactone  12.5 mg Oral Daily  . traZODone  75 mg Per Tube QHS    Infusions: . sodium chloride Stopped (10/07/18 1054)  . sodium chloride Stopped (10/10/18 0646)  . ceFEPime (MAXIPIME) IV Stopped (10/09/18 2302)  . feeding supplement (VITAL 1.5 CAL) 1,000 mL (10/09/18 1450)  . fentaNYL infusion INTRAVENOUS 250 mcg/hr (10/10/18 0700)  . potassium chloride 10 mEq (10/10/18 0757)    PRN Medications: sodium chloride, sodium chloride, acetaminophen (TYLENOL) oral liquid 160 mg/5 mL, bisacodyl, fentaNYL, midazolam, sodium chloride flush, sodium chloride flush    Patient Profile   62 y/o male with spinal stenosis, schizophrenia, bipolar  disorder, polysubstance abuse, PAD, h/o DVT, HCV s/p treatment with Harvoni, DM with diabetic neuropathy,recent rhabdo admitted on 11/6 with NSTEMI (Peak trop 26.2) and respiratory failure requiring intubation.  Assessment/Plan   1. CAD: NSTEMI.  Severe diffuse disease in LCx system not amenable to PCI or CABG.  Medical management.  - Continue ASA 325, atorvastatin 40 daily.  2. Acute systolic CHF: TEE with EF 35-40% and severe central mitral regurgitation.  High filling pressures on RHC with preserved cardiac output.  Co-ox 69% today with CVP 13-14. CXR improving - Continue Lasix 80 mg IV bid.  - Severe MR likely plays a role in CHF/respiratory failure. Continue hydral 10 mg TID/isosorbide dinitrate 10 mg BID (  unable to crush imdur). SBP 90-120s - Good co-ox 69%, hold off on inotrope.  3. Acute hypoxemic respiratory failure: Suspect pulmonary edema +/- aspiration.  - Remains on Cefepime to cover aspiration (last day today). Tmax 99.8, but WBC is stable 8.6.  - Continue diuresis.   4. Polysubstance abuse: Long-standing. No change.  5. Mitral regurgitation: Secondary, infarct-related (LCx territory). No papillary muscle rupture.  At this point, plan medical management of acute CHF.  Not surgical candidate.  Mitraclip may be consideration down the road but probable pure secondary MR makes difficult at this time. No change.  6. Schizophrenia.  7. Anemia - Hemoglobin 8.3 today. No overt bleeding.  8. Hypokalemia - K 3.1. Supp 9. NSVT - Supplementing K. Mag 2.0.   Length of Stay: [place], NP  10/10/2018, 8:09 AM  Advanced Heart Failure Team Pager [phone] (M-F; 7a - 4p)  Please contact [facility] for night-coverage after hours (4p -7a ) and weekends on [email or web address]  Agree with above.    Awake on vent but respiratory status still tenuous. CXR still wet. CVP 13. Co-ox ok at 69%. Remains in sinus tach but with some NSVT overnight. K 3.1.   Awake on vent CVP to jaw Cor  tachy regular soft MR Lungs coarse Ab soft NT Ext warm trace edema  Cardiac output stable but oxygenation remains tenuous. We will not be able to wean until volume status improves further. Will increase lasix to 80IV tid and add metolazone 2.5 x 1. With NSVT will need to watch potassium closely. Will give extra supp. Suspect MR may improve as volume status improves as well. D/w daughter at bedside.   CRITICAL CARE Performed by: Glori Bickers  Total critical care time: 35 minutes  Critical care time was exclusive of separately billable procedures and treating other patients.  Critical care was necessary to treat or prevent imminent or life-threatening deterioration.  Critical care was time spent personally by me (independent of midlevel providers or residents) on the following activities: development of treatment plan with patient and/or surrogate as well as nursing, discussions with consultants, evaluation of patient's response to treatment, examination of patient, obtaining history from patient or surrogate, ordering and performing treatments and interventions, ordering and review of laboratory studies, ordering and review of radiographic studies, pulse oximetry and re-evaluation of patient's condition.  Glori Bickers, MD  8:43 AM

## 2018-10-11 ENCOUNTER — Other Ambulatory Visit: Payer: Self-pay

## 2018-10-11 ENCOUNTER — Encounter (HOSPITAL_COMMUNITY): Payer: Self-pay

## 2018-10-11 ENCOUNTER — Inpatient Hospital Stay (HOSPITAL_COMMUNITY): Payer: Medicare Other

## 2018-10-11 DIAGNOSIS — I5021 Acute systolic (congestive) heart failure: Secondary | ICD-10-CM

## 2018-10-11 DIAGNOSIS — Z9911 Dependence on respirator [ventilator] status: Secondary | ICD-10-CM

## 2018-10-11 LAB — GLUCOSE, CAPILLARY
GLUCOSE-CAPILLARY: 246 mg/dL — AB (ref 70–99)
GLUCOSE-CAPILLARY: 221 mg/dL — AB (ref 70–99)
Glucose-Capillary: 253 mg/dL — ABNORMAL HIGH (ref 70–99)
Glucose-Capillary: 287 mg/dL — ABNORMAL HIGH (ref 70–99)
Glucose-Capillary: 278 mg/dL — ABNORMAL HIGH (ref 70–99)
Glucose-Capillary: 333 mg/dL — ABNORMAL HIGH (ref 70–99)

## 2018-10-11 LAB — BASIC METABOLIC PANEL
Anion gap: 8 (ref 5–15)
Anion gap: 4 — ABNORMAL LOW (ref 5–15)
BUN: 30 mg/dL — AB (ref 8–23)
BUN: 27 mg/dL — ABNORMAL HIGH (ref 8–23)
CALCIUM: 8.9 mg/dL (ref 8.9–10.3)
CHLORIDE: 104 mmol/L (ref 98–111)
CO2: 34 mmol/L — ABNORMAL HIGH (ref 22–32)
CO2: 34 mmol/L — ABNORMAL HIGH (ref 22–32)
CREATININE: 1.17 mg/dL (ref 0.61–1.24)
Calcium: 9.6 mg/dL (ref 8.9–10.3)
Chloride: 112 mmol/L — ABNORMAL HIGH (ref 98–111)
Creatinine, Ser: 1.22 mg/dL (ref 0.61–1.24)
GFR calc Af Amer: >60 mL/min (ref >60)
GFR calc Af Amer: >60 mL/min (ref >60)
GFR calc non Af Amer: >60 mL/min (ref >60)
GFR calc non Af Amer: >60 mL/min (ref >60)
Glucose, Bld: 239 mg/dL — ABNORMAL HIGH (ref 70–99)
Glucose, Bld: 218 mg/dL — ABNORMAL HIGH (ref 70–99)
POTASSIUM: 3.6 mmol/L (ref 3.5–5.1)
Potassium: 3.8 mmol/L (ref 3.5–5.1)
SODIUM: 150 mmol/L — AB (ref 135–145)
Sodium: 146 mmol/L — ABNORMAL HIGH (ref 135–145)

## 2018-10-11 LAB — COOXEMETRY PANEL
CARBOXYHEMOGLOBIN: 2.5 % — AB (ref 0.5–1.5)
Carboxyhemoglobin: 1.8 % — ABNORMAL HIGH (ref 0.5–1.5)
Methemoglobin: 1.3 % (ref 0.0–1.5)
Methemoglobin: 1.7 % — ABNORMAL HIGH (ref 0.0–1.5)
O2 SAT: 99.7 %
O2 Saturation: 65.9 %
TOTAL HEMOGLOBIN: 9.3 g/dL — AB (ref 12.0–16.0)
Total hemoglobin: 9 g/dL — ABNORMAL LOW (ref 12.0–16.0)

## 2018-10-11 LAB — CBC WITH DIFFERENTIAL/PLATELET
ABS IMMATURE GRANULOCYTES: 0.08 10*3/uL — AB (ref 0.00–0.07)
BASOS ABS: 0.1 10*3/uL (ref 0.0–0.1)
Basophils Relative: 1 %
Eosinophils Absolute: 0.4 10*3/uL (ref 0.0–0.5)
Eosinophils Relative: 4 %
HCT: 30.3 % — ABNORMAL LOW (ref 39.0–52.0)
HEMOGLOBIN: 9.4 g/dL — AB (ref 13.0–17.0)
Immature Granulocytes: 1 %
LYMPHS PCT: 20 %
Lymphs Abs: 2.1 10*3/uL (ref 0.7–4.0)
MCH: 30.5 pg (ref 26.0–34.0)
MCHC: 31 g/dL (ref 30.0–36.0)
MCV: 98.4 fL (ref 80.0–100.0)
MONO ABS: 0.9 10*3/uL (ref 0.1–1.0)
Monocytes Relative: 9 %
NEUTROS ABS: 6.8 10*3/uL (ref 1.7–7.7)
Neutrophils Relative %: 65 %
Platelets: 179 10*3/uL (ref 150–400)
RBC: 3.08 MIL/uL — AB (ref 4.22–5.81)
RDW: 12.9 % (ref 11.5–15.5)
WBC: 10.3 10*3/uL (ref 4.0–10.5)
nRBC: 0 % (ref 0.0–0.2)

## 2018-10-11 LAB — POCT I-STAT 3, ART BLOOD GAS (G3+)
ACID-BASE EXCESS: 15 mmol/L — AB (ref 0.0–2.0)
BICARBONATE: 38.6 mmol/L — AB (ref 20.0–28.0)
O2 Saturation: 95 %
TCO2: 40 mmol/L — ABNORMAL HIGH (ref 22–32)
pCO2 arterial: 43.4 mmHg (ref 32.0–48.0)
pH, Arterial: 7.557 — ABNORMAL HIGH (ref 7.350–7.450)
pO2, Arterial: 66 mmHg — ABNORMAL LOW (ref 83.0–108.0)

## 2018-10-11 LAB — PHOSPHORUS: PHOSPHORUS: 3.1 mg/dL (ref 2.5–4.6)

## 2018-10-11 LAB — MAGNESIUM: MAGNESIUM: 2.3 mg/dL (ref 1.7–2.4)

## 2018-10-11 MED ORDER — INSULIN GLARGINE 100 UNIT/ML ~~LOC~~ SOLN
5.0000 [IU] | Freq: Two times a day (BID) | SUBCUTANEOUS | Status: DC
  Administered 2018-10-11 – 2018-10-16 (×11): 5 [IU] via SUBCUTANEOUS
  Filled 2018-10-11 (×13): qty 0.05

## 2018-10-11 MED ORDER — ASPIRIN 81 MG PO CHEW: 81.0000 mg | CHEWABLE_TABLET | Freq: Every day | ORAL | Status: DC

## 2018-10-11 MED ORDER — POTASSIUM CHLORIDE 20 MEQ/15ML (10%) PO SOLN
40.0000 meq | Freq: Once | ORAL | Status: AC
  Administered 2018-10-11: 40 meq via ORAL
  Filled 2018-10-11: qty 30

## 2018-10-11 MED ORDER — INSULIN ASPART 100 UNIT/ML ~~LOC~~ SOLN
0.0000 [IU] | SUBCUTANEOUS | Status: DC
  Administered 2018-10-11: 15 [IU] via SUBCUTANEOUS
  Administered 2018-10-11: 7 [IU] via SUBCUTANEOUS
  Administered 2018-10-11 (×2): 11 [IU] via SUBCUTANEOUS
  Administered 2018-10-12: 20 [IU] via SUBCUTANEOUS
  Administered 2018-10-12 (×2): 4 [IU] via SUBCUTANEOUS
  Administered 2018-10-12: 7 [IU] via SUBCUTANEOUS
  Administered 2018-10-12: 4 [IU] via SUBCUTANEOUS
  Administered 2018-10-12: 7 [IU] via SUBCUTANEOUS
  Administered 2018-10-13 (×3): 4 [IU] via SUBCUTANEOUS

## 2018-10-11 MED ORDER — POTASSIUM CHLORIDE 20 MEQ/15ML (10%) PO SOLN
40.0000 meq | Freq: Once | ORAL | Status: AC
  Administered 2018-10-11: 40 meq
  Filled 2018-10-11: qty 30

## 2018-10-11 NOTE — Progress Notes (Signed)
Intermittently follows commands, moves all extremities, perrl. Dex and low dose fentanyl with prn boluses helping keeping patient more awake. RASS -2. SBP 130's/70's. MAP stable. NSR with occasional pac/pvc. TF @ goal. No bm this shift, hypoactive. UOP 4700 this shift s/p lasix. CVP 6-9. K replaced. Family @ bedside.

## 2018-10-11 NOTE — Progress Notes (Signed)
Placed on ps 8/5, tol for 10 minutes. Placed back to full support 2/2 tachypnea to mid 40's, abdominal retraction. Current RR 30.

## 2018-10-11 NOTE — Progress Notes (Signed)
NAME:  [name], MRN:  [id number], DOB:  11-11-1956, LOS: 6 ADMISSION DATE:  10/05/2018, CONSULTATION DATE:  10/05/18 REFERRING MD:  Darl Householder CHIEF COMPLAINT:  SOB   Brief History   [name] is a 62 y.o. male who was admitted 11/6 with NSTEMI and acute hypoxic respiratory failure requiring intubation.  Past Medical History  Spinal stenosis, shizhophrenia, bipolar disorder, depression, anxiety, polysubstance abuse, rhabdo, PVD, DVT, HCV s/p treatment with Harvoni, diabetic neuropathy, DM.  Significant Hospital Events   11/6 > admit 11/8 > TEE/ LHC  Consults: date of consult/date signed off & final recs:  Cardiology 11/6 >   Procedures (surgical and bedside):  ETT 11/6 >   Significant Diagnostic Tests:  CXR 11/6 > cardiomegaly, small b/l effusions, b/l GGO's. Echo 11/7 > Reduced LV EF 40-45% (down from 60-65% on 08/2018). Appears mildly hypokinetic globally, with moderate hypokinesis of septal/inferior/lateral walls. G3DD. Moderate to severe MR, moderate TR. LE duplex 11/7 > neg TEE 11/8  >> prelim- > severe MR, no papillary muscle rupture noted LHC 11/8 >>  1. Severe stenosis of the left circumflex - small diffusely diseased vessels 2. Diffuse nonobstructive stenosis of the LAD and RCA 3. Widely patent left main 4. Severely elevated LVEDP 5. Moderate pulmonary HTN likely secondary to left heart failure (transpulmonic gradient 5-10 mmHg) 6. Severe MR by noninvasive assessment The circumflex/OM branches are borderline for PCI or grafting - targets appear 2 mm or less  Micro Data:  Blood 11/6 > ngtd Sputum 11/6 >  ng Urine 11/6 > neg MRSA PCR 11/6 >> neg  Antimicrobials:  Vanc 11/6 > 11/7 Cefepime 11/6 >   Subjective:  No acute events. -2.3L off. Down to 40% FiO2 and 8 PEEP.  Objective:  Blood pressure 139/62, pulse 67, temperature 100.1 F (37.8 C), temperature source Axillary, resp. rate 18, height _0  (1.803 m), weight 78.9 kg, SpO2 98 %. CVP:  [4 mmHg-6  mmHg] 5 mmHg  Vent Mode: PRVC FiO2 (%):  [40 %] 40 % Set Rate:  [18 bmp] 18 bmp Vt Set:  [600 mL] 600 mL PEEP:  [8 cmH20] 8 cmH20 Plateau Pressure:  [20 cmH20-22 cmH20] 20 cmH20   Intake/Output Summary (Last 24 hours) at 10/11/2018 0830 Last data filed at 10/11/2018 0800 Gross per 24 hour  Intake 2541.22 ml  Output 6010 ml  Net -3468.78 ml   Filed Weights   10/09/18 0500 10/10/18 0600 10/11/18 0214  Weight: 86.6 kg 83.6 kg 78.9 kg    Examination:  General: Cachectic male who is heavily sedated HEENT: No lymphadenopathy or JVD appreciated Neuro: Heavily sedated not responsive to noxious stimuli CV: Heart sounds are regular PULM: even/non-labored, lungs bilaterally mild rhonchi WU:[id number], non-tender, bsx4 active  Extremities: warm/dry, negative edema  Skin: no rashes or lesions   Intake/Output Summary (Last 24 hours) at 10/11/2018 0835 Last data filed at 10/11/2018 0800 Gross per 24 hour  Intake 2541.22 ml  Output 6010 ml  Net -3468.78 ml   CBG (last 3)  Recent Labs    10/10/18 2331 10/11/18 0349 10/11/18 0809  GLUCAP 287* 246* 253*      Assessment & Plan:   NSTEMI with new severe MR and acute systolic HF. -Not a candidate for PCI questionable mitral clip in the future -Continue aspirin statin diuresis per cardiology  Acute hypoxic respiratory failure - Recurrent desaturation every time PEEP is lowered last few days. -Decrease sedation -Wean per protocol  Possible HCAP - cultures negative. -Completed empiric Maxipime  DM. -Sliding scale insulin protocol -Hold oral medications at this time  Mild AKI - resolved.. -Trend daily BMP and urine output.  Hypokalemia - s/p repletion. -Replete as needed -Check be met at 1400 hrs. with aggressive diuresis in place.  Hx substance abuse (UDS on prior admits and this admit positive for cocaine), schizophrenia, bipolar, anxiety, depression. -Counseling once stabilized -Holding preadmission pain  medication -Trazodone and Risperdal have better restarted   Disposition / Summary of Today's Plan 10/11/18    10/11/2018 currently heavily sedated have asked nurse to decrease sedation and evaluate for possible weaning from ventilator he is 8 L negative this should provide the best opportunity to extubate him.     Code Status: Full. Family Communication: 10/11/2018 no family at bedside  CC time: 35 min.   Richardson Landry Carrie Schoonmaker ACNP [facility] PCCM Pager [phone] till 1 pm If no answer page [phone] 10/11/2018, 8:30 AM

## 2018-10-11 NOTE — Progress Notes (Signed)
Inpatient Diabetes Program Recommendations  AACE/ADA: New Consensus Statement on Inpatient Glycemic Control (2015)  Target Ranges:  Prepandial:   less than 140 mg/dL      Peak postprandial:   less than 180 mg/dL (1-2 hours)      Critically ill patients:  140 - 180 mg/dL   Lab Results  Component Value Date   GLUCAP 278 (H) 10/11/2018   HGBA1C 5.1 11/27/2017    Review of Glycemic ControlResults for [name], [name] (MRN [id number]) as of 10/11/2018 15:12  Ref. Range 10/10/2018 19:40 10/10/2018 23:31 10/11/2018 03:49 10/11/2018 08:09 10/11/2018 11:56  Glucose-Capillary Latest Ref Range: 70 - 99 mg/dL 250 (H) 287 (H) 246 (H) 253 (H) 278 (H)    Diabetes history: DM 2 Outpatient Diabetes medications: Metformin 1000 mg bid, Januvia 100 mg q AM Current orders for Inpatient glycemic control:  Vital 50 ml/hr Novolog resistant q 4 hours, Lantus 5 units bid Inpatient Diabetes Program Recommendations:    Please consider adding Novolog 3 units q 4 hours to cover Novolog tube feeds.  Also consider increasing Lantus to 10 units bid.   Thanks Richard Perl, RN, BC-ADM Inpatient Diabetes Coordinator Pager [phone] (8a-5p)

## 2018-10-11 NOTE — Progress Notes (Addendum)
Patient ID: [name], male   DOB: Dec 04, 1955, 62 y.o.   MRN: [id number]     Advanced Heart Failure Rounding Note  PCP-Cardiologist: No primary care provider on file.   Subjective:    Remains intubated and sedated. FiO2 40%.   CVP ~9. Great diuresis with -3.6 L off with 80 mg IV lasix TID + metolazone. Weight down 11 lbs overnight (down 22 lbs total). Creatinine stable 1.22. K 3.6, mag 2.3. CXR still looks wet.  Tmax 101.5. WBC 10.3. Last dose of Cefepime yesterday. Hemoglobin 8.6 > 8.3 > 9.4.   Got a lot of sedation overnight and now not responding.   LHC/RHC 11/8:  Severe LCx system disease RA mean 12 PA 52/25 PCWP mean 27 LVEDP 32 CI 3.34  TEE 11/8: EF 35-40%, inf HK, severe central MR with no papillary muscle rupture or primary MV disease.   CXR today: Stable bibasilar changes with atelectasis and effusions (looks better on my review)  Objective:   Weight Range: 78.9 kg Body mass index is 24.26 kg/m.   Vital Signs:   Temp:  [98.5 F (36.9 C)-101.5 F (38.6 C)] 100.1 F (37.8 C) (11/12 0735) Pulse Rate:  [63-113] 67 (11/12 0800) Resp:  [17-20] 18 (11/12 0800) BP: (95-139)/(51-74) 139/62 (11/12 0800) SpO2:  [97 %-100 %] 98 % (11/12 0800) FiO2 (%):  [40 %] 40 % (11/12 0800) Weight:  [78.9 kg] 78.9 kg (11/12 0214) Last BM Date: (PTA)  Weight change: Filed Weights   10/09/18 0500 10/10/18 0600 10/11/18 0214  Weight: 86.6 kg 83.6 kg 78.9 kg    Intake/Output:   Intake/Output Summary (Last 24 hours) at 10/11/2018 0827 Last data filed at 10/11/2018 0800 Gross per 24 hour  Intake 2541.22 ml  Output 6010 ml  Net -3468.78 ml      Physical Exam    General: Intubated and sedated HEENT: + ETT Neck: Supple. JVP ~10. Carotids 2+ bilat; no bruits. No thyromegaly or nodule noted. Cor: PMI nondisplaced. RRR, soft HSM at apex Lungs: Diminished basilar sounds Abdomen: Soft, non-tender, non-distended, no HSM. No bruits or masses. +BS  Extremities: No  cyanosis, clubbing, or rash. Trace ankle edema. LUE PICC line.  Neuro: Intubated and sedated.   Telemetry   NSR 60-70s. Personally reviewed.   Labs    CBC Recent Labs    10/10/18 0418 10/11/18 0417  WBC 8.6 10.3  NEUTROABS  --  6.8  HGB 8.3* 9.4*  HCT 26.7* 30.3*  MCV 98.2 98.4  PLT 151 026   Basic Metabolic Panel Recent Labs    10/10/18 0418 10/11/18 0417  NA 145 146*  K 3.1* 3.6  CL 107 104  CO2 31 34*  GLUCOSE 183* 239*  BUN 24* 30*  CREATININE 1.06 1.22  CALCIUM 8.9 9.6  MG 2.0 2.3  PHOS 2.9 3.1   Liver Function Tests No results for input(s): AST, ALT, ALKPHOS, BILITOT, PROT, ALBUMIN in the last 72 hours. No results for input(s): LIPASE, AMYLASE in the last 72 hours. Cardiac Enzymes No results for input(s): CKTOTAL, CKMB, CKMBINDEX, TROPONINI in the last 72 hours.  BNP: BNP (last 3 results) Recent Labs    10/05/18 1436  BNP 779.2*    ProBNP (last 3 results) No results for input(s): PROBNP in the last 8760 hours.   D-Dimer No results for input(s): DDIMER in the last 72 hours. Hemoglobin A1C No results for input(s): HGBA1C in the last 72 hours. Fasting Lipid Panel No results for input(s): CHOL, HDL, LDLCALC, TRIG,  CHOLHDL, LDLDIRECT in the last 72 hours. Thyroid Function Tests No results for input(s): TSH, T4TOTAL, T3FREE, THYROIDAB in the last 72 hours.  Invalid input(s): FREET3  Other results:   Imaging    No results found.   Medications:     Scheduled Medications: . aspirin  81 mg Oral Daily  . atorvastatin  40 mg Per Tube q1800  . benztropine  1 mg Per Tube BID  . chlorhexidine gluconate (MEDLINE KIT)  15 mL Mouth Rinse BID  . Chlorhexidine Gluconate Cloth  6 each Topical Daily  . docusate  100 mg Per Tube BID  . feeding supplement (PRO-STAT SUGAR FREE 64)  30 mL Per Tube BID  . furosemide  80 mg Intravenous TID PC  . heparin  5,000 Units Subcutaneous Q8H  . hydrALAZINE  10 mg Per Tube Q8H  . insulin aspart  0-15 Units  Subcutaneous Q4H  . ipratropium-albuterol  3 mL Nebulization Once  . isosorbide dinitrate  10 mg Per Tube BID  . mouth rinse  15 mL Mouth Rinse 10 times per day  . pantoprazole sodium  40 mg Per Tube QHS  . potassium chloride  40 mEq Per Tube BID  . risperiDONE  1 mg Per Tube BID  . sodium chloride flush  10-40 mL Intracatheter Q12H  . sodium chloride flush  3 mL Intravenous Q12H  . spironolactone  25 mg Per Tube Daily  . traZODone  75 mg Per Tube QHS    Infusions: . sodium chloride Stopped (10/07/18 1054)  . sodium chloride 10 mL/hr at 10/11/18 0800  . dexmedetomidine (PRECEDEX) IV infusion 0.6 mcg/kg/hr (10/11/18 0800)  . feeding supplement (VITAL 1.5 CAL) 50 mL/hr at 10/11/18 0800  . fentaNYL infusion INTRAVENOUS 100 mcg/hr (10/11/18 0800)    PRN Medications: sodium chloride, sodium chloride, acetaminophen (TYLENOL) oral liquid 160 mg/5 mL, bisacodyl, fentaNYL, midazolam, sodium chloride flush, sodium chloride flush    Patient Profile   62 y/o male with spinal stenosis, schizophrenia, bipolar disorder, polysubstance abuse, PAD, h/o DVT, HCV s/p treatment with Harvoni, DM with diabetic neuropathy,recent rhabdo admitted on 11/6 with NSTEMI (Peak trop 26.2) and respiratory failure requiring intubation.  Assessment/Plan   1. CAD: NSTEMI.  Severe diffuse disease in LCx system not amenable to PCI or CABG.  Medical management. No change.  - Continue ASA 325, atorvastatin 40 daily.  2. Acute systolic CHF: TEE with EF 35-40% and severe central mitral regurgitation.  High filling pressures on RHC with preserved cardiac output. CVP 9-10. Coox pending. - Continue Lasix 80 mg IV TID for now.  - Severe MR likely plays a role in CHF/respiratory failure. Continue hydral 10 mg TID/isosorbide dinitrate 10 mg BID (unable to crush imdur). SBP 110-130s 3. Acute hypoxemic respiratory failure: Suspect pulmonary edema +/- aspiration.  - Cefepime finished 11/11. Tmax 101.5. WBC 10.3 - FiO2 40%  today.  4. Polysubstance abuse: Long-standing. No change.  5. Mitral regurgitation: Secondary, infarct-related (LCx territory). No papillary muscle rupture.  At this point, plan medical management of acute CHF.  Not surgical candidate.  Mitraclip may be consideration down the road but probable pure secondary MR makes difficult at this time. Expect to improve with diuresis.  6. Schizophrenia.  7. Anemia - Hemoglobin 9.4.  8. Hypokalemia - K 3.6. Supp.  9. NSVT - K 3.6, mag 2.3   Length of Stay: [place], NP  10/11/2018, 8:27 AM  Advanced Heart Failure Team Pager [phone] (M-F; 7a - 4p)  Please contact  [facility] for night-coverage after hours (4p -7a ) and weekends on [email or web address]  Agree with above.  Diuresing well on current regimen. Weight down 12 pounds. Co-ox looks good. Was agitated last night and received high-level of sedatives. Now not responding. Sedation has been adjusted. Rhythm stable. Failed vent wean this am however. K low.   Intubated/sedated JVP 8 Cor RRR Lungs Coarse Ab soft NT Ext warm trace edema + sores   Diuresing well but failed vent wean. Will continue to diurese as tolerated. CCM adjusting sedation. Hopefully can extubate soon. Continue medical therapy of HF and CAD. Will repeat echo once diuresed and off vent to reassess EF and MR. Suspect MR will improve as HF tuned up.   CRITICAL CARE Performed by: Glori Bickers  Total critical care time: 35 minutes  Critical care time was exclusive of separately billable procedures and treating other patients.  Critical care was necessary to treat or prevent imminent or life-threatening deterioration.  Critical care was time spent personally by me (independent of midlevel providers or residents) on the following activities: development of treatment plan with patient and/or surrogate as well as nursing, discussions with consultants, evaluation of patient's response to treatment, examination of  patient, obtaining history from patient or surrogate, ordering and performing treatments and interventions, ordering and review of laboratory studies, ordering and review of radiographic studies, pulse oximetry and re-evaluation of patient's condition.   Glori Bickers, MD  6:41 PM

## 2018-10-12 ENCOUNTER — Inpatient Hospital Stay (HOSPITAL_COMMUNITY): Payer: Medicare Other

## 2018-10-12 LAB — BASIC METABOLIC PANEL
ANION GAP: 8 (ref 5–15)
BUN: 36 mg/dL — ABNORMAL HIGH (ref 8–23)
CO2: 35 mmol/L — AB (ref 22–32)
Calcium: 9.9 mg/dL (ref 8.9–10.3)
Chloride: 108 mmol/L (ref 98–111)
Creatinine, Ser: 1.4 mg/dL — ABNORMAL HIGH (ref 0.61–1.24)
GFR, EST NON AFRICAN AMERICAN: 53 mL/min — AB (ref >60)
GLUCOSE: 213 mg/dL — AB (ref 70–99)
POTASSIUM: 3.5 mmol/L (ref 3.5–5.1)
Sodium: 151 mmol/L — ABNORMAL HIGH (ref 135–145)

## 2018-10-12 LAB — CBC WITH DIFFERENTIAL/PLATELET
Abs Immature Granulocytes: 0.12 10*3/uL — ABNORMAL HIGH (ref 0.00–0.07)
BASOS PCT: 1 %
Basophils Absolute: 0.1 10*3/uL (ref 0.0–0.1)
EOS ABS: 0.4 10*3/uL (ref 0.0–0.5)
EOS PCT: 3 %
HEMATOCRIT: 32.9 % — AB (ref 39.0–52.0)
Hemoglobin: 10 g/dL — ABNORMAL LOW (ref 13.0–17.0)
Immature Granulocytes: 1 %
LYMPHS ABS: 2.6 10*3/uL (ref 0.7–4.0)
Lymphocytes Relative: 20 %
MCH: 30.1 pg (ref 26.0–34.0)
MCHC: 30.4 g/dL (ref 30.0–36.0)
MCV: 99.1 fL (ref 80.0–100.0)
MONOS PCT: 9 %
Monocytes Absolute: 1.2 10*3/uL — ABNORMAL HIGH (ref 0.1–1.0)
NEUTROS PCT: 66 %
Neutro Abs: 8.4 10*3/uL — ABNORMAL HIGH (ref 1.7–7.7)
PLATELETS: 196 10*3/uL (ref 150–400)
RBC: 3.32 MIL/uL — ABNORMAL LOW (ref 4.22–5.81)
RDW: 12.7 % (ref 11.5–15.5)
WBC: 12.8 10*3/uL — ABNORMAL HIGH (ref 4.0–10.5)
nRBC: 0 % (ref 0.0–0.2)

## 2018-10-12 LAB — GLUCOSE, CAPILLARY
GLUCOSE-CAPILLARY: 191 mg/dL — AB (ref 70–99)
GLUCOSE-CAPILLARY: 225 mg/dL — AB (ref 70–99)
GLUCOSE-CAPILLARY: 174 mg/dL — AB (ref 70–99)
Glucose-Capillary: 204 mg/dL — ABNORMAL HIGH (ref 70–99)
Glucose-Capillary: 282 mg/dL — ABNORMAL HIGH (ref 70–99)
Glucose-Capillary: 290 mg/dL — ABNORMAL HIGH (ref 70–99)
Glucose-Capillary: 388 mg/dL — ABNORMAL HIGH (ref 70–99)

## 2018-10-12 LAB — PHOSPHORUS: Phosphorus: 4.1 mg/dL (ref 2.5–4.6)

## 2018-10-12 LAB — COOXEMETRY PANEL
CARBOXYHEMOGLOBIN: 2 % — AB (ref 0.5–1.5)
METHEMOGLOBIN: 1.1 % (ref 0.0–1.5)
O2 SAT: 62.8 %
Total hemoglobin: 10.8 g/dL — ABNORMAL LOW (ref 12.0–16.0)

## 2018-10-12 LAB — MAGNESIUM: Magnesium: 2.5 mg/dL — ABNORMAL HIGH (ref 1.7–2.4)

## 2018-10-12 MED ORDER — SPIRONOLACTONE 25 MG PO TABS
25.0000 mg | ORAL_TABLET | Freq: Every day | ORAL | Status: DC
  Administered 2018-10-12 – 2018-10-16 (×5): 25 mg via ORAL
  Filled 2018-10-12 (×5): qty 1

## 2018-10-12 MED ORDER — FREE WATER: 200.0000 mL | Freq: Four times a day (QID) | Status: DC

## 2018-10-12 MED ORDER — PANTOPRAZOLE SODIUM 40 MG PO TBEC
40.0000 mg | DELAYED_RELEASE_TABLET | Freq: Every day | ORAL | Status: DC
  Administered 2018-10-12: 40 mg via ORAL
  Filled 2018-10-12: qty 1

## 2018-10-12 MED ORDER — GABAPENTIN 600 MG PO TABS
300.0000 mg | ORAL_TABLET | Freq: Every day | ORAL | Status: DC
  Administered 2018-10-12 – 2018-10-15 (×4): 300 mg via ORAL
  Filled 2018-10-12 (×4): qty 1

## 2018-10-12 MED ORDER — FOLIC ACID 5 MG/ML IJ SOLN
1.0000 mg | Freq: Every day | INTRAMUSCULAR | Status: DC
  Filled 2018-10-12: qty 0.2

## 2018-10-12 MED ORDER — DEXTROSE 5 % IV SOLN
INTRAVENOUS | Status: DC
  Administered 2018-10-12: 09:00:00 via INTRAVENOUS

## 2018-10-12 MED ORDER — DOCUSATE SODIUM 100 MG PO CAPS
100.0000 mg | ORAL_CAPSULE | Freq: Two times a day (BID) | ORAL | Status: DC
  Administered 2018-10-12 – 2018-10-16 (×9): 100 mg via ORAL
  Filled 2018-10-12 (×9): qty 1

## 2018-10-12 MED ORDER — ISOSORBIDE DINITRATE 10 MG PO TABS
10.0000 mg | ORAL_TABLET | Freq: Two times a day (BID) | ORAL | Status: DC
  Administered 2018-10-12 – 2018-10-14 (×5): 10 mg via ORAL
  Filled 2018-10-12 (×5): qty 1

## 2018-10-12 MED ORDER — ATORVASTATIN CALCIUM 40 MG PO TABS
40.0000 mg | ORAL_TABLET | Freq: Every day | ORAL | Status: DC
  Administered 2018-10-12 – 2018-10-15 (×4): 40 mg via ORAL
  Filled 2018-10-12 (×4): qty 1

## 2018-10-12 MED ORDER — POTASSIUM CHLORIDE CRYS ER 20 MEQ PO TBCR
40.0000 meq | EXTENDED_RELEASE_TABLET | Freq: Two times a day (BID) | ORAL | Status: DC
  Administered 2018-10-12 – 2018-10-16 (×8): 40 meq via ORAL
  Filled 2018-10-12 (×9): qty 2

## 2018-10-12 MED ORDER — ASPIRIN 81 MG PO CHEW
81.0000 mg | CHEWABLE_TABLET | Freq: Every day | ORAL | Status: DC
  Administered 2018-10-12 – 2018-10-16 (×5): 81 mg via ORAL
  Filled 2018-10-12 (×5): qty 1

## 2018-10-12 MED ORDER — THIAMINE HCL 100 MG/ML IJ SOLN: 100.0000 mg | Freq: Every day | INTRAMUSCULAR | Status: DC

## 2018-10-12 MED ORDER — VITAMIN B-1 100 MG PO TABS
100.0000 mg | ORAL_TABLET | Freq: Every day | ORAL | Status: DC
  Administered 2018-10-12 – 2018-10-16 (×5): 100 mg via ORAL
  Filled 2018-10-12 (×5): qty 1

## 2018-10-12 MED ORDER — FOLIC ACID 1 MG PO TABS
1.0000 mg | ORAL_TABLET | Freq: Every day | ORAL | Status: DC
  Administered 2018-10-12 – 2018-10-16 (×5): 1 mg via ORAL
  Filled 2018-10-12 (×5): qty 1

## 2018-10-12 NOTE — Progress Notes (Signed)
Patient got very agitated whiles being bathed and went into a coughing spell , coughing out the ET tube by 4cm. Et at bedside and unsuccessfully attempted to advance tube. Patient able to talk and verbalizes  "please don't kill me". patient was reassured that he is being cared for  in the intensive care. Md on camera to assess patient.  Saturating 100% on non rebreather. Will monitor patient closely to assess need for reintubation.

## 2018-10-12 NOTE — Progress Notes (Addendum)
NAME:  [name], MRN:  [id number], DOB:  03/06/1956, LOS: 7 ADMISSION DATE:  10/05/2018, CONSULTATION DATE:  10/05/18 REFERRING MD:  Darl Householder CHIEF COMPLAINT:  SOB   Brief History   [name] is a 62 y.o. male who was admitted 11/6 with NSTEMI and acute hypoxic respiratory failure requiring intubation.  Past Medical History  Spinal stenosis, shizhophrenia, bipolar disorder, depression, anxiety, polysubstance abuse, rhabdo, PVD, DVT, HCV s/p treatment with Harvoni, diabetic neuropathy, DM.  Significant Hospital Events   11/6 > admit 11/8 > TEE/ LHC  Consults: date of consult/date signed off & final recs:  Cardiology 11/6 >   Procedures (surgical and bedside):  ETT 11/6 >   Significant Diagnostic Tests:  CXR 11/6 > cardiomegaly, small b/l effusions, b/l GGO's. Echo 11/7 > Reduced LV EF 40-45% (down from 60-65% on 08/2018). Appears mildly hypokinetic globally, with moderate hypokinesis of septal/inferior/lateral walls. G3DD. Moderate to severe MR, moderate TR. LE duplex 11/7 > neg TEE 11/8  >> prelim- > severe MR, no papillary muscle rupture noted LHC 11/8 >>  1. Severe stenosis of the left circumflex - small diffusely diseased vessels 2. Diffuse nonobstructive stenosis of the LAD and RCA 3. Widely patent left main 4. Severely elevated LVEDP 5. Moderate pulmonary HTN likely secondary to left heart failure (transpulmonic gradient 5-10 mmHg) 6. Severe MR by noninvasive assessment The circumflex/OM branches are borderline for PCI or grafting - targets appear 2 mm or less  Micro Data:  Blood 11/6 > ngtd Sputum 11/6 >  ng Urine 11/6 > neg MRSA PCR 11/6 >> neg  Antimicrobials:  Vanc 11/6 > 11/7 Cefepime 11/6 >   Subjective:  No acute events. -2.3L off. Down to 40% FiO2 and 8 PEEP.  Objective:  Blood pressure (!) 107/58, pulse 70, temperature 97.7 F (36.5 C), temperature source Axillary, resp. rate 12, height 5\' 11"  (1.803 m), weight 76.3 kg, SpO2 99 %. CVP:  [6  mmHg-17 mmHg] 6 mmHg  Vent Mode: PRVC FiO2 (%):  [40 %-100 %] 55 % Set Rate:  [18 bmp] 18 bmp Vt Set:  [600 [id number] mL] 600 mL PEEP:  [5 cmH20] 5 cmH20 Pressure Support:  [5 cmH20-8 cmH20] 5 cmH20 Plateau Pressure:  [18 cmH20-20 cmH20] 20 cmH20   Intake/Output Summary (Last 24 hours) at 10/12/2018 0829 Last data filed at 10/12/2018 7048 Gross per 24 hour  Intake 1995.13 ml  Output 5720 ml  Net -3724.87 ml   Filed Weights   10/10/18 0600 10/11/18 0214 10/12/18 0246  Weight: 83.6 kg 78.9 kg 76.3 kg    Examination:  General: Cachectic male who is heavily sedated HEENT: No lymphadenopathy or JVD appreciated Neuro: Heavily sedated not responsive to noxious stimuli CV: Heart sounds are regular PULM: even/non-labored, lungs bilaterally mild rhonchi GQ:BVQX, non-tender, bsx4 active  Extremities: warm/dry, negative edema  Skin: no rashes or lesions   Intake/Output Summary (Last 24 hours) at 10/12/2018 0829 Last data filed at 10/12/2018 4503 Gross per 24 hour  Intake 1995.13 ml  Output 5720 ml  Net -3724.87 ml   CBG (last 3)  Recent Labs    10/11/18 2327 10/11/18 2347 10/12/18 0334  GLUCAP 290* 282* 204*      Assessment & Plan:   NSTEMI with new severe MR and acute systolic HF. -Not a candidate for PCI questionable mitral clip in the future -Continue aspirin statin diuresis per cardiology  Acute hypoxic respiratory failure - Recurrent desaturation every time PEEP is lowered last few days. Self extubated a.m.  of 10/12/2018, remains off ventilator. -Stop sedation -Pulmonary toilet -Reintubated if necessary  Possible HCAP - cultures negative. -Completed antimicrobial therapy with Maxipime and on 10/11/2018  DM.  Note he is being placed on D5W 1113 this may require further adjustments in his insulin. -Small scale insulin protocol note glucose is coming down.-Not on oral medications at this time.  Since he self extubated will see if he can soon start taking p.o.  medications.  Mild AKI - resolved.. -Trend daily BMP and urine output.  Hypokalemia - s/p repletion. Hypernatremia most likely secondary to aggressive diuresis -Replete potassium as needed -Stop diuresis 10/12/2018 -Monitor sodium levels start D5W at 50 is been no longer have a way of giving free water.  Hx substance abuse (UDS on prior admits and this admit positive for cocaine), schizophrenia, bipolar, anxiety, depression. -Counseling once stabilized -Holding all narcotics at this time -Trazodone and risperidone as tolerated -Add thiamine and folic acid 77/82/4235   Disposition / Summary of Today's Plan 10/12/18    09/11/2018 he self extubated during the night.  Currently he is tolerating extubation.  We have discontinued his narcotics.  Precedex is being weaned off.  Start given him clear liquids to see if he can tolerate it advance diet as tolerated and start oral medications.  Added thiamine and folic acid for presumed alcohol use.  He has been aggressively diuresed and Intuniv 12 to 13 L over last 3 days with sodium rising greater than 150 therefore we will stop aggressive diuresis monitor his sodium and potassium.  He is actually improved condition since he self extubated.     Code Status: Full. Family Communication: 10/12/2018 no family at bedside  CC time: 35 min.   [name] ACNP [facility] PCCM Pager [phone] till 1 pm If no answer page [phone] 10/12/2018, 8:29 AM

## 2018-10-12 NOTE — Progress Notes (Signed)
RT NOTE:  RT called to assess self extubation. Pt "coughed ETT out" during agitated episode per RN. [facility] MD ok to leave patient on NRB, titrate to  as tolerated. If patient remains sleepy, collect ABG in 1 hour. RT & RN will monitor.

## 2018-10-12 NOTE — Progress Notes (Signed)
eLink Physician-Brief Progress Note Patient Name: [name] DOB: 07/11/1956 MRN: [id number]   Date of Service  10/12/2018  HPI/Events of Note  Patient inadvertently self-extubated.  He was talkng through the tube prior and was just given Versed on top Fentanyl and Precedex  eICU Interventions   Patient lethargic but responsive  Suction secretions as needed  If still lethargic in the next hour or so to get ABG and assess if re-intubation would be needed     Intervention Category Major Interventions: Airway management  Judd Lien 10/12/2018, 2:47 AM

## 2018-10-12 NOTE — Progress Notes (Addendum)
Patient ID: [name], male   DOB: [birth year], 62 y.o.   MRN: [id number]     Advanced Heart Failure Rounding Note  PCP-Cardiologist: No primary care provider on file.   Subjective:    Self extubated this am. On 4 L [place].   Brisk diuresis with -3.8 L off on 80 mg IV lasix TID. Weight down 4 more lbs (26 lbs total). CVP not working this am. Coox 63%. Creatinine 1.22 > 1.4. K 3.5, Mag 2.5, Na 151  Tmax 101.5 overnight. WBC 12.3. Off abx. Hemoglobin 8.6 > 8.3 > 9.4 >10.0.   He has a productive cough with brown sputum. Disoriented and agitated this morning. Asking for something to drink.   LHC/RHC 11/8:  Severe LCx system disease RA mean 12 PA 52/25 PCWP mean 27 LVEDP 32 CI 3.34  TEE 11/8: EF 35-40%, inf HK, severe central MR with no papillary muscle rupture or primary MV disease.   CXR today: looks about the same.  Objective:   Weight Range: 76.3 kg Body mass index is 23.46 kg/m.   Vital Signs:   Temp:  [97.7 F (36.5 C)-101.5 F (38.6 C)] 97.7 F (36.5 C) (11/13 0400) Pulse Rate:  [60-105] 70 (11/13 0800) Resp:  [12-29] 12 (11/13 0800) BP: (99-138)/(54-80) 107/58 (11/13 0800) SpO2:  [91 %-100 %] 99 % (11/13 0800) FiO2 (%):  [40 %-100 %] 55 % (11/13 0415) Weight:  [76.3 kg] 76.3 kg (11/13 0246) Last BM Date: (PTA)  Weight change: Filed Weights   10/10/18 0600 10/11/18 0214 10/12/18 0246  Weight: 83.6 kg 78.9 kg 76.3 kg    Intake/Output:   Intake/Output Summary (Last 24 hours) at 10/12/2018 0825 Last data filed at 10/12/2018 0807 Gross per 24 hour  Intake 1995.13 ml  Output 5720 ml  Net -3724.87 ml      Physical Exam    General: Intubated and sedated HEENT: + ETT Neck: Supple. JVP 5-6. Carotids 2+ bilat; no bruits. No thyromegaly or nodule noted. Cor: PMI nondisplaced. RRR, soft HSM at apex Lungs: Diminished basilar sounds Abdomen: Soft, non-tender, non-distended, no HSM. No bruits or masses. +BS  Extremities: No cyanosis, clubbing, or rash.  Trace ankle edema. LUE PICC line.  Neuro: Intubated and sedated.   Telemetry   NSR 60-70s. Personally reviewed.   Labs    CBC Recent Labs    10/11/18 0417 10/12/18 0326  WBC 10.3 12.8*  NEUTROABS 6.8 8.4*  HGB 9.4* 10.0*  HCT 30.3* 32.9*  MCV 98.4 99.1  PLT 179 967   Basic Metabolic Panel Recent Labs    10/11/18 0417 10/11/18 1319 10/12/18 0326  NA 146* 150* 151*  K 3.6 3.8 3.5  CL 104 112* 108  CO2 34* 34* 35*  GLUCOSE 239* 218* 213*  BUN 30* 27* 36*  CREATININE 1.22 1.17 1.40*  CALCIUM 9.6 8.9 9.9  MG 2.3  --  2.5*  PHOS 3.1  --  4.1   Liver Function Tests No results for input(s): AST, ALT, ALKPHOS, BILITOT, PROT, ALBUMIN in the last 72 hours. No results for input(s): LIPASE, AMYLASE in the last 72 hours. Cardiac Enzymes No results for input(s): CKTOTAL, CKMB, CKMBINDEX, TROPONINI in the last 72 hours.  BNP: BNP (last 3 results) Recent Labs    10/05/18 1436  BNP 779.2*    ProBNP (last 3 results) No results for input(s): PROBNP in the last 8760 hours.   D-Dimer No results for input(s): DDIMER in the last 72 hours. Hemoglobin A1C No results for input(s):  HGBA1C in the last 72 hours. Fasting Lipid Panel No results for input(s): CHOL, HDL, LDLCALC, TRIG, CHOLHDL, LDLDIRECT in the last 72 hours. Thyroid Function Tests No results for input(s): TSH, T4TOTAL, T3FREE, THYROIDAB in the last 72 hours.  Invalid input(s): FREET3  Other results:   Imaging    No results found.   Medications:     Scheduled Medications: . aspirin  81 mg Per Tube Daily  . atorvastatin  40 mg Per Tube q1800  . benztropine  1 mg Per Tube BID  . chlorhexidine gluconate (MEDLINE KIT)  15 mL Mouth Rinse BID  . Chlorhexidine Gluconate Cloth  6 each Topical Daily  . docusate  100 mg Per Tube BID  . feeding supplement (PRO-STAT SUGAR FREE 64)  30 mL Per Tube BID  . heparin  5,000 Units Subcutaneous Q8H  . hydrALAZINE  10 mg Per Tube Q8H  . insulin aspart  0-20 Units  Subcutaneous Q4H  . insulin glargine  5 Units Subcutaneous BID  . ipratropium-albuterol  3 mL Nebulization Once  . isosorbide dinitrate  10 mg Per Tube BID  . mouth rinse  15 mL Mouth Rinse 10 times per day  . pantoprazole sodium  40 mg Per Tube QHS  . potassium chloride  40 mEq Per Tube BID  . risperiDONE  1 mg Per Tube BID  . sodium chloride flush  10-40 mL Intracatheter Q12H  . sodium chloride flush  3 mL Intravenous Q12H  . spironolactone  25 mg Per Tube Daily  . traZODone  75 mg Per Tube QHS    Infusions: . sodium chloride 10 mL/hr at 10/12/18 0807  . sodium chloride Stopped (10/11/18 0817)  . dexmedetomidine (PRECEDEX) IV infusion 0.3 mcg/kg/hr (10/12/18 0807)  . feeding supplement (VITAL 1.5 CAL) Stopped (10/12/18 0230)  . fentaNYL infusion INTRAVENOUS Stopped (10/12/18 0240)    PRN Medications: sodium chloride, sodium chloride, acetaminophen (TYLENOL) oral liquid 160 mg/5 mL, bisacodyl, fentaNYL, midazolam, sodium chloride flush, sodium chloride flush    Patient Profile   62 y/o male with spinal stenosis, schizophrenia, bipolar disorder, polysubstance abuse, PAD, h/o DVT, HCV s/p treatment with Harvoni, DM with diabetic neuropathy,recent rhabdo admitted on 11/6 with NSTEMI (Peak trop 26.2) and respiratory failure requiring intubation.  Assessment/Plan   1. CAD: NSTEMI.  Severe diffuse disease in LCx system not amenable to PCI or CABG.  Medical management. No change. - Continue ASA 325, atorvastatin 40 daily.  2. Acute systolic CHF: TEE with EF 35-40% and severe central mitral regurgitation.  High filling pressures on RHC with preserved cardiac output. CVP not working this am. Coox 63% - Severe MR likely plays a role in CHF/respiratory failure. Continue hydral 10 mg TID/isosorbide dinitrate 10 mg BID (unable to crush imdur). SBP 100-120s 3. Acute hypoxemic respiratory failure: Suspect pulmonary edema +/- aspiration.  - Cefepime finished 11/11. Tmax 101.5 overnight. WBC  12.8 - Self extubated this am. Now on 4 L [place]. Has a productive cough. May need to add back coverage for PNA. Per primary. 4. Polysubstance abuse: Long-standing. No change.  5. Mitral regurgitation: Secondary, infarct-related (LCx territory). No papillary muscle rupture.  At this point, plan medical management of acute CHF.  Not surgical candidate.  Mitraclip may be consideration down the road but probable pure secondary MR makes difficult at this time. Expect to improve with diuresis. No change.  6. Schizophrenia.  - Agitated this am. Takes IM invega monthly at home (last dose 10/21). 7. Anemia - Hemoglobin 9.4 > 10.0.  8. Hypokalemia - K 3.5. Has 40  Meq BID scheduled.  9. NSVT - K 3.5, mag 2.5 10. Hypernatremia - Stop diuresis. May need FWF.   Length of Stay: [place], NP  10/12/2018, 8:25 AM  Advanced Heart Failure Team Pager [phone] (M-F; 7a - 4p)  Please contact [facility] for night-coverage after hours (4p -7a ) and weekends on [email or web address]  Patient seen and examined with the above-signed Advanced Practice Provider and/or Housestaff. I personally reviewed laboratory data, imaging studies and relevant notes. I independently examined the patient and formulated the important aspects of the plan. I have edited the note to reflect any of my changes or salient points. I have personally discussed the plan with the patient and/or family.  He self extubated overnight. Now awake and alert. CVP low. Co-ox 63%. Serum sodium 151. Will hold diuretics. Allow him to drink ad lib today. Will continue hydral/nitrates. t\Tirate as tolerated. No b-blocker yet may be able to add soon. Would repeat echo tomorrow. Consider ARB prior to d/c.   Glori Bickers, MD  1:29 PM

## 2018-10-12 NOTE — Procedures (Signed)
Extubation Procedure Note  Pt self-extubated during bath. RN stated pt "coughed ETT out." [facility] MD aware and monitoring.   Patient Details:   Name: [name] DOB: 1956-07-28 MRN: [id number]   Airway Documentation:    Vent end date: 10/12/18 Vent end time: 0235   Evaluation  O2 sats: stable throughout Complications: No apparent complications Patient did tolerate procedure well. Bilateral Breath Sounds: Rhonchi   Yes  Marissa Nestle 10/12/2018, 3:21 AM

## 2018-10-13 ENCOUNTER — Inpatient Hospital Stay (HOSPITAL_COMMUNITY): Payer: Medicare Other

## 2018-10-13 DIAGNOSIS — I340 Nonrheumatic mitral (valve) insufficiency: Secondary | ICD-10-CM

## 2018-10-13 LAB — GLUCOSE, CAPILLARY
GLUCOSE-CAPILLARY: 206 mg/dL — AB (ref 70–99)
Glucose-Capillary: 162 mg/dL — ABNORMAL HIGH (ref 70–99)
Glucose-Capillary: 163 mg/dL — ABNORMAL HIGH (ref 70–99)
Glucose-Capillary: 177 mg/dL — ABNORMAL HIGH (ref 70–99)
Glucose-Capillary: 180 mg/dL — ABNORMAL HIGH (ref 70–99)
Glucose-Capillary: 190 mg/dL — ABNORMAL HIGH (ref 70–99)
Glucose-Capillary: 211 mg/dL — ABNORMAL HIGH (ref 70–99)

## 2018-10-13 LAB — BASIC METABOLIC PANEL
ANION GAP: 8 (ref 5–15)
BUN: 24 mg/dL — ABNORMAL HIGH (ref 8–23)
CO2: 32 mmol/L (ref 22–32)
Calcium: 9.5 mg/dL (ref 8.9–10.3)
Chloride: 104 mmol/L (ref 98–111)
Creatinine, Ser: 1.06 mg/dL (ref 0.61–1.24)
GFR calc Af Amer: >60 mL/min (ref >60)
GLUCOSE: 181 mg/dL — AB (ref 70–99)
POTASSIUM: 3.9 mmol/L (ref 3.5–5.1)
Sodium: 144 mmol/L (ref 135–145)

## 2018-10-13 LAB — CBC WITH DIFFERENTIAL/PLATELET
ABS IMMATURE GRANULOCYTES: 0.08 10*3/uL — AB (ref 0.00–0.07)
BASOS ABS: 0.1 10*3/uL (ref 0.0–0.1)
Basophils Relative: 1 %
Eosinophils Absolute: 0.6 10*3/uL — ABNORMAL HIGH (ref 0.0–0.5)
Eosinophils Relative: 6 %
HEMATOCRIT: 34.8 % — AB (ref 39.0–52.0)
HEMOGLOBIN: 10.4 g/dL — AB (ref 13.0–17.0)
IMMATURE GRANULOCYTES: 1 %
LYMPHS ABS: 3.1 10*3/uL (ref 0.7–4.0)
Lymphocytes Relative: 28 %
MCH: 29.1 pg (ref 26.0–34.0)
MCHC: 29.9 g/dL — ABNORMAL LOW (ref 30.0–36.0)
MCV: 97.2 fL (ref 80.0–100.0)
Monocytes Absolute: 0.9 10*3/uL (ref 0.1–1.0)
Monocytes Relative: 8 %
NEUTROS PCT: 56 %
Neutro Abs: 6.2 10*3/uL (ref 1.7–7.7)
Platelets: 206 10*3/uL (ref 150–400)
RBC: 3.58 MIL/uL — AB (ref 4.22–5.81)
RDW: 12.2 % (ref 11.5–15.5)
WBC: 10.9 10*3/uL — ABNORMAL HIGH (ref 4.0–10.5)
nRBC: 0 % (ref 0.0–0.2)

## 2018-10-13 LAB — MAGNESIUM: MAGNESIUM: 2.2 mg/dL (ref 1.7–2.4)

## 2018-10-13 LAB — COOXEMETRY PANEL
CARBOXYHEMOGLOBIN: 1.8 % — AB (ref 0.5–1.5)
Carboxyhemoglobin: 1.8 % — ABNORMAL HIGH (ref 0.5–1.5)
METHEMOGLOBIN: 1.1 % (ref 0.0–1.5)
Methemoglobin: 1.8 % — ABNORMAL HIGH (ref 0.0–1.5)
O2 Saturation: 49.2 %
O2 Saturation: 68.7 %
TOTAL HEMOGLOBIN: 10.5 g/dL — AB (ref 12.0–16.0)
Total hemoglobin: 10.7 g/dL — ABNORMAL LOW (ref 12.0–16.0)

## 2018-10-13 LAB — ECHOCARDIOGRAM COMPLETE
HEIGHTINCHES: 71 in
WEIGHTICAEL: 2828.94 [oz_av]

## 2018-10-13 LAB — PHOSPHORUS: PHOSPHORUS: 3.7 mg/dL (ref 2.5–4.6)

## 2018-10-13 MED ORDER — INSULIN ASPART 100 UNIT/ML ~~LOC~~ SOLN
0.0000 [IU] | Freq: Three times a day (TID) | SUBCUTANEOUS | Status: DC
  Administered 2018-10-13 (×2): 7 [IU] via SUBCUTANEOUS
  Administered 2018-10-14: 4 [IU] via SUBCUTANEOUS
  Administered 2018-10-14: 3 [IU] via SUBCUTANEOUS
  Administered 2018-10-14: 7 [IU] via SUBCUTANEOUS
  Administered 2018-10-14: 3 [IU] via SUBCUTANEOUS
  Administered 2018-10-15: 4 [IU] via SUBCUTANEOUS
  Administered 2018-10-15: 7 [IU] via SUBCUTANEOUS
  Administered 2018-10-15: 3 [IU] via SUBCUTANEOUS
  Administered 2018-10-15 – 2018-10-16 (×2): 4 [IU] via SUBCUTANEOUS
  Administered 2018-10-16: 3 [IU] via SUBCUTANEOUS

## 2018-10-13 MED ORDER — TRAZODONE HCL 50 MG PO TABS: 75.0000 mg | ORAL_TABLET | Freq: Every day | ORAL | Status: DC

## 2018-10-13 MED ORDER — LOSARTAN POTASSIUM 25 MG PO TABS
12.5000 mg | ORAL_TABLET | Freq: Every evening | ORAL | Status: DC
  Administered 2018-10-13 – 2018-10-15 (×3): 12.5 mg via ORAL
  Filled 2018-10-13 (×4): qty 1

## 2018-10-13 MED ORDER — HYDRALAZINE HCL 10 MG PO TABS
10.0000 mg | ORAL_TABLET | Freq: Three times a day (TID) | ORAL | Status: DC
  Administered 2018-10-13 – 2018-10-14 (×3): 10 mg via ORAL
  Filled 2018-10-13 (×3): qty 1

## 2018-10-13 MED ORDER — PRO-STAT SUGAR FREE PO LIQD: 30.0000 mL | Freq: Two times a day (BID) | ORAL | Status: DC

## 2018-10-13 MED ORDER — ACETAMINOPHEN 160 MG/5ML PO SOLN: 650.0000 mg | ORAL | Status: DC | PRN

## 2018-10-13 MED ORDER — ENSURE ENLIVE PO LIQD
237.0000 mL | Freq: Every day | ORAL | Status: DC
  Administered 2018-10-13 – 2018-10-15 (×3): 237 mL via ORAL

## 2018-10-13 MED ORDER — DULOXETINE HCL 60 MG PO CPEP
60.0000 mg | ORAL_CAPSULE | Freq: Two times a day (BID) | ORAL | Status: DC
  Administered 2018-10-13 – 2018-10-16 (×7): 60 mg via ORAL
  Filled 2018-10-13 (×7): qty 1

## 2018-10-13 MED ORDER — BENZTROPINE MESYLATE 1 MG PO TABS
1.0000 mg | ORAL_TABLET | Freq: Two times a day (BID) | ORAL | Status: DC
  Administered 2018-10-13 – 2018-10-16 (×6): 1 mg via ORAL
  Filled 2018-10-13 (×6): qty 1

## 2018-10-13 MED ORDER — TRAZODONE HCL 100 MG PO TABS
100.0000 mg | ORAL_TABLET | Freq: Every day | ORAL | Status: DC
  Administered 2018-10-13 – 2018-10-15 (×3): 100 mg via ORAL
  Filled 2018-10-13 (×3): qty 1

## 2018-10-13 NOTE — Progress Notes (Signed)
  Echocardiogram 2D Echocardiogram has been performed.  Darlina Sicilian M 10/13/2018, 11:46 AM

## 2018-10-13 NOTE — Progress Notes (Signed)
Nutrition Follow-up  DOCUMENTATION CODES:   Not applicable  INTERVENTION:   Ensure Enlive po daily to meet nutritional needs, each supplement provides 350 kcal and 20 grams of protein  NUTRITION DIAGNOSIS:   Inadequate oral intake related to inability to eat as evidenced by NPO status.  Improving as diet advanced post extubation, good appetite, po 75%  GOAL:   Patient will meet greater than or equal to 90% of their needs  Progressing  MONITOR:   PO intake, Labs, Weight trends, I & O's  REASON FOR ASSESSMENT:   Consult Enteral/tube feeding initiation and management  ASSESSMENT:   62 year old male who presented to the ED on 11/6 with SOB. PMH significant for schizophrenia, PVD, spinal stenosis, type 2 diabetes mellitus, and substance abuse. Pt was intubated in the ED and was found to have pulmonary edema secondary to NSTEMI. TEE shows new severe MR and systolic dysfunction.  11/06 Admitted, Intubated 11/08 TEE EF 35-40% 11/13 Extubated  Recorded po intake 75% at breakfast this AM. Pt reporting good appetite at present.   Net negative 12 L since admission. Current wt 80.2 kg. Weight of 89 kg on admission; of note weight yesterday 76 kg  Hypernatremia improved  Labs: reviewed Meds: folic acid, ss novolog, lantus, KCl, thiamine  Diet Order:   Diet Order            Diet heart healthy/carb modified Room service appropriate? Yes; Fluid consistency: Thin  Diet effective now              EDUCATION NEEDS:   Not appropriate for education at this time  Skin:  Skin Assessment: Reviewed RN Assessment  Last BM:  11/13  Height:   Ht Readings from Last 1 Encounters:  10/05/18 5\' 11"  (1.803 m)    Weight:   Wt Readings from Last 1 Encounters:  10/13/18 80.2 kg    Ideal Body Weight:  78.18 kg  BMI:  Body mass index is 24.66 kg/m.  Estimated Nutritional Needs:   Kcal:  2000-2400 kcals   Protein:  100-120 g   Fluid:  >/= 2 L   Kerman Passey MS, RD,  LDN, CNSC [phone] Pager  [phone] Weekend/On-Call Pager

## 2018-10-13 NOTE — Progress Notes (Addendum)
Patient ID: [name], male   DOB: [birth year], 62 y.o.   MRN: [id number]     Advanced Heart Failure Rounding Note  PCP-Cardiologist: No primary care provider on file.   Subjective:    Self extubated 11/13. On 5 L [place]. Required venti-mask overnight.  Lasix stopped yesterday. Started on D5W @ 50 mls/hr. Na 151 > 144. Net positive 270 mls. CVP 2. Weights seem inaccurate. Creatinine 1.06. K 3.9, Mag 2.2. Coox 49%.  Afebrile overnight. WBC 10.9. Off abx. Hemoglobin stable 10.4  SBP 90-100s.  Feels hungry and thirsty. Wants to get OOB. Denies dizziness or SOB. No orthopnea or PND. Still has productive cough, but sputum lighter. Afebrile . Asking for trazodone for sleep.   LHC/RHC 11/8:  Severe LCx system disease RA mean 12 PA 52/25 PCWP mean 27 LVEDP 32 CI 3.34  TEE 11/8: EF 35-40%, inf HK, severe central MR with no papillary muscle rupture or primary MV disease.   CXR today: looks mostly unchanged.  Objective:   Weight Range: 80.2 kg Body mass index is 24.66 kg/m.   Vital Signs:   Temp:  [96.1 F (35.6 C)-98.8 F (37.1 C)] 97.3 F (36.3 C) (11/14 0350) Pulse Rate:  [25-108] 67 (11/14 0700) Resp:  [12-36] 23 (11/14 0600) BP: (91-132)/(46-70) 96/70 (11/14 0700) SpO2:  [62 %-100 %] 99 % (11/14 0700) FiO2 (%):  [45 %] 45 % (11/14 0600) Weight:  [80.2 kg] 80.2 kg (11/14 0500) Last BM Date: 10/12/18  Weight change: Filed Weights   10/11/18 0214 10/12/18 0246 10/13/18 0500  Weight: 78.9 kg 76.3 kg 80.2 kg    Intake/Output:   Intake/Output Summary (Last 24 hours) at 10/13/2018 0751 Last data filed at 10/13/2018 0500 Gross per 24 hour  Intake 1922.29 ml  Output 1655 ml  Net 267.29 ml      Physical Exam   General: Sitting up eating breakfast. Weak appearing  No resp difficulty. HEENT: Normal anicteric Neck: Supple. JVP flat. Carotids 2+ bilat; no bruits. No thyromegaly or nodule noted. Cor: PMI nondisplaced. RRR, soft HSM at apex Lungs: CTAB, normal  effort. No wheeze Abdomen: Soft, non-tender, non-distended, no HSM. No bruits or masses. +BS  Extremities: No cyanosis, clubbing, or rash. R and LLE no edema. Healed sores.  LUE PICC line Neuro: alert & oriented x 3, cranial nerves grossly intact. moves all 4 extremities w/o difficulty. Affect pleasant    Telemetry   NSR 60-70s. Personally reviewed.   Labs    CBC Recent Labs    10/12/18 0326 10/13/18 0449  WBC 12.8* 10.9*  NEUTROABS 8.4* 6.2  HGB 10.0* 10.4*  HCT 32.9* 34.8*  MCV 99.1 97.2  PLT 196 992   Basic Metabolic Panel Recent Labs    10/12/18 0326 10/13/18 0449  NA 151* 144  K 3.5 3.9  CL 108 104  CO2 35* 32  GLUCOSE 213* 181*  BUN 36* 24*  CREATININE 1.40* 1.06  CALCIUM 9.9 9.5  MG 2.5* 2.2  PHOS 4.1 3.7   Liver Function Tests No results for input(s): AST, ALT, ALKPHOS, BILITOT, PROT, ALBUMIN in the last 72 hours. No results for input(s): LIPASE, AMYLASE in the last 72 hours. Cardiac Enzymes No results for input(s): CKTOTAL, CKMB, CKMBINDEX, TROPONINI in the last 72 hours.  BNP: BNP (last 3 results) Recent Labs    10/05/18 1436  BNP 779.2*    ProBNP (last 3 results) No results for input(s): PROBNP in the last 8760 hours.   D-Dimer No results for input(s):  DDIMER in the last 72 hours. Hemoglobin A1C No results for input(s): HGBA1C in the last 72 hours. Fasting Lipid Panel No results for input(s): CHOL, HDL, LDLCALC, TRIG, CHOLHDL, LDLDIRECT in the last 72 hours. Thyroid Function Tests No results for input(s): TSH, T4TOTAL, T3FREE, THYROIDAB in the last 72 hours.  Invalid input(s): FREET3  Other results:   Imaging    No results found.   Medications:     Scheduled Medications: . aspirin  81 mg Oral Daily  . atorvastatin  40 mg Oral q1800  . benztropine  1 mg Per Tube BID  . chlorhexidine gluconate (MEDLINE KIT)  15 mL Mouth Rinse BID  . Chlorhexidine Gluconate Cloth  6 each Topical Daily  . docusate sodium  100 mg Oral BID    . feeding supplement (PRO-STAT SUGAR FREE 64)  30 mL Per Tube BID  . folic acid  1 mg Oral Daily  . gabapentin  300 mg Oral QHS  . heparin  5,000 Units Subcutaneous Q8H  . hydrALAZINE  10 mg Per Tube Q8H  . insulin aspart  0-20 Units Subcutaneous Q4H  . insulin glargine  5 Units Subcutaneous BID  . ipratropium-albuterol  3 mL Nebulization Once  . isosorbide dinitrate  10 mg Oral BID  . pantoprazole  40 mg Oral QHS  . potassium chloride  40 mEq Oral BID  . risperiDONE  1 mg Per Tube BID  . sodium chloride flush  10-40 mL Intracatheter Q12H  . spironolactone  25 mg Oral Daily  . thiamine  100 mg Oral Daily  . traZODone  75 mg Per Tube QHS    Infusions: . sodium chloride Stopped (10/11/18 0817)  . dexmedetomidine (PRECEDEX) IV infusion 0.8 mcg/kg/hr (10/13/18 0220)  . feeding supplement (VITAL 1.5 CAL) Stopped (10/12/18 0230)    PRN Medications: sodium chloride, acetaminophen (TYLENOL) oral liquid 160 mg/5 mL, bisacodyl, sodium chloride flush    Patient Profile   62 y/o male with spinal stenosis, schizophrenia, bipolar disorder, polysubstance abuse, PAD, h/o DVT, HCV s/p treatment with Harvoni, DM with diabetic neuropathy,recent rhabdo admitted on 11/6 with NSTEMI (Peak trop 26.2) and respiratory failure requiring intubation.  Assessment/Plan   1. CAD: NSTEMI.  Severe diffuse disease in LCx system not amenable to PCI or CABG.  Medical management. No s/s ischemia.  - Continue ASA 325, atorvastatin 40 daily.  2. Acute systolic CHF: TEE with EF 35-40% and severe central mitral regurgitation.  High filling pressures on RHC with preserved cardiac output. CVP 2 Coox 49%. Recheck 69% pending. May need to give more IVF. Encouraged PO intake.  - Severe MR likely plays a role in CHF/respiratory failure. Continue hydral 10 mg TID/isosorbide dinitrate 10 mg BID (unable to crush imdur). SBP 90-100s. Will not increase today.  - Continue spiro 25 mg daily - Add losartan 12.5 mg qHS - May be  able to add beta blocker soon. Hold off with coox 49%. Recheck coox.  - Repeat echo today.  3. Acute hypoxemic respiratory failure: Suspect pulmonary edema +/- aspiration.  - Cefepime finished 11/11. Afebrile overnight. WBC 10.9 - Self extubated this am. Now on 5 L [place]. No change.  4. Polysubstance abuse: Long-standing. No change.   5. Mitral regurgitation: Secondary, infarct-related (LCx territory). No papillary muscle rupture.  At this point, plan medical management of acute CHF.  Not surgical candidate.  Mitraclip may be consideration down the road but probable pure secondary MR makes difficult at this time. Expect to improve with diuresis. Repeat echo  today.  6. Schizophrenia.  - Takes IM invega monthly at home (last dose 10/21). Has PRN risperdol 7. Anemia - Hemoglobin 9.4 > 10.0 > 10.4.  8. Hypokalemia - K improved. 3.9 9. NSVT - K 3.9, mag 2.2 10. Hypernatremia - Na 151 > 144. Lasix held and he is on D5W @ 50 mls/hr.    Length of Stay: [place], NP  10/13/2018, 7:51 AM  Advanced Heart Failure Team Pager [phone] (M-F; 7a - 4p)  Please contact [facility] for night-coverage after hours (4p -7a ) and weekends on [email or web address]  Patient seen and examined with the above-signed Advanced Practice Provider and/or Housestaff. I personally reviewed laboratory data, imaging studies and relevant notes. I independently examined the patient and formulated the important aspects of the plan. I have edited the note to reflect any of my changes or salient points. I have personally discussed the plan with the patient and/or family.  He is improving slowly. CVP low and co-ox ok but required FM O2 last night. Serum sodium better. Will stop D5. Continue to hold lasix. Continue spiro. Add low-dose losartan. Continue abx. Echo reviewed personally.  EF 55% moderate MR. Can transfer to floor. CR to see. No b-blocker yet. Continue DAPT and statin.   Glori Bickers, MD  5:57 PM

## 2018-10-13 NOTE — Progress Notes (Signed)
NAME:  [name], MRN:  [id number], DOB:  [birth year], LOS: 8 ADMISSION DATE:  10/05/2018, CONSULTATION DATE:  10/05/18 REFERRING MD:  Darl Householder CHIEF COMPLAINT:  SOB   Brief History   [name] is a 62 y.o. male who was admitted 11/6 with NSTEMI and acute hypoxic respiratory failure requiring intubation.  Past Medical History  Spinal stenosis, shizhophrenia, bipolar disorder, depression, anxiety, polysubstance abuse, rhabdo, PVD, DVT, HCV s/p treatment with Harvoni, diabetic neuropathy, DM.  Significant Hospital Events   11/6 > admit w/ resp failure felt mix of pulmonary edema and ? Aspiration PNA in setting of NSTEMI 11/8 > TEE/ LHC 11/13 self extubated. Diuresis held due to rising creatinine  11/14 off oxygen. Hemodynamic stable ready for tele  Consults: date of consult/date signed off & final recs:  Cardiology 11/6 >   Procedures (surgical and bedside):  ETT 11/6 >   Significant Diagnostic Tests:  CXR 11/6 > cardiomegaly, small b/l effusions, b/l GGO's. Echo 11/7 > Reduced LV EF 40-45% (down from 60-65% on 08/2018). Appears mildly hypokinetic globally, with moderate hypokinesis of septal/inferior/lateral walls. G3DD. Moderate to severe MR, moderate TR. LE duplex 11/7 > neg TEE 11/8  >> prelim- > severe MR, no papillary muscle rupture noted LHC 11/8 >>  1. Severe stenosis of the left circumflex - small diffusely diseased vessels 2. Diffuse nonobstructive stenosis of the LAD and RCA 3. Widely patent left main 4. Severely elevated LVEDP 5. Moderate pulmonary HTN likely secondary to left heart failure (transpulmonic gradient 5-10 mmHg) 6. Severe MR by noninvasive assessment The circumflex/OM branches are borderline for PCI or grafting - targets appear 2 mm or less  Micro Data:  Blood 11/6 > ngtd Sputum 11/6 >  ng Urine 11/6 > neg MRSA PCR 11/6 >> neg  Antimicrobials:  Vanc 11/6 > 11/7 Cefepime 11/6 > 11/13  Subjective:  No distress Not on pressors Calm    Objective:  Blood pressure (Abnormal) 93/59, pulse 81, temperature (Abnormal) 97.4 F (36.3 C), temperature source Oral, resp. rate (Abnormal) 32, height 5\' 11"  (1.803 m), weight 80.2 kg, SpO2 100 %. CVP:  [1 mmHg-9 mmHg] 1 mmHg  FiO2 (%):  [45 %] 45 %   Intake/Output Summary (Last 24 hours) at 10/13/2018 1016 Last data filed at 10/13/2018 1000 Gross per 24 hour  Intake 1765.52 ml  Output 1830 ml  Net -64.48 ml   Filed Weights   10/11/18 0214 10/12/18 0246 10/13/18 0500  Weight: 78.9 kg 76.3 kg 80.2 kg    Examination:  General calm 62 year old aam resting in bed HENT NCAT no JVD MMM pulm scattered rhonchi that improve w/ cough  Card RRR w/out MRG abd not tender + bowel sounds  gu voids  Neuro currently intact  gu clear yellow Ext no edema brisk cr    CBG (last 3)   Resolved issue   AKI 11/13 HCAP completed abx 11/12 Acute Hypoxic respiratory failure (self 11/13)   Assessment & Plan:   NSTEMI with new severe MR and acute systolic HF. -Not a candidate for PCI questionable mitral clip in the future Plan Cont tele  Cont asa and plavix Cont hydral tid, isosorbide bid F/u repeat echo  Fluid and electrolyte imbalance: hypernatremia (improved), hypokalemia (resolved) Plan  Trend chemistry   Hx substance abuse (UDS on prior admits and this admit positive for cocaine), schizophrenia, bipolar, anxiety, depression. -he is on long acting IM antipsychotics via injection Plan Cont cogentin Resume cymbalta Cont trazodone at current for  now 75mg /hs  No change in thiamine and folate.  Needs f/u w/ psych   DM Plan ssi   Disposition / Summary of Today's Plan 10/13/18   To tele

## 2018-10-14 LAB — COOXEMETRY PANEL
CARBOXYHEMOGLOBIN: 1.8 % — AB (ref 0.5–1.5)
METHEMOGLOBIN: 1.8 % — AB (ref 0.0–1.5)
O2 Saturation: 58.3 %
Total hemoglobin: 10.3 g/dL — ABNORMAL LOW (ref 12.0–16.0)

## 2018-10-14 LAB — BASIC METABOLIC PANEL
ANION GAP: 10 (ref 5–15)
BUN: 18 mg/dL (ref 8–23)
CALCIUM: 9.1 mg/dL (ref 8.9–10.3)
CO2: 27 mmol/L (ref 22–32)
Chloride: 104 mmol/L (ref 98–111)
Creatinine, Ser: 1.17 mg/dL (ref 0.61–1.24)
GFR calc Af Amer: >60 mL/min (ref >60)
Glucose, Bld: 173 mg/dL — ABNORMAL HIGH (ref 70–99)
Potassium: 4.2 mmol/L (ref 3.5–5.1)
SODIUM: 141 mmol/L (ref 135–145)

## 2018-10-14 LAB — GLUCOSE, CAPILLARY
GLUCOSE-CAPILLARY: 151 mg/dL — AB (ref 70–99)
GLUCOSE-CAPILLARY: 155 mg/dL — AB (ref 70–99)
GLUCOSE-CAPILLARY: 138 mg/dL — AB (ref 70–99)
GLUCOSE-CAPILLARY: 238 mg/dL — AB (ref 70–99)

## 2018-10-14 NOTE — Progress Notes (Signed)
PROGRESS NOTE    [name]  JFH:[id number] DOB: 10-28-1956 DOA: 10/05/2018 PCP: Nolene Ebbs, MD   Brief Narrative: 62 year old male, spinal stenosis, PVD, hx of DVT, type 2 diabetes, chronic back pain, schizophrenia/anxiety & depression/bipolar disorder , history of hepatitis C that was treated with Harvoni in 2017, presented to ER on 10/05/18 with shortness and chest pain for 1-2 days.  He was having exertional and resting shortness of breath several days prior to that.  His symptoms began at home and lasted for several hours, EMS was called and brought to the ER where he was somnolent.  Patient was recently discharged on 09/08/2018 from hospital related to rhabdomyolysis myopathy questionable pneumonia small pericardial effusion.  In the ER EKG showed sinus tachycardia, troponin elevated at 4.2, BNP 779, moderate bilateral interstitial and groundglass opacity which may reflect pulmonary edema or diffuse pneumonia, cardiomegaly.  UDS was positive with cocaine, patient was hypoxic and in respiratory distress, failed BiPAP, was intubated and transferred to ICU. His echo showed global hypokinesia EF 40 to 45% with moderate to severe MR, cardiac cath with severe LCX system disease, not a surgical candidate.TTE 10/07/18 w LVEF 35 to 40%.He was diuresed. He self extubated 10/12/2018.  He is followed by cardiology/ heartfailure service. TTE repeated 09/1418 report pending. Patient transferred to [facility] service 10/14/18.   Assessment & Plan:   Non-STEMI:s/p cardiac cath with severe LCX system disease, not a surgical candidate.  By cardiology.  Continue on aspirin, Imdur,  Acute pulmonary edema w acute systolic congestive heart failure: TTE 10/07/18 w LVEF 35 to 40%.He was diuresed but lasix stopped and placed on ivf due to low cvp in icu.He is followed by cardiology/ heartfailure service. TTE repeated 09/1418 report with improved  "LV EF: 55% - 65%". So far total negative balance 13.6 l.  We will try to  cut back on his meds due to soft bp, he is on hydralazine, Imdur- stopping them as EF is normalr. Cont  losartan and aldactone and monitor BP.   Acute hypoxic respiratory failure: improved. S/p self extubation. Cont nebs.  Possible aspiration pneumonia: Clinically stable, afebrile, no significant leukocytosis.  Completed cefepime course.last dose 10/10/18.  Severe mitral regurgitation: eval by card appreciated.  Plan is to continue medical management of CHF with the hope that MR will improve, he is not a surgical candidate, MitraClip could be considered in future.He will need to follow-up with cardiology.   Diabetes mellitus. hba1c 5.1 in 10/2017. Sugar well controlled, on Lantus 5 units daily with sliding scale insulin.  Continue the same. Cont  Neurontin for his neuropathy. On IM invega monthly.  Mild Acute kidney injury: resolved.  NSVT: Electrolytes are stable.  Monitor and replete.  Hypernatremia: Improved  Polysubstance abuse cocaine positive: Cessation advised.  High risk for readmissions and decompensation due to his substance abuse.  Schizophrenial/bipolar disorder/anxiety and depression, with chronic back pain. Mood is stable. Continue his Neurontin, trazodone, Cymbalta.  DVT prophylaxis: heparin Code Status: FULL Family Communication: No family at bedside Disposition Plan: Home soon, 1-2 days once okay w cardio  Consultants:  Cardiology-heart failure Pulm/critical  Procedures: LHC cath "1. Severe stenosis of the left circumflex - small diffusely diseased vessels 2. Diffuse nonobstructive stenosis of the LAD and RCA 3. Widely patent left main 4. Severely elevated LVEDP 5. Moderate pulmonary HTN likely secondary to left heart failure (transpulmonic gradient 5-10 mmHg) 6. Severe MR by noninvasive assessment The circumflex/OM branches are borderline for PCI or grafting - targets appear 2 mm  or less"  Antimicrobials: Cefepime last date 10/10/18  Subjective: Patient  seen this morning sitting on the bedside chair.  Lightheaded on rapid standing.  Blood pressure is soft and heart rate slightly tachycardic. On room air.  No chest pain.  No shortness of breath.  Objective: Vitals:   10/13/18 1939 10/14/18 0326 10/14/18 0330 10/14/18 0515  BP: (!) 110/56 (!) 96/58  (!) 96/57  Pulse: 93 (!) 107    Resp: (!) 23     Temp: 99 F (37.2 C) 99.3 F (37.4 C)    TempSrc: Oral Oral    SpO2: 98% 98%    Weight:   79.2 kg   Height:        Intake/Output Summary (Last 24 hours) at 10/14/2018 0836 Last data filed at 10/14/2018 0403 Gross per 24 hour  Intake 1810 ml  Output 3535 ml  Net -1725 ml   Filed Weights   10/12/18 0246 10/13/18 0500 10/14/18 0330  Weight: 76.3 kg 80.2 kg 79.2 kg   Weight change: -1.002 kg  Body mass index is 24.35 kg/m.  Examination:  General exam: Appears calm and comfortable ,Not in distress,average built HEENT:PERRL,Oral mucosa moist, Ear/Nose normal on gross exam Respiratory system: Bilateral equal air entry, normal vesicular breath sounds, no wheezes or crackles  Cardiovascular system: S1 & S2 heard,No JVD, murmurs.No pedal edema. Gastrointestinal system: Abdomen is  soft, non tender, non distended, BS +  Nervous System:Alert and oriented. No focal neurological deficits/moving extremities. Extremities: No edema, no clubbing,distal peripheral pulses palpable. Skin: No rashes, lesions,no icterus MSK: Normal muscle bulk,tone ,power  Data Reviewed: I have personally reviewed following labs and imaging studies  CBC: Recent Labs  Lab 10/09/18 0204 10/10/18 0418 10/11/18 0417 10/12/18 0326 10/13/18 0449  WBC 9.2 8.6 10.3 12.8* 10.9*  NEUTROABS  --   --  6.8 8.4* 6.2  HGB 8.6* 8.3* 9.4* 10.0* 10.4*  HCT 27.5* 26.7* 30.3* 32.9* 34.8*  MCV 97.9 98.2 98.4 99.1 97.2  PLT 146* 151 179 196 998   Basic Metabolic Panel: Recent Labs  Lab 10/09/18 0204  10/10/18 0418 10/11/18 0417 10/11/18 1319 10/12/18 0326  10/13/18 0449 10/14/18 0459  NA 145   < > 145 146* 150* 151* 144 141  K 3.4*   < > 3.1* 3.6 3.8 3.5 3.9 4.2  CL 111   < > 107 104 112* 108 104 104  CO2 28   < > 31 34* 34* 35* 32 27  GLUCOSE 199*   < > 183* 239* 218* 213* 181* 173*  BUN 26*   < > 24* 30* 27* 36* 24* 18  CREATININE 1.10   < > 1.06 1.22 1.17 1.40* 1.06 1.17  CALCIUM 8.6*   < > 8.9 9.6 8.9 9.9 9.5 9.1  MG 2.0  --  2.0 2.3  --  2.5* 2.2  --   PHOS 2.8  --  2.9 3.1  --  4.1 3.7  --    < > = values in this interval not displayed.   GFR: Estimated Creatinine Clearance: 70.6 mL/min (by C-G formula based on SCr of 1.17 mg/dL). Liver Function Tests: Recent Labs  Lab 10/08/18 0607  ALBUMIN 2.5*   No results for input(s): LIPASE, AMYLASE in the last 168 hours. No results for input(s): AMMONIA in the last 168 hours. Coagulation Profile: No results for input(s): INR, PROTIME in the last 168 hours. Cardiac Enzymes: No results for input(s): CKTOTAL, CKMB, CKMBINDEX, TROPONINI in the last 168 hours. BNP (last  3 results) No results for input(s): PROBNP in the last 8760 hours. HbA1C: No results for input(s): HGBA1C in the last 72 hours. CBG: Recent Labs  Lab 10/13/18 0751 10/13/18 1210 10/13/18 1605 10/13/18 2131 10/14/18 0740  GLUCAP 177* 180* 206* 211* 151*   Lipid Profile: No results for input(s): CHOL, HDL, LDLCALC, TRIG, CHOLHDL, LDLDIRECT in the last 72 hours. Thyroid Function Tests: No results for input(s): TSH, T4TOTAL, FREET4, T3FREE, THYROIDAB in the last 72 hours. Anemia Panel: No results for input(s): VITAMINB12, FOLATE, FERRITIN, TIBC, IRON, RETICCTPCT in the last 72 hours. Sepsis Labs: No results for input(s): PROCALCITON, LATICACIDVEN in the last 168 hours.  Recent Results (from the past 240 hour(s))  MRSA PCR Screening     Status: None   Collection Time: 10/05/18  9:42 PM  Result Value Ref Range Status   MRSA by PCR NEGATIVE NEGATIVE Final    Comment:        The GeneXpert MRSA Assay  (FDA approved for NASAL specimens only), is one component of a comprehensive MRSA colonization surveillance program. It is not intended to diagnose MRSA infection nor to guide or monitor treatment for MRSA infections. Performed at [facility], [place] [address], [place], [place] [address]   Culture, blood (routine x 2)     Status: None   Collection Time: 10/05/18  9:46 PM  Result Value Ref Range Status   Specimen Description BLOOD LEFT ANTECUBITAL  Final   Special Requests   Final    BOTTLES DRAWN AEROBIC ONLY Blood Culture adequate volume   Culture   Final    NO GROWTH 5 DAYS Performed at [facility], [place] [address], [place], [place] [address]    Report Status 10/10/2018 FINAL  Final  Culture, blood (routine x 2)     Status: None   Collection Time: 10/05/18  9:52 PM  Result Value Ref Range Status   Specimen Description BLOOD LEFT HAND  Final   Special Requests   Final    BOTTLES DRAWN AEROBIC ONLY Blood Culture adequate volume   Culture   Final    NO GROWTH 5 DAYS Performed at [facility], [place] [address], [place], [place] [address]    Report Status 10/10/2018 FINAL  Final  Culture, respiratory (tracheal aspirate)     Status: None   Collection Time: 10/05/18 11:10 PM  Result Value Ref Range Status   Specimen Description TRACHEAL ASPIRATE  Final   Special Requests NONE  Final   Gram Stain   Final    ABUNDANT WBC PRESENT,BOTH PMN AND MONONUCLEAR RARE GRAM POSITIVE COCCI Performed at [facility], [place] [address] [place], [place] [address]    Culture FEW MULTIPLE ORGANISMS PRESENT, NONE PREDOMINANT  Final   Report Status 10/08/2018 FINAL  Final  Urine culture     Status: None   Collection Time: 10/06/18 12:27 AM  Result Value Ref Range Status   Specimen Description URINE, CATHETERIZED  Final   Special Requests NONE  Final   Culture   Final    NO GROWTH Performed at [facility], [place] [address], [place],  [address]    Report  Status 10/07/2018 FINAL  Final      Radiology Studies: Dg Chest Port 1 View  Result Date: 10/13/2018 CLINICAL DATA:  Respiratory failure. EXAM: PORTABLE CHEST 1 VIEW COMPARISON:  10/12/2018 FINDINGS: Persistent airspace densities in the mid and lower lungs bilaterally. The lung densities have not significantly changed.  Heart size remains upper limits of normal and stable. Left arm PICC line tip in the SVC and stable. 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 Negative for a pneumothorax. Surgical hardware in the lower cervical spine. IMPRESSION: Stable chest radiographic findings with no change in the bilateral airspace disease. Electronically Signed   By: Markus Daft M.D.   On: 10/13/2018 08:36     Scheduled Meds: . aspirin  81 mg Oral Daily  . atorvastatin  40 mg Oral q1800  . benztropine  1 mg Oral BID  . Chlorhexidine Gluconate Cloth  6 each Topical Daily  . docusate sodium  100 mg Oral BID  . DULoxetine  60 mg Oral BID  . feeding supplement (ENSURE ENLIVE)  237 mL Oral Q1500  . folic acid  1 mg Oral Daily  . gabapentin  300 mg Oral QHS  . heparin  5,000 Units Subcutaneous Q8H  . hydrALAZINE  10 mg Oral Q8H  . insulin aspart  0-20 Units Subcutaneous TID AC & HS  . insulin glargine  5 Units Subcutaneous BID  . ipratropium-albuterol  3 mL Nebulization Once  . isosorbide dinitrate  10 mg Oral BID  . losartan  12.5 mg Oral QPM  . potassium chloride  40 mEq Oral BID  . spironolactone  25 mg Oral Daily  . thiamine  100 mg Oral Daily  . traZODone  100 mg Oral QHS   Continuous Infusions: . sodium chloride Stopped (10/11/18 0817)     LOS: 9 days   Time spent: More than 50% of that time was spent in counseling and/or coordination of care.  Antonieta Pert, MD [facility] Pager [phone]  If 7PM-7AM, please contact night-coverage [email or web address] Password [facility] 10/14/2018, 8:36 AM

## 2018-10-14 NOTE — Progress Notes (Addendum)
Patient ID: [name], male   DOB: 1956/04/13, 62 y.o.   MRN: [id number]     Advanced Heart Failure Rounding Note  PCP-Cardiologist: No primary care provider on file.   Subjective:    Self extubated 11/13. On 5 L [place]. Required venti-mask overnight.  Had been on D5W @ 50 mls/hr for HyperNa. Na 151 > 144 -> 141. Now stopped.   Net negative 1.5 L. CVP removed from room. Weight 174.6 today, but have been inaccurate. Coox 58.4%.  Afebrile. Tmax 99. Off abx. Hemoglobin stable 10.4  SBP 90-100s.  Feeling OK this am. Remains lightheaded with rapid standing. SOB much improved. Now off O2.   LHC/RHC 11/8:  Severe LCx system disease RA mean 12 PA 52/25 PCWP mean 27 LVEDP 32 CI 3.34  TEE 11/8: EF 35-40%, inf HK, severe central MR with no papillary muscle rupture or primary MV disease.   CXR 10/13/18: looks mostly unchanged with persistent, bilateral airspace densities.   Objective:   Weight Range: 79.2 kg Body mass index is 24.35 kg/m.   Vital Signs:   Temp:  [97.9 F (36.6 C)-99.3 F (37.4 C)] 99.3 F (37.4 C) (11/15 0326) Pulse Rate:  [77-107] 107 (11/15 0326) Resp:  [14-26] 23 (11/14 1939) BP: (96-112)/(54-74) 96/57 (11/15 0515) SpO2:  [94 %-100 %] 98 % (11/15 0326) Weight:  [79.2 kg] 79.2 kg (11/15 0330) Last BM Date: 10/12/18  Weight change: Filed Weights   10/12/18 0246 10/13/18 0500 10/14/18 0330  Weight: 76.3 kg 80.2 kg 79.2 kg    Intake/Output:   Intake/Output Summary (Last 24 hours) at 10/14/2018 0847 Last data filed at 10/14/2018 0403 Gross per 24 hour  Intake 1810 ml  Output 3535 ml  Net -1725 ml      Physical Exam   General:No resp difficulty. Weak appearing.  HEENT: Normal Neck: Supple. JVP flat. Carotids 2+ bilat; no bruits. No thyromegaly or nodule noted. Cor: PMI nondisplaced. Regular, tachy, Soft HSM at apex.  Lungs: CTAB, normal effort. Abdomen: Soft, non-tender, non-distended, no HSM. No bruits or masses. +BS  Extremities: No  cyanosis, clubbing, or rash. Sores in multiple stages of healing. LUE PICC line  Neuro: Alert & orientedx3, cranial nerves grossly intact. moves all 4 extremities w/o difficulty. Affect pleasant   Telemetry   Sinus tach 100-110s, personally reviewed.   Labs    CBC Recent Labs    10/12/18 0326 10/13/18 0449  WBC 12.8* 10.9*  NEUTROABS 8.4* 6.2  HGB 10.0* 10.4*  HCT 32.9* 34.8*  MCV 99.1 97.2  PLT 196 557   Basic Metabolic Panel Recent Labs    10/12/18 0326 10/13/18 0449 10/14/18 0459  NA 151* 144 141  K 3.5 3.9 4.2  CL 108 104 104  CO2 35* 32 27  GLUCOSE 213* 181* 173*  BUN 36* 24* 18  CREATININE 1.40* 1.06 1.17  CALCIUM 9.9 9.5 9.1  MG 2.5* 2.2  --   PHOS 4.1 3.7  --    Liver Function Tests No results for input(s): AST, ALT, ALKPHOS, BILITOT, PROT, ALBUMIN in the last 72 hours. No results for input(s): LIPASE, AMYLASE in the last 72 hours. Cardiac Enzymes No results for input(s): CKTOTAL, CKMB, CKMBINDEX, TROPONINI in the last 72 hours.  BNP: BNP (last 3 results) Recent Labs    10/05/18 1436  BNP 779.2*    ProBNP (last 3 results) No results for input(s): PROBNP in the last 8760 hours.   D-Dimer No results for input(s): DDIMER in the last 72 hours.  Hemoglobin A1C No results for input(s): HGBA1C in the last 72 hours. Fasting Lipid Panel No results for input(s): CHOL, HDL, LDLCALC, TRIG, CHOLHDL, LDLDIRECT in the last 72 hours. Thyroid Function Tests No results for input(s): TSH, T4TOTAL, T3FREE, THYROIDAB in the last 72 hours.  Invalid input(s): FREET3  Other results:   Imaging    No results found.   Medications:     Scheduled Medications: . aspirin  81 mg Oral Daily  . atorvastatin  40 mg Oral q1800  . benztropine  1 mg Oral BID  . Chlorhexidine Gluconate Cloth  6 each Topical Daily  . docusate sodium  100 mg Oral BID  . DULoxetine  60 mg Oral BID  . feeding supplement (ENSURE ENLIVE)  237 mL Oral Q1500  . folic acid  1 mg Oral  Daily  . gabapentin  300 mg Oral QHS  . heparin  5,000 Units Subcutaneous Q8H  . hydrALAZINE  10 mg Oral Q8H  . insulin aspart  0-20 Units Subcutaneous TID AC & HS  . insulin glargine  5 Units Subcutaneous BID  . ipratropium-albuterol  3 mL Nebulization Once  . isosorbide dinitrate  10 mg Oral BID  . losartan  12.5 mg Oral QPM  . potassium chloride  40 mEq Oral BID  . spironolactone  25 mg Oral Daily  . thiamine  100 mg Oral Daily  . traZODone  100 mg Oral QHS    Infusions: . sodium chloride Stopped (10/11/18 0817)    PRN Medications: sodium chloride, acetaminophen (TYLENOL) oral liquid 160 mg/5 mL, bisacodyl, sodium chloride flush    Patient Profile   62 y/o male with spinal stenosis, schizophrenia, bipolar disorder, polysubstance abuse, PAD, h/o DVT, HCV s/p treatment with Harvoni, DM with diabetic neuropathy,recent rhabdo admitted on 11/6 with NSTEMI (Peak trop 26.2) and respiratory failure requiring intubation.  Assessment/Plan   1. CAD: NSTEMI.  Severe diffuse disease in LCx system not amenable to PCI or CABG.  Medical management.  - No s/s of ischemia.    - Continue ASA 325, atorvastatin 40 daily.  2. Acute systolic CHF: TEE with EF 35-40% and severe central mitral regurgitation.  High filling pressures on RHC with preserved cardiac output. CVP 2 Coox 49%. Recheck 69% pending. May need to give more IVF. Encouraged PO intake.  - Severe MR likely plays a role in CHF/respiratory failure. - Echo 10/13/18 with LVEF 55-60%, Moderate MR.  - Volume status stable. - Coox 58.3% this am. Holding on BB for now.  - Stop hydral/imdur with normal EF and soft pressures.  - Continue spiro 25 mg daily - Continue losartan 12.5 mg qHS - Echo 10/13/18 with LVEF 55-60%, Moderate MR.  3. Acute hypoxemic respiratory failure: Suspect pulmonary edema +/- aspiration.  - Cefepime finished 11/11. Tmax 99.3. WBC 10.9 10/13/18. - Self extubated 10/12/18 am. Now off oxygen.  4. Polysubstance  abuse: Long-standing. No change.    5. Mitral regurgitation: Secondary, infarct-related (LCx territory). No papillary muscle rupture.  At this point, plan medical management of acute CHF.  Not surgical candidate.  Mitraclip may be consideration down the road but probable pure secondary MR makes difficult at this time. Expect to improve with diuresis.  - Echo 10/13/18 with LVEF 55-60%, Moderate MR.  6. Schizophrenia.  - Takes IM invega monthly at home (last dose 10/21). Has PRN risperdol. Per primary.  7. Anemia - Hemoglobin 9.4 > 10.0 > 10.4 10/13/18. 8. Hypokalemia - K 4.2. Stable.  9. NSVT - K 4.2.  Mg 2.2 10/13/18.  10. Hypernatremia - Na 151 > 144 -> 141 - D5W stopped.   Improving. Will discuss rate control with MD. Will arrange follow up.   Length of Stay: [place], [place]  10/14/2018, 8:47 AM  Advanced Heart Failure Team Pager [phone] (M-F; 7a - 4p)  Please contact [facility] for night-coverage after hours (4p -7a ) and weekends on [email or web address]  Patient seen and examined with the above-signed Advanced Practice Provider and/or Housestaff. I personally reviewed laboratory data, imaging studies and relevant notes. I independently examined the patient and formulated the important aspects of the plan. I have edited the note to reflect any of my changes or salient points. I have personally discussed the plan with the patient and/or family.  He looks and feels much better today. Volume status looks good. BP remains low. With normal EF will stop hydralazine and nitrates. Continue spiro and losartan. No b-blocker yet. Continue ASA and statin. CR and PT to see. Will likely need to add lasix back soon.   Glori Bickers, MD  12:26 PM

## 2018-10-14 NOTE — Progress Notes (Signed)
Inpatient Diabetes Program Recommendations  AACE/ADA: New Consensus Statement on Inpatient Glycemic Control (2015)  Target Ranges:  Prepandial:   less than 140 mg/dL      Peak postprandial:   less than 180 mg/dL (1-2 hours)      Critically ill patients:  140 - 180 mg/dL   Lab Results  Component Value Date   GLUCAP 151 (H) 10/14/2018   HGBA1C 5.1 11/27/2017    Review of Glycemic Control Results for MALIN, CERVINI (MRN [id number]) as of 10/14/2018 11:06  Ref. Range 10/13/2018 16:05 10/13/2018 21:31 10/14/2018 07:40  Glucose-Capillary Latest Ref Range: 70 - 99 mg/dL 206 (H) 211 (H) 151 (H)   Diabetes history: DM 2 Outpatient Diabetes medications: Metformin 1000 mg bid, Januvia 100 mg q AM Current orders for Inpatient glycemic control:  Novolog resistant TID + HS, Lantus 5 units bid Inpatient Diabetes Program Recommendations:     Consider increasing Lantus to 10 units bid.  Thanks, Bronson Curb, MSN, RNC-OB Diabetes Coordinator [phone] (8a-5p)

## 2018-10-15 DIAGNOSIS — E119 Type 2 diabetes mellitus without complications: Secondary | ICD-10-CM

## 2018-10-15 LAB — BASIC METABOLIC PANEL
Anion gap: 8 (ref 5–15)
BUN: 13 mg/dL (ref 8–23)
CO2: 24 mmol/L (ref 22–32)
CREATININE: 1.12 mg/dL (ref 0.61–1.24)
Calcium: 9 mg/dL (ref 8.9–10.3)
Chloride: 108 mmol/L (ref 98–111)
GFR calc Af Amer: >60 mL/min (ref >60)
GFR calc non Af Amer: >60 mL/min (ref >60)
Glucose, Bld: 251 mg/dL — ABNORMAL HIGH (ref 70–99)
POTASSIUM: 4.9 mmol/L (ref 3.5–5.1)
Sodium: 140 mmol/L (ref 135–145)

## 2018-10-15 LAB — GLUCOSE, CAPILLARY
Glucose-Capillary: 199 mg/dL — ABNORMAL HIGH (ref 70–99)
Glucose-Capillary: 160 mg/dL — ABNORMAL HIGH (ref 70–99)
Glucose-Capillary: 211 mg/dL — ABNORMAL HIGH (ref 70–99)
Glucose-Capillary: 127 mg/dL — ABNORMAL HIGH (ref 70–99)

## 2018-10-15 LAB — COOXEMETRY PANEL
Carboxyhemoglobin: 2.2 % — ABNORMAL HIGH (ref 0.5–1.5)
Methemoglobin: 1.1 % (ref 0.0–1.5)
O2 SAT: 86.7 %
TOTAL HEMOGLOBIN: 9.3 g/dL — AB (ref 12.0–16.0)

## 2018-10-15 MED ORDER — CLOPIDOGREL BISULFATE 75 MG PO TABS
75.0000 mg | ORAL_TABLET | Freq: Every day | ORAL | Status: DC
  Administered 2018-10-15 – 2018-10-16 (×2): 75 mg via ORAL
  Filled 2018-10-15 (×2): qty 1

## 2018-10-15 NOTE — Progress Notes (Signed)
PROGRESS NOTE  [name] FKC:[id number] DOB: 07/10/1956 DOA: 10/05/2018 PCP: Nolene Ebbs, MD  Brief Narrative: 62 year old man presented with NSTEMI, acute hypoxic respiratory failure, required intubation in the emergency department.  TTE revealed severe ischemic mitral valve regurgitation.  TEE revealed systolic dysfunction, severe mitral regurgitation without complicating features.  Patient underwent cardiac catheterization, medical management recommended.  Patient was seen by cardiothoracic surgery who recommended medical management for mitral valve disease.  Hospitalization was prolonged by respiratory failure requiring prolonged intubation but patient was eventually extubated and transferred to hospitalist service.  Assessment/Plan NSTEMI, severe mitral regurgitation, diffuse coronary artery disease.  Not a candidate for PCI or CABG. --Medical management with aspirin, clopidogrel, statin.  Acute systolic CHF with severe mitral regurgitation --Not a candidate for surgery or advanced therapies --Continue management per cardiology including Aldactone --Systolic function has recovered by most recent echocardiogram  Acute hypoxic respiratory failure secondary to NSTEMI --Resolved.  Diabetes mellitus type 2 --CBG stable.  Continue present management.  PMH cocaine abuse with UDS positive for cocaine October 2019.  Schizophrenia, bipolar disorder, anxiety, depression --Stable.  Continue present management.   Anticipate discharge when cleared by cardiology.  DVT prophylaxis: heparin Code Status: Full Family Communication: none Disposition Plan: likely home     Murray Hodgkins, MD  [facility] Direct contact: [phone] --Via [place]  --[email or web address]; password [email or web address]  7PM-7AM contact night coverage as above 10/15/2018, 5:40 PM  LOS: 10 days   Consultants:  Admitted by [facility]  Cardiology   Cardiothoracic surgery  Procedures:  TEE Mild LV dilatation  EF 35-40% inferior basal hypokinesis Severe central MR but no ruptured chords, no flail leaflet and no ruptured papillary muscle Normal RV No LAA thrombus No effusion Mild TR Normal AV  PICC 11/9  TTE 11/14 Study Conclusions  - Left ventricle: The cavity size was normal. Wall thickness was   increased in a pattern of mild LVH. Systolic function was normal.   The estimated ejection fraction was in the range of 55% to 65%.   Moderate hypokinesis of the inferior myocardium. Features are   consistent with a pseudonormal left ventricular filling pattern,   with concomitant abnormal relaxation and increased filling   pressure (grade 2 diastolic dysfunction). - Mitral valve: There was moderate regurgitation.  Echo 11/7 Study Conclusions  - Left ventricle: The cavity size was mildly dilated. Systolic   function was mildly to moderately reduced. The estimated ejection   fraction was in the range of 40% to 45%. Diffuse mild   hypokinesis. Moderate hypokinesis of the anteroseptal, lateral,   inferolateral, inferior, and inferoseptal myocardium. Doppler   parameters are consistent with a reversible restrictive pattern,   indicative of decreased left ventricular diastolic compliance   and/or increased left atrial pressure (grade 3 diastolic   dysfunction). - Aortic valve: There was trivial regurgitation. - Mitral valve: There was moderate to severe regurgitation. - Left atrium: The atrium was moderately dilated. - Tricuspid valve: There was moderate regurgitation. - Pulmonary arteries: Systolic pressure was moderately increased.   PA peak pressure: 38 mm Hg (S). - Pericardium, extracardiac: A trivial pericardial effusion was   identified.  Impressions:  - Reduced LV EF. Appears mildly hypokinetic globally, with moderate   hypokinesis of septal/inferior/lateral walls. Grade 3 diastolic   dysfunction. Moderate to severe MR, moderate TR.  Antimicrobials:    Interval  history/Subjective: Feels fine, no complaints. No pain. Breathing fine.  Objective: Vitals:  Vitals:   10/15/18 0504 10/15/18 1459  BP: 113/64 107/70  Pulse: 90 97  Resp: 19 20  Temp: 98.6 F (37 C) 99.2 F (37.3 C)  SpO2: 96% 100%    Exam:  Constitutional:  . Appears calm and comfortable, sitting in chair. Respiratory:  . CTA bilaterally, no w/r/r.  . Respiratory effort normal.  Cardiovascular:  . RRR, no m/r/g . No ankle edema   Psychiatric:  . Mental status o Mood, affect appropriate  I have personally reviewed the following:   Data: . Basic metabolic panel unremarkable.  Scheduled Meds: . aspirin  81 mg Oral Daily  . atorvastatin  40 mg Oral q1800  . benztropine  1 mg Oral BID  . Chlorhexidine Gluconate Cloth  6 each Topical Daily  . clopidogrel  75 mg Oral Daily  . docusate sodium  100 mg Oral BID  . DULoxetine  60 mg Oral BID  . feeding supplement (ENSURE ENLIVE)  237 mL Oral Q1500  . folic acid  1 mg Oral Daily  . gabapentin  300 mg Oral QHS  . heparin  5,000 Units Subcutaneous Q8H  . insulin aspart  0-20 Units Subcutaneous TID AC & HS  . insulin glargine  5 Units Subcutaneous BID  . ipratropium-albuterol  3 mL Nebulization Once  . losartan  12.5 mg Oral QPM  . potassium chloride  40 mEq Oral BID  . spironolactone  25 mg Oral Daily  . thiamine  100 mg Oral Daily  . traZODone  100 mg Oral QHS   Continuous Infusions: . sodium chloride Stopped (10/11/18 0817)    Principal Problem:   Non-STEMI (non-ST elevated myocardial infarction) ([place]) Active Problems:   Bipolar I disorder with depression, severe ([facility])   Diabetes mellitus without complication ([place])   Cocaine abuse ([place])   Severe mitral regurgitation   LOS: 10 days

## 2018-10-15 NOTE — Progress Notes (Signed)
Patient ID: [name], male   DOB: [birth year], 62 y.o.   MRN: [id number]    PCP-Cardiologist: Bensimohn   Subjective:    No complaints wants to go home soon   Objective:   Weight Range: 78.8 kg Body mass index is 24.23 kg/m.   Vital Signs:   Temp:  [98.2 F (36.8 C)-98.9 F (37.2 C)] 98.6 F (37 C) (11/16 0504) Pulse Rate:  [90-92] 90 (11/16 0504) Resp:  [16-20] 19 (11/16 0504) BP: (113-115)/(56-64) 113/64 (11/16 0504) SpO2:  [96 %-100 %] 96 % (11/16 0504) Weight:  [78.8 kg] 78.8 kg (11/16 0504) Last BM Date: 10/12/18  Weight change: Filed Weights   10/13/18 0500 10/14/18 0330 10/15/18 0504  Weight: 80.2 kg 79.2 kg 78.8 kg    Intake/Output:   Intake/Output Summary (Last 24 hours) at 10/15/2018 0854 Last data filed at 10/15/2018 0506 Gross per 24 hour  Intake 600 ml  Output 4350 ml  Net -3750 ml      Physical Exam   More alert and interactive than when in CCU Chronically ill black male  HEENT: normal Neck supple with no adenopathy JVP normal no bruits no thyromegaly Lungs clear with no wheezing and good diaphragmatic motion Heart:  S1/S2 cannot appreciate MR murmur, no rub, gallop or click PMI normal Abdomen: benighn, BS positve, no tenderness, no AAA no bruit.  No HSM or HJR Distal pulses intact with no bruits No edema Neuro non-focal Skin warm and dry No muscular weakness   Telemetry   Sinus tach 100-110s, personally reviewed.   Labs    CBC Recent Labs    10/13/18 0449  WBC 10.9*  NEUTROABS 6.2  HGB 10.4*  HCT 34.8*  MCV 97.2  PLT 010   Basic Metabolic Panel Recent Labs    10/13/18 0449 10/14/18 0459 10/15/18 0545  NA 144 141 140  K 3.9 4.2 4.9  CL 104 104 108  CO2 32 27 24  GLUCOSE 181* 173* 251*  BUN 24* 18 13  CREATININE 1.06 1.17 1.12  CALCIUM 9.5 9.1 9.0  MG 2.2  --   --   PHOS 3.7  --   --    Liver Function Tests No results for input(s): AST, ALT, ALKPHOS, BILITOT, PROT, ALBUMIN in the last 72 hours. No  results for input(s): LIPASE, AMYLASE in the last 72 hours. Cardiac Enzymes No results for input(s): CKTOTAL, CKMB, CKMBINDEX, TROPONINI in the last 72 hours.  BNP: BNP (last 3 results) Recent Labs    10/05/18 1436  BNP 779.2*     Other results:   Imaging    No results found.   Medications:     Scheduled Medications: . aspirin  81 mg Oral Daily  . atorvastatin  40 mg Oral q1800  . benztropine  1 mg Oral BID  . Chlorhexidine Gluconate Cloth  6 each Topical Daily  . docusate sodium  100 mg Oral BID  . DULoxetine  60 mg Oral BID  . feeding supplement (ENSURE ENLIVE)  237 mL Oral Q1500  . folic acid  1 mg Oral Daily  . gabapentin  300 mg Oral QHS  . heparin  5,000 Units Subcutaneous Q8H  . insulin aspart  0-20 Units Subcutaneous TID AC & HS  . insulin glargine  5 Units Subcutaneous BID  . ipratropium-albuterol  3 mL Nebulization Once  . losartan  12.5 mg Oral QPM  . potassium chloride  40 mEq Oral BID  . spironolactone  25 mg Oral Daily  .  thiamine  100 mg Oral Daily  . traZODone  100 mg Oral QHS    Infusions: . sodium chloride Stopped (10/11/18 0817)    PRN Medications: sodium chloride, acetaminophen (TYLENOL) oral liquid 160 mg/5 mL, bisacodyl, sodium chloride flush    Patient Profile   62 y/o male with spinal stenosis, schizophrenia, bipolar disorder, polysubstance abuse, PAD, h/o DVT, HCV s/p treatment with Harvoni, DM with diabetic neuropathy,recent rhabdo admitted on 11/6 with NSTEMI (Peak trop 26.2) and respiratory failure requiring intubation.  Assessment/Plan   1. CAD: NSTEMI.  Severe diffuse disease in LCx system not amenable to PCI or CABG.  Medical management.  Turned down by surgery add plavix   2. Acute systolic CHF:  On admission had severe ischemic MR with EF 35-40% F//U TTE with improved EF and  Moderate MR continue aldactone Still with good diuresis without lasix   3. Acute hypoxemic respiratory failure:  Aspiration antibiotics done  11/11 off oxygen extubated 10/12/18   4. Polysubstance abuse: Long-standing. No change.     5. Schizophrenia.   Takes IM invega monthly at home (last dose 10/21). Has PRN risperdol. Per primary.    Length of Stay: 10  Jenkins Rouge, MD  10/15/2018, 8:54 AM

## 2018-10-15 NOTE — Evaluation (Signed)
Physical Therapy Evaluation Patient Details Name: [name] MRN: [id number] DOB: [birth year] Today's Date: 10/15/2018   History of Present Illness  61 y.o. male admitted for NSTEMI  with CHF and respiratory failure (self extubated 10/12/18),  was referred to PT.  Has mult admissions this year, with ongoing polysubstance abuse in setting of schizophrenia and cardiac disease.  PMHx:  rhabdomyolysis, PLIF July 2019 L5-S1, DM, EF 35-40%, severe mitral regurg,   Clinical Impression  Pt was assessed with new NSTEMI and noted baseline pulse with gait was 120, up as high as 128 on portable telemetry.  Pt is carefully pacing himself and noted no SOB or undue exertion.  He walks with good skill on RW to avoid obstacles and turn carefully and demonstrates good ability to use RW at home.  Will likely need to provide him one for home use.  Follow acutely for progression of safety with distances and to monitor vitals with all effort.    Follow Up Recommendations Home health PT;Supervision for mobility/OOB    Equipment Recommendations  Rolling walker with 5" wheels(if pt does not have a reasonable walker)    Recommendations for Other Services       Precautions / Restrictions Restrictions Weight Bearing Restrictions: No      Mobility  Bed Mobility               General bed mobility comments: up in chair upon PT arrival  Transfers Overall transfer level: Modified independent Equipment used: Rolling walker (2 wheeled)             General transfer comment: pt reached for walker upon standing but used chair to stand up on armrests  Ambulation/Gait Ambulation/Gait assistance: Min guard Gait Distance (Feet): 80 Feet Assistive device: Rolling walker (2 wheeled);1 person hand held assist Gait Pattern/deviations: Step-through pattern;Decreased stride length;Wide base of support;Trunk flexed Gait velocity: reduced Gait velocity interpretation: <1.31 ft/sec, indicative of household  ambulator General Gait Details: pt is careful to stay in borders of walker and attends to turning and obstacle clearance  Stairs            Wheelchair Mobility    Modified Rankin (Stroke Patients Only)       Balance Overall balance assessment: Needs assistance Sitting-balance support: Feet supported;Bilateral upper extremity supported Sitting balance-Leahy Scale: Good     Standing balance support: Bilateral upper extremity supported;During functional activity Standing balance-Leahy Scale: Fair Standing balance comment: depending on walker dynamically                             Pertinent Vitals/Pain Pain Assessment: No/denies pain    Home Living Family/patient expects to be discharged to:: Private residence Living Arrangements: Alone   Type of Home: House Home Access: Level entry     Home Layout: One level Home Equipment: Cane - single point Additional Comments: pt does not offer many details about his home situation    Prior Function Level of Independence: Independent with assistive device(s)         Comments: using SPC per pt     Hand Dominance   Dominant Hand: Right    Extremity/Trunk Assessment   Upper Extremity Assessment Upper Extremity Assessment: Overall WFL for tasks assessed    Lower Extremity Assessment Lower Extremity Assessment: Overall WFL for tasks assessed    Cervical / Trunk Assessment Cervical / Trunk Assessment: Normal  Communication   Communication: No difficulties  Cognition Arousal/Alertness: Awake/alert Behavior  During Therapy: Flat affect Overall Cognitive Status: No family/caregiver present to determine baseline cognitive functioning                                 General Comments: pt is speaking in limited ways until end of eval where he is looking for personal items that are not with him      General Comments General comments (skin integrity, edema, etc.): Pt is up to walk with  assistance and noted his ability to maneuver, pulses ranged from 120 to 128 during gait    Exercises     Assessment/Plan    PT Assessment Patient needs continued PT services  PT Problem List Decreased range of motion;Decreased activity tolerance;Decreased balance;Decreased mobility;Decreased coordination;Decreased knowledge of use of DME;Decreased safety awareness;Cardiopulmonary status limiting activity       PT Treatment Interventions DME instruction;Gait training;Functional mobility training;Therapeutic activities;Therapeutic exercise;Balance training;Neuromuscular re-education;Patient/family education    PT Goals (Current goals can be found in the Care Plan section)  Acute Rehab PT Goals Patient Stated Goal: to walk and find his personal items PT Goal Formulation: With patient Time For Goal Achievement: 10/29/18 Potential to Achieve Goals: Good    Frequency Min 3X/week   Barriers to discharge Decreased caregiver support home with no assistance per pt    Co-evaluation               AM-PAC PT "6 Clicks" Daily Activity  Outcome Measure Difficulty turning over in bed (including adjusting bedclothes, sheets and blankets)?: A Little Difficulty moving from lying on back to sitting on the side of the bed? : A Little Difficulty sitting down on and standing up from a chair with arms (e.g., wheelchair, bedside commode, etc,.)?: A Little Help needed moving to and from a bed to chair (including a wheelchair)?: A Little Help needed walking in hospital room?: A Little Help needed climbing 3-5 steps with a railing? : A Little 6 Click Score: 18    End of Session Equipment Utilized During Treatment: Gait belt Activity Tolerance: Patient tolerated treatment well;Patient limited by fatigue Patient left: in chair;with call bell/phone within reach;with chair alarm set Nurse Communication: Mobility status PT Visit Diagnosis: Unsteadiness on feet (R26.81);Difficulty in walking, not  elsewhere classified (R26.2)    Time: 5643-3295 PT Time Calculation (min) (ACUTE ONLY): 32 min   Charges:   PT Evaluation $PT Eval Moderate Complexity: 1 Mod PT Treatments $Gait Training: 8-22 mins       Ramond Dial 10/15/2018, 12:42 PM  Mee Hives, PT MS Acute Rehab Dept. Number: [place] and [place]

## 2018-10-16 DIAGNOSIS — F1410 Cocaine abuse, uncomplicated: Secondary | ICD-10-CM

## 2018-10-16 DIAGNOSIS — F314 Bipolar disorder, current episode depressed, severe, without psychotic features: Secondary | ICD-10-CM

## 2018-10-16 DIAGNOSIS — Z452 Encounter for adjustment and management of vascular access device: Secondary | ICD-10-CM

## 2018-10-16 DIAGNOSIS — I5023 Acute on chronic systolic (congestive) heart failure: Secondary | ICD-10-CM

## 2018-10-16 LAB — BASIC METABOLIC PANEL
ANION GAP: 9 (ref 5–15)
BUN: 12 mg/dL (ref 8–23)
CALCIUM: 9.7 mg/dL (ref 8.9–10.3)
CO2: 24 mmol/L (ref 22–32)
CREATININE: 1.04 mg/dL (ref 0.61–1.24)
Chloride: 108 mmol/L (ref 98–111)
GFR calc Af Amer: >60 mL/min (ref >60)
Glucose, Bld: 186 mg/dL — ABNORMAL HIGH (ref 70–99)
Potassium: 4.3 mmol/L (ref 3.5–5.1)
Sodium: 141 mmol/L (ref 135–145)

## 2018-10-16 LAB — COOXEMETRY PANEL
CARBOXYHEMOGLOBIN: 1.5 % (ref 0.5–1.5)
METHEMOGLOBIN: 1.8 % — AB (ref 0.0–1.5)
O2 SAT: 57.5 %
Total hemoglobin: 10.3 g/dL — ABNORMAL LOW (ref 12.0–16.0)

## 2018-10-16 LAB — GLUCOSE, CAPILLARY
Glucose-Capillary: 161 mg/dL — ABNORMAL HIGH (ref 70–99)
Glucose-Capillary: 129 mg/dL — ABNORMAL HIGH (ref 70–99)

## 2018-10-16 MED ORDER — ATORVASTATIN CALCIUM 40 MG PO TABS: 40.0000 mg | ORAL_TABLET | Freq: Every day | ORAL | 0 refills | Status: DC

## 2018-10-16 MED ORDER — SPIRONOLACTONE 25 MG PO TABS: 25.0000 mg | ORAL_TABLET | Freq: Every day | ORAL | 0 refills | Status: DC

## 2018-10-16 MED ORDER — DOCUSATE SODIUM 100 MG PO CAPS: 100.0000 mg | ORAL_CAPSULE | Freq: Two times a day (BID) | ORAL | 0 refills | Status: DC

## 2018-10-16 MED ORDER — LOSARTAN POTASSIUM 25 MG PO TABS: 12.5000 mg | ORAL_TABLET | Freq: Every evening | ORAL | 0 refills | Status: DC

## 2018-10-16 MED ORDER — CLOPIDOGREL BISULFATE 75 MG PO TABS: 75.0000 mg | ORAL_TABLET | Freq: Every day | ORAL | 0 refills | Status: DC

## 2018-10-16 MED ORDER — BISACODYL 10 MG RE SUPP: 10.0000 mg | Freq: Every day | RECTAL | 0 refills | Status: DC | PRN

## 2018-10-16 NOTE — Progress Notes (Signed)
Reviewed all d/c instructions with patient and copy of instructions given.  Verbalized understanding.  PICC removed by IV team and reviewed care of site.  RW delivered to patient and CM set up for [facility].  Patient d/ced with Grand-daughter.

## 2018-10-16 NOTE — Discharge Summary (Signed)
Physician Discharge Summary  [name]:[id number] DOB: 12-12-1955 DOA: 10/05/2018  PCP: Nolene Ebbs, MD  Admit date: 10/05/2018 Discharge date: 10/16/2018  Admitted From: home Disposition:  home   Recommendations for Outpatient Follow-up:  1. Bmet in1 wk  Home Health:  Ordered    Discharge Condition:  stable  CODE STATUS:  Full code   Diet recommendation:  Carb mod/ heart healthy diet Consultations:  Admitted by PCCM  Cardiology   Cardiothoracic surgery  Discharge Diagnoses:  Principal Problem:   Non-STEMI (non-ST elevated myocardial infarction) ([place]) Active Problems:   Acute on chronic systolic CHF (congestive heart failure) ([place])   Bipolar I disorder with depression, severe ([place])   Diabetes mellitus without complication (HCC)   Cocaine abuse ([place])   Severe mitral regurgitation   Schizophrenia, bipolar disorder, anxiety, depression    Brief Summary: 62 year old man presented with NSTEMI, acute hypoxic respiratory failure, required intubation in the emergency department.  TTE revealed severe ischemic mitral valve regurgitation.  TEE revealed systolic dysfunction, severe mitral regurgitation without complicating features.  Patient underwent cardiac catheterization, medical management recommended.  Patient was seen by cardiothoracic surgery who recommended medical management for mitral valve disease.  Hospitalization was prolonged by respiratory failure requiring prolonged intubation but patient was eventually extubated and transferred to hospitalist service  Hospital Course:  NSTEMI, severe mitral regurgitation, diffuse coronary artery disease in LCx system .  Not a candidate for PCI or CABG. --Medical management with aspirin, clopidogrel, statin.  Acute systolic CHF with severe mitral regurgitation - severe ischemic MR with EF 35-40% F//U TTE with improved EF and Moderate MR --Not a candidate for surgery or advanced therapies --Continue management per  cardiology> Aldactone- not on Lasix as he is diuresing well --Systolic function has recovered by most recent echocardiogram  Acute hypoxic respiratory failure secondary to NSTEMI, CHF and Aspiration PNA - antibiotics stopped on 11/11 --Resolved.  Diabetes mellitus type 2 --CBG stable.  Continue present management.  PMH cocaine abuse with UDS positive for cocaine October 2019.  Schizophrenia, bipolar disorder, anxiety, depression --Stable.  Continue present management> Invega and PRN Risperdol   Discharge Exam: Vitals:   10/16/18 0644 10/16/18 0729  BP: 116/75 104/64  Pulse:    Resp:  (!) 25  Temp:    SpO2:     Vitals:   10/15/18 1459 10/15/18 2107 10/16/18 0644 10/16/18 0729  BP: 107/70 118/66 116/75 104/64  Pulse: 97 85    Resp: 20 (!) 25  (!) 25  Temp: 99.2 F (37.3 C) 99 F (37.2 C)    TempSrc: Oral Oral    SpO2: 100% 99%    Weight:      Height:        General: Pt is alert, awake, not in acute distress Cardiovascular: RRR, S1/S2 +, no rubs, no gallops Respiratory: CTA bilaterally, no wheezing, no rhonchi Abdominal: Soft, NT, ND, bowel sounds + Extremities: no edema, no cyanosis   Discharge Instructions  Discharge Instructions    Diet - low sodium heart healthy   Complete by:  As directed    Diet Carb Modified   Complete by:  As directed    Increase activity slowly   Complete by:  As directed      Allergies as of 10/16/2018      Reactions   Penicillins Hives, Other (See Comments)   Has patient had a PCN reaction causing immediate rash, facial/tongue/throat swelling, SOB or lightheadedness with hypotension: No Has patient had a PCN reaction causing severe rash  involving mucus membranes or skin necrosis: No Has patient had a PCN reaction that required hospitalization No Has patient had a PCN reaction occurring within the last 10 years: No If all of the above answers are "NO", then may proceed with Cephalosporin use.      Medication List    STOP  taking these medications   diclofenac 75 MG EC tablet Commonly known as:  VOLTAREN   ferrous gluconate 324 MG tablet Commonly known as:  FERGON   ibuprofen 600 MG tablet Commonly known as:  ADVIL,MOTRIN   lidocaine 5 % Commonly known as:  LIDODERM   nicotine 7 mg/24hr patch Commonly known as:  NICODERM CQ - dosed in mg/24 hr   paliperidone 3 MG 24 hr tablet Commonly known as:  INVEGA   tiZANidine 4 MG tablet Commonly known as:  ZANAFLEX     TAKE these medications   atorvastatin 40 MG tablet Commonly known as:  LIPITOR Take 1 tablet (40 mg total) by mouth daily at 6 PM.   benztropine 1 MG tablet Commonly known as:  COGENTIN Take 1 mg by mouth 2 (two) times daily.   bisacodyl 10 MG suppository Commonly known as:  DULCOLAX Place 1 suppository (10 mg total) rectally daily as needed for moderate constipation.   cholecalciferol 1000 units tablet Commonly known as:  VITAMIN D Take 1,000 Units by mouth daily.   clopidogrel 75 MG tablet Commonly known as:  PLAVIX Take 1 tablet (75 mg total) by mouth daily. Start taking on:  10/17/2018   docusate sodium 100 MG capsule Commonly known as:  COLACE Take 1 capsule (100 mg total) by mouth 2 (two) times daily.   DULoxetine 60 MG capsule Commonly known as:  CYMBALTA Take 60 mg by mouth 2 (two) times daily.   finasteride 5 MG tablet Commonly known as:  PROSCAR Take 5 mg by mouth daily.   gabapentin 300 MG capsule Commonly known as:  NEURONTIN Take 1 capsule (300 mg total) by mouth at bedtime. What changed:  when to take this   INVEGA SUSTENNA 234 MG/1.5ML Susp injection Generic drug:  paliperidone Inject 234 mg into the muscle every 30 (thirty) days.   losartan 25 MG tablet Commonly known as:  COZAAR Take 0.5 tablets (12.5 mg total) by mouth every evening.   metFORMIN 1000 MG tablet Commonly known as:  GLUCOPHAGE Take 1,000 mg by mouth 2 (two) times daily with a meal.   polyethylene glycol packet Commonly known  as:  MIRALAX / GLYCOLAX Take 17 g by mouth daily.   sitaGLIPtin 100 MG tablet Commonly known as:  JANUVIA Take 100 mg by mouth daily with breakfast.   spironolactone 25 MG tablet Commonly known as:  ALDACTONE Take 1 tablet (25 mg total) by mouth daily. Start taking on:  10/17/2018   tamsulosin 0.4 MG Caps capsule Commonly known as:  FLOMAX Take 0.4 mg by mouth daily after supper.   traZODone 150 MG tablet Commonly known as:  DESYREL Take 0.5 tablets (75 mg total) by mouth at bedtime. What changed:  how much to take       Allergies  Allergen Reactions  . Penicillins Hives and Other (See Comments)    Has patient had a PCN reaction causing immediate rash, facial/tongue/throat swelling, SOB or lightheadedness with hypotension: No Has patient had a PCN reaction causing severe rash involving mucus membranes or skin necrosis: No Has patient had a PCN reaction that required hospitalization No Has patient had a PCN reaction occurring within  the last 10 years: No If all of the above answers are "NO", then may proceed with Cephalosporin use.     Procedures/Studies:   TEE Mild LV dilatation EF 35-40% inferior basal hypokinesis Severe central MR but no ruptured chords, no flail leaflet and no ruptured papillary muscle Normal RV No LAA thrombus No effusion Mild TR Normal AV  PICC 11/9  TTE 11/14 Study Conclusions  - Left ventricle: The cavity size was normal. Wall thickness was increased in a pattern of mild LVH. Systolic function was normal. The estimated ejection fraction was in the range of 55% to 65%. Moderate hypokinesis of the inferior myocardium. Features are consistent with a pseudonormal left ventricular filling pattern, with concomitant abnormal relaxation and increased filling pressure (grade 2 diastolic dysfunction). - Mitral valve: There was moderate regurgitation.  Echo 11/7 Study Conclusions  - Left ventricle: The cavity size was mildly  dilated. Systolic function was mildly to moderately reduced. The estimated ejection fraction was in the range of 40% to 45%. Diffuse mild hypokinesis. Moderate hypokinesis of the anteroseptal, lateral, inferolateral, inferior, and inferoseptal myocardium. Doppler parameters are consistent with a reversible restrictive pattern, indicative of decreased left ventricular diastolic compliance and/or increased left atrial pressure (grade 3 diastolic dysfunction). - Aortic valve: There was trivial regurgitation. - Mitral valve: There was moderate to severe regurgitation. - Left atrium: The atrium was moderately dilated. - Tricuspid valve: There was moderate regurgitation. - Pulmonary arteries: Systolic pressure was moderately increased. PA peak pressure: 38 mm Hg (S). - Pericardium, extracardiac: A trivial pericardial effusion was identified.  Impressions:  - Reduced LV EF. Appears mildly hypokinetic globally, with moderate hypokinesis of septal/inferior/lateral walls. Grade 3 diastolic dysfunction. Moderate to severe MR, moderate TR.   Dg Chest Port 1 View  Result Date: 10/13/2018 CLINICAL DATA:  Respiratory failure. EXAM: PORTABLE CHEST 1 VIEW COMPARISON:  10/12/2018 FINDINGS: Persistent airspace densities in the mid and lower lungs bilaterally. The lung densities have not significantly changed. Heart size remains upper limits of normal and stable. Left arm PICC line tip in the SVC and stable. 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 Negative for a pneumothorax. Surgical hardware in the lower cervical spine. IMPRESSION: Stable chest radiographic findings with no change in the bilateral airspace disease. Electronically Signed   By: Markus Daft M.D.   On: 10/13/2018 08:36   Dg Chest Port 1 View  Result Date: 10/12/2018 CLINICAL DATA:  Respiratory failure. EXAM: PORTABLE CHEST 1 VIEW COMPARISON:  10/11/2018 FINDINGS: The patient is rotated to the right. Endotracheal  and enteric tubes have been removed. A left PICC is unchanged. VP shunt catheter tubing courses through the right neck and chest. The cardiac silhouette appears mildly enlarged. Bibasilar airspace opacities as well as a small right pleural effusion have not significantly changed. No pneumothorax is identified. IMPRESSION: 1. Interval extubation. 2. Unchanged bibasilar airspace disease and small right pleural effusion. Electronically Signed   By: Logan Bores M.D.   On: 10/12/2018 08:38   Dg Chest Port 1 View  Result Date: 10/11/2018 CLINICAL DATA:  Check endotracheal tube placement EXAM: PORTABLE CHEST 1 VIEW COMPARISON:  10/10/2018 FINDINGS: Cardiac shadow is stable. Endotracheal tube, nasogastric catheter and left-sided PICC line are again seen. Shunt catheter is noted on the right better visualized on the current exam. Improved aeration is noted in the bases with decrease in pleural effusions particularly on the left. Small residual right effusion is seen. Some minimal residual atelectasis is noted. IMPRESSION: Improved aeration bilaterally. Mild right basilar  atelectasis remains as well as a small right pleural effusion. Electronically Signed   By: Inez Catalina M.D.   On: 10/11/2018 08:33   Dg Chest Port 1 View  Result Date: 10/10/2018 CLINICAL DATA:  Acute respiratory EXAM: PORTABLE CHEST 1 VIEW COMPARISON:  10/09/2018 FINDINGS: Cardiac shadow is stable. Endotracheal tube, nasogastric catheter and PICC line are again seen and stable. The nasogastric catheter proximal port again lies in the distal esophagus and could be advanced several cm. The lungs are well aerated bilaterally. Slight increased density is noted in the bases bilaterally again related to small effusions. These is stable from the previous exam. Bibasilar atelectatic changes are again seen as well. IMPRESSION: Stable bibasilar changes with atelectasis and effusions. Tubes and lines as described. The nasogastric catheter could be advanced  several cm as necessary. Electronically Signed   By: Inez Catalina M.D.   On: 10/10/2018 07:37   Dg Chest Port 1 View  Result Date: 10/09/2018 CLINICAL DATA:  Acute respiratory failure. EXAM: PORTABLE CHEST 1 VIEW COMPARISON:  10/08/2018 and older studies. FINDINGS: Hazy bilateral airspace lung opacities are without significant change from the previous day's study. More confluent opacity at the lung bases is consistent with bilateral pleural effusions. This is also stable. No new lung abnormalities.  No pneumothorax. New left PICC, catheter tip in the lower superior vena cava. Stable endotracheal tube and nasal/orogastric tube. IMPRESSION: 1. No change in lung aeration from the previous day's study with persistent bilateral hazy airspace lung opacities and bilateral pleural effusions. 2. New left PICC is well positioned. Endotracheal tube and nasal/orogastric tube are stable. Electronically Signed   By: Lajean Manes M.D.   On: 10/09/2018 08:11   Dg Chest Port 1 View  Result Date: 10/08/2018 CLINICAL DATA:  Respiratory failure. EXAM: PORTABLE CHEST 1 VIEW COMPARISON:  10/07/2018 FINDINGS: Endotracheal tube is 5.8 cm above the carina. Nasogastric tube extends into the abdomen but the tip is beyond the image. Bilateral airspace disease has not significantly changed. Cardiac silhouette is within normal limits and stable. The retrocardiac space is obscured by airspace disease or consolidation. Negative for a pneumothorax. Surgical plate in the lower cervical spine. Evidence for right pleural fluid. IMPRESSION: 1. Stable bilateral airspace disease. Basilar chest densities are suggestive for pleural fluid and consolidation. 2. Support apparatuses as described. Electronically Signed   By: Markus Daft M.D.   On: 10/08/2018 08:57   Dg Chest Port 1 View  Result Date: 10/07/2018 CLINICAL DATA:  ETT assessment.  Respiratory failure. EXAM: PORTABLE CHEST 1 VIEW COMPARISON:  10/06/2018 FINDINGS: Endotracheal tube  unchanged. Enteric tube has tip in the region of the stomach and side-port in the region of the expected gastroesophageal junction unchanged. Right-sided VP shunt unchanged. Lungs are adequately inflated demonstrate slight worsening hazy bilateral airspace opacification over the central and lower lungs which may be due to worsening interstitial edema or infection. Worse hazy bibasilar opacification likely bilateral effusions. Cardiomediastinal silhouette and remainder the exam is unchanged. IMPRESSION: Worsening hazy bibasilar opacification likely bilateral pleural effusions. Slight worsening hazy airspace process over the central and lower lungs likely interstitial edema, although infection is possible. Tubes and lines unchanged. Electronically Signed   By: Marin Olp M.D.   On: 10/07/2018 09:34   Dg Chest Port 1 View  Result Date: 10/06/2018 CLINICAL DATA:  Respiratory failure EXAM: PORTABLE CHEST 1 VIEW COMPARISON:  Yesterday FINDINGS: Endotracheal tube tip just below the clavicular heads. An orogastric tube reaches the stomach with side port at the  GE junction. Bilateral airspace and interstitial opacity. There has been significant improvement since yesterday, although findings are still extensive. Large lung volumes. Normal heart size. No pneumothorax. IMPRESSION: 1. Stable hardware positioning. 2. Significant improvement in airspace disease compatible with improving edema or clearing aspiration. 3. Hyperinflation. Electronically Signed   By: Monte Fantasia M.D.   On: 10/06/2018 09:38   Dg Chest Port 1 View  Result Date: 10/05/2018 CLINICAL DATA:  ET tube advanced, check position. EXAM: PORTABLE CHEST 1 VIEW COMPARISON:  Portable film earlier today. FINDINGS: ET tube now 5.9 cm above carina. Worsening aeration, with increasingly dense BILATERAL lung opacities. IMPRESSION: ET tube now 5.9 cm above carina.  Worsening aeration. Electronically Signed   By: Staci Righter M.D.   On: 10/05/2018 19:27    Dg Chest Port 1 View  Result Date: 10/05/2018 CLINICAL DATA:  Intubation. EXAM: PORTABLE CHEST 1 VIEW COMPARISON:  Portable film earlier today. FINDINGS: ET tube is been placed and lies 6.5 cm above the carina. Orogastric tube has also been placed, and terminates below the limits of the exposure. The heart is enlarged. BILATERAL pulmonary opacities are worse, consistent with pulmonary edema or diffuse pneumonia. IMPRESSION: ETT placement as described, 6.5 cm above carina. Worsening aeration, favored to represent pulmonary edema. Electronically Signed   By: Staci Righter M.D.   On: 10/05/2018 17:41   Dg Chest Portable 1 View  Result Date: 10/05/2018 CLINICAL DATA:  Hypoxia EXAM: PORTABLE CHEST 1 VIEW COMPARISON:  09/06/2018, 03/23/2018 FINDINGS: Moderate interstitial and ground-glass opacity bilaterally with small pleural effusions. Cardiomegaly. No pneumothorax. IMPRESSION: Cardiomegaly. Moderate bilateral interstitial and ground-glass opacity which may reflect pulmonary edema or diffuse pneumonia. There are small bilateral pleural effusions. Electronically Signed   By: Donavan Foil M.D.   On: 10/05/2018 16:44   Vas [place] Lower Extremity Venous (dvt)  Result Date: 10/06/2018  Lower Venous Study Indications: SOB.  Performing Technologist: Oliver Hum RVT  Examination Guidelines: A complete evaluation includes B-mode imaging, spectral Doppler, color Doppler, and power Doppler as needed of all accessible portions of each vessel. Bilateral testing is considered an integral part of a complete examination. Limited examinations for reoccurring indications may be performed as noted.  Right Venous Findings: +---------+---------------+---------+-----------+----------+-------+          CompressibilityPhasicitySpontaneityPropertiesSummary +---------+---------------+---------+-----------+----------+-------+ CFV      Full           Yes      Yes                           +---------+---------------+---------+-----------+----------+-------+ SFJ      Full                                                 +---------+---------------+---------+-----------+----------+-------+ FV Prox  Full                                                 +---------+---------------+---------+-----------+----------+-------+ FV Mid   Full                                                 +---------+---------------+---------+-----------+----------+-------+  FV DistalFull                                                 +---------+---------------+---------+-----------+----------+-------+ PFV      Full                                                 +---------+---------------+---------+-----------+----------+-------+ POP      Full           Yes      Yes                          +---------+---------------+---------+-----------+----------+-------+ PTV      Full                                                 +---------+---------------+---------+-----------+----------+-------+ PERO     Full                                                 +---------+---------------+---------+-----------+----------+-------+  Left Venous Findings: +---------+---------------+---------+-----------+----------+-------+          CompressibilityPhasicitySpontaneityPropertiesSummary +---------+---------------+---------+-----------+----------+-------+ CFV      Full           Yes      Yes                          +---------+---------------+---------+-----------+----------+-------+ SFJ      Full                                                 +---------+---------------+---------+-----------+----------+-------+ FV Prox  Full                                                 +---------+---------------+---------+-----------+----------+-------+ FV Mid   Full                                                  +---------+---------------+---------+-----------+----------+-------+ FV DistalFull                                                 +---------+---------------+---------+-----------+----------+-------+ PFV      Full                                                 +---------+---------------+---------+-----------+----------+-------+  POP      Full           Yes      Yes                          +---------+---------------+---------+-----------+----------+-------+ PTV      Full                                                 +---------+---------------+---------+-----------+----------+-------+ PERO     Full                                                 +---------+---------------+---------+-----------+----------+-------+    Summary: Right: There is no evidence of deep vein thrombosis in the lower extremity. No cystic structure found in the popliteal fossa. Left: There is no evidence of deep vein thrombosis in the lower extremity. No cystic structure found in the popliteal fossa.  *See table(s) above for measurements and observations. Electronically signed by Servando Snare MD on 10/06/2018 at 2:02:02 PM.    Final    [place] Ekg Site Rite  Result Date: 10/08/2018 If Site Rite image not attached, placement could not be confirmed due to current cardiac rhythm.    The results of significant diagnostics from this hospitalization (including imaging, microbiology, ancillary and laboratory) are listed below for reference.     Microbiology: No results found for this or any previous visit (from the past 240 hour(s)).   Labs: BNP (last 3 results) Recent Labs    10/05/18 1436  BNP 782.9*   Basic Metabolic Panel: Recent Labs  Lab 10/10/18 0418 10/11/18 0417  10/12/18 0326 10/13/18 0449 10/14/18 0459 10/15/18 0545 10/16/18 0638  NA 145 146*   < > 151* 144 141 140 141  K 3.1* 3.6   < > 3.5 3.9 4.2 4.9 4.3  CL 107 104   < > 108 104 104 108 108  CO2 31 34*   < > 35* 32 27 24 24    GLUCOSE 183* 239*   < > 213* 181* 173* 251* 186*  BUN 24* 30*   < > 36* 24* 18 13 12   CREATININE 1.06 1.22   < > 1.40* 1.06 1.17 1.12 1.04  CALCIUM 8.9 9.6   < > 9.9 9.5 9.1 9.0 9.7  MG 2.0 2.3  --  2.5* 2.2  --   --   --   PHOS 2.9 3.1  --  4.1 3.7  --   --   --    < > = values in this interval not displayed.   Liver Function Tests: No results for input(s): AST, ALT, ALKPHOS, BILITOT, PROT, ALBUMIN in the last 168 hours. No results for input(s): LIPASE, AMYLASE in the last 168 hours. No results for input(s): AMMONIA in the last 168 hours. CBC: Recent Labs  Lab 10/10/18 0418 10/11/18 0417 10/12/18 0326 10/13/18 0449  WBC 8.6 10.3 12.8* 10.9*  NEUTROABS  --  6.8 8.4* 6.2  HGB 8.3* 9.4* 10.0* 10.4*  HCT 26.7* 30.3* 32.9* 34.8*  MCV 98.2 98.4 99.1 97.2  PLT 151 179 196 206   Cardiac Enzymes: No results for input(s): CKTOTAL, CKMB, CKMBINDEX, TROPONINI in the last 168 hours.  BNP: Invalid input(s): POCBNP CBG: Recent Labs  Lab 10/15/18 0742 10/15/18 1219 10/15/18 1625 10/15/18 2110 10/16/18 0739  GLUCAP 199* 160* 211* 127* 161*   D-Dimer No results for input(s): DDIMER in the last 72 hours. Hgb A1c No results for input(s): HGBA1C in the last 72 hours. Lipid Profile No results for input(s): CHOL, HDL, LDLCALC, TRIG, CHOLHDL, LDLDIRECT in the last 72 hours. Thyroid function studies No results for input(s): TSH, T4TOTAL, T3FREE, THYROIDAB in the last 72 hours.  Invalid input(s): FREET3 Anemia work up No results for input(s): VITAMINB12, FOLATE, FERRITIN, TIBC, IRON, RETICCTPCT in the last 72 hours. Urinalysis    Component Value Date/Time   COLORURINE AMBER (A) 08/30/2018 1025   APPEARANCEUR CLOUDY (A) 08/30/2018 1025   LABSPEC 1.013 08/30/2018 1025   PHURINE 6.0 08/30/2018 1025   GLUCOSEU >=500 (A) 08/30/2018 1025   HGBUR LARGE (A) 08/30/2018 1025   BILIRUBINUR NEGATIVE 08/30/2018 1025   KETONESUR NEGATIVE 08/30/2018 1025   PROTEINUR 100 (A) 08/30/2018 1025    UROBILINOGEN 0.2 08/27/2017 1538   NITRITE NEGATIVE 08/30/2018 1025   LEUKOCYTESUR NEGATIVE 08/30/2018 1025   Sepsis Labs Invalid input(s): PROCALCITONIN,  WBC,  LACTICIDVEN Microbiology No results found for this or any previous visit (from the past 240 hour(s)).   Time coordinating discharge in minutes: 65  SIGNED:   Debbe Odea, MD  [facility] 10/16/2018, 10:54 AM Pager   If 7PM-7AM, please contact night-coverage [email or web address] Password [email or web address]

## 2018-10-16 NOTE — Discharge Instructions (Signed)
You were cared for by a hospitalist during your hospital stay. If you have any questions about your discharge medications or the care you received while you were in the hospital after you are discharged, you can call the unit and asked to speak with the hospitalist on call if the hospitalist that took care of you is not available. Once you are discharged, your primary care physician will handle any further medical issues.   Please note that NO REFILLS for any discharge medications will be authorized once you are discharged, as it is imperative that you return to your primary care physician (or establish a relationship with a primary care physician if you do not have one) for your aftercare needs so that they can reassess your need for medications and monitor your lab values.    Heart Failure Heart failure means your heart has trouble pumping blood. This makes it hard for your body to work well. Heart failure is usually a long-term (chronic) condition. You must take good care of yourself and follow your doctor's treatment plan. Follow these instructions at home:  Take your heart medicine as told by your doctor. ? Do not stop taking medicine unless your doctor tells you to. ? Do not skip any dose of medicine. ? Refill your medicines before they run out. ? Take other medicines only as told by your doctor or pharmacist.  Stay active if told by your doctor. The elderly and people with severe heart failure should talk with a doctor about physical activity.  Eat heart-healthy foods. Choose foods that are without trans fat and are low in saturated fat, cholesterol, and salt (sodium). This includes fresh or frozen fruits and vegetables, fish, lean meats, fat-free or low-fat dairy foods, whole grains, and high-fiber foods. Lentils and dried peas and beans (legumes) are also good choices.  Limit salt if told by your doctor.  Cook in a healthy way. Roast, grill, broil, bake, poach, steam, or stir-fry  foods.  Limit fluids as told by your doctor.  Weigh yourself every morning. Do this after you pee (urinate) and before you eat breakfast. Write down your weight to give to your doctor.  Take your blood pressure and write it down if your doctor tells you to.  Ask your doctor how to check your pulse. Check your pulse as told.  Lose weight if told by your doctor.  Stop smoking or chewing tobacco. Do not use gum or patches that help you quit without your doctor's approval.  Schedule and go to doctor visits as told.  Nonpregnant women should have no more than 1 drink a day. Men should have no more than 2 drinks a day. Talk to your doctor about drinking alcohol.  Stop illegal drug use.  Stay current with shots (immunizations).  Manage your health conditions as told by your doctor.  Learn to manage your stress.  Rest when you are tired.  If it is really hot outside: ? Avoid intense activities. ? Use air conditioning or fans, or get in a cooler place. ? Avoid caffeine and alcohol. ? Wear loose-fitting, lightweight, and light-colored clothing.  If it is really cold outside: ? Avoid intense activities. ? Layer your clothing. ? Wear mittens or gloves, a hat, and a scarf when going outside. ? Avoid alcohol.  Learn about heart failure and get support as needed.  Get help to maintain or improve your quality of life and your ability to care for yourself as needed. Contact a doctor if:  You gain weight quickly.  You are more short of breath than usual.  You cannot do your normal activities.  You tire easily.  You cough more than normal, especially with activity.  You have any or more puffiness (swelling) in areas such as your hands, feet, ankles, or belly (abdomen).  You cannot sleep because it is hard to breathe.  You feel like your heart is beating fast (palpitations).  You get dizzy or light-headed when you stand up. Get help right away if:  You have trouble  breathing.  There is a change in mental status, such as becoming less alert or not being able to focus.  You have chest pain or discomfort.  You faint. This information is not intended to replace advice given to you by your health care provider. Make sure you discuss any questions you have with your health care provider. Document Released: 08/25/2008 Document Revised: 04/23/2016 Document Reviewed: 01/02/2013 Elsevier Interactive Patient Education  2017 [place].  Please take all your medications with you for your next visit with your Primary MD. Please ask your Primary MD to get all Hospital records sent to his/her office. Please request your Primary MD to go over all hospital test results at the follow up.   If you experience worsening of your admission symptoms, develop shortness of breath, chest pain, suicidal or homicidal thoughts or a life threatening emergency, you must seek medical attention immediately by calling 911 or calling your MD.   Dennis Bast must read the complete instructions/literature along with all the possible adverse reactions/side effects for all the medicines you take including new medications that have been prescribed to you. Take new medicines after you have completely understood and accpet all the possible adverse reactions/side effects.    Do not drive when taking pain medications or sedatives.     Do not take more than prescribed Pain, Sleep and Anxiety Medications   If you have smoked or chewed Tobacco in the last 2 yrs please stop. Stop any regular alcohol  and or recreational drug use.   Wear Seat belts while driving.

## 2018-10-16 NOTE — Progress Notes (Signed)
Patient ID: [name], male   DOB: Nov 18, 1956, 62 y.o.   MRN: [id number]    PCP-Cardiologist: Bensimohn   Subjective:   Wants to go home. May stay with girl friend for a while Did walk about 61 feet yesterday with walker   Objective:   Weight Range: 78.8 kg Body mass index is 24.23 kg/m.   Vital Signs:   Temp:  [99 F (37.2 C)-99.2 F (37.3 C)] 99 F (37.2 C) (11/16 2107) Pulse Rate:  [85-97] 85 (11/16 2107) Resp:  [20-25] 25 (11/16 2107) BP: (107-118)/(66-75) 116/75 (11/17 0644) SpO2:  [99 %-100 %] 99 % (11/16 2107) Last BM Date: 10/15/18  Weight change: Filed Weights   10/13/18 0500 10/14/18 0330 10/15/18 0504  Weight: 80.2 kg 79.2 kg 78.8 kg    Intake/Output:   Intake/Output Summary (Last 24 hours) at 10/16/2018 0910 Last data filed at 10/16/2018 0645 Gross per 24 hour  Intake 989.32 ml  Output 3250 ml  Net -2260.68 ml      Physical Exam   More alert and interactive than when in CCU Chronically ill black male  HEENT: normal Neck supple with no adenopathy JVP normal no bruits no thyromegaly Lungs clear with no wheezing and good diaphragmatic motion Heart:  S1/S2 cannot appreciate MR murmur, no rub, gallop or click PMI normal Abdomen: benighn, BS positve, no tenderness, no AAA no bruit.  No HSM or HJR Distal pulses intact with no bruits No edema Neuro non-focal Skin warm and dry excoriation over right knee  No muscular weakness   Telemetry   Sinus tach 100-110s, personally reviewed.   Labs    CBC No results for input(s): WBC, NEUTROABS, HGB, HCT, MCV, PLT in the last 72 hours. Basic Metabolic Panel Recent Labs    10/15/18 0545 10/16/18 0638  NA 140 141  K 4.9 4.3  CL 108 108  CO2 24 24  GLUCOSE 251* 186*  BUN 13 12  CREATININE 1.12 1.04  CALCIUM 9.0 9.7   Liver Function Tests No results for input(s): AST, ALT, ALKPHOS, BILITOT, PROT, ALBUMIN in the last 72 hours. No results for input(s): LIPASE, AMYLASE in the last 72  hours. Cardiac Enzymes No results for input(s): CKTOTAL, CKMB, CKMBINDEX, TROPONINI in the last 72 hours.  BNP: BNP (last 3 results) Recent Labs    10/05/18 1436  BNP 779.2*     Other results:   Imaging    No results found.   Medications:     Scheduled Medications: . aspirin  81 mg Oral Daily  . atorvastatin  40 mg Oral q1800  . benztropine  1 mg Oral BID  . Chlorhexidine Gluconate Cloth  6 each Topical Daily  . clopidogrel  75 mg Oral Daily  . docusate sodium  100 mg Oral BID  . DULoxetine  60 mg Oral BID  . feeding supplement (ENSURE ENLIVE)  237 mL Oral Q1500  . folic acid  1 mg Oral Daily  . gabapentin  300 mg Oral QHS  . heparin  5,000 Units Subcutaneous Q8H  . insulin aspart  0-20 Units Subcutaneous TID AC & HS  . insulin glargine  5 Units Subcutaneous BID  . ipratropium-albuterol  3 mL Nebulization Once  . losartan  12.5 mg Oral QPM  . potassium chloride  40 mEq Oral BID  . spironolactone  25 mg Oral Daily  . thiamine  100 mg Oral Daily  . traZODone  100 mg Oral QHS    Infusions: . sodium chloride Stopped (  10/11/18 0817)    PRN Medications: sodium chloride, acetaminophen (TYLENOL) oral liquid 160 mg/5 mL, bisacodyl, sodium chloride flush    Patient Profile   62 y/o male with spinal stenosis, schizophrenia, bipolar disorder, polysubstance abuse, PAD, h/o DVT, HCV s/p treatment with Harvoni, DM with diabetic neuropathy,recent rhabdo admitted on 11/6 with NSTEMI (Peak trop 26.2) and respiratory failure requiring intubation.  Assessment/Plan   1. CAD: NSTEMI.  Severe diffuse disease in LCx system not amenable to PCI or CABG.  Medical management.  Turned down by surgery  Plavix added 11/16 given MI  2. Acute systolic CHF:  On admission had severe ischemic MR with EF 35-40% F//U TTE with improved EF and  Moderate MR continue aldactone Still with good diuresis without lasix   3. Acute hypoxemic respiratory failure:  Aspiration antibiotics done 11/11  off oxygen extubated 10/12/18   4. Polysubstance abuse: Long-standing. No change.     5. Schizophrenia.   Takes IM invega monthly at home (last dose 10/21). Has PRN risperdol. Per primary.   Patient wants to go home He may benefit from short duration of rehab.  Will sign off and arrange outpatient f/u in our office    Length of Stay: 11  Jenkins Rouge, MD  10/16/2018, 9:10 AM

## 2018-10-16 NOTE — Progress Notes (Signed)
Patient states he is going to girlfriend's house at d/c. Patient states girlfriend is not available today.  Dr. Wynelle Cleveland notified of safety concern as patient requires assist to get OOB, unsteady on feet and girlfriend not available today.   Dr. Wynelle Cleveland states patient is d/ced.

## 2018-10-16 NOTE — Care Management Note (Signed)
Case Management Note  Patient Details  Name: [name] MRN: [id number] Date of Birth: 08-Sep-1956  Subjective/Objective:                    Action/Plan:  Notified [facility] that patient will DC today with resumption of [place] orders. RW will be delivered to room prior to DC. No other CM needs identified.  Expected Discharge Date:  10/16/18               Expected Discharge Plan:  [place]  In-House Referral:     Discharge planning Services  CM Consult  Post Acute Care Choice:  Home Health, Resumption of Svcs/PTA Provider, Durable Medical Equipment Choice offered to:  Patient  DME Arranged:  [facility] DME Agency:  [place]:  RN, PT, Nurse's Aide, Social Work [employer] Agency:  [facility] [employer]  Status of Service:  Completed, signed off  If discussed at [employer], dates discussed:    Additional Comments:  Carles Collet, RN 10/16/2018, 12:30 PM

## 2018-10-17 ENCOUNTER — Telehealth (HOSPITAL_COMMUNITY): Payer: Self-pay | Admitting: Surgery

## 2018-10-17 NOTE — Telephone Encounter (Signed)
I attempted to reach patient at both numbers to inform him of scheduled HF Clinic follow-up appt.  I was unable to leave a message-therefore will attempt to call patient again at a later time.

## 2018-10-18 NOTE — Telephone Encounter (Signed)
Called and spoke with patient.  He is aware of HFU appt on 10/25/18 @2 :30.  Also gave patient directions to our facility and for parking, pt voiced understanding.

## 2018-10-18 NOTE — Telephone Encounter (Signed)
Called patient, unable to leave message on either number in patient's chart.  Left message on phone for pt's EC, Cipriano Bunker, requesting she or patient call our office for appt info

## 2018-10-24 NOTE — Progress Notes (Signed)
Advanced Heart Failure Clinic Note   Referring Physician: PCP: Nolene Ebbs, MD PCP-Cardiologist: Dr Haroldine Laws  HPI: 62 y/o male with spinal stenosis, shizhophrenia, bipolar disorder, polysubstance abuse, PAD, h/o DVT, HCV s/p treatment with Harvoni, DM with diabetic neuropathy, and recent rhabdo (08/2018, secondary to cocaine use).   He was admitted on 10/05/18 with NSTEMI (Peak trop 26.2) and respiratory failure requiring intubation.  TEE initially showed EF 35-40% with severe central MR. LHC showed diffuse disease in LCx, not amenable to PCI or CABG. MR thought to be infarct-related in LCx territory. Not a surgical candidate. Diuresed with IV lasix. HF meds optimized. Did not require inotrope support. Repeat echo showed EF 55-60% with mod MR. HF team consulted. HF medications titrated. Treated for PNA. Course complicated by hypernatremia. Improved with D5W infusion. Lasix held at DC. DC weight: 173 lbs.   He returns today for post hospital follow up. Overall doing okay. Since discharge, he has been having left sided CP that lasts about 2 minutes. It can occur at rest or with activity. No associated symptoms. It happens several times/day. Denies SOB. Able to get around the house, but has not done much else as far as activity. Denies edema, orthopnea, or PND.  No dizziness. He has palpitations occasionally before bedtime or in the morning. Not weighing at home. He misses PM meds frequently. No ETOH or drug use. 1 pack of cigarettes lasts him 3-4 days. Starting nicotine patch from PCP.   Echo 10/13/18: EF 55-65%, grade 2 DD, mod MR TEE 10/07/18: EF 35-40% severe MR no flail TTE 10/06/18: EF 40-45% inferolateral AK mod-sev MR  (echo 09/07/18 with EF 60-65%)  Cath 10/07/2018 LM: ok LAD: 50%p LCx: 90% ostial diffusely diseased throughout RCA: 40% LVEDP = 32 RA = 12 RV = 52/13 PA =37/26 PCWP = 26 Fick 7.0/3.34  Review of systems complete and found to be negative unless listed in HPI.    Past Medical History:  Diagnosis Date  . Anxiety   . Bipolar disorder ([place])   . Chronic back pain   . Depression   . Diabetes mellitus without complication ([place])   . Diabetic neuropathy ([place])   . DVT (deep venous thrombosis) ([place])   . Enlarged prostate   . Headache   . Hepatitis    Hepatitis C - has been treated with Harvoni (cleared in July, 2017)  . History of colon polyps   . Peripheral vascular disease ([place])   . Pneumonia    hx of-about 53yrs ago  . Rhabdomyolysis 08/30/2018  . Schizophrenia ([place])   . Spinal stenosis     Current Outpatient Medications  Medication Sig Dispense Refill  . atorvastatin (LIPITOR) 40 MG tablet Take 1 tablet (40 mg total) by mouth daily at 6 PM. 30 tablet 0  . benztropine (COGENTIN) 1 MG tablet Take 1 mg by mouth 2 (two) times daily.    . bisacodyl (DULCOLAX) 10 MG suppository Place 1 suppository (10 mg total) rectally daily as needed for moderate constipation. 12 suppository 0  . cholecalciferol (VITAMIN D) 1000 units tablet Take 1,000 Units by mouth daily.    . clopidogrel (PLAVIX) 75 MG tablet Take 1 tablet (75 mg total) by mouth daily. 30 tablet 0  . docusate sodium (COLACE) 100 MG capsule Take 1 capsule (100 mg total) by mouth 2 (two) times daily. 10 capsule 0  . DULoxetine (CYMBALTA) 60 MG capsule Take 60 mg by mouth 2 (two) times daily.    . finasteride (PROSCAR) 5 MG  tablet Take 5 mg by mouth daily.     [name] gabapentin (NEURONTIN) 300 MG capsule Take 1 capsule (300 mg total) by mouth at bedtime. (Patient taking differently: Take 300 mg by mouth 3 (three) times daily. ) 30 capsule 3  . INVEGA SUSTENNA 234 MG/1.5ML SUSP injection Inject 234 mg into the muscle every 30 (thirty) days.     [name] losartan (COZAAR) 25 MG tablet Take 0.5 tablets (12.5 mg total) by mouth every evening. 30 tablet 0  . metFORMIN (GLUCOPHAGE) 1000 MG tablet Take 1,000 mg by mouth 2 (two) times daily with a meal.     . polyethylene glycol (MIRALAX / GLYCOLAX) packet Take 17 g  by mouth daily. 14 each 0  . sitaGLIPtin (JANUVIA) 100 MG tablet Take 100 mg by mouth daily with breakfast.    . spironolactone (ALDACTONE) 25 MG tablet Take 1 tablet (25 mg total) by mouth daily. 30 tablet 0  . tamsulosin (FLOMAX) 0.4 MG CAPS capsule Take 0.4 mg by mouth daily after supper.     . traZODone (DESYREL) 150 MG tablet Take 0.5 tablets (75 mg total) by mouth at bedtime. (Patient taking differently: Take 300 mg by mouth at bedtime. ) 15 tablet 0   No current facility-administered medications for this encounter.     Allergies  Allergen Reactions  . Penicillins Hives and Other (See Comments)    Has patient had a PCN reaction causing immediate rash, facial/tongue/throat swelling, SOB or lightheadedness with hypotension: No Has patient had a PCN reaction causing severe rash involving mucus membranes or skin necrosis: No Has patient had a PCN reaction that required hospitalization No Has patient had a PCN reaction occurring within the last 10 years: No If all of the above answers are "NO", then may proceed with Cephalosporin use.      Social History   Socioeconomic History  . Marital status: Single    Spouse name: Not on file  . Number of children: Not on file  . Years of education: Not on file  . Highest education level: Not on file  Occupational History  . Occupation: disabled  Social Needs  . Financial resource strain: Not on file  . Food insecurity:    Worry: Not on file    Inability: Not on file  . Transportation needs:    Medical: Not on file    Non-medical: Not on file  Tobacco Use  . Smoking status: Current Every Day Smoker    Packs/day: 1.00    Years: 40.00    Pack years: 40.00    Types: Cigarettes  . Smokeless tobacco: Never Used  Substance and Sexual Activity  . Alcohol use: Yes    Comment: occasional use  . Drug use: Yes    Types: Cocaine, Marijuana  . Sexual activity: Yes  Lifestyle  . Physical activity:    Days per week: Not on file    Minutes  per session: Not on file  . Stress: Not on file  Relationships  . Social connections:    Talks on phone: Not on file    Gets together: Not on file    Attends religious service: Not on file    Active member of club or organization: Not on file    Attends meetings of clubs or organizations: Not on file    Relationship status: Not on file  . Intimate partner violence:    Fear of current or ex partner: Not on file    Emotionally abused: Not on file  Physically abused: Not on file    Forced sexual activity: Not on file  Other Topics Concern  . Not on file  Social History Narrative  . Not on file      Family History  Problem Relation Age of Onset  . Heart attack Father   . Anesthesia problems Neg Hx   . Hypotension Neg Hx   . Malignant hyperthermia Neg Hx   . Pseudochol deficiency Neg Hx     Vitals:   10/25/18 1438  BP: 128/74  Pulse: 99  SpO2: 99%  Weight: 82.6 kg (182 lb 3.2 oz)   Wt Readings from Last 3 Encounters:  10/25/18 82.6 kg (182 lb 3.2 oz)  10/15/18 78.8 kg (173 lb 11.2 oz)  08/30/18 82.9 kg (182 lb 12.2 oz)    PHYSICAL EXAM: General:  No respiratory difficulty HEENT: normal Neck: supple. no JVD. Carotids 2+ bilat; no bruits. No lymphadenopathy or thyromegaly appreciated. Cor: PMI nondisplaced. Regular rate & rhythm. Soft HSM at apex Lungs: clear Abdomen: soft, nontender, nondistended. No hepatosplenomegaly. No bruits or masses. Good bowel sounds. Extremities: no cyanosis, clubbing, rash, edema Neuro: alert & oriented x 3, cranial nerves grossly intact. moves all 4 extremities w/o difficulty. Affect pleasant.   ASSESSMENT & PLAN:   1. Chronic systolic CHF: TEE with EF 35-40% and severe central mitral regurgitation. Echo 10/13/18 with LVEF 55-60%, Moderate MR.  - Severe MR likely plays a role in CHF/respiratory failure. - NYHA II-III. Difficult to assess with decreased activity - Volume status stable on exam, but up 9 lbs. ReDS clip: 35% - Start  lasix 40 mg twice/week.  - Continue spiro 25 mg daily - Continue losartan 12.5 mg daily. Change to am as he is noncompliant with pm medications.  - Refer to paramedicine  2. CAD: NSTEMI.  Severe diffuse disease in LCx system not amenable to PCI or CABG.  Medical management.  - Continue ASA 81, plavix, and atorvastatin 40 daily. Has not been tasking ASA. Restart today. EKG okay today.  - Add imdur 30 mg daily  3. Mitral regurgitation: Secondary, infarct-related (LCx territory). No papillary muscle rupture. Not surgical candidate.  Mitraclip may be consideration down the road but probable pure secondary MR makes difficult at this time. Expect to improve with diuresis.  - Echo 10/13/18 with LVEF 55-60%, Moderate MR. No change.  4. Polysubstance abuse: Long-standing.  - Denies recent substance abuse. Still smoking. 1 pack lasts 3-4 days. PCP prescribed nicotine patch today. No ETOH or drug use.   5. Schizophrenia.  - Per PCP and psychiatrist.   6. Anemia - Denies bleeding.   7. NSVT during admission - Has some palpitations. Check BMET and mag today. May need to check ziopatch if continues.  8. Recent hypernatremia - BMET today  BMET, mag Switch atorvastatin and losartan to am to increase compliance. If he gets dizzy, will need to change losartan back to qHS. Start lasix 40 mg 2x/week (Tues/Friday) Add imdur 30 mg daily Restart ASA 81 mg daily Provided a scale today Refer to paramedicine Follow up 2-3 weeks  Georgiana Shore, NP 10/25/18  Greater than 50% of the 35 minute visit was spent in counseling/coordination of care regarding disease state education, salt/fluid restriction, sliding scale diuretics, and medication compliance.

## 2018-10-25 ENCOUNTER — Encounter (HOSPITAL_COMMUNITY): Payer: Self-pay

## 2018-10-25 ENCOUNTER — Telehealth (HOSPITAL_COMMUNITY): Payer: Self-pay | Admitting: Surgery

## 2018-10-25 ENCOUNTER — Ambulatory Visit (HOSPITAL_COMMUNITY)
Admission: RE | Admit: 2018-10-25 | Discharge: 2018-10-25 | Disposition: A | Discharge status: Home / Self Care | Payer: Medicare Other | Source: Ambulatory Visit | Attending: Cardiology | Admitting: Cardiology

## 2018-10-25 VITALS — BP 128/74 | HR 99 | Wt 182.2 lb

## 2018-10-25 DIAGNOSIS — Z8249 Family history of ischemic heart disease and other diseases of the circulatory system: Secondary | ICD-10-CM | POA: Insufficient documentation

## 2018-10-25 DIAGNOSIS — R002 Palpitations: Secondary | ICD-10-CM

## 2018-10-25 DIAGNOSIS — I2510 Atherosclerotic heart disease of native coronary artery without angina pectoris: Secondary | ICD-10-CM | POA: Diagnosis not present

## 2018-10-25 DIAGNOSIS — Z9119 Patient's noncompliance with other medical treatment and regimen: Secondary | ICD-10-CM | POA: Diagnosis not present

## 2018-10-25 DIAGNOSIS — F419 Anxiety disorder, unspecified: Secondary | ICD-10-CM | POA: Insufficient documentation

## 2018-10-25 DIAGNOSIS — Z7984 Long term (current) use of oral hypoglycemic drugs: Secondary | ICD-10-CM | POA: Insufficient documentation

## 2018-10-25 DIAGNOSIS — I5032 Chronic diastolic (congestive) heart failure: Secondary | ICD-10-CM

## 2018-10-25 DIAGNOSIS — Z7902 Long term (current) use of antithrombotics/antiplatelets: Secondary | ICD-10-CM | POA: Diagnosis not present

## 2018-10-25 DIAGNOSIS — E1140 Type 2 diabetes mellitus with diabetic neuropathy, unspecified: Secondary | ICD-10-CM | POA: Insufficient documentation

## 2018-10-25 DIAGNOSIS — I214 Non-ST elevation (NSTEMI) myocardial infarction: Secondary | ICD-10-CM | POA: Insufficient documentation

## 2018-10-25 DIAGNOSIS — E1151 Type 2 diabetes mellitus with diabetic peripheral angiopathy without gangrene: Secondary | ICD-10-CM | POA: Insufficient documentation

## 2018-10-25 DIAGNOSIS — Z7982 Long term (current) use of aspirin: Secondary | ICD-10-CM | POA: Diagnosis not present

## 2018-10-25 DIAGNOSIS — I25119 Atherosclerotic heart disease of native coronary artery with unspecified angina pectoris: Secondary | ICD-10-CM

## 2018-10-25 DIAGNOSIS — F319 Bipolar disorder, unspecified: Secondary | ICD-10-CM | POA: Insufficient documentation

## 2018-10-25 DIAGNOSIS — F209 Schizophrenia, unspecified: Secondary | ICD-10-CM | POA: Diagnosis not present

## 2018-10-25 DIAGNOSIS — Z880 Allergy status to penicillin: Secondary | ICD-10-CM | POA: Diagnosis not present

## 2018-10-25 DIAGNOSIS — F17210 Nicotine dependence, cigarettes, uncomplicated: Secondary | ICD-10-CM | POA: Insufficient documentation

## 2018-10-25 DIAGNOSIS — I5022 Chronic systolic (congestive) heart failure: Secondary | ICD-10-CM | POA: Insufficient documentation

## 2018-10-25 DIAGNOSIS — Z86718 Personal history of other venous thrombosis and embolism: Secondary | ICD-10-CM | POA: Diagnosis not present

## 2018-10-25 DIAGNOSIS — D649 Anemia, unspecified: Secondary | ICD-10-CM | POA: Insufficient documentation

## 2018-10-25 DIAGNOSIS — Z720 Tobacco use: Secondary | ICD-10-CM

## 2018-10-25 DIAGNOSIS — I340 Nonrheumatic mitral (valve) insufficiency: Secondary | ICD-10-CM

## 2018-10-25 DIAGNOSIS — I472 Ventricular tachycardia: Secondary | ICD-10-CM | POA: Diagnosis not present

## 2018-10-25 DIAGNOSIS — F1910 Other psychoactive substance abuse, uncomplicated: Secondary | ICD-10-CM | POA: Insufficient documentation

## 2018-10-25 DIAGNOSIS — E870 Hyperosmolality and hypernatremia: Secondary | ICD-10-CM

## 2018-10-25 LAB — BASIC METABOLIC PANEL
ANION GAP: 8 (ref 5–15)
BUN: 6 mg/dL — ABNORMAL LOW (ref 8–23)
CO2: 27 mmol/L (ref 22–32)
Calcium: 9.5 mg/dL (ref 8.9–10.3)
Chloride: 105 mmol/L (ref 98–111)
Creatinine, Ser: 1.03 mg/dL (ref 0.61–1.24)
GFR calc non Af Amer: >60 mL/min (ref >60)
GLUCOSE: 142 mg/dL — AB (ref 70–99)
POTASSIUM: 3.8 mmol/L (ref 3.5–5.1)
Sodium: 140 mmol/L (ref 135–145)

## 2018-10-25 LAB — MAGNESIUM: Magnesium: 1.5 mg/dL — ABNORMAL LOW (ref 1.7–2.4)

## 2018-10-25 MED ORDER — LOSARTAN POTASSIUM 25 MG PO TABS: 12.5000 mg | ORAL_TABLET | ORAL | 0 refills | Status: DC

## 2018-10-25 MED ORDER — ATORVASTATIN CALCIUM 40 MG PO TABS: 40.0000 mg | ORAL_TABLET | ORAL | 0 refills | Status: DC

## 2018-10-25 MED ORDER — FUROSEMIDE 40 MG PO TABS: 40.0000 mg | ORAL_TABLET | Freq: Every day | ORAL | 3 refills | Status: AC

## 2018-10-25 MED ORDER — ASPIRIN EC 81 MG PO TBEC: 81.0000 mg | DELAYED_RELEASE_TABLET | Freq: Every day | ORAL | 3 refills | Status: AC

## 2018-10-25 MED ORDER — ISOSORBIDE MONONITRATE ER 30 MG PO TB24: 30.0000 mg | ORAL_TABLET | Freq: Every day | ORAL | 6 refills | Status: DC

## 2018-10-25 NOTE — Progress Notes (Signed)
ReDS Vest - 10/25/18 1519      ReDS Vest   MR   Moderate    Fitting Posture  Sitting    Height Marker  Tall    Ruler Value  27   level C   Center Strip  Aligned    ReDS Value  35

## 2018-10-25 NOTE — Patient Instructions (Addendum)
CHANGE how you take Losartan (COZAAR) and Atorvastatin (Lipitor). START taking these meds in the morning.  START Aspirin 81mg  (1 tab) daily  START Lasix 40mg  (1 tab) twice a week on Tuesdays and Fridays.  START Imdur 30mg  (1 tab) daily.  Labs today We will only contact you if something comes back abnormal or we need to make some changes. Otherwise no news is good news!  You have been referred to the PARAMEDICINE program to assist you with managing your medications at home. They will contact you to schedule home visits.   Your physician recommends that you schedule a follow-up appointment in: 2-3 weeks

## 2018-10-25 NOTE — Telephone Encounter (Signed)
Patient referral received for the [facility] program.  I have sent all appropriate paperwork via secure email to paramedic team.

## 2018-10-31 ENCOUNTER — Telehealth (HOSPITAL_COMMUNITY): Payer: Self-pay | Admitting: Cardiology

## 2018-10-31 ENCOUNTER — Ambulatory Visit (INDEPENDENT_AMBULATORY_CARE_PROVIDER_SITE_OTHER): Payer: Medicare Other | Admitting: Specialist

## 2018-10-31 MED ORDER — MAGNESIUM OXIDE -MG SUPPLEMENT 200 MG PO TABS: 400.0000 mg | ORAL_TABLET | Freq: Every day | ORAL | 11 refills | Status: AC

## 2018-10-31 NOTE — Telephone Encounter (Signed)
-----   Message from Georgiana Shore, NP sent at 10/26/2018  8:05 AM EST ----- Renal function stable. Mag low at 1.5, may be contributing to his palpitations. Please have him start max ox 400 mg daily. Warn him it can cause some diarrhea. We can recheck mag at follow up. Thanks

## 2018-10-31 NOTE — Telephone Encounter (Signed)
Notes recorded by Kerry Dory, CMA on 10/31/2018 at 4:29 PM EST Patient aware. Patient voiced understanding   [phone] (H) ------  Notes recorded by Vanice Sarah, CMA on 10/26/2018 at 11:48 AM EST I called both number on pt's chart and neither would let me leave VM. Will try again later. ------  Notes recorded by Georgiana Shore, NP on 10/26/2018 at 8:05 AM EST Renal function stable. Mag low at 1.5, may be contributing to his palpitations. Please have him start max ox 400 mg daily. Warn him it can cause some diarrhea. We can recheck mag at follow up. Thanks

## 2018-11-01 ENCOUNTER — Telehealth (HOSPITAL_COMMUNITY): Payer: Self-pay

## 2018-11-01 NOTE — Telephone Encounter (Signed)
Contacted pt regarding referral to [facility] program, I called both numbers with no answer and both numbers was not able to leave VM.   Marylouise Stacks, EMT-Paramedic  11/01/18

## 2018-11-07 ENCOUNTER — Other Ambulatory Visit (HOSPITAL_COMMUNITY): Payer: Self-pay

## 2018-11-07 NOTE — Progress Notes (Signed)
--  telephone encounter--  Plan on seeing pt at his clinic appointment on Thursday.   Marylouise Stacks, EMT -Paramedic  11/07/18

## 2018-11-10 ENCOUNTER — Inpatient Hospital Stay (HOSPITAL_COMMUNITY): Admission: RE | Admit: 2018-11-10 | Payer: Medicare Other | Source: Ambulatory Visit

## 2018-11-11 ENCOUNTER — Telehealth (HOSPITAL_COMMUNITY): Payer: Self-pay | Admitting: Licensed Clinical Social Worker

## 2018-11-11 NOTE — Telephone Encounter (Signed)
CSW called pt regarding no show appointment from yesterday.  Pt reports he would like to reschedule and sees no problems in making it to future appt.  CSW will continue to follow in clinic and assist as needed  Jorge Ny, [place] Worker [facility] [phone]

## 2018-11-15 ENCOUNTER — Other Ambulatory Visit (HOSPITAL_COMMUNITY): Payer: Self-pay | Admitting: Pharmacist

## 2018-11-15 ENCOUNTER — Other Ambulatory Visit (HOSPITAL_COMMUNITY): Payer: Self-pay

## 2018-11-15 MED ORDER — ATORVASTATIN CALCIUM 40 MG PO TABS: 40.0000 mg | ORAL_TABLET | ORAL | 2 refills | Status: AC

## 2018-11-15 MED ORDER — SPIRONOLACTONE 25 MG PO TABS: 25.0000 mg | ORAL_TABLET | Freq: Every day | ORAL | 2 refills | Status: AC

## 2018-11-15 MED ORDER — CLOPIDOGREL BISULFATE 75 MG PO TABS: 75.0000 mg | ORAL_TABLET | Freq: Every day | ORAL | 2 refills | Status: AC

## 2018-11-15 NOTE — Progress Notes (Signed)
Paramedicine Encounter    Patient ID: [name], male    DOB: Mar 02, 1956, 62 y.o.   MRN: [id number]   Patient Care Team: Nolene Ebbs, MD as PCP - General (Internal Medicine) Jorge Ny, LCSW as Social Worker (Licensed Clinical Social Worker)  Patient Active Problem List   Diagnosis Date Noted  . Acute on chronic systolic CHF (congestive heart failure) ([place]) 10/16/2018  . Severe mitral regurgitation   . Non-STEMI (non-ST elevated myocardial infarction) ([place]) 10/05/2018  . RUQ pain   . Transaminitis   . Rhabdomyolysis 08/30/2018  . Tobacco dependence 03/24/2018  . Cocaine abuse ([place]) 03/24/2018  . Anemia due to blood loss 06/30/2017    Class: Acute  . Diabetes mellitus without complication ([place]) 06/00/4599    Class: Chronic  . Spinal stenosis of lumbar region 06/29/2017    Class: Chronic  . Cauda equina compression ([place]) 06/29/2017    Class: Acute  . Spondylolisthesis, lumbar region 06/29/2017    Class: Chronic  . History of lumbar spinal fusion 06/26/2017  . Myelopathy ([place]) 06/24/2017  . Low back pain 06/24/2017  . NPH (normal pressure hydrocephalus) ([place]) 10/30/2016  . Bilateral sciatica 09/01/2016  . Hydrocephalus in adult [facility]) 07/01/2016  . Shaking spells 06/01/2016  . Gait disturbance 04/10/2015  . Numbness 04/10/2015  . Lumbar radicular pain 04/10/2015  . Lumbar spondylosis 04/10/2015  . Bipolar I disorder with depression, severe ([place]) 06/30/2014  . Suicidal ideations 06/30/2014  . Ulcer of lower limb, unspecified 02/20/2014    Current Outpatient Medications:  .  aspirin EC 81 MG tablet, Take 1 tablet (81 mg total) by mouth daily., Disp: 30 tablet, Rfl: 3 .  benztropine (COGENTIN) 1 MG tablet, Take 1 mg by mouth 2 (two) times daily., Disp: , Rfl:  .  cholecalciferol (VITAMIN D) 1000 units tablet, Take 1,000 Units by mouth daily., Disp: , Rfl:  .  DULoxetine (CYMBALTA) 60 MG capsule, Take 60 mg by mouth 2 (two) times daily., Disp: , Rfl:  .   finasteride (PROSCAR) 5 MG tablet, Take 5 mg by mouth daily. , Disp: , Rfl:  .  furosemide (LASIX) 40 MG tablet, Take 1 tablet (40 mg total) by mouth daily. Tuesday and Friday, Disp: 8 tablet, Rfl: 3 .  gabapentin (NEURONTIN) 300 MG capsule, Take 1 capsule (300 mg total) by mouth at bedtime. (Patient taking differently: Take 300 mg by mouth 3 (three) times daily. ), Disp: 30 capsule, Rfl: 3 .  INVEGA SUSTENNA 234 MG/1.5ML SUSP injection, Inject 234 mg into the muscle every 30 (thirty) days. , Disp: , Rfl:  .  isosorbide mononitrate (IMDUR) 30 MG 24 hr tablet, Take 1 tablet (30 mg total) by mouth daily., Disp: 30 tablet, Rfl: 6 .  losartan (COZAAR) 25 MG tablet, Take 0.5 tablets (12.5 mg total) by mouth every morning., Disp: 30 tablet, Rfl: 0 .  Magnesium Oxide 200 MG TABS, Take 2 tablets (400 mg total) by mouth daily., Disp: 60 tablet, Rfl: 11 .  metFORMIN (GLUCOPHAGE) 1000 MG tablet, Take 1,000 mg by mouth 2 (two) times daily with a meal. , Disp: , Rfl:  .  polyethylene glycol (MIRALAX / GLYCOLAX) packet, Take 17 g by mouth daily., Disp: 14 each, Rfl: 0 .  sitaGLIPtin (JANUVIA) 100 MG tablet, Take 100 mg by mouth daily with breakfast., Disp: , Rfl:  .  tamsulosin (FLOMAX) 0.4 MG CAPS capsule, Take 0.4 mg by mouth daily after supper. , Disp: , Rfl:  .  traZODone (DESYREL) 150 MG  tablet, Take 0.5 tablets (75 mg total) by mouth at bedtime. (Patient taking differently: Take 300 mg by mouth at bedtime. ), Disp: 15 tablet, Rfl: 0 .  atorvastatin (LIPITOR) 40 MG tablet, Take 1 tablet (40 mg total) by mouth every morning., Disp: 30 tablet, Rfl: 2 .  bisacodyl (DULCOLAX) 10 MG suppository, Place 1 suppository (10 mg total) rectally daily as needed for moderate constipation. (Patient not taking: Reported on 11/15/2018), Disp: 12 suppository, Rfl: 0 .  clopidogrel (PLAVIX) 75 MG tablet, Take 1 tablet (75 mg total) by mouth daily., Disp: 30 tablet, Rfl: 2 .  docusate sodium (COLACE) 100 MG capsule, Take 1  capsule (100 mg total) by mouth 2 (two) times daily. (Patient not taking: Reported on 11/15/2018), Disp: 10 capsule, Rfl: 0 .  spironolactone (ALDACTONE) 25 MG tablet, Take 1 tablet (25 mg total) by mouth daily., Disp: 30 tablet, Rfl: 2 Allergies  Allergen Reactions  . Penicillins Hives and Other (See Comments)    Has patient had a PCN reaction causing immediate rash, facial/tongue/throat swelling, SOB or lightheadedness with hypotension: No Has patient had a PCN reaction causing severe rash involving mucus membranes or skin necrosis: No Has patient had a PCN reaction that required hospitalization No Has patient had a PCN reaction occurring within the last 10 years: No If all of the above answers are "NO", then may proceed with Cephalosporin use.      Social History   Socioeconomic History  . Marital status: Single    Spouse name: Not on file  . Number of children: Not on file  . Years of education: Not on file  . Highest education level: Not on file  Occupational History  . Occupation: disabled  Social Needs  . Financial resource strain: Not on file  . Food insecurity:    Worry: Not on file    Inability: Not on file  . Transportation needs:    Medical: Not on file    Non-medical: Not on file  Tobacco Use  . Smoking status: Current Every Day Smoker    Packs/day: 1.00    Years: 40.00    Pack years: 40.00    Types: Cigarettes  . Smokeless tobacco: Never Used  Substance and Sexual Activity  . Alcohol use: Yes    Comment: occasional use  . Drug use: Yes    Types: Cocaine, Marijuana  . Sexual activity: Yes  Lifestyle  . Physical activity:    Days per week: Not on file    Minutes per session: Not on file  . Stress: Not on file  Relationships  . Social connections:    Talks on phone: Not on file    Gets together: Not on file    Attends religious service: Not on file    Active member of club or organization: Not on file    Attends meetings of clubs or organizations:  Not on file    Relationship status: Not on file  . Intimate partner violence:    Fear of current or ex partner: Not on file    Emotionally abused: Not on file    Physically abused: Not on file    Forced sexual activity: Not on file  Other Topics Concern  . Not on file  Social History Narrative  . Not on file    Physical Exam      Future Appointments  Date Time Provider [place]  12/05/2018 12:00 PM [facility] PA/NP [facility] None    BP 128/68   Pulse  98   Resp 16   SpO2 98%     First meeting with pt, he lives in apt, he met me in the lobby and asked I wait downstairs for him as he went upstairs to get his meds. He just moved to these apts a few months ago. He does live alone. He has sister here in [place].  He has friend that helps him out, he reports he is good to talk to, provides transportation as well.  He brought down a bubble pack of meds from [facility].  He has medicare and medicaid. He is interested in using the medicaid transportation so that info was given to him.  He is active with ACT team, his nurse picks up his meds and brings them to him. I called the pharmacy and she advised that he was in need of atorvast, Arlyce Harman and plavix refilled so she can finish the bubble pack and so it will get delivered.  Erika sent in refills for him.   Pt reports he has chest pains about twice a day. They typically go away on its own. He reports not taking the ntg during this as well.  He states he is no table to move when this happens and the pain increases with movement. He denies sob or dizziness or diaphoresis during these times.   He has not been weighing daily, he does not have scales so I gave him some and instructed him when and why to weigh and asked him to write it down. He missed his last f/u clinic appoint. It was resch.  He does want a home aide. Not sure if his ACT nurse has worked on that for him or not, but I asked him to give her my number to call me so we can  coordinate.   Marylouise Stacks, [facility] Paramedic  11/16/18

## 2018-12-01 NOTE — Progress Notes (Addendum)
Advanced Heart Failure Clinic Note   Referring Physician: PCP: Nolene Ebbs, MD PCP-Cardiologist: Dr Haroldine Laws  HPI: 63 y/o male with spinal stenosis, shizhophrenia, bipolar disorder, polysubstance abuse, PAD, h/o DVT, HCV s/p treatment with Harvoni, DM with diabetic neuropathy, and recent rhabdo (08/2018, secondary to cocaine use).   He was admitted on 10/05/18 with NSTEMI (Peak trop 26.2) and respiratory failure requiring intubation.  TEE initially showed EF 35-40% with severe central MR. LHC showed diffuse disease in LCx, not amenable to PCI or CABG. MR thought to be infarct-related in LCx territory. Not a surgical candidate. Diuresed with IV lasix. HF meds optimized. Did not require inotrope support. Repeat echo showed EF 55-60% with mod MR. HF team consulted. HF medications titrated. Treated for PNA. Course complicated by hypernatremia. Improved with D5W infusion. Lasix held at DC. DC weight: 173 lbs.   He returns today for HF follow up. Last visit lasix restarted 2x/week. ASA was restarted and imdur was added due to CP. He was given a scale and referred to HF paramedicine. Overall doing poorly. He continues to have CP that comes and goes. Does not occur with activity. No associated symptoms. It is left sided CP that lasts 20-30 minutes and occurs 2-3 times/day. He had an episode 1 week ago that was very severe. He has not tried taking nitro for the pain. Denies edema, orthopnea, or PND. He is SOB with ADLs. Denies dizziness or cough. He says he is no longer smoking, but smells like smoke. Poor appetite. Taking all medications. Has pill packs. Followed by HF paramedicine. SBP 120s on paramedicine checks.  Echo 10/13/18: EF 55-65%, grade 2 DD, mod MR TEE 10/07/18: EF 35-40% severe MR no flail TTE 10/06/18: EF 40-45% inferolateral AK mod-sev MR  (echo 09/07/18 with EF 60-65%)  Cath 10/07/2018 LM: ok LAD: 50%p LCx: 90% ostial diffusely diseased throughout RCA: 40% LVEDP = 32 RA = 12 RV =  52/13 PA =37/26 PCWP = 26 Fick 7.0/3.34  Review of systems complete and found to be negative unless listed in HPI.   Past Medical History:  Diagnosis Date  . Anxiety   . Bipolar disorder ([place])   . Chronic back pain   . Depression   . Diabetes mellitus without complication ([place])   . Diabetic neuropathy ([place])   . DVT (deep venous thrombosis) ([place])   . Enlarged prostate   . Headache   . Hepatitis    Hepatitis C - has been treated with Harvoni (cleared in July, 2017)  . History of colon polyps   . Peripheral vascular disease ([place])   . Pneumonia    hx of-about 29yrs ago  . Rhabdomyolysis 08/30/2018  . Schizophrenia ([place])   . Spinal stenosis     Current Outpatient Medications  Medication Sig Dispense Refill  . aspirin EC 81 MG tablet Take 1 tablet (81 mg total) by mouth daily. 30 tablet 3  . atorvastatin (LIPITOR) 40 MG tablet Take 1 tablet (40 mg total) by mouth every morning. 30 tablet 2  . benztropine (COGENTIN) 1 MG tablet Take 1 mg by mouth 2 (two) times daily.    . cholecalciferol (VITAMIN D) 1000 units tablet Take 1,000 Units by mouth daily.    . clopidogrel (PLAVIX) 75 MG tablet Take 1 tablet (75 mg total) by mouth daily. 30 tablet 2  . DULoxetine (CYMBALTA) 60 MG capsule Take 60 mg by mouth 2 (two) times daily.    . finasteride (PROSCAR) 5 MG tablet Take 5 mg by mouth  daily.     . furosemide (LASIX) 40 MG tablet Take 1 tablet (40 mg total) by mouth daily. Tuesday and Friday 8 tablet 3  . gabapentin (NEURONTIN) 300 MG capsule Take 1 capsule (300 mg total) by mouth at bedtime. (Patient taking differently: Take 300 mg by mouth 3 (three) times daily. ) 30 capsule 3  . INVEGA SUSTENNA 234 MG/1.5ML SUSP injection Inject 234 mg into the muscle every 30 (thirty) days.     . isosorbide mononitrate (IMDUR) 30 MG 24 hr tablet Take 1 tablet (30 mg total) by mouth daily. 30 tablet 6  . losartan (COZAAR) 25 MG tablet Take 0.5 tablets (12.5 mg total) by mouth every morning. 30 tablet 0    . Magnesium Oxide 200 MG TABS Take 2 tablets (400 mg total) by mouth daily. 60 tablet 11  . metFORMIN (GLUCOPHAGE) 1000 MG tablet Take 1,000 mg by mouth 2 (two) times daily with a meal.     . polyethylene glycol (MIRALAX / GLYCOLAX) packet Take 17 g by mouth daily. 14 each 0  . sitaGLIPtin (JANUVIA) 100 MG tablet Take 100 mg by mouth daily with breakfast.    . spironolactone (ALDACTONE) 25 MG tablet Take 1 tablet (25 mg total) by mouth daily. 30 tablet 2  . tamsulosin (FLOMAX) 0.4 MG CAPS capsule Take 0.4 mg by mouth daily after supper.     . traZODone (DESYREL) 150 MG tablet Take 0.5 tablets (75 mg total) by mouth at bedtime. (Patient taking differently: Take 300 mg by mouth at bedtime. ) 15 tablet 0   No current facility-administered medications for this encounter.     Allergies  Allergen Reactions  . Penicillins Hives and Other (See Comments)    Has patient had a PCN reaction causing immediate rash, facial/tongue/throat swelling, SOB or lightheadedness with hypotension: No Has patient had a PCN reaction causing severe rash involving mucus membranes or skin necrosis: No Has patient had a PCN reaction that required hospitalization No Has patient had a PCN reaction occurring within the last 10 years: No If all of the above answers are "NO", then may proceed with Cephalosporin use.      Social History   Socioeconomic History  . Marital status: Single    Spouse name: Not on file  . Number of children: Not on file  . Years of education: Not on file  . Highest education level: Not on file  Occupational History  . Occupation: disabled  Social Needs  . Financial resource strain: Not on file  . Food insecurity:    Worry: Not on file    Inability: Not on file  . Transportation needs:    Medical: Not on file    Non-medical: Not on file  Tobacco Use  . Smoking status: Current Every Day Smoker    Packs/day: 1.00    Years: 40.00    Pack years: 40.00    Types: Cigarettes  .  Smokeless tobacco: Never Used  Substance and Sexual Activity  . Alcohol use: Yes    Comment: occasional use  . Drug use: Yes    Types: Cocaine, Marijuana  . Sexual activity: Yes  Lifestyle  . Physical activity:    Days per week: Not on file    Minutes per session: Not on file  . Stress: Not on file  Relationships  . Social connections:    Talks on phone: Not on file    Gets together: Not on file    Attends religious service: Not on  file    Active member of club or organization: Not on file    Attends meetings of clubs or organizations: Not on file    Relationship status: Not on file  . Intimate partner violence:    Fear of current or ex partner: Not on file    Emotionally abused: Not on file    Physically abused: Not on file    Forced sexual activity: Not on file  Other Topics Concern  . Not on file  Social History Narrative  . Not on file      Family History  Problem Relation Age of Onset  . Heart attack Father   . Anesthesia problems Neg Hx   . Hypotension Neg Hx   . Malignant hyperthermia Neg Hx   . Pseudochol deficiency Neg Hx     Vitals:   12/05/18 1213  BP: 140/80  Pulse: 98  SpO2: 97%  Weight: 85.7 kg (189 lb)   Wt Readings from Last 3 Encounters:  12/05/18 85.7 kg (189 lb)  10/25/18 82.6 kg (182 lb 3.2 oz)  10/15/18 78.8 kg (173 lb 11.2 oz)    PHYSICAL EXAM: General: No resp difficulty. Appears fatigued and disheveled. Arrived in wheelchair.  HEENT: Normal Neck: Supple. JVP 5-6. Carotids 2+ bilat; no bruits. No thyromegaly or nodule noted. Cor: PMI nondisplaced. RRR, slightly tachy, No M/G/R noted Lungs: CTAB, normal effort. Abdomen: Soft, non-tender, non-distended, no HSM. No bruits or masses. +BS  Extremities: No cyanosis, clubbing, or rash. R and LLE no edema.  Neuro: Alert & orientedx3, cranial nerves grossly intact. moves all 4 extremities w/o difficulty. Affect pleasant  EKG: NSR 99 bpm. TWI in III, aVF, V5, and V6. Poor r-wave  progression in V5 and V6. No acute ST changes. Reviewed by Dr Haroldine Laws.   ASSESSMENT & PLAN:   1. Chronic systolic CHF: TEE with EF 35-40% and severe central mitral regurgitation. Echo 10/13/18 with LVEF 55-60%, Moderate MR.  - Severe MR likely plays a role in CHF/respiratory failure. - Worsened NYHA class IIIb - Volume status looks okay on exam. Weight is up, but does not appear to be fluid related. ReDs clip 33% - Continue lasix 40 mg twice/week on Tues/Fri. - Continue spiro 25 mg daily - Continue losartan 12.5 mg daily.  - Start coreg 3.125 mg BID. - Continue paramedicine.   2. CAD: NSTEMI.  Severe diffuse disease in LCx system not amenable to PCI or CABG.  Medical management.  - Continue ASA 81, plavix, and atorvastatin 40 daily.  - He is having left sided CP on and off over the last week. Not associated with activity. No associated symptoms.  - EKG today shows NSR 99 bpm. Dr Haroldine Laws reviewed as well. Plan for medical management - LHC reviewed by Dr Haroldine Laws and would be difficult for PCI. If symptoms do not improve with medication adjustments, will need to reconsider PCI to left circ.  - Start coreg 3.125 mg BID.  - Increase imdur to 60 mg daily  3. Mitral regurgitation: Secondary, infarct-related (LCx territory). No papillary muscle rupture. Not surgical candidate.  Mitraclip may be consideration down the road but probable pure secondary MR makes difficult at this time. Expect to improve with diuresis.  - Echo 10/13/18 with LVEF 55-60%, Moderate MR. No change.   4. Polysubstance abuse: Long-standing.  - Denies current smoking. No recent ETOH or drug use.   5. Schizophrenia.  - Per PCP and psychiatrist.   6. Anemia - Denies bleeding.   7. NSVT  during admission - Mag low at last appointment 1.5. Started on supp. Recheck BMET and mag today.  8. HTN - Start coreg as above.   BMET and mag today. Med changes as above. Will follow up closely in 2 weeks. If he continues to  have CP, may have to repeat LHC and reconsider trying to stent left circ. Pt instructed to let us know if CP gets worse or if he has another episode of severe CP. Reviewed all of the above with Dr Haroldine Laws.   Georgiana Shore, NP 12/05/18  Patient seen and examined with the above-signed Advanced Practice Provider and/or Housestaff. I personally reviewed laboratory data, imaging studies and relevant notes. I independently examined the patient and formulated the important aspects of the plan. I have edited the note to reflect any of my changes or salient points. I have personally discussed the plan with the patient and/or family.  He is having CP that is concerning for angina. I have reviewed cath films personally and PCI options are very limited due to diffuse disease and small vessels in LCx territory. That said if CP is not amenable to medical therapy would recommend repeat cath to see if there was a way to address this. Will increase Imdur and add b-blocker. If symptoms persist will refer tfor cath. Knows to call 911 if CP persists or he has a sever episode. Volume status ok.   Total time personally spent 35 minutes. Over half that time spent discussing above.   Glori Bickers, MD  5:16 AM

## 2018-12-05 ENCOUNTER — Telehealth (HOSPITAL_COMMUNITY): Payer: Self-pay

## 2018-12-05 ENCOUNTER — Ambulatory Visit (HOSPITAL_COMMUNITY)
Admission: RE | Admit: 2018-12-05 | Discharge: 2018-12-05 | Disposition: A | Discharge status: Home / Self Care | Payer: Medicare Other | Source: Ambulatory Visit | Attending: Internal Medicine | Admitting: Internal Medicine

## 2018-12-05 ENCOUNTER — Other Ambulatory Visit: Payer: Self-pay

## 2018-12-05 ENCOUNTER — Other Ambulatory Visit (HOSPITAL_COMMUNITY): Payer: Self-pay

## 2018-12-05 VITALS — BP 140/80 | HR 98 | Wt 189.0 lb

## 2018-12-05 DIAGNOSIS — I5023 Acute on chronic systolic (congestive) heart failure: Secondary | ICD-10-CM

## 2018-12-05 DIAGNOSIS — I340 Nonrheumatic mitral (valve) insufficiency: Secondary | ICD-10-CM

## 2018-12-05 DIAGNOSIS — I5032 Chronic diastolic (congestive) heart failure: Secondary | ICD-10-CM | POA: Diagnosis not present

## 2018-12-05 DIAGNOSIS — I25119 Atherosclerotic heart disease of native coronary artery with unspecified angina pectoris: Secondary | ICD-10-CM | POA: Diagnosis not present

## 2018-12-05 DIAGNOSIS — R9431 Abnormal electrocardiogram [ECG] [EKG]: Secondary | ICD-10-CM | POA: Insufficient documentation

## 2018-12-05 DIAGNOSIS — Z720 Tobacco use: Secondary | ICD-10-CM

## 2018-12-05 LAB — BASIC METABOLIC PANEL
ANION GAP: 10 (ref 5–15)
BUN: 5 mg/dL — ABNORMAL LOW (ref 8–23)
CHLORIDE: 104 mmol/L (ref 98–111)
CO2: 26 mmol/L (ref 22–32)
CREATININE: 1.03 mg/dL (ref 0.61–1.24)
Calcium: 9.6 mg/dL (ref 8.9–10.3)
GFR calc non Af Amer: >60 mL/min (ref >60)
Glucose, Bld: 162 mg/dL — ABNORMAL HIGH (ref 70–99)
POTASSIUM: 3.5 mmol/L (ref 3.5–5.1)
SODIUM: 140 mmol/L (ref 135–145)

## 2018-12-05 LAB — MAGNESIUM: MAGNESIUM: 1.9 mg/dL (ref 1.7–2.4)

## 2018-12-05 MED ORDER — CARVEDILOL 3.125 MG PO TABS: 3.1250 mg | ORAL_TABLET | Freq: Two times a day (BID) | ORAL | 11 refills | Status: DC

## 2018-12-05 MED ORDER — ISOSORBIDE MONONITRATE ER 60 MG PO TB24: 60.0000 mg | ORAL_TABLET | Freq: Every day | ORAL | 6 refills | Status: AC

## 2018-12-05 NOTE — Patient Instructions (Signed)
Increase IMDUR to 60mg  daily.  START Carvedilol 3.125mg  twice daily.  Routine lab work today. Will notify you of abnormal results  Follow up in 2 weeks.

## 2018-12-05 NOTE — Telephone Encounter (Signed)
I spoke to [name] at [facility] regarding pts med changes today. She reports the ACT team may be d/c him from their services soon. The nurse had been picking up his meds weekly and delivering it to him, the last time the nurse picked up his meds was 12/23, but [name] said that may not be when she took it to him. He reports that he has a week left of his bubble pack, did not bring his bubble pack to clinic visit today.  [name] gave me the number to ACT team, I called and left VM for them to call me back to coordinate care. [name] said he owes $400 to them but they have a contract with ACT team to cover his meds.  When he is d/c from ACT team she wants to assist him in getting his co-pay covered but we need to get his SS benefit letter and list of bills and get this info to her.  As far as his meds this week, she will get his a week supply in bubble pack and I will pick it up tomor and take it to him.   [name], [place] 12/05/18

## 2018-12-05 NOTE — Progress Notes (Signed)
CSW met briefly with patient and Film/video editor. Patient struggling with ADL's and ambulation and asking for assistance at home. CSW will complete a referral for PCS services through [facility] and provided patient with information on Medicaid transportation. Patient grateful for support and will call for transport needs. CSW will complete referral and follow up with patient as needed. Raquel Sarna, [place], [place]

## 2018-12-05 NOTE — Progress Notes (Signed)
Paramedicine Encounter   Patient ID: KARANDEEP RESENDE , male,   DOB: [birth year],62 y.o.,  MRN: [id number]   Met patient in clinic today with provider.   Weight yesterday @ home-188 B/P-140/80 P-98 SP02-97% Weight @ clinic-189 REDS clip-33%  Pt states he feels so-so. Pt reports increased sob. Intermeittent cp is still going on. He denies any diaphoresis or numbness/tingling, increased sob during these events. He did not take his meds this morning. He is not having cp at the moment.  He reports he has not gotten his meds yet-he states he may have enough for a week. He did not bring his meds with him today.  Pt still wants a home health aide.  He states he does weigh daily, weights are 188-189. He has not seen much fluctuation in that. His weight is up 7lbs from last clinic visit.  He states his nurse was out last week.  He is getting an EKG today-he states cp last about 20-73mn at a time several times a day.  His imdur is being increased to 652mdaily and his carvedilol is being added to 3.12566mID.  Pt states they are now being mailed to him. Will call pharmacy to verify that they got med change and it will be sent out appropriately.   KatMarylouise StacksMTDiablo GrandemSt Petersburg General Hospitalramedic  12/05/18

## 2018-12-06 ENCOUNTER — Other Ambulatory Visit (HOSPITAL_COMMUNITY): Payer: Self-pay | Admitting: Internal Medicine

## 2018-12-06 ENCOUNTER — Other Ambulatory Visit (HOSPITAL_COMMUNITY): Payer: Self-pay

## 2018-12-06 NOTE — Progress Notes (Signed)
I went by [place] to pick up pt pill packs. I was given 2 weeks worth of meds. I delivered them to him, we called ACT team and left a message for a return call so we can coordinate care.  Marylouise Stacks, [place] 12/06/18

## 2018-12-07 ENCOUNTER — Telehealth (HOSPITAL_COMMUNITY): Payer: Self-pay

## 2018-12-07 NOTE — Telephone Encounter (Signed)
A member from the ACT team called me back regarding message I left about [name] and his status of care. He has been moved from ACT team to a CST--community support team. It is not as involved like ACT. She gave me 2 numbers to try to call to see if he still receives the medication benefits with the CST as he did with ACT.  Ms. [name] [name] [phone] Will give one of these a call tomor.   Marylouise Stacks, [place] 12/07/18

## 2018-12-13 ENCOUNTER — Telehealth (HOSPITAL_COMMUNITY): Payer: Self-pay

## 2018-12-13 NOTE — Telephone Encounter (Signed)
Spoke to Ms. Little from the CST regarding pt, she was actually there with him when I called. She advised that she will look into his medication benefit with the CST and also she will see if she can get his SS benefit statement for his med assist program with [facility] if that's the direction we need to go with to help cover his co-pay costs.   Marylouise Stacks, EMT-Paramedic  12/13/18

## 2018-12-14 ENCOUNTER — Other Ambulatory Visit (HOSPITAL_COMMUNITY): Payer: Self-pay

## 2018-12-14 NOTE — Progress Notes (Signed)
Paramedicine Encounter    Patient ID: [name], male    DOB: [birth year], 63 y.o.   MRN: [id number]   Patient Care Team: Nolene Ebbs, MD as PCP - General (Internal Medicine) Jorge Ny, LCSW as Social Worker (Licensed Clinical Social Worker)  Patient Active Problem List   Diagnosis Date Noted  . Acute on chronic systolic CHF (congestive heart failure) ([place]) 10/16/2018  . Severe mitral regurgitation   . Non-STEMI (non-ST elevated myocardial infarction) ([place]) 10/05/2018  . RUQ pain   . Transaminitis   . Rhabdomyolysis 08/30/2018  . Tobacco dependence 03/24/2018  . Cocaine abuse ([place]) 03/24/2018  . Anemia due to blood loss 06/30/2017    Class: Acute  . Diabetes mellitus without complication ([place]) 10/62/6948    Class: Chronic  . Spinal stenosis of lumbar region 06/29/2017    Class: Chronic  . Cauda equina compression ([place]) 06/29/2017    Class: Acute  . Spondylolisthesis, lumbar region 06/29/2017    Class: Chronic  . History of lumbar spinal fusion 06/26/2017  . Myelopathy ([place]) 06/24/2017  . Low back pain 06/24/2017  . NPH (normal pressure hydrocephalus) ([place]) 10/30/2016  . Bilateral sciatica 09/01/2016  . Hydrocephalus in adult [facility]) 07/01/2016  . Shaking spells 06/01/2016  . Gait disturbance 04/10/2015  . Numbness 04/10/2015  . Lumbar radicular pain 04/10/2015  . Lumbar spondylosis 04/10/2015  . Bipolar I disorder with depression, severe ([employer]) 06/30/2014  . Suicidal ideations 06/30/2014  . Ulcer of lower limb, unspecified 02/20/2014    Current Outpatient Medications:  .  aspirin EC 81 MG tablet, Take 1 tablet (81 mg total) by mouth daily., Disp: 30 tablet, Rfl: 3 .  atorvastatin (LIPITOR) 40 MG tablet, Take 1 tablet (40 mg total) by mouth every morning., Disp: 30 tablet, Rfl: 2 .  benztropine (COGENTIN) 1 MG tablet, Take 1 mg by mouth 2 (two) times daily., Disp: , Rfl:  .  carvedilol (COREG) 3.125 MG tablet, Take 1 tablet (3.125 mg total) by mouth 2 (two)  times daily., Disp: 60 tablet, Rfl: 11 .  cholecalciferol (VITAMIN D) 1000 units tablet, Take 1,000 Units by mouth daily., Disp: , Rfl:  .  clopidogrel (PLAVIX) 75 MG tablet, Take 1 tablet (75 mg total) by mouth daily., Disp: 30 tablet, Rfl: 2 .  DULoxetine (CYMBALTA) 60 MG capsule, Take 60 mg by mouth 2 (two) times daily., Disp: , Rfl:  .  finasteride (PROSCAR) 5 MG tablet, Take 5 mg by mouth daily. , Disp: , Rfl:  .  furosemide (LASIX) 40 MG tablet, Take 1 tablet (40 mg total) by mouth daily. Tuesday and Friday, Disp: 8 tablet, Rfl: 3 .  gabapentin (NEURONTIN) 300 MG capsule, Take 1 capsule (300 mg total) by mouth at bedtime. (Patient taking differently: Take 300 mg by mouth 3 (three) times daily. ), Disp: 30 capsule, Rfl: 3 .  INVEGA SUSTENNA 234 MG/1.5ML SUSP injection, Inject 234 mg into the muscle every 30 (thirty) days. , Disp: , Rfl:  .  isosorbide mononitrate (IMDUR) 60 MG 24 hr tablet, Take 1 tablet (60 mg total) by mouth daily., Disp: 30 tablet, Rfl: 6 .  losartan (COZAAR) 25 MG tablet, TAKE 0.5 TABLETS (12.5 MG TOTAL) BY MOUTH EVERY EVENING., Disp: 15 tablet, Rfl: 11 .  Magnesium Oxide 200 MG TABS, Take 2 tablets (400 mg total) by mouth daily., Disp: 60 tablet, Rfl: 11 .  metFORMIN (GLUCOPHAGE) 1000 MG tablet, Take 1,000 mg by mouth 2 (two) times daily with a meal. , Disp: ,  Rfl:  .  polyethylene glycol (MIRALAX / GLYCOLAX) packet, Take 17 g by mouth daily., Disp: 14 each, Rfl: 0 .  sitaGLIPtin (JANUVIA) 100 MG tablet, Take 100 mg by mouth daily with breakfast., Disp: , Rfl:  .  spironolactone (ALDACTONE) 25 MG tablet, Take 1 tablet (25 mg total) by mouth daily., Disp: 30 tablet, Rfl: 2 .  tamsulosin (FLOMAX) 0.4 MG CAPS capsule, Take 0.4 mg by mouth daily after supper. , Disp: , Rfl:  .  traZODone (DESYREL) 150 MG tablet, Take 0.5 tablets (75 mg total) by mouth at bedtime. (Patient taking differently: Take 300 mg by mouth at bedtime. ), Disp: 15 tablet, Rfl: 0 Allergies  Allergen  Reactions  . Penicillins Hives and Other (See Comments)    Has patient had a PCN reaction causing immediate rash, facial/tongue/throat swelling, SOB or lightheadedness with hypotension: No Has patient had a PCN reaction causing severe rash involving mucus membranes or skin necrosis: No Has patient had a PCN reaction that required hospitalization No Has patient had a PCN reaction occurring within the last 10 years: No If all of the above answers are "NO", then may proceed with Cephalosporin use.      Social History   Socioeconomic History  . Marital status: Single    Spouse name: Not on file  . Number of children: Not on file  . Years of education: Not on file  . Highest education level: Not on file  Occupational History  . Occupation: disabled  Social Needs  . Financial resource strain: Not on file  . Food insecurity:    Worry: Not on file    Inability: Not on file  . Transportation needs:    Medical: Not on file    Non-medical: Not on file  Tobacco Use  . Smoking status: Current Every Day Smoker    Packs/day: 1.00    Years: 40.00    Pack years: 40.00    Types: Cigarettes  . Smokeless tobacco: Never Used  Substance and Sexual Activity  . Alcohol use: Yes    Comment: occasional use  . Drug use: Yes    Types: Cocaine, Marijuana  . Sexual activity: Yes  Lifestyle  . Physical activity:    Days per week: Not on file    Minutes per session: Not on file  . Stress: Not on file  Relationships  . Social connections:    Talks on phone: Not on file    Gets together: Not on file    Attends religious service: Not on file    Active member of club or organization: Not on file    Attends meetings of clubs or organizations: Not on file    Relationship status: Not on file  . Intimate partner violence:    Fear of current or ex partner: Not on file    Emotionally abused: Not on file    Physically abused: Not on file    Forced sexual activity: Not on file  Other Topics Concern   . Not on file  Social History Narrative  . Not on file    Physical Exam      Future Appointments  Date Time Provider [place]  12/19/2018 11:30 AM [facility] PA/NP [facility] None    BP (!) 142/72   Pulse 96   Resp 15   Wt 179 lb (81.2 kg)   SpO2 98%   BMI 24.97 kg/m   CBG EMS-212  Weight yesterday-179 Last visit weight-189  Pt reports doing ok, he  states since the med dose change at last visit has improved his c/p episodes. He denies increased sob. He does not have a glucometer to check his CBG.  He reports weight is maintaining at 179.  I spoke to pharmacist and she will pack his meds on Monday after his clinic appointment and I will go pick up a month worth of meds for him. He is involved in the community support team instead of the ACT. Not sure he will get same benefits of his meds from [facility] vs [facility]-working on getting his SS benefit statement so we can turn it in to pharmacist to see if he is eligible for low income to assist for co-pay.   Marylouise Stacks, [facility] Paramedic  12/15/18

## 2018-12-19 ENCOUNTER — Other Ambulatory Visit (HOSPITAL_COMMUNITY): Payer: Self-pay

## 2018-12-19 ENCOUNTER — Ambulatory Visit (HOSPITAL_COMMUNITY)
Admission: RE | Admit: 2018-12-19 | Discharge: 2018-12-19 | Disposition: A | Discharge status: Home / Self Care | Payer: Medicare Other | Source: Ambulatory Visit | Attending: Internal Medicine | Admitting: Internal Medicine

## 2018-12-19 ENCOUNTER — Encounter (HOSPITAL_COMMUNITY): Payer: Self-pay

## 2018-12-19 ENCOUNTER — Other Ambulatory Visit: Payer: Self-pay

## 2018-12-19 VITALS — BP 138/84 | HR 94 | Wt 188.6 lb

## 2018-12-19 DIAGNOSIS — Z720 Tobacco use: Secondary | ICD-10-CM | POA: Diagnosis not present

## 2018-12-19 DIAGNOSIS — Z79899 Other long term (current) drug therapy: Secondary | ICD-10-CM | POA: Insufficient documentation

## 2018-12-19 DIAGNOSIS — E1151 Type 2 diabetes mellitus with diabetic peripheral angiopathy without gangrene: Secondary | ICD-10-CM | POA: Insufficient documentation

## 2018-12-19 DIAGNOSIS — F319 Bipolar disorder, unspecified: Secondary | ICD-10-CM | POA: Diagnosis not present

## 2018-12-19 DIAGNOSIS — F209 Schizophrenia, unspecified: Secondary | ICD-10-CM | POA: Insufficient documentation

## 2018-12-19 DIAGNOSIS — F419 Anxiety disorder, unspecified: Secondary | ICD-10-CM | POA: Insufficient documentation

## 2018-12-19 DIAGNOSIS — I110 Hypertensive heart disease with heart failure: Secondary | ICD-10-CM | POA: Diagnosis not present

## 2018-12-19 DIAGNOSIS — I472 Ventricular tachycardia: Secondary | ICD-10-CM | POA: Insufficient documentation

## 2018-12-19 DIAGNOSIS — E1140 Type 2 diabetes mellitus with diabetic neuropathy, unspecified: Secondary | ICD-10-CM | POA: Insufficient documentation

## 2018-12-19 DIAGNOSIS — Z7982 Long term (current) use of aspirin: Secondary | ICD-10-CM | POA: Insufficient documentation

## 2018-12-19 DIAGNOSIS — D649 Anemia, unspecified: Secondary | ICD-10-CM | POA: Insufficient documentation

## 2018-12-19 DIAGNOSIS — Z880 Allergy status to penicillin: Secondary | ICD-10-CM | POA: Insufficient documentation

## 2018-12-19 DIAGNOSIS — Z7984 Long term (current) use of oral hypoglycemic drugs: Secondary | ICD-10-CM | POA: Diagnosis not present

## 2018-12-19 DIAGNOSIS — I2510 Atherosclerotic heart disease of native coronary artery without angina pectoris: Secondary | ICD-10-CM | POA: Diagnosis not present

## 2018-12-19 DIAGNOSIS — F1910 Other psychoactive substance abuse, uncomplicated: Secondary | ICD-10-CM | POA: Diagnosis not present

## 2018-12-19 DIAGNOSIS — R002 Palpitations: Secondary | ICD-10-CM

## 2018-12-19 DIAGNOSIS — I5022 Chronic systolic (congestive) heart failure: Secondary | ICD-10-CM

## 2018-12-19 DIAGNOSIS — Z7902 Long term (current) use of antithrombotics/antiplatelets: Secondary | ICD-10-CM | POA: Diagnosis not present

## 2018-12-19 DIAGNOSIS — I214 Non-ST elevation (NSTEMI) myocardial infarction: Secondary | ICD-10-CM | POA: Insufficient documentation

## 2018-12-19 DIAGNOSIS — I25119 Atherosclerotic heart disease of native coronary artery with unspecified angina pectoris: Secondary | ICD-10-CM | POA: Diagnosis not present

## 2018-12-19 DIAGNOSIS — N400 Enlarged prostate without lower urinary tract symptoms: Secondary | ICD-10-CM | POA: Diagnosis not present

## 2018-12-19 DIAGNOSIS — Z86718 Personal history of other venous thrombosis and embolism: Secondary | ICD-10-CM | POA: Diagnosis not present

## 2018-12-19 DIAGNOSIS — I340 Nonrheumatic mitral (valve) insufficiency: Secondary | ICD-10-CM | POA: Diagnosis not present

## 2018-12-19 DIAGNOSIS — Z87891 Personal history of nicotine dependence: Secondary | ICD-10-CM | POA: Insufficient documentation

## 2018-12-19 DIAGNOSIS — I208 Other forms of angina pectoris: Secondary | ICD-10-CM

## 2018-12-19 DIAGNOSIS — Z8249 Family history of ischemic heart disease and other diseases of the circulatory system: Secondary | ICD-10-CM | POA: Diagnosis not present

## 2018-12-19 LAB — BASIC METABOLIC PANEL
Anion gap: 11 (ref 5–15)
BUN: 10 mg/dL (ref 8–23)
CALCIUM: 9.7 mg/dL (ref 8.9–10.3)
CHLORIDE: 103 mmol/L (ref 98–111)
CO2: 24 mmol/L (ref 22–32)
CREATININE: 1.04 mg/dL (ref 0.61–1.24)
GFR calc non Af Amer: >60 mL/min (ref >60)
Glucose, Bld: 164 mg/dL — ABNORMAL HIGH (ref 70–99)
Potassium: 4 mmol/L (ref 3.5–5.1)
SODIUM: 138 mmol/L (ref 135–145)

## 2018-12-19 MED ORDER — CARVEDILOL 6.25 MG PO TABS: 3.1250 mg | ORAL_TABLET | Freq: Two times a day (BID) | ORAL | 11 refills | Status: DC

## 2018-12-19 NOTE — Patient Instructions (Signed)
INCREASE Coreg to 6.25 mg, one tab twice a day  Labs today We will only contact you if something comes back abnormal or we need to make some changes. Otherwise no news is good news!  You have been referred to [facility], they will be in contact with you to arrange orientation.  Your physician recommends that you schedule a follow-up appointment in: 4 weeks  in the Advanced Practitioners (PA/NP) Clinic     Do the following things EVERYDAY: 1) Weigh yourself in the morning before breakfast. Write it down and keep it in a log. 2) Take your medicines as prescribed 3) Eat low salt foods-Limit salt (sodium) to 2000 mg per day.  4) Stay as active as you can everyday 5) Limit all fluids for the day to less than 2 liters

## 2018-12-19 NOTE — Progress Notes (Signed)
Advanced Heart Failure Clinic Note   Referring Physician: PCP: Nolene Ebbs, MD PCP-Cardiologist: Dr Haroldine Laws  HPI: 63 y/o male with spinal stenosis, shizhophrenia, bipolar disorder, polysubstance abuse, PAD, h/o DVT, HCV s/p treatment with Harvoni, DM with diabetic neuropathy, and recent rhabdo (08/2018, secondary to cocaine use).   He was admitted on 10/05/18 with NSTEMI (Peak trop 26.2) and respiratory failure requiring intubation.  TEE initially showed EF 35-40% with severe central MR. LHC showed diffuse disease in LCx, not amenable to PCI or CABG. MR thought to be infarct-related in LCx territory. Not a surgical candidate. Diuresed with IV lasix. HF meds optimized. Did not require inotrope support. Repeat echo showed EF 55-60% with mod MR. HF team consulted. HF medications titrated. Treated for PNA. Course complicated by hypernatremia. Improved with D5W infusion. Lasix held at DC. DC weight: 173 lbs.   He presents today for 2 week follow up. Last visit, coreg added and imdur increased in setting of CP. Symptoms unchanged. He describes 7/10 chest pain that suddenly comes and goes 2-3 times a day regardless of activity. No clear relieving or aggravating factors. Has not taken NTG. Pain is central chest with ? Radiation to left chest. He has occasional SOB with ADLs. Denies dizziness or cough. States he has stopped smoking. Gets pills in bubble packs and has paramedicine.   Echo 10/13/18: EF 55-65%, grade 2 DD, mod MR TEE 10/07/18: EF 35-40% severe MR no flail TTE 10/06/18: EF 40-45% inferolateral AK mod-sev MR  (echo 09/07/18 with EF 60-65%)  Cath 10/07/2018 LM: ok LAD: 50%p LCx: 90% ostial diffusely diseased throughout RCA: 40% LVEDP = 32 RA = 12 RV = 52/13 PA =37/26 PCWP = 26 Fick 7.0/3.34  Review of systems complete and found to be negative unless listed in HPI.    Past Medical History:  Diagnosis Date  . Anxiety   . Bipolar disorder ([place])   . Chronic back pain   .  Depression   . Diabetes mellitus without complication ([place])   . Diabetic neuropathy ([place])   . DVT (deep venous thrombosis) ([place])   . Enlarged prostate   . Headache   . Hepatitis    Hepatitis C - has been treated with Harvoni (cleared in July, 2017)  . History of colon polyps   . Peripheral vascular disease ([place])   . Pneumonia    hx of-about 23yrs ago  . Rhabdomyolysis 08/30/2018  . Schizophrenia ([place])   . Spinal stenosis     Current Outpatient Medications  Medication Sig Dispense Refill  . aspirin EC 81 MG tablet Take 1 tablet (81 mg total) by mouth daily. 30 tablet 3  . atorvastatin (LIPITOR) 40 MG tablet Take 1 tablet (40 mg total) by mouth every morning. 30 tablet 2  . benztropine (COGENTIN) 1 MG tablet Take 1 mg by mouth 2 (two) times daily.    . carvedilol (COREG) 3.125 MG tablet Take 1 tablet (3.125 mg total) by mouth 2 (two) times daily. 60 tablet 11  . cholecalciferol (VITAMIN D) 1000 units tablet Take 1,000 Units by mouth daily.    . clopidogrel (PLAVIX) 75 MG tablet Take 1 tablet (75 mg total) by mouth daily. 30 tablet 2  . DULoxetine (CYMBALTA) 60 MG capsule Take 60 mg by mouth 2 (two) times daily.    . finasteride (PROSCAR) 5 MG tablet Take 5 mg by mouth daily.     . furosemide (LASIX) 40 MG tablet Take 1 tablet (40 mg total) by mouth daily. Tuesday and  Friday 8 tablet 3  . gabapentin (NEURONTIN) 300 MG capsule Take 1 capsule (300 mg total) by mouth at bedtime. (Patient taking differently: Take 300 mg by mouth 3 (three) times daily. ) 30 capsule 3  . INVEGA SUSTENNA 234 MG/1.5ML SUSP injection Inject 234 mg into the muscle every 30 (thirty) days.     . isosorbide mononitrate (IMDUR) 60 MG 24 hr tablet Take 1 tablet (60 mg total) by mouth daily. 30 tablet 6  . losartan (COZAAR) 25 MG tablet TAKE 0.5 TABLETS (12.5 MG TOTAL) BY MOUTH EVERY EVENING. 15 tablet 11  . Magnesium Oxide 200 MG TABS Take 2 tablets (400 mg total) by mouth daily. 60 tablet 11  . metFORMIN (GLUCOPHAGE)  1000 MG tablet Take 1,000 mg by mouth 2 (two) times daily with a meal.     . polyethylene glycol (MIRALAX / GLYCOLAX) packet Take 17 g by mouth daily. 14 each 0  . sitaGLIPtin (JANUVIA) 100 MG tablet Take 100 mg by mouth daily with breakfast.    . spironolactone (ALDACTONE) 25 MG tablet Take 1 tablet (25 mg total) by mouth daily. 30 tablet 2  . tamsulosin (FLOMAX) 0.4 MG CAPS capsule Take 0.4 mg by mouth daily after supper.     . traZODone (DESYREL) 150 MG tablet Take 0.5 tablets (75 mg total) by mouth at bedtime. (Patient taking differently: Take 300 mg by mouth at bedtime. ) 15 tablet 0   No current facility-administered medications for this encounter.     Allergies  Allergen Reactions  . Penicillins Hives and Other (See Comments)    Has patient had a PCN reaction causing immediate rash, facial/tongue/throat swelling, SOB or lightheadedness with hypotension: No Has patient had a PCN reaction causing severe rash involving mucus membranes or skin necrosis: No Has patient had a PCN reaction that required hospitalization No Has patient had a PCN reaction occurring within the last 10 years: No If all of the above answers are "NO", then may proceed with Cephalosporin use.      Social History   Socioeconomic History  . Marital status: Single    Spouse name: Not on file  . Number of children: Not on file  . Years of education: Not on file  . Highest education level: Not on file  Occupational History  . Occupation: disabled  Social Needs  . Financial resource strain: Not on file  . Food insecurity:    Worry: Not on file    Inability: Not on file  . Transportation needs:    Medical: Not on file    Non-medical: Not on file  Tobacco Use  . Smoking status: Former Smoker    Packs/day: 1.00    Years: 40.00    Pack years: 40.00    Types: Cigarettes    Last attempt to quit: 11/30/2018    Years since quitting: 0.0  . Smokeless tobacco: Never Used  Substance and Sexual Activity  .  Alcohol use: Yes    Comment: occasional use  . Drug use: Yes    Types: Cocaine, Marijuana  . Sexual activity: Yes  Lifestyle  . Physical activity:    Days per week: Not on file    Minutes per session: Not on file  . Stress: Not on file  Relationships  . Social connections:    Talks on phone: Not on file    Gets together: Not on file    Attends religious service: Not on file    Active member of club or organization:  Not on file    Attends meetings of clubs or organizations: Not on file    Relationship status: Not on file  . Intimate partner violence:    Fear of current or ex partner: Not on file    Emotionally abused: Not on file    Physically abused: Not on file    Forced sexual activity: Not on file  Other Topics Concern  . Not on file  Social History Narrative  . Not on file      Family History  Problem Relation Age of Onset  . Heart attack Father   . Anesthesia problems Neg Hx   . Hypotension Neg Hx   . Malignant hyperthermia Neg Hx   . Pseudochol deficiency Neg Hx     Vitals:   12/19/18 1110  BP: 138/84  Pulse: 94  SpO2: 98%  Weight: 85.5 kg (188 lb 9.6 oz)   Wt Readings from Last 3 Encounters:  12/19/18 85.5 kg (188 lb 9.6 oz)  12/14/18 81.2 kg (179 lb)  12/05/18 85.7 kg (189 lb)   Physical Exam General: NAD  HEENT: Normal Neck: Supple. JVP 6-7 cm. Carotids 2+ bilat; no bruits. No thyromegaly or nodule noted. Cor: PMI nondisplaced. RRR, No M/G/R noted Lungs: CTAB, normal effort. Abdomen: Soft, non-tender, non-distended, no HSM. No bruits or masses. +BS  Extremities: No cyanosis, clubbing, or rash. R and LLE no edema.  Neuro: Alert & orientedx3, cranial nerves grossly intact. moves all 4 extremities w/o difficulty. Affect pleasant  EKG: NSR 87 bpm, No ST/T changes personally reviewed  ASSESSMENT & PLAN:  1. Chronic systolic CHF: TEE with EF 35-40% and severe central mitral regurgitation. Echo 10/13/18 with LVEF 55-60%, Moderate MR.  - Severe MR  likely plays a role in CHF/respiratory failure. - NYHA class III-IIIb.  - Volume status looks OK on exam.  - Continue lasix 40 mg twice/week on Tues/Fri. - Continue spiro 25 mg daily - Continue losartan 12.5 mg daily.  - Increase coreg to 6.25 mg BID.  - Continue paramedicine.   2. CAD: NSTEMI.  Severe diffuse disease in LCx system not amenable to PCI or CABG.  Medical management.  - Continue ASA 81, plavix, and atorvastatin 40 daily.  - He has continued CP, but relatively stable. Discussed with Dr. Haroldine Laws. With no target for PCI or CABG, continue medical treatment for now.  - Refer to cardiac rehab - Increase BB as above.  - Reviewed worsening symptoms and reasons to report to ED.  - Continue imdur 60 mg daily  3. Mitral regurgitation: Secondary, infarct-related (LCx territory). No papillary muscle rupture. Not surgical candidate.  Mitraclip may be consideration down the road but probable pure secondary MR makes difficult at this time. Expect to improve with diuresis.  - Echo 10/13/18 with LVEF 55-60%, Moderate MR. No change.     4. Polysubstance abuse: Long-standing.  - Denies current smoking. No recent ETOH or drug use. No change.   5. Schizophrenia.  - Per PCP and psychiatrist. No change.   6. Anemia - No overt bleeding.    7. NSVT during admission - Mg stable 12/05/2018. - BMET today.   8. HTN - Meds as above.    Relatively stable, with continued atypical, but stable angina. Follow on meds as above per Dr. Haroldine Laws. RTC 4 weeks, sooner with symptoms. To ED with any worsening or changes in his CP.   Shirley Friar, PA-C 12/19/18   Greater than 50% of the 30 minute visit was spent  in counseling/coordination of care regarding disease state education, salt/fluid restriction, sliding scale diuretics, and medication compliance.

## 2018-12-19 NOTE — Progress Notes (Signed)
Paramedicine Encounter   Patient ID: [name] , male,   DOB: 26-Nov-1956,62 y.o.,  MRN: [id number]   Met patient in clinic today with provider.  Pt reports c/p still there, comes and goes, same description as before-more med adjustments today. I contacted the pharmacist from his pharmacy--office is closed today but she will get the bubble packs ready for tomor p/u and I will go get them for him and take them to him.  He was also referred out to cardiac rehab.  B/P-138/84 P-[id number] Weight @ [place], [place] 12/19/2018   ACTION: Home visit completed

## 2018-12-20 ENCOUNTER — Other Ambulatory Visit (HOSPITAL_COMMUNITY): Payer: Self-pay

## 2018-12-20 MED ORDER — CARVEDILOL 6.25 MG PO TABS: 6.2500 mg | ORAL_TABLET | Freq: Two times a day (BID) | ORAL | 11 refills | Status: AC

## 2018-12-21 ENCOUNTER — Telehealth (HOSPITAL_COMMUNITY): Payer: Self-pay | Admitting: Licensed Clinical Social Worker

## 2018-12-21 ENCOUNTER — Other Ambulatory Visit (HOSPITAL_COMMUNITY): Payer: Self-pay

## 2018-12-21 NOTE — Progress Notes (Signed)
Went by [facility] to pick pt meds up and delivered it to him. He now has bubble packs for a month with the increased dose of carvedilol per his most recent clinic visit.   Marylouise Stacks, EMT-Paramedic  12/21/18

## 2018-12-21 NOTE — Telephone Encounter (Signed)
Community paramedice requested assistance from CSW in obtaining pt disability awards letter from [employer] to assist in pt getting medications through [place] called social security to request awards statement with pt on the line- they will mail out statement and anticipate it to arrive within 10 mailing days  CSW will continue to follow and assist as needed.  Jorge Ny, LCSW Clinical Social Worker Advanced [facility] Failure Clinic [phone]

## 2018-12-29 ENCOUNTER — Telehealth (HOSPITAL_COMMUNITY): Payer: Self-pay

## 2018-12-29 NOTE — Telephone Encounter (Signed)
Called pt to sch home visit, no answer, unable to leave message.    Marylouise Stacks, EMT-Paramedic  12/29/18

## 2018-12-31 DEATH — deceased

## 2019-01-12 ENCOUNTER — Telehealth (HOSPITAL_COMMUNITY): Payer: Self-pay

## 2019-01-12 NOTE — Telephone Encounter (Signed)
Received message from [place] about his refill request was denied due to him being deceased.  According to 911 records he was found DOA on 1/25 in his home.   Marylouise Stacks, EMT-Paramedic  01/12/19

## 2019-01-17 ENCOUNTER — Encounter (HOSPITAL_COMMUNITY): Payer: Medicare Other

## 2023-09-20 ENCOUNTER — Telehealth: Payer: Self-pay

## 2023-09-20 NOTE — Telephone Encounter (Signed)
Incorrect patient
